# Patient Record
Sex: Female | Born: 1960 | Race: White | Hispanic: No | Marital: Married | Smoking: Current every day smoker
Health system: Southern US, Community
[De-identification: ages and names within clinical notes are randomized; demographics above are authoritative.]

## PROBLEM LIST (undated history)

## (undated) DIAGNOSIS — J189 Pneumonia, unspecified organism: Secondary | ICD-10-CM

## (undated) DIAGNOSIS — E118 Type 2 diabetes mellitus with unspecified complications: Secondary | ICD-10-CM

## (undated) DIAGNOSIS — K7460 Unspecified cirrhosis of liver: Secondary | ICD-10-CM

## (undated) DIAGNOSIS — R7989 Other specified abnormal findings of blood chemistry: Secondary | ICD-10-CM

## (undated) DIAGNOSIS — E785 Hyperlipidemia, unspecified: Secondary | ICD-10-CM

## (undated) DIAGNOSIS — I10 Essential (primary) hypertension: Secondary | ICD-10-CM

## (undated) DIAGNOSIS — R945 Abnormal results of liver function studies: Secondary | ICD-10-CM

## (undated) DIAGNOSIS — J449 Chronic obstructive pulmonary disease, unspecified: Secondary | ICD-10-CM

## (undated) DIAGNOSIS — M5030 Other cervical disc degeneration, unspecified cervical region: Secondary | ICD-10-CM

## (undated) DIAGNOSIS — Z720 Tobacco use: Secondary | ICD-10-CM

## (undated) DIAGNOSIS — K760 Fatty (change of) liver, not elsewhere classified: Secondary | ICD-10-CM

## (undated) DIAGNOSIS — Z87442 Personal history of urinary calculi: Secondary | ICD-10-CM

## (undated) DIAGNOSIS — N189 Chronic kidney disease, unspecified: Secondary | ICD-10-CM

## (undated) DIAGNOSIS — R51 Headache: Secondary | ICD-10-CM

## (undated) DIAGNOSIS — I700 Atherosclerosis of aorta: Secondary | ICD-10-CM

## (undated) DIAGNOSIS — G5600 Carpal tunnel syndrome, unspecified upper limb: Secondary | ICD-10-CM

## (undated) DIAGNOSIS — R519 Headache, unspecified: Secondary | ICD-10-CM

## (undated) DIAGNOSIS — I779 Disorder of arteries and arterioles, unspecified: Secondary | ICD-10-CM

## (undated) DIAGNOSIS — G4730 Sleep apnea, unspecified: Secondary | ICD-10-CM

## (undated) DIAGNOSIS — G43909 Migraine, unspecified, not intractable, without status migrainosus: Secondary | ICD-10-CM

## (undated) DIAGNOSIS — I2510 Atherosclerotic heart disease of native coronary artery without angina pectoris: Secondary | ICD-10-CM

## (undated) DIAGNOSIS — R7301 Impaired fasting glucose: Secondary | ICD-10-CM

## (undated) DIAGNOSIS — K219 Gastro-esophageal reflux disease without esophagitis: Secondary | ICD-10-CM

## (undated) DIAGNOSIS — Z7982 Long term (current) use of aspirin: Secondary | ICD-10-CM

## (undated) DIAGNOSIS — E119 Type 2 diabetes mellitus without complications: Secondary | ICD-10-CM

## (undated) DIAGNOSIS — G4733 Obstructive sleep apnea (adult) (pediatric): Secondary | ICD-10-CM

## (undated) DIAGNOSIS — M1990 Unspecified osteoarthritis, unspecified site: Secondary | ICD-10-CM

## (undated) DIAGNOSIS — J45909 Unspecified asthma, uncomplicated: Secondary | ICD-10-CM

## (undated) HISTORY — DX: Tobacco use: Z72.0

## (undated) HISTORY — DX: Hyperlipidemia, unspecified: E78.5

## (undated) HISTORY — DX: Other specified abnormal findings of blood chemistry: R79.89

## (undated) HISTORY — PX: DILATION AND CURETTAGE OF UTERUS: SHX78

## (undated) HISTORY — DX: Chronic obstructive pulmonary disease, unspecified: J44.9

## (undated) HISTORY — PX: CARPAL TUNNEL RELEASE: SHX101

## (undated) HISTORY — DX: Impaired fasting glucose: R73.01

## (undated) HISTORY — DX: Type 2 diabetes mellitus without complications: E11.9

## (undated) HISTORY — DX: Carpal tunnel syndrome, unspecified upper limb: G56.00

## (undated) HISTORY — DX: Essential (primary) hypertension: I10

## (undated) HISTORY — PX: TONSILLECTOMY: SUR1361

## (undated) HISTORY — DX: Unspecified asthma, uncomplicated: J45.909

## (undated) HISTORY — DX: Abnormal results of liver function studies: R94.5

## (undated) HISTORY — PX: PALATE / UVULA BIOPSY / EXCISION: SUR128

## (undated) HISTORY — DX: Gastro-esophageal reflux disease without esophagitis: K21.9

## (undated) HISTORY — DX: Sleep apnea, unspecified: G47.30

---

## 2003-09-09 ENCOUNTER — Other Ambulatory Visit: Payer: Self-pay

## 2003-12-12 ENCOUNTER — Other Ambulatory Visit: Payer: Self-pay

## 2004-04-22 ENCOUNTER — Emergency Department: Payer: Self-pay | Admitting: Emergency Medicine

## 2004-07-31 ENCOUNTER — Emergency Department: Payer: Self-pay | Admitting: General Practice

## 2004-08-22 ENCOUNTER — Ambulatory Visit: Payer: Self-pay

## 2004-12-08 ENCOUNTER — Emergency Department: Payer: Self-pay | Admitting: Emergency Medicine

## 2005-01-08 ENCOUNTER — Ambulatory Visit: Payer: Self-pay

## 2005-08-10 ENCOUNTER — Other Ambulatory Visit: Payer: Self-pay

## 2005-08-10 ENCOUNTER — Emergency Department: Payer: Self-pay | Admitting: Emergency Medicine

## 2005-09-18 ENCOUNTER — Ambulatory Visit: Payer: Self-pay | Admitting: Gastroenterology

## 2005-10-01 ENCOUNTER — Ambulatory Visit: Payer: Self-pay | Admitting: Gastroenterology

## 2005-12-12 ENCOUNTER — Encounter: Payer: Self-pay | Admitting: *Deleted

## 2005-12-17 ENCOUNTER — Ambulatory Visit: Payer: Self-pay | Admitting: Specialist

## 2005-12-23 ENCOUNTER — Encounter: Payer: Self-pay | Admitting: *Deleted

## 2006-09-11 ENCOUNTER — Emergency Department: Payer: Self-pay | Admitting: Internal Medicine

## 2007-02-18 ENCOUNTER — Emergency Department: Payer: Self-pay | Admitting: Emergency Medicine

## 2007-02-19 ENCOUNTER — Other Ambulatory Visit: Payer: Self-pay

## 2007-10-25 ENCOUNTER — Emergency Department: Payer: Self-pay | Admitting: Emergency Medicine

## 2008-12-08 ENCOUNTER — Emergency Department: Payer: Self-pay | Admitting: Emergency Medicine

## 2009-09-05 ENCOUNTER — Ambulatory Visit: Payer: Self-pay | Admitting: Internal Medicine

## 2011-11-15 ENCOUNTER — Ambulatory Visit: Payer: Self-pay | Admitting: Family Medicine

## 2013-09-05 ENCOUNTER — Emergency Department: Payer: Self-pay | Admitting: Emergency Medicine

## 2013-12-21 ENCOUNTER — Emergency Department: Payer: Self-pay | Admitting: Emergency Medicine

## 2014-07-13 ENCOUNTER — Emergency Department: Payer: Self-pay | Admitting: Emergency Medicine

## 2014-09-17 ENCOUNTER — Emergency Department: Payer: Self-pay

## 2014-10-04 DIAGNOSIS — F17219 Nicotine dependence, cigarettes, with unspecified nicotine-induced disorders: Secondary | ICD-10-CM | POA: Insufficient documentation

## 2014-10-04 DIAGNOSIS — E785 Hyperlipidemia, unspecified: Secondary | ICD-10-CM | POA: Insufficient documentation

## 2014-10-04 DIAGNOSIS — F17200 Nicotine dependence, unspecified, uncomplicated: Secondary | ICD-10-CM

## 2014-10-04 DIAGNOSIS — G4730 Sleep apnea, unspecified: Secondary | ICD-10-CM | POA: Insufficient documentation

## 2014-10-04 DIAGNOSIS — J45909 Unspecified asthma, uncomplicated: Secondary | ICD-10-CM | POA: Insufficient documentation

## 2014-10-04 DIAGNOSIS — J449 Chronic obstructive pulmonary disease, unspecified: Secondary | ICD-10-CM | POA: Insufficient documentation

## 2014-10-04 DIAGNOSIS — R7301 Impaired fasting glucose: Secondary | ICD-10-CM | POA: Insufficient documentation

## 2014-10-04 DIAGNOSIS — K219 Gastro-esophageal reflux disease without esophagitis: Secondary | ICD-10-CM | POA: Insufficient documentation

## 2014-10-04 DIAGNOSIS — I10 Essential (primary) hypertension: Secondary | ICD-10-CM | POA: Insufficient documentation

## 2014-10-15 ENCOUNTER — Ambulatory Visit
Admit: 2014-10-15 | Disposition: A | Discharge status: Home / Self Care | Payer: Self-pay | Attending: Gastroenterology | Admitting: Gastroenterology

## 2014-10-15 LAB — HM COLONOSCOPY

## 2014-10-20 ENCOUNTER — Ambulatory Visit (INDEPENDENT_AMBULATORY_CARE_PROVIDER_SITE_OTHER): Payer: 59 | Admitting: Cardiovascular Disease

## 2014-10-20 ENCOUNTER — Encounter (INDEPENDENT_AMBULATORY_CARE_PROVIDER_SITE_OTHER): Payer: Self-pay

## 2014-10-20 ENCOUNTER — Encounter: Payer: Self-pay | Admitting: Cardiovascular Disease

## 2014-10-20 VITALS — BP 120/88 | HR 72 | Ht 62.0 in | Wt 189.8 lb

## 2014-10-20 DIAGNOSIS — R0602 Shortness of breath: Secondary | ICD-10-CM | POA: Diagnosis not present

## 2014-10-20 DIAGNOSIS — G4730 Sleep apnea, unspecified: Secondary | ICD-10-CM

## 2014-10-20 DIAGNOSIS — I10 Essential (primary) hypertension: Secondary | ICD-10-CM | POA: Diagnosis not present

## 2014-10-20 DIAGNOSIS — J438 Other emphysema: Secondary | ICD-10-CM | POA: Diagnosis not present

## 2014-10-20 DIAGNOSIS — R079 Chest pain, unspecified: Secondary | ICD-10-CM | POA: Diagnosis not present

## 2014-10-20 DIAGNOSIS — K219 Gastro-esophageal reflux disease without esophagitis: Secondary | ICD-10-CM

## 2014-10-20 DIAGNOSIS — Z720 Tobacco use: Secondary | ICD-10-CM

## 2014-10-20 DIAGNOSIS — R7301 Impaired fasting glucose: Secondary | ICD-10-CM

## 2014-10-20 NOTE — Assessment & Plan Note (Signed)
She reports stable shortness of breath, only mild symptoms. Chronic cough

## 2014-10-20 NOTE — Assessment & Plan Note (Signed)
She reports having significant snoring, daytime somnolence, fatigue in the morning. Currently CPAP is not covered in she reports that primary care is working on her paperwork. This will likely help her fatigue

## 2014-10-20 NOTE — Assessment & Plan Note (Signed)
Recommended that she add Pepcid or Zantac in addition to her occasional Tums for breakthrough GERD symptoms. She is on Protonix daily

## 2014-10-20 NOTE — Assessment & Plan Note (Signed)
Recommended smoking cessation. She will consider Chantix

## 2014-10-20 NOTE — Assessment & Plan Note (Signed)
Atypical chest pain. She denies any significant pain with exertion. She feels her chest discomfort is secondary to breakthrough GERD symptoms. We spent a long time talking about anginal symptoms and what to watch for. She will contact us if she has any change in her stamina or if she gets exertional symptoms

## 2014-10-20 NOTE — Assessment & Plan Note (Signed)
Blood pressure is well controlled on today's visit. No changes made to the medications. 

## 2014-10-20 NOTE — Assessment & Plan Note (Signed)
We have encouraged continued exercise, careful diet management in an effort to lose weight. 

## 2014-10-20 NOTE — Progress Notes (Signed)
Patient ID: Monica Hull, female    DOB: 1961-06-17, 54 y.o.   MRN: [id number]  HPI Comments: Monica Hull is a 54 year old woman with history of smoking, GERD, COPD, hypertension who presents for symptoms of chest pain and syncope.  She reports that since 2000, she has had rare episodes of syncope. Symptoms typically present with nausea of uncertain etiology, followed by lightheadedness and syncope. Husband presents with her today and reports that she typically passes out for 5-10 minutes at a time. Lips go blue, she is limp. Eventually she comes to. She denies any significant workup for symptoms in the past. Each of these episodes typically preceded by nausea. She denies any vomiting. Last episode was several months ago.  Also has breakthrough GERD symptoms. Previously was on Prevacid, changed to [employer] for insurance reasons. On this regimen she continues to have breakthrough reflux. Sometimes takes extra Tums. Symptoms present at rest, feel like acid. She reports having recent EGD.  "They did not find anything"  In general she is able to walk fast without symptoms of shortness of breath or chest pain.  Husband reports that she "out-walks him "  She continues to smoke at least one pack per day. She has tried Wellbutrin, considering Chantix, tried nicotine patches  EKG on today's visit shows normal sinus rhythm with rate 72 bpm, no significant ST or T-wave changes   Allergies  Allergen Reactions  . Biaxin [Clarithromycin] Nausea And Vomiting and Rash    Outpatient Encounter Prescriptions as of 10/20/2014  Medication Sig  . diclofenac (VOLTAREN) 75 MG EC tablet Take 75 mg by mouth 2 (two) times daily as needed.  Marland Kitchen lisinopril (PRINIVIL,ZESTRIL) 5 MG tablet Take 5 mg by mouth daily.  . pantoprazole (PROTONIX) 40 MG tablet Take 40 mg by mouth daily.  . [DISCONTINUED] buPROPion (WELLBUTRIN XL) 150 MG 24 hr tablet Take 150 mg by mouth 2 (two) times daily.    Past Medical History   Diagnosis Date  . Asthma   . GERD (gastroesophageal reflux disease)   . Hyperlipidemia   . Hypertension   . Impaired fasting glucose   . COPD (chronic obstructive pulmonary disease)   . Tobacco abuse   . Elevated liver function tests   . Carpal tunnel syndrome   . Sleep apnea     Not using CPAP- referred to sleep center    Past Surgical History  Procedure Laterality Date  . Cesarean section      X2  . Carpal tunnel release      Social History  reports that she has been smoking Cigarettes.  She has a 78 pack-year smoking history. She does not have any smokeless tobacco history on file. She reports that she drinks alcohol. She reports that she does not use illicit drugs.  Family History family history includes Alcohol abuse in her father; Arthritis in her mother; Asthma in her mother; Diabetes in her brother and mother; Heart disease in her mother; Hyperlipidemia in her father and mother; Hypertension in her father and mother; Kidney disease in her mother; Lung disease in her mother.   Review of Systems  Constitutional: Negative.   Respiratory: Positive for shortness of breath.   Cardiovascular: Positive for chest pain.  Gastrointestinal: Negative.   Musculoskeletal: Negative.   Skin: Negative.   Neurological: Negative.   Hematological: Negative.   Psychiatric/Behavioral: Negative.   All other systems reviewed and are negative.   BP 120/88 mmHg  Pulse 72  Ht  (1.575 m)  Wt 189 lb 12 oz (86.07 kg)  BMI 34.70 kg/m2  Physical Exam  Constitutional: She is oriented to person, place, and time. She appears well-developed and well-nourished.  HENT:  Head: Normocephalic.  Nose: Nose normal.  Mouth/Throat: Oropharynx is clear and moist.  Eyes: Conjunctivae are normal. Pupils are equal, round, and reactive to light.  Neck: Normal range of motion. Neck supple. No JVD present.  Cardiovascular: Normal rate, regular rhythm, S1 normal, S2 normal, normal heart sounds and  intact distal pulses.  Exam reveals no gallop and no friction rub.   No murmur heard. Pulmonary/Chest: Effort normal and breath sounds normal. No respiratory distress. She has no wheezes. She has no rales. She exhibits no tenderness.  Abdominal: Soft. Bowel sounds are normal. She exhibits no distension. There is no tenderness.  Musculoskeletal: Normal range of motion. She exhibits no edema or tenderness.  Lymphadenopathy:    She has no cervical adenopathy.  Neurological: She is alert and oriented to person, place, and time. Coordination normal.  Skin: Skin is warm and dry. No rash noted. No erythema.  Psychiatric: She has a normal mood and affect. Her behavior is normal. Judgment and thought content normal.    Assessment and Plan  Nursing note and vitals reviewed.

## 2014-10-20 NOTE — Patient Instructions (Signed)
You are doing well. No medication changes were made.  Please call for any chest pains or shortness of breath, especially with exertion  Try the ranitidine or famotidine 1 to 2 pills with tums for heartburn OK to take with protonix  When you get nausea, Lay flat on the ground, hydrate  Please call us if you have new issues that need to be addressed before your next appt.  Your physician wants you to follow-up in: 12 months.  You will receive a reminder letter in the mail two months in advance. If you don't receive a letter, please call our office to schedule the follow-up appointment.

## 2014-11-30 ENCOUNTER — Telehealth: Payer: Self-pay | Admitting: Family Medicine

## 2014-11-30 NOTE — Telephone Encounter (Signed)
That is usually provided by her dentist. If they are having issues getting it from the dentist, let me know.

## 2014-11-30 NOTE — Telephone Encounter (Signed)
Pt's husband called stated pt is having teeth removed wants to know if Amoxic and Clavuln can be called in so the pt can have the work on her teeth completed. [facility] [place]. Thanks.

## 2014-12-01 NOTE — Telephone Encounter (Signed)
I called all numbers in the patients chart. I was unable to get through on either one of these. Will try again

## 2014-12-09 ENCOUNTER — Ambulatory Visit: Payer: Self-pay | Admitting: Family Medicine

## 2014-12-21 ENCOUNTER — Ambulatory Visit (INDEPENDENT_AMBULATORY_CARE_PROVIDER_SITE_OTHER): Payer: 59 | Admitting: Family Medicine

## 2014-12-21 ENCOUNTER — Encounter: Payer: Self-pay | Admitting: Family Medicine

## 2014-12-21 VITALS — BP 137/85 | HR 85 | Temp 97.9°F | Ht 62.5 in | Wt 187.8 lb

## 2014-12-21 DIAGNOSIS — I10 Essential (primary) hypertension: Secondary | ICD-10-CM | POA: Diagnosis not present

## 2014-12-21 DIAGNOSIS — E785 Hyperlipidemia, unspecified: Secondary | ICD-10-CM | POA: Diagnosis not present

## 2014-12-21 DIAGNOSIS — Z0000 Encounter for general adult medical examination without abnormal findings: Secondary | ICD-10-CM

## 2014-12-21 DIAGNOSIS — Z23 Encounter for immunization: Secondary | ICD-10-CM

## 2014-12-21 DIAGNOSIS — N393 Stress incontinence (female) (male): Secondary | ICD-10-CM | POA: Diagnosis not present

## 2014-12-21 DIAGNOSIS — J438 Other emphysema: Secondary | ICD-10-CM | POA: Diagnosis not present

## 2014-12-21 DIAGNOSIS — Z720 Tobacco use: Secondary | ICD-10-CM

## 2014-12-21 DIAGNOSIS — G4730 Sleep apnea, unspecified: Secondary | ICD-10-CM | POA: Diagnosis not present

## 2014-12-21 DIAGNOSIS — J441 Chronic obstructive pulmonary disease with (acute) exacerbation: Secondary | ICD-10-CM | POA: Insufficient documentation

## 2014-12-21 DIAGNOSIS — R7301 Impaired fasting glucose: Secondary | ICD-10-CM

## 2014-12-21 DIAGNOSIS — K219 Gastro-esophageal reflux disease without esophagitis: Secondary | ICD-10-CM

## 2014-12-21 LAB — UA/M W/RFLX CULTURE, ROUTINE
BILIRUBIN UA: NEGATIVE
Glucose, UA: NEGATIVE
KETONES UA: NEGATIVE
LEUKOCYTES UA: NEGATIVE
Nitrite, UA: NEGATIVE
PH UA: 5.5 (ref 5.0–7.5)
PROTEIN UA: NEGATIVE
RBC, UA: NEGATIVE
Specific Gravity, UA: 1.02 (ref 1.005–1.030)
Urobilinogen, Ur: 1 mg/dL (ref 0.2–1.0)

## 2014-12-21 LAB — MICROALBUMIN, URINE WAIVED
Creatinine, Urine Waived: 200 mg/dL (ref 10–300)
Microalb, Ur Waived: 30 mg/L — ABNORMAL HIGH (ref 0–19)

## 2014-12-21 LAB — HM PAP SMEAR: HM PAP: NEGATIVE

## 2014-12-21 MED ORDER — PREDNISONE 10 MG PO TABS: ORAL_TABLET | ORAL | Status: DC

## 2014-12-21 MED ORDER — AZITHROMYCIN 250 MG PO TABS: ORAL_TABLET | ORAL | Status: DC

## 2014-12-21 NOTE — Assessment & Plan Note (Signed)
Under good control today. Continue current regimen. Continue to monitor. CMP and microalbumin checked today.

## 2014-12-21 NOTE — Assessment & Plan Note (Signed)
Wear mask. Follow up with sleep doctor.

## 2014-12-21 NOTE — Assessment & Plan Note (Signed)
A1c checked today. CMP checked today. Await results. Continue diet and exercise.

## 2014-12-21 NOTE — Assessment & Plan Note (Signed)
Likely due to post-menopausal driness. Referral to GYN made today. Work on [employer]. Keep bladder empty. Timed voiding. Continue to monitor.

## 2014-12-21 NOTE — Assessment & Plan Note (Signed)
Has not been able to get her chantix. Check with insurance. Continue to monitor.

## 2014-12-21 NOTE — Assessment & Plan Note (Signed)
In exacerbation today. Will treat with azithromycin and prednisone. Return for recheck on lungs in 2 weeks. Will need a spiro at that time.

## 2014-12-21 NOTE — Assessment & Plan Note (Signed)
Under fair control. Continue current regimen. Continue to monitor.  

## 2014-12-21 NOTE — Assessment & Plan Note (Signed)
Lipid panel checked today. Await results. Continue to monitor.

## 2014-12-21 NOTE — Progress Notes (Signed)
BP 137/85 mmHg  Pulse 85  Temp(Src) 97.9 F (36.6 C) (Oral)  Ht 5' 2.5" (1.588 m)  Wt 187 lb 12.8 oz (85.186 kg)  BMI 33.78 kg/m2  SpO2 98%   Subjective:    Patient ID: Ok Monica Hull, female    DOB: 1961/03/24, 54 y.o.   MRN: [id number]  HPI: Monica Hull is a 54 y.o. female presenting on 12/21/2014 for comprehensive medical examination. Current medical complaints include:  Cough for a couple of weeks Worst symptom: cough Fever: no Cough: yes Shortness of breath: yes Wheezing: yes Chest pain: yes, with cough Chest tightness: no Chest congestion: no Nasal congestion: no Runny nose: no Post nasal drip: no Sneezing: no Sore throat: no Swollen glands: no Sinus pressure: no Headache: no Face pain: no Toothache: no Ear pain: no  Ear pressure: no  Eyes red/itching:no Eye drainage/crusting: no  Vomiting: no Rash: no Fatigue: no Sick contacts: no Strep contacts: no  Context: stable Recurrent sinusitis: no Relief with OTC cold/cough medications: no  Treatments attempted: cough syrup   Urinary incontience- loses control with coughing and sneezing, no loss of control when she's sitting, will leak when she stands up Dysuria: no Urinary frequency: yes Urgency: yes Small volume voids: no Symptom severity: severe Urinary incontinence: yes Foul odor: no Hematuria: no Abdominal pain: no Back pain: no Suprapubic pain/pressure: yes Flank pain: no Fever:  no Vomiting: no  She currently lives with: Her husband Menopausal Symptoms: yes- hot flashes  Depression Screen done today and results listed below:  Depression screen [facility] 2/9 12/21/2014  Decreased Interest 1  Down, Depressed, Hopeless 0  PHQ - 2 Score 1    The patient does not have a history of falls. I did not complete a risk assessment for falls. A plan of care for falls was not documented.   Past Medical History:  Past Medical History  Diagnosis Date  . Asthma   . GERD (gastroesophageal  reflux disease)   . Hyperlipidemia   . Hypertension   . Impaired fasting glucose   . COPD (chronic obstructive pulmonary disease)   . Tobacco abuse   . Elevated liver function tests   . Carpal tunnel syndrome   . Sleep apnea     Not using CPAP- referred to sleep center  . Diabetes mellitus without complication     Surgical History:  Past Surgical History  Procedure Laterality Date  . Cesarean section      X2  . Carpal tunnel release      Medications:  Current Outpatient Prescriptions on File Prior to Visit  Medication Sig  . diclofenac (VOLTAREN) 75 MG EC tablet Take 75 mg by mouth 2 (two) times daily as needed.  Marland Kitchen lisinopril (PRINIVIL,ZESTRIL) 5 MG tablet Take 5 mg by mouth daily.  . pantoprazole (PROTONIX) 40 MG tablet Take 40 mg by mouth daily.  . varenicline (CHANTIX PAK) 0.5 MG X 11 & 1 MG X 42 tablet Take by mouth 2 (two) times daily. Take one 0.5 mg tablet by mouth once daily for 3 days, then increase to one 0.5 mg tablet twice daily for 4 days, then increase to one 1 mg tablet twice daily.  . varenicline (CHANTIX) 1 MG tablet Take 1 mg by mouth 2 (two) times daily.   No current facility-administered medications on file prior to visit.    Allergies:  Allergies  Allergen Reactions  . Biaxin [Clarithromycin] Nausea And Vomiting and Rash    Social History:  History  Social History  . Marital Status: Married    Spouse Name: N/A  . Number of Children: N/A  . Years of Education: N/A   Occupational History  . Not on file.   Social History Main Topics  . Smoking status: Current Every Day Smoker -- 2.00 packs/day for 39 years    Types: Cigarettes  . Smokeless tobacco: Never Used  . Alcohol Use: No  . Drug Use: No  . Sexual Activity:    Partners: Male   Other Topics Concern  . Not on file   Social History Narrative   History  Smoking status  . Current Every Day Smoker -- 2.00 packs/day for 39 years  . Types: Cigarettes  Smokeless tobacco  . Never  Used   History  Alcohol Use No    Family History:  Family History  Problem Relation Age of Onset  . Arthritis Mother   . Asthma Mother   . Diabetes Mother   . Heart disease Mother   . Hyperlipidemia Mother   . Hypertension Mother   . Kidney disease Mother   . Lung disease Mother   . Alcohol abuse Father   . Hypertension Father   . Hyperlipidemia Father   . Diabetes Brother     Past medical history, surgical history, medications, allergies, family history and social history reviewed with patient today and changes made to appropriate areas of the chart.   Review of Systems  Constitutional: Positive for weight loss and malaise/fatigue. Negative for fever, chills and diaphoresis.  HENT: Positive for congestion. Negative for ear discharge, ear pain, hearing loss, nosebleeds, sore throat and tinnitus.   Eyes: Negative for blurred vision, double vision, photophobia, pain, discharge and redness.  Respiratory: Positive for cough, shortness of breath and wheezing. Negative for hemoptysis, sputum production and stridor.   Cardiovascular: Positive for chest pain. Negative for palpitations, orthopnea, claudication, leg swelling and PND.  Gastrointestinal: Positive for heartburn, diarrhea, constipation and blood in stool. Negative for nausea, vomiting, abdominal pain and melena.       Had a colonoscopy within the year. [facility]   Genitourinary: Positive for urgency and frequency. Negative for dysuria, hematuria and flank pain.  Musculoskeletal: Positive for myalgias and joint pain. Negative for back pain, falls and neck pain.  Skin: Negative for itching and rash.       Has a new mole on her ankle  Neurological: Negative for dizziness, tingling, tremors, sensory change, speech change, focal weakness, seizures, loss of consciousness, weakness and headaches.  Endo/Heme/Allergies: Negative for environmental allergies and polydipsia. Does not bruise/bleed easily.   Psychiatric/Behavioral: Negative for depression, suicidal ideas, hallucinations, memory loss and substance abuse. The patient is not nervous/anxious and does not have insomnia.     All other ROS negative except what is listed above and in the HPI.      Objective:    BP 137/85 mmHg  Pulse 85  Temp(Src) 97.9 F (36.6 C) (Oral)  Ht 5' 2.5" (1.588 m)  Wt 187 lb 12.8 oz (85.186 kg)  BMI 33.78 kg/m2  SpO2 98%  Wt Readings from Last 3 Encounters:  12/21/14 187 lb 12.8 oz (85.186 kg)  11/18/14 190 lb (86.183 kg)  10/20/14 189 lb 12 oz (86.07 kg)    Physical Exam  Constitutional: She is oriented to person, place, and time. She appears well-developed and well-nourished. No distress.  HENT:  Head: Normocephalic and atraumatic.  Right Ear: Hearing and external ear normal.  Left Ear: Hearing  and external ear normal.  Nose: Nose normal.  Mouth/Throat: Oropharynx is clear and moist. No oropharyngeal exudate.  Eyes: Conjunctivae and lids are normal. Pupils are equal, round, and reactive to light. Right eye exhibits no discharge. Left eye exhibits no discharge. No scleral icterus.  Neck: Normal range of motion. Neck supple. No JVD present. No tracheal deviation present. No thyromegaly present.  Cardiovascular: Normal rate, regular rhythm, normal heart sounds and intact distal pulses.  Exam reveals no gallop and no friction rub.   No murmur heard. Pulmonary/Chest: Effort normal. No accessory muscle usage or stridor. No apnea, no tachypnea and no bradypnea. No respiratory distress. She has wheezes. She has rhonchi in the right upper field, the right middle field, the right lower field, the left upper field, the left middle field and the left lower field. She has no rales. Right breast exhibits no inverted nipple, no mass, no nipple discharge, no skin change and no tenderness. Left breast exhibits no inverted nipple, no mass, no nipple discharge, no skin change and no tenderness. Breasts are  symmetrical.  Abdominal: Soft. Bowel sounds are normal. She exhibits no distension and no mass. There is no tenderness. There is no rebound and no guarding.  Genitourinary: Vagina normal and uterus normal. No breast swelling, tenderness, discharge or bleeding. No labial fusion. There is no rash, tenderness, lesion or injury on the right labia. There is no rash, tenderness, lesion or injury on the left labia. Uterus is not deviated, not enlarged, not fixed and not tender. Cervix exhibits no motion tenderness, no discharge and no friability. Right adnexum displays no mass, no tenderness and no fullness. Left adnexum displays no mass, no tenderness and no fullness. No erythema, tenderness or bleeding in the vagina. No vaginal discharge found.  Musculoskeletal: Normal range of motion. She exhibits no edema or tenderness.  Neurological: She is alert and oriented to person, place, and time. She has normal reflexes. She displays normal reflexes. No cranial nerve deficit. She exhibits normal muscle tone. Coordination normal.  Skin: Skin is warm, dry and intact. No rash noted. She is not diaphoretic. No erythema. No pallor.  Psychiatric: She has a normal mood and affect. Her speech is normal and behavior is normal. Judgment and thought content normal. Cognition and memory are normal.  Nursing note and vitals reviewed.   No results found for this or any previous visit.    Assessment & Plan:   Problem List Items Addressed This Visit      Cardiovascular and Mediastinum   Hypertension    Under good control today. Continue current regimen. Continue to monitor. CMP and microalbumin checked today.       Relevant Orders   CBC With Differential/Platelet   Comprehensive metabolic panel   Hgb A1c w/o eAG   Lipid Panel w/o Chol/HDL Ratio   Microalbumin, Urine Waived   TSH   UA/M w/rflx Culture, Routine     Respiratory   COPD (chronic obstructive pulmonary disease)    In exacerbation today. Will treat with  azithromycin and prednisone. Return for recheck on lungs in 2 weeks. Will need a spiro at that time.       Relevant Medications   predniSONE (DELTASONE) 10 MG tablet   azithromycin (ZITHROMAX) 250 MG tablet   COPD exacerbation   Relevant Medications   predniSONE (DELTASONE) 10 MG tablet   azithromycin (ZITHROMAX) 250 MG tablet     Digestive   GERD (gastroesophageal reflux disease)    Under fair control. Continue current  regimen. Continue to monitor.       Relevant Orders   CBC With Differential/Platelet   Comprehensive metabolic panel   Hgb A1c w/o eAG   Lipid Panel w/o Chol/HDL Ratio   Microalbumin, Urine Waived   TSH   UA/M w/rflx Culture, Routine     Endocrine   Impaired fasting glucose    A1c checked today. CMP checked today. Await results. Continue diet and exercise.       Relevant Orders   CBC With Differential/Platelet   Comprehensive metabolic panel   Hgb A1c w/o eAG   Lipid Panel w/o Chol/HDL Ratio   Microalbumin, Urine Waived   TSH   UA/M w/rflx Culture, Routine     Other   Sleep apnea    Wear mask. Follow up with sleep doctor.       Hyperlipidemia    Lipid panel checked today. Await results. Continue to monitor.       Relevant Orders   CBC With Differential/Platelet   Comprehensive metabolic panel   Hgb A1c w/o eAG   Lipid Panel w/o Chol/HDL Ratio   Microalbumin, Urine Waived   TSH   UA/M w/rflx Culture, Routine   Tobacco abuse    Has not been able to get her chantix. Check with insurance. Continue to monitor.       Relevant Orders   CBC With Differential/Platelet   Comprehensive metabolic panel   Hgb A1c w/o eAG   Lipid Panel w/o Chol/HDL Ratio   Microalbumin, Urine Waived   TSH   UA/M w/rflx Culture, Routine   Stress incontinence    Likely due to post-menopausal driness. Referral to GYN made today. Work on [employer]. Keep bladder empty. Timed voiding. Continue to monitor.       Relevant Orders   Ambulatory referral to Gynecology     Other Visit Diagnoses    Routine general medical examination at a health care facility    -  Primary    Doing well. Mammo done. Conolonoscopy done. Up to date on vaccines. Screening labs checked today. Pap done today. Work on diet and exercise.     Relevant Orders    CBC With Differential/Platelet    Comprehensive metabolic panel    Hgb A1c w/o eAG    Lipid Panel w/o Chol/HDL Ratio    Microalbumin, Urine Waived    TSH    UA/M w/rflx Culture, Routine    Pap liquid-based and HPV (high risk)    Immunization due        Tdap given today.     Relevant Orders    Tdap vaccine greater than or equal to 7yo IM (Completed)        Follow up plan: Return in about 2 weeks (around 01/04/2015).   LABORATORY TESTING:  - Pap smear: pap done  IMMUNIZATIONS:   - Tdap: Tetanus vaccination status reviewed: Td vaccination indicated and given today. - Influenza: Postponed to flu season - Pneumovax: Up to date  SCREENING: -Mammogram: Up to date  - Colonoscopy: Up to date   PATIENT COUNSELING:   Advised to take 1 mg of folate supplement per day if capable of pregnancy.   Sexuality: Discussed sexually transmitted diseases, partner selection, use of condoms, avoidance of unintended pregnancy  and contraceptive alternatives.   Advised to avoid cigarette smoking.  I discussed with the patient that most people either abstain from alcohol or drink within safe limits (<=14/week and <=4 drinks/occasion for males, <=7/weeks and <= 3 drinks/occasion for females) and that  the risk for alcohol disorders and other health effects rises proportionally with the number of drinks per week and how often a drinker exceeds daily limits.  Discussed cessation/primary prevention of drug use and availability of treatment for abuse.   Diet: Encouraged to adjust caloric intake to maintain  or achieve ideal body weight, to reduce intake of dietary saturated fat and total fat, to limit sodium intake by avoiding high sodium  foods and not adding table salt, and to maintain adequate dietary potassium and calcium preferably from fresh fruits, vegetables, and low-fat dairy products.    stressed the importance of regular exercise  Injury prevention: Discussed safety belts, safety helmets, smoke detector, smoking near bedding or upholstery.   Dental health: Discussed importance of regular tooth brushing, flossing, and dental visits.    NEXT PREVENTATIVE PHYSICAL DUE IN 1 YEAR. Return in about 2 weeks (around 01/04/2015).

## 2014-12-22 ENCOUNTER — Encounter: Payer: Self-pay | Admitting: Family Medicine

## 2014-12-22 ENCOUNTER — Telehealth: Payer: Self-pay

## 2014-12-22 LAB — COMPREHENSIVE METABOLIC PANEL
A/G RATIO: 1.5 (ref 1.1–2.5)
ALT: 36 IU/L — AB (ref 0–32)
AST: 29 IU/L (ref 0–40)
Albumin: 4.1 g/dL (ref 3.5–5.5)
Alkaline Phosphatase: 125 IU/L — ABNORMAL HIGH (ref 39–117)
BUN/Creatinine Ratio: 14 (ref 9–23)
BUN: 9 mg/dL (ref 6–24)
Bilirubin Total: 0.3 mg/dL (ref 0.0–1.2)
CALCIUM: 9.3 mg/dL (ref 8.7–10.2)
CHLORIDE: 100 mmol/L (ref 97–108)
CO2: 26 mmol/L (ref 18–29)
Creatinine, Ser: 0.66 mg/dL (ref 0.57–1.00)
GFR calc Af Amer: 116 mL/min/{1.73_m2} (ref >59)
GFR, EST NON AFRICAN AMERICAN: 100 mL/min/{1.73_m2} (ref >59)
GLUCOSE: 103 mg/dL — AB (ref 65–99)
Globulin, Total: 2.8 g/dL (ref 1.5–4.5)
POTASSIUM: 4.3 mmol/L (ref 3.5–5.2)
SODIUM: 140 mmol/L (ref 134–144)
Total Protein: 6.9 g/dL (ref 6.0–8.5)

## 2014-12-22 LAB — TSH: TSH: 1.43 u[IU]/mL (ref 0.450–4.500)

## 2014-12-22 LAB — LIPID PANEL W/O CHOL/HDL RATIO
CHOLESTEROL TOTAL: 183 mg/dL (ref 100–199)
HDL: 35 mg/dL — ABNORMAL LOW (ref >39)
LDL CALC: 103 mg/dL — AB (ref 0–99)
Triglycerides: 223 mg/dL — ABNORMAL HIGH (ref 0–149)
VLDL CHOLESTEROL CAL: 45 mg/dL — AB (ref 5–40)

## 2014-12-22 LAB — HGB A1C W/O EAG: Hgb A1c MFr Bld: 5.8 % — ABNORMAL HIGH (ref 4.8–5.6)

## 2014-12-22 NOTE — Telephone Encounter (Signed)
-----   Message from [name], DO sent at 12/21/2014  4:36 PM EDT ----- Can we check with [facility] about when her colonoscopy was? She can't remember. Thanks!

## 2014-12-22 NOTE — Telephone Encounter (Signed)
Patients last colonoscopy was April 22,2016, she was given  A 5 year clearance.

## 2014-12-24 LAB — CBC WITH DIFFERENTIAL/PLATELET
BASOS: 1 %
Basophils Absolute: 0.1 10*3/uL (ref 0.0–0.2)
Basophils Absolute: 0.1 10*3/uL (ref 0.0–0.2)
Basos: 1 %
EOS (ABSOLUTE): 0.4 10*3/uL (ref 0.0–0.4)
EOS (ABSOLUTE): 0.4 10*3/uL (ref 0.0–0.4)
EOS: 4 %
Eos: 4 %
HEMOGLOBIN: 15.8 g/dL (ref 11.1–15.9)
Hematocrit: 46.1 % (ref 34.0–46.6)
Hematocrit: 46.9 % — ABNORMAL HIGH (ref 34.0–46.6)
Hemoglobin: 15.6 g/dL (ref 11.1–15.9)
IMMATURE GRANS (ABS): 0 10*3/uL (ref 0.0–0.1)
Immature Grans (Abs): 0 10*3/uL (ref 0.0–0.1)
Immature Granulocytes: 0 %
Immature Granulocytes: 0 %
Lymphocytes Absolute: 3.6 10*3/uL — ABNORMAL HIGH (ref 0.7–3.1)
Lymphocytes Absolute: 3.3 10*3/uL — ABNORMAL HIGH (ref 0.7–3.1)
Lymphs: 36 %
Lymphs: 35 %
MCH: 30.9 pg (ref 26.6–33.0)
MCH: 31.1 pg (ref 26.6–33.0)
MCHC: 33.7 g/dL (ref 31.5–35.7)
MCHC: 33.8 g/dL (ref 31.5–35.7)
MCV: 92 fL (ref 79–97)
MCV: 92 fL (ref 79–97)
MONOS ABS: 0.4 10*3/uL (ref 0.1–0.9)
MONOS ABS: 0.5 10*3/uL (ref 0.1–0.9)
Monocytes: 5 %
Monocytes: 5 %
Neutrophils Absolute: 5.5 10*3/uL (ref 1.4–7.0)
Neutrophils Absolute: 5.4 10*3/uL (ref 1.4–7.0)
Neutrophils: 54 %
Neutrophils: 55 %
PLATELETS: 341 10*3/uL (ref 150–379)
Platelets: 328 10*3/uL (ref 150–379)
RBC: 5.01 x10E6/uL (ref 3.77–5.28)
RBC: 5.12 x10E6/uL (ref 3.77–5.28)
RDW: 13.8 % (ref 12.3–15.4)
RDW: 13.5 % (ref 12.3–15.4)
WBC: 10.1 10*3/uL (ref 3.4–10.8)
WBC: 9.6 10*3/uL (ref 3.4–10.8)

## 2014-12-24 LAB — PAP LB AND HPV HIGH-RISK: PAP Smear Comment: 0

## 2014-12-24 LAB — SPECIMEN STATUS REPORT

## 2015-01-04 ENCOUNTER — Ambulatory Visit (INDEPENDENT_AMBULATORY_CARE_PROVIDER_SITE_OTHER): Payer: 59 | Admitting: Family Medicine

## 2015-01-04 ENCOUNTER — Encounter: Payer: Self-pay | Admitting: Family Medicine

## 2015-01-04 VITALS — BP 149/91 | HR 64 | Temp 98.4°F | Wt 188.5 lb

## 2015-01-04 DIAGNOSIS — J441 Chronic obstructive pulmonary disease with (acute) exacerbation: Secondary | ICD-10-CM

## 2015-01-04 DIAGNOSIS — I10 Essential (primary) hypertension: Secondary | ICD-10-CM | POA: Diagnosis not present

## 2015-01-04 DIAGNOSIS — Z720 Tobacco use: Secondary | ICD-10-CM | POA: Diagnosis not present

## 2015-01-04 MED ORDER — LISINOPRIL 10 MG PO TABS: 10.0000 mg | ORAL_TABLET | Freq: Every day | ORAL | Status: DC

## 2015-01-04 NOTE — Assessment & Plan Note (Signed)
Elevated again today. Not better on recheck. Will increase lisinopril to  daily. Recheck in 1 month with BMP.

## 2015-01-04 NOTE — Patient Instructions (Signed)
You Can Quit Smoking If you are ready to quit smoking or are thinking about it, congratulations! You have chosen to help yourself be healthier and live longer! There are lots of different ways to quit smoking. Nicotine gum, nicotine patches, a nicotine inhaler, or nicotine nasal spray can help with physical craving. Hypnosis, support groups, and medicines help break the habit of smoking. TIPS TO GET OFF AND STAY OFF CIGARETTES  Learn to predict your moods. Do not let a bad situation be your excuse to have a cigarette. Some situations in your life might tempt you to have a cigarette.  Ask friends and co-workers not to smoke around you.  Make your home smoke-free.  Never have "just one" cigarette. It leads to wanting another and another. Remind yourself of your decision to quit.  On a card, make a list of your reasons for not smoking. Read it at least the same number of times a day as you have a cigarette. Tell yourself everyday, "I do not want to smoke. I choose not to smoke."  Ask someone at home or work to help you with your plan to quit smoking.  Have something planned after you eat or have a cup of coffee. Take a walk or get other exercise to perk you up. This will help to keep you from overeating.  Try a relaxation exercise to calm you down and decrease your stress. Remember, you may be tense and nervous the first two weeks after you quit. This will pass.  Find new activities to keep your hands busy. Play with a pen, coin, or rubber band. Doodle or draw things on paper.  Brush your teeth right after eating. This will help cut down the craving for the taste of tobacco after meals. You can try mouthwash too.  Try gum, breath mints, or diet candy to keep something in your mouth. IF YOU SMOKE AND WANT TO QUIT:  Do not stock up on cigarettes. Never buy a carton. Wait until one pack is finished before you buy another.  Never carry cigarettes with you at work or at home.  Keep cigarettes  as far away from you as possible. Leave them with someone else.  Never carry matches or a lighter with you.  Ask yourself, "Do I need this cigarette or is this just a reflex?"  Bet with someone that you can quit. Put cigarette money in a piggy bank every morning. If you smoke, you give up the money. If you do not smoke, by the end of the week, you keep the money.  Keep trying. It takes 21 days to change a habit!  Talk to your doctor about using medicines to help you quit. These include nicotine replacement gum, lozenges, or skin patches. Document Released: 04/07/2009 Document Revised: 09/03/2011 Document Reviewed: 04/07/2009 Exit[employer] Patient Information 2015 [employer]. This information is not intended to replace advice given to you by your health care provider. Make sure you discuss any questions you have with your health care provider.  

## 2015-01-04 NOTE — Assessment & Plan Note (Signed)
Doing better. Lungs clear. Continue regular regimen. Encouraged quitting smoking. Continue to monitor.

## 2015-01-04 NOTE — Progress Notes (Signed)
BP 149/91 mmHg  Pulse 64  Temp(Src) 98.4 F (36.9 C)  Wt 188 lb 8 oz (85.503 kg)  SpO2 98%   Subjective:    Patient ID: Monica Hull, female    DOB: [birth year], 54 y.o.   MRN: [id number]  HPI: Monica Hull is a 54 y.o. female  Chief Complaint  Patient presents with  . Follow-up   Follow up COPD exacerbation- feeling significantly better. Meds worked well. Breathing feels better.  Worst symptom: cough- resolved now Fever: no Cough: yes Shortness of breath: no Wheezing: no Chest pain: no Chest tightness: no Chest congestion: no Nasal congestion: no Runny nose: no Post nasal drip: no Sneezing: no Sore throat: no Swollen glands: no Sinus pressure: no Headache: no Face pain: no Toothache: yes Ear pain: no  Ear pressure: no  Eyes red/itching:no Eye drainage/crusting: no  Vomiting: no Rash: no Fatigue: no Sick contacts: no Strep contacts: no  Context: better Recurrent sinusitis: no  Relevant past medical, surgical, family and social history reviewed and updated as indicated. Interim medical history since our last visit reviewed. Allergies and medications reviewed and updated.  Review of Systems  Constitutional: Negative.   HENT: Positive for dental problem.   Respiratory: Negative.   Cardiovascular: Negative.   Psychiatric/Behavioral: Negative.    Per HPI unless specifically indicated above    Objective:    BP 149/91 mmHg  Pulse 64  Temp(Src) 98.4 F (36.9 C)  Wt 188 lb 8 oz (85.503 kg)  SpO2 98%  Wt Readings from Last 3 Encounters:  01/04/15 188 lb 8 oz (85.503 kg)  12/21/14 187 lb 12.8 oz (85.186 kg)  11/18/14 190 lb (86.183 kg)    Physical Exam  Constitutional: She is oriented to person, place, and time. She appears well-developed and well-nourished. No distress.  HENT:  Head: Normocephalic and atraumatic.  Right Ear: Hearing normal.  Left Ear: Hearing normal.  Nose: Nose normal.  Eyes: Conjunctivae and lids are normal. Right  eye exhibits no discharge. Left eye exhibits no discharge. No scleral icterus.  Cardiovascular: Normal rate, regular rhythm and intact distal pulses.  Exam reveals no gallop and no friction rub.   No murmur heard. Pulmonary/Chest: Effort normal and breath sounds normal. No respiratory distress. She has no wheezes. She has no rales. She exhibits no tenderness.  Musculoskeletal: Normal range of motion.  Neurological: She is alert and oriented to person, place, and time.  Skin: Skin is warm, dry and intact. No rash noted. No erythema. No pallor.  Psychiatric: She has a normal mood and affect. Her speech is normal and behavior is normal. Judgment and thought content normal. Cognition and memory are normal.  Nursing note and vitals reviewed.  Results for orders placed or performed in visit on 01/04/15  HM COLONOSCOPY  Result Value Ref Range   HM Colonoscopy Polyp- due in 5 years       Assessment & Plan:   Problem List Items Addressed This Visit      Cardiovascular and Mediastinum   Hypertension    Elevated again today. Not better on recheck. Will increase lisinopril to 10mg  daily. Recheck in 1 month with BMP.       Relevant Medications   lisinopril (PRINIVIL,ZESTRIL) 10 MG tablet     Respiratory   COPD exacerbation - Primary    Doing better. Lungs clear. Continue regular regimen. Encouraged quitting smoking. Continue to monitor.         Other   Tobacco abuse  Working on getting chantix approved. Will check back in 1 month after she starts her chantix to see how she is doing.           Follow up plan: Return in about 4 weeks (around 02/01/2015).

## 2015-01-04 NOTE — Assessment & Plan Note (Signed)
Working on getting chantix approved. Will check back in 1 month after she starts her chantix to see how she is doing.

## 2015-01-18 ENCOUNTER — Encounter: Payer: 59 | Admitting: Obstetrics and Gynecology

## 2015-02-01 ENCOUNTER — Telehealth: Payer: Self-pay | Admitting: Family Medicine

## 2015-02-01 ENCOUNTER — Ambulatory Visit
Admission: RE | Admit: 2015-02-01 | Discharge: 2015-02-01 | Disposition: A | Discharge status: Home / Self Care | Payer: 59 | Source: Ambulatory Visit | Attending: Family Medicine | Admitting: Family Medicine

## 2015-02-01 ENCOUNTER — Encounter: Payer: Self-pay | Admitting: Family Medicine

## 2015-02-01 ENCOUNTER — Ambulatory Visit (INDEPENDENT_AMBULATORY_CARE_PROVIDER_SITE_OTHER): Payer: 59 | Admitting: Family Medicine

## 2015-02-01 VITALS — BP 151/93 | HR 74 | Temp 97.7°F | Wt 192.8 lb

## 2015-02-01 DIAGNOSIS — M79602 Pain in left arm: Secondary | ICD-10-CM

## 2015-02-01 DIAGNOSIS — I10 Essential (primary) hypertension: Secondary | ICD-10-CM

## 2015-02-01 DIAGNOSIS — A64 Unspecified sexually transmitted disease: Secondary | ICD-10-CM | POA: Diagnosis not present

## 2015-02-01 DIAGNOSIS — M47812 Spondylosis without myelopathy or radiculopathy, cervical region: Secondary | ICD-10-CM | POA: Insufficient documentation

## 2015-02-01 DIAGNOSIS — M79605 Pain in left leg: Secondary | ICD-10-CM | POA: Diagnosis not present

## 2015-02-01 DIAGNOSIS — Z720 Tobacco use: Secondary | ICD-10-CM

## 2015-02-01 DIAGNOSIS — R202 Paresthesia of skin: Secondary | ICD-10-CM

## 2015-02-01 MED ORDER — VARENICLINE TARTRATE 1 MG PO TABS: 1.0000 mg | ORAL_TABLET | Freq: Two times a day (BID) | ORAL | Status: DC

## 2015-02-01 MED ORDER — LISINOPRIL 20 MG PO TABS: 20.0000 mg | ORAL_TABLET | Freq: Every day | ORAL | Status: DC

## 2015-02-01 NOTE — Assessment & Plan Note (Signed)
Decreased sensation on exam. Otherwise normal neurologic exam. ?nerve impingement. Will check x-ray of c-spine and await results. EKG done today looked normal today. Will consider neurolepics vs. PT pending results of her x-ray.

## 2015-02-01 NOTE — Patient Instructions (Signed)

## 2015-02-01 NOTE — Telephone Encounter (Signed)
Called Monica Hull to give her the results of her x-ray. Shows quite a bit of arthritis, likely causing the numbness, tingling and pain. Will refer her to PT for benefit and will get MRI to better know what's going with the neck.

## 2015-02-01 NOTE — Assessment & Plan Note (Signed)
Seems to be more paresthesias than pain. EKG normal. Await x-ray of her neck. See plan below.

## 2015-02-01 NOTE — Assessment & Plan Note (Signed)
Cutting down on her cigarettes, but still needs the chantix. Refill given today.

## 2015-02-01 NOTE — Progress Notes (Signed)
BP 151/93 mmHg  Pulse 74  Temp(Src) 97.7 F (36.5 C)  Wt 192 lb 12.8 oz (87.454 kg)  SpO2 99%   Subjective:    Patient ID: Monica Hull, female    DOB: [birth year], 54 y.o.   MRN: [id number]  HPI: Monica Hull is a 54 y.o. female  Chief Complaint  Patient presents with  . Hypertension  . Nicotine Dependence    patient is down to 1 ppd, she was at 1.5-2ppd  . Arm Pain    left arm pain, she states that the voltaren is not working, she is having a lot of pain at night   HYPERTENSION Hypertension status: uncontrolled  Satisfied with current treatment? yes Duration of hypertension: 1-2 months BP monitoring frequency:  not checking  BP medication side effects:  no Medication compliance: excellent compliance Aspirin: no Recurrent headaches: no Visual changes: no Palpitations: no Dyspnea: no Chest pain: no Lower extremity edema: no Dizzy/lightheaded: no   L ARM PAIN- Has been having pain in her L arm- waking her up at night. Hurts from the whole arm down into her arm and her fingers tingle. Wakes her up at night. All the fingers. She notes that it has been getting worse.  Diagnosis:  Status: uncontrolled Treatments attempted: rest, ice, heat, APAP, ibuprofen and aleve  Compliant with recommended treatment: yes Relief with NSAIDs?:  mild Location:Left Duration:weeks Severity: moderate Quality: tingling Frequency: constant Radiation: none Aggravating factors: lifting and movement Alleviating factors: nothing Weakness:  no Paresthesias / decreased sensation:  yes  Fevers:  no  Relevant past medical, surgical, family and social history reviewed and updated as indicated. Interim medical history since our last visit reviewed. Allergies and medications reviewed and updated.  Review of Systems  Constitutional: Negative.   Respiratory: Negative.   Cardiovascular: Negative.   Psychiatric/Behavioral: Negative.     Per HPI unless specifically indicated above     Objective:    BP 151/93 mmHg  Pulse 74  Temp(Src) 97.7 F (36.5 C)  Wt 192 lb 12.8 oz (87.454 kg)  SpO2 99%  Wt Readings from Last 3 Encounters:  02/01/15 192 lb 12.8 oz (87.454 kg)  01/04/15 188 lb 8 oz (85.503 kg)  12/21/14 187 lb 12.8 oz (85.186 kg)    Physical Exam  Constitutional: She is oriented to person, place, and time. She appears well-developed and well-nourished. No distress.  HENT:  Head: Normocephalic and atraumatic.  Right Ear: Hearing normal.  Left Ear: Hearing normal.  Nose: Nose normal.  Eyes: Conjunctivae and lids are normal. Right eye exhibits no discharge. Left eye exhibits no discharge. No scleral icterus.  Cardiovascular: Normal rate, regular rhythm and normal heart sounds.  Exam reveals no gallop and no friction rub.   No murmur heard. Pulmonary/Chest: Effort normal and breath sounds normal. No respiratory distress. She has no wheezes. She has no rales. She exhibits no tenderness.  Musculoskeletal: Normal range of motion.  Neurological: She is alert and oriented to person, place, and time.  Skin: Skin is warm, dry and intact. No rash noted.  Psychiatric: She has a normal mood and affect. Her speech is normal and behavior is normal. Judgment and thought content normal. Cognition and memory are normal.  Nursing note and vitals reviewed. Neck Exam:    Tenderness to Palpation: yes    Midline cervical spine: no    Paraspinal neck musculature: no    Trapezius: yes    Sternocleidomastoid: no     Range of Motion:  Flexion: Normal    Extension: Normal    Lateral rotation: Normal    Lateral bending: Normal     Neuro Examination: Abnormal    C5   Reflex ( Biceps): Normal, Symmetric and 2/4            Muscle (Deltoid and biceps): Within Normal Limits            Sensation (Lateral arm): Diminished on the L       C6   Reflex (Brachioradialis): Normal, Symmetric and 2/4            Muscle (Wrist extension and biceps): Within Normal Limits             Sensation (Lateral forearm):Diminished on the L     C7   Reflex (Triceps): Normal, Symmetric and 2/4            Muscle ( Wrist flexors, finger extension, triceps): Within Normal Limits            Sensation (Middle finger): Diminished on the L      C8   Muscle (Finger extension, hand intrinsics): Within Normal Limits            Sensation (Medial forearm):Diminished       T1   Muscle (Hand intrinsics):Within Normal Limits            Sensation (Medial arm):Diminished     Special Tests:     Spurling test: negative   Results for orders placed or performed in visit on 01/04/15  HM COLONOSCOPY  Result Value Ref Range   HM Colonoscopy Polyp- due in 5 years       Assessment & Plan:   Problem List Items Addressed This Visit      Cardiovascular and Mediastinum   Hypertension - Primary    BP still elevated. Will increase medication to  daily and recheck in 1 month.      Relevant Medications   lisinopril (PRINIVIL,ZESTRIL) 20 MG tablet   Other Relevant Orders   Basic metabolic panel   Basic metabolic panel     Other   Tobacco abuse    Cutting down on her cigarettes, but still needs the chantix. Refill given today.       Relevant Medications   varenicline (CHANTIX) 1 MG tablet   Arm pain, inferior    Seems to be more paresthesias than pain. EKG normal. Await x-ray of her neck. See plan below.       Relevant Orders   DG Cervical Spine Complete   EKG 12-Lead (Completed)   Paresthesias    Decreased sensation on exam. Otherwise normal neurologic exam. ?nerve impingement. Will check x-ray of c-spine and await results. EKG done today looked normal today. Will consider neurolepics vs. PT pending results of her x-ray.       Relevant Orders   Basic metabolic panel   DG Cervical Spine Complete    Other Visit Diagnoses    STI (sexually transmitted infection)        I will check Hep C today.     Relevant Orders    Hepatitis C Antibody        Follow up plan: Return in  about 4 weeks (around 03/01/2015).

## 2015-02-01 NOTE — Assessment & Plan Note (Signed)
BP still elevated. Will increase medication to  daily and recheck in 1 month.

## 2015-02-02 ENCOUNTER — Encounter: Payer: Self-pay | Admitting: Family Medicine

## 2015-02-02 LAB — BASIC METABOLIC PANEL
BUN/Creatinine Ratio: 10 (ref 9–23)
BUN: 7 mg/dL (ref 6–24)
CHLORIDE: 101 mmol/L (ref 97–108)
CO2: 23 mmol/L (ref 18–29)
Calcium: 9.4 mg/dL (ref 8.7–10.2)
Creatinine, Ser: 0.71 mg/dL (ref 0.57–1.00)
GFR, EST AFRICAN AMERICAN: 111 mL/min/{1.73_m2} (ref >59)
GFR, EST NON AFRICAN AMERICAN: 96 mL/min/{1.73_m2} (ref >59)
GLUCOSE: 126 mg/dL — AB (ref 65–99)
Potassium: 4.3 mmol/L (ref 3.5–5.2)
SODIUM: 142 mmol/L (ref 134–144)

## 2015-02-02 LAB — HEPATITIS C ANTIBODY: Hep C Virus Ab: <0.1 s/co ratio (ref 0.0–0.9)

## 2015-02-03 ENCOUNTER — Encounter: Payer: 59 | Admitting: Obstetrics and Gynecology

## 2015-02-08 ENCOUNTER — Telehealth: Payer: Self-pay | Admitting: Family Medicine

## 2015-02-08 ENCOUNTER — Ambulatory Visit
Admission: RE | Admit: 2015-02-08 | Discharge: 2015-02-08 | Disposition: A | Discharge status: Home / Self Care | Payer: 59 | Source: Ambulatory Visit | Attending: Family Medicine | Admitting: Family Medicine

## 2015-02-08 ENCOUNTER — Ambulatory Visit (INDEPENDENT_AMBULATORY_CARE_PROVIDER_SITE_OTHER): Payer: 59 | Admitting: Obstetrics and Gynecology

## 2015-02-08 VITALS — BP 139/88 | HR 64 | Resp 16 | Ht 62.0 in | Wt 191.0 lb

## 2015-02-08 DIAGNOSIS — E669 Obesity, unspecified: Secondary | ICD-10-CM

## 2015-02-08 DIAGNOSIS — Z720 Tobacco use: Secondary | ICD-10-CM

## 2015-02-08 DIAGNOSIS — N3946 Mixed incontinence: Secondary | ICD-10-CM

## 2015-02-08 DIAGNOSIS — M5030 Other cervical disc degeneration, unspecified cervical region: Secondary | ICD-10-CM | POA: Diagnosis present

## 2015-02-08 DIAGNOSIS — M5022 Other cervical disc displacement, mid-cervical region: Secondary | ICD-10-CM | POA: Diagnosis not present

## 2015-02-08 DIAGNOSIS — M47812 Spondylosis without myelopathy or radiculopathy, cervical region: Secondary | ICD-10-CM

## 2015-02-08 DIAGNOSIS — M5090 Cervical disc disorder, unspecified, unspecified cervical region: Secondary | ICD-10-CM

## 2015-02-08 DIAGNOSIS — M4802 Spinal stenosis, cervical region: Secondary | ICD-10-CM | POA: Insufficient documentation

## 2015-02-08 NOTE — Patient Instructions (Addendum)
1) Decrease caffeineated beverages to no more than 3-4 daily.  2) Do not drink anything approximately 1 to 1.5 hours prior to bedtime.  3) Be sure to empty bladder prior to going to bed.  4) Continue to decrease smoking.  5) Timed voiding (be sure to empty bladder at least every 3-4 hours scheduled).  Do not hold urine.

## 2015-02-08 NOTE — Telephone Encounter (Signed)
Discussed results of MRI with [facility]. She would like to see neurosurgery and would like to do PT. PT already ordered. Referral to neurosurgery generated today.

## 2015-02-08 NOTE — Telephone Encounter (Signed)
Called to give Monica Hull her results of her MRI- she will call back.

## 2015-02-08 NOTE — Progress Notes (Signed)
GYNECOLOGY PROGRESS NOTE  Subjective:     Monica Hull is a 54 y.o. P59 female who was referred by [facility] for evaluation of urinary incontinence. This has been present for approximately 1 year. She leaks urine with bending, coughing, laughing, sneezing. Only leaks a small amount, does not empty entire bladder with leakage.  Patient describes the symptoms as nocturia 3 times per night, urge to urinate with little or no warning, urine leakage with coughing/heavy physical activity and voiding small amounts. Factors associated with symptoms include: increased caffeine intake (drinks 6-8 Mountain Dew 12 oz cans daily), denies coffee or tea. Limited water intake.  Also drinks right up until bedtime.  Denies holding urine for long periods of time (> 4 hrs). Evaluation to date includes UA/CS: normal. Treatment to date includes none.    Past Medical History  Diagnosis Date  . Asthma   . GERD (gastroesophageal reflux disease)   . Hyperlipidemia   . Hypertension   . Impaired fasting glucose   . COPD (chronic obstructive pulmonary disease)   . Tobacco abuse   . Elevated liver function tests   . Carpal tunnel syndrome   . Sleep apnea     Not using CPAP- referred to sleep center  . Diabetes mellitus without complication     Past Surgical History  Procedure Laterality Date  . Cesarean section      X2  . Carpal tunnel release      Social History  Substance Use Topics  . Smoking status: Current Every Day Smoker -- 2.00 packs/day for 39 years    Types: Cigarettes  . Smokeless tobacco: Never Used  . Alcohol Use: No    Medication Sig  . diclofenac (VOLTAREN) 75 MG EC tablet Take 75 mg by mouth 2 (two) times daily as needed.  Marland Kitchen lisinopril (PRINIVIL,ZESTRIL) 20 MG tablet Take 1 tablet (20 mg total) by mouth daily.  . pantoprazole (PROTONIX) 40 MG tablet Take 40 mg by mouth daily.  . varenicline (CHANTIX PAK) 0.5 MG X 11 & 1 MG X 42 tablet Take by mouth 2 (two) times  daily. Take one 0.5 mg tablet by mouth once daily for 3 days, then increase to one 0.5 mg tablet twice daily for 4 days, then increase to one 1 mg tablet twice daily.  . varenicline (CHANTIX) 1 MG tablet Take 1 tablet (1 mg total) by mouth 2 (two) times daily.    Allergies  Allergen Reactions  . Biaxin [Clarithromycin] Nausea And Vomiting and Rash     Review of Systems Pertinent items are noted in HPI.    Objective:    BP 139/88 mmHg  Pulse 64  Resp 16  Ht  (1.575 m)  Wt 191 lb (86.637 kg)  BMI 34.93 kg/m2 General appearance: alert and no distress Abdomen: soft, non-tender; bowel sounds normal; no masses,  no organomegaly Pelvic: cervix normal in appearance, external genitalia normal, no adnexal masses or tenderness, no cervical motion tenderness, positive findings: mild vaginal atrophy and urethral q-tip test positive. , rectovaginal septum normal, uterus normal size, shape, and consistency and vagina normal without discharge Extremities: extremities normal, atraumatic, no cyanosis or edema Neurologic: Grossly normal    Lab Review Urine analysis (not performed today as patient without urinary symptoms) was performed on 12/11/14, and showed:  Negative.   Micro exam: Clear   Assessment:   Stress incontinence. Severity = moderate. Detrusor instability.  Severity = moderate.   Nocturia.  Severity = moderate.  Plan:    The causes of incontinence and plan for evaluation and treatment were discussed. Appropriate educational materials were distributed. Discussed Kegel exercised in detail. Discussed planned voiding. Follow up in 3 weeks or PRN Discussed behavioral modification with weight management, tobacco cessation (currently using Chantix, has decreased smoking from 2 ppd to 1 ppd currently), decreasing night-time drinking, and decreasing intake of caffeinated beverages.    If no change, or only modest improvement with symptoms with current management by next visit, will  consider medications at that time.    Hildred Laser, MD [facility]

## 2015-02-15 ENCOUNTER — Ambulatory Visit: Payer: 59 | Attending: Family Medicine | Admitting: Physical Therapy

## 2015-02-15 ENCOUNTER — Encounter: Payer: Self-pay | Admitting: Physical Therapy

## 2015-02-15 DIAGNOSIS — M7580 Other shoulder lesions, unspecified shoulder: Secondary | ICD-10-CM | POA: Insufficient documentation

## 2015-02-15 DIAGNOSIS — M5412 Radiculopathy, cervical region: Secondary | ICD-10-CM | POA: Insufficient documentation

## 2015-02-15 DIAGNOSIS — R29898 Other symptoms and signs involving the musculoskeletal system: Secondary | ICD-10-CM | POA: Diagnosis present

## 2015-02-15 DIAGNOSIS — M436 Torticollis: Secondary | ICD-10-CM | POA: Diagnosis present

## 2015-02-15 DIAGNOSIS — M25619 Stiffness of unspecified shoulder, not elsewhere classified: Secondary | ICD-10-CM

## 2015-02-15 NOTE — Patient Instructions (Signed)
Stretch Break - Chin Tuck   Looking straight forward, tuck chin and hold __3__ seconds. Relax and return to starting position. Repeat __10__ times every _2___ hours.  Copyright  VHI. All rights reserved.  Active Neck Rotation   With head in a comfortable position and chin gently tucked in, rotate head to the right. Hold ____ seconds. Repeat to the left. Repeat ____ times. Do ____ sessions per day.  http://[email or web address].[email or web address]   Copyright  VHI. All rights reserved.  Active Neck Rotation   With head in a comfortable position and chin gently tucked in, rotate head to the right. Hold __2_ seconds. Repeat to the left. Repeat __10__ times. Do _3___ sessions per day.  [email or web address]://[email or web address].[email or web address]   Copyright  VHI. All rights reserved.  Neck Retraction: Side-Bend   Sitting or standing, tuck chin and side-bend head toward left shoulder. Repeat ___10_ times per set. Do __10__ sets per session. Do __3__ sessions per day.  http://[email or web address].exer.[email or web address]   Copyright  VHI. All rights reserved.

## 2015-02-16 NOTE — Therapy (Signed)
[facility] MAIN [facility] SERVICES [address] [place], [place], [address] Phone: [phone]   Fax:  [phone]  Physical Therapy Evaluation  Patient Details  Name: Monica Hull MRN: [id number] Date of Birth: 1960-09-08 Referring Provider:  Dorcas Carrow, DO  Encounter Date: 02/15/2015      PT End of Session - 02/15/15 1023    Visit Number 1   Number of Visits 17   Date for PT Re-Evaluation 04/12/15   PT Start Time 1014   PT Stop Time 1118   PT Time Calculation (min) 64 min   Activity Tolerance Patient tolerated treatment well   Behavior During Therapy [facility] for tasks assessed/performed      Past Medical History  Diagnosis Date  . Asthma   . GERD (gastroesophageal reflux disease)   . Hyperlipidemia   . Hypertension   . Impaired fasting glucose   . COPD (chronic obstructive pulmonary disease)   . Tobacco abuse   . Elevated liver function tests   . Carpal tunnel syndrome   . Sleep apnea     Not using CPAP- referred to sleep center  . Diabetes mellitus without complication     Past Surgical History  Procedure Laterality Date  . Cesarean section      X2  . Carpal tunnel release      There were no vitals filed for this visit.  Visit Diagnosis:  Cervical radiculopathy  Weakness of both arms - Plan: PT plan of care cert/re-cert  Decreased range of motion (ROM) of shoulder - Plan: PT plan of care cert/re-cert  Stiffness of cervical spine - Plan: PT plan of care cert/re-cert      Subjective Assessment - 02/15/15 0920    Subjective Patient reports that she is having pain in both of her arms, as well numbness and tingling. States that pain starts in her upper arm and goes all the way to the fingers in bilateral UE. She reports that she has had pain/numbness and tingling off and on for the past couple of months, but it has gotten a lot worse in the past month. She states that she is in pain upon arrival to PT (4/10) she  reports that the pain appears to get worse throughout the day and is so bad at night that it has been waking her. She states that the pain is worst at night (10/10), pain at best (3/10) .  She states that normally, most of her pain is bilateral UE with occasional slight pain in her neck.   Pertinent History History of COPD. High blood pressure, tobacco use, reports that she is pre-diabetic. 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    Limitations Sitting;Lifting   How long can you sit comfortably? A few minutes    How long can you stand comfortably? Able to stand as long as needed    How long can you walk comfortably? able to walk as far as needed    Diagnostic tests MRI - Joint to have DDD and foraminal stenosis bilaterally C4-C5 and C6-C7, formanial stenosis on the L C5-C6. Broad based disc bulge noted C4-C7.    Patient Stated Goals Decrease pain, sleep better, increase strength in bilateral UE    Currently in Pain? Yes   Pain Score 4    Pain Location Arm   Pain Orientation Right;Left   Pain Descriptors / Indicators Tingling;Numbness   Pain Type Acute pain   Pain Radiating Towards  Bilateral UE    Pain Onset More than a month ago   Pain Frequency Constant   Aggravating Factors  Sleeping, lifting    Pain Relieving Factors Pain meds, but not helping much    Effect of Pain on Daily Activities Reduced sleep and reduced activity             [facility] PT Assessment - 02/16/15 0001    Assessment   Medical Diagnosis Cervical DJD   Onset Date/Surgical Date 02/01/15   Hand Dominance Right   Next MD Visit 03/07/15   Prior Therapy No prior PT for this problem or any other musculoskeletal pathology   Precautions   Precautions None   Restrictions   Weight Bearing Restrictions No   Balance Screen   Has the patient fallen in the past 6 months No   Has the patient had a decrease in activity level because of a fear of falling?  No   Is the patient reluctant to leave their home  because of a fear of falling?  No   Home [employer] information [employer] manager residence   Living Arrangements Spouse/significant other   Available Help at Discharge Family   Type of Home Mobile home   Home Access Stairs to enter   Entrance Stairs-Number of Steps 3   Entrance Stairs-Rails Right;Left   Home Layout One level   Home Equipment None   Prior Function   Level of Independence Independent   Vocation Unemployed   Academic librarian.    Leisure spend time with grandchild    Cognition   Overall Cognitive Status Within Functional Limits for tasks assessed   Observation/Other Assessments   Neck Disability Index  26% impaired    Sensation   Light Touch Appears Intact   Additional Comments Patient reports numbness and tingling, but light touch is intact bilaterally with no extinction noted   Posture/Postural Control   Posture Comments Patient sits and stands with rounded shoulders, increased thoracic kyphosis, and forward head posture.    AROM   Overall AROM Comments Patient reports increased tingling in each UE from the shoulder to the hands with shoulder flexion and abduction end range AROM   Right Shoulder Extension 40 Degrees   Right Shoulder Flexion 110 Degrees   Right Shoulder ABduction 112 Degrees   Left Shoulder Extension 45 Degrees   Left Shoulder Flexion 120 Degrees   Left Shoulder ABduction 117 Degrees   Cervical Flexion 35   Cervical Extension 28   Cervical - Right Side Bend 40  Some tingling in R hand.    Cervical - Left Side Bend 35   Cervical - Right Rotation 40   Cervical - Left Rotation 45   Strength   Overall Strength Comments Grip strength R: 45#, L: 50#. Shoulder flexion and abduction MMT increases tingling in the UE. All LE MMT within normal limits    Right Shoulder Flexion 4+/5   Right Shoulder Extension 4+/5   Right Shoulder ABduction 4+/5   Right Shoulder Internal Rotation 5/5   Right Shoulder External Rotation 4+/5   Left  Shoulder Flexion 4+/5   Left Shoulder Extension 4+/5   Left Shoulder ABduction 4+/5   Left Shoulder Internal Rotation 5/5   Left Shoulder External Rotation 5/5   Right Elbow Flexion 4+/5   Right Elbow Extension 4+/5   Left Elbow Flexion 4+/5   Left Elbow Extension 5/5   Right Forearm Pronation 5/5   Right Forearm Supination 5/5   Left Forearm  Pronation 5/5   Left Forearm Supination 5/5   Palpation   Palpation comment Patient reports tenderness in the suboccipital region and increased tingling in bilateral UE with grade II PA cervical mobilization C4-T1. Decreased rotational joint movement noted bilaterally with increased tingling in the hands with rotational mobilizations on the R.    Spurling's   Findings Negative   Side Right   Comment Negative bilaterally    Distraction Test   Findngs Positive   Comment Patient in supine and Cervical spine in neutral, Patient reports decreased tingling in bilateral UE    Transfers   Comments --   Ambulation/Gait   Gait Comments Patient ambulates with reciprocal gait pattern, but demonstrated decreased UE movement bilaterally as well as rounded shoulders, increased thoracic kyphosis and forward head            Seated HEP initiated.  Chin tucks 2x8 Cervical rotation 2x5 bilaterally Cervical lateral flexion 2x5 bilaterally  PT required to provided moderate verbal and tactile instructions for proper exercise form with chin tucks to reduce cervical extension and improve quality of movement and with lateral flexion to maintain pain free range.                  PT Education - 02/15/15 1021    Education provided Yes   Education Details Plan of care, HEP initiatied - see patients     Person(s) Educated Patient   Methods Explanation;Demonstration;Tactile cues;Verbal cues   Comprehension Verbalized understanding;Returned demonstration;Verbal cues required;Tactile cues required             PT Long Term Goals - 02/16/15 0759     PT LONG TERM GOAL #1   Title Patient will be independent with HEP to increase UE and Cervical ROM by 04/12/2015 to increase function within the home.    Time 8   Period Weeks   Status New   PT LONG TERM GOAL #2   Title Patient will improve NDI to <15% impaired to indicate increased function with ADLs.     Time 8   Period Weeks   Status New   PT LONG TERM GOAL #3   Title Patient will increase bilateral shoulder flexion and abduction to greater than 150 degrees to improve ability to reach object on a high shelf by 04/12/15.    Time 8   Period Weeks   Status New   PT LONG TERM GOAL #4   Title Patient will increase bilateral shoulder and elbow strength to 5/5 to allow patient lift heavy objects with tasks at home by 04/12/15.    Time 8   Period Weeks   Status New   PT LONG TERM GOAL #5   Title Patient will increase cervical rotation to 60 degrees bilaterally to improve function with driving by 45/40/98.    Time 8   Period Weeks   Status New               Plan - 02/16/15 0744    Clinical Impression Statement Patient is a pleasant 54 year old female that reports to PT with complaints of bilateral UE pain, numbness, and tingling secondary to foraminal stenosis noted in MRI in C4-C7. Patient demonstrates poor sitting and standing posture with increased thoracic kyphosis, forward head, and rounded shoulders. Upon PT evaluation, patient was found to have decreased UE strength in the shoulder and elbow and decreased AROM in the shoulder causing increased numbness and tingling in bilateral UE. Patient was also found to have decrease  cervical  ROM in all directions with increased tingling noted in bilateral UE and hands with Cervical rotation and Lateral flexion bilaterally; no increased pain or tingling with cervical flexion or extension. Positive distraction test with decreased pain and tingling in bilateral UE, but negative for Spurling's cervical compression bilaterally. Based on  deficits noted through PT evaluation, this patient would benefit from skilled PT to improve posture, decrease pain, numbness and tingling in the UE, as well as increases strength and  AROM in the UE and in the cervical spine to allow great function with daily tasks.   Pt will benefit from skilled therapeutic intervention in order to improve on the following deficits Decreased activity tolerance;Decreased endurance;Decreased mobility;Decreased range of motion;Decreased strength;Hypomobility;Increased fascial restricitons;Impaired flexibility;Impaired sensation;Impaired UE functional use;Pain;Improper body mechanics;Postural dysfunction   Rehab Potential Good   Clinical Impairments Affecting Rehab Potential Positive: recent onset of pain, age, family support. Negative: Bilateral UE affected, co-morbidities.     PT Frequency 2x / week   PT Duration 8 weeks   PT Treatment/Interventions Cryotherapy;ADLs/Self Care Home Management;Electrical Stimulation;Moist Heat;Traction;Ultrasound;Therapeutic activities;Functional mobility training;Therapeutic exercise;Patient/family education;Manual techniques;Passive range of motion;Dry needling   PT Next Visit Plan Increase HEP, Manual therapy, positioning and posture education   PT Home Exercise Plan HEP initiated - see patient instructions    Consulted and Agree with Plan of Care Patient         Problem List Patient Active Problem List   Diagnosis Date Noted  . Arm pain, inferior 02/01/2015  . Paresthesias 02/01/2015  . DJD (degenerative joint disease), cervical 02/01/2015  . COPD exacerbation 12/21/2014  . Stress incontinence 12/21/2014  . Chest pain 10/20/2014  . Asthma   . GERD (gastroesophageal reflux disease)   . Sleep apnea   . Hyperlipidemia   . Hypertension   . Impaired fasting glucose   . COPD (chronic obstructive pulmonary disease)   . Tobacco abuse    Grier Rocher SPT 02/16/2015   11:14 AM  This entire session was performed under  direct supervision and direction of a licensed therapist. I have personally read, edited and approve of the note as written.  Hopkins,Margaret, PT, DPT 02/16/2015, 11:14 AM  [facility] MAIN [facility] SERVICES [address] [place], [place], [address] Phone: [phone]   Fax:  [phone]

## 2015-02-17 ENCOUNTER — Telehealth: Payer: Self-pay | Admitting: Family Medicine

## 2015-02-17 NOTE — Telephone Encounter (Signed)
Patient notified via VM

## 2015-02-17 NOTE — Telephone Encounter (Signed)
She's on the voltaren. Will need to be seen to discuss this further.

## 2015-02-17 NOTE — Telephone Encounter (Signed)
Forward to provider

## 2015-02-17 NOTE — Telephone Encounter (Signed)
Pt's husband called requesting Dr. Laural Benes call something in for the pt's arthritis. Pharm is [facility] in [place]. Thanks.

## 2015-02-21 ENCOUNTER — Ambulatory Visit: Payer: 59 | Admitting: Physical Therapy

## 2015-02-21 ENCOUNTER — Encounter: Payer: Self-pay | Admitting: Physical Therapy

## 2015-02-21 DIAGNOSIS — R29898 Other symptoms and signs involving the musculoskeletal system: Secondary | ICD-10-CM

## 2015-02-21 DIAGNOSIS — M5412 Radiculopathy, cervical region: Secondary | ICD-10-CM

## 2015-02-21 DIAGNOSIS — M436 Torticollis: Secondary | ICD-10-CM

## 2015-02-21 DIAGNOSIS — M25619 Stiffness of unspecified shoulder, not elsewhere classified: Secondary | ICD-10-CM

## 2015-02-21 NOTE — Therapy (Signed)
[facility] MAIN [facility] SERVICES [address] [place], [place], [address] Phone: [phone]   Fax:  [phone]  Physical Therapy Treatment  Patient Details  Name: Monica Hull MRN: [id number] Date of Birth: [birth year] Referring Provider:  Dorcas Carrow, DO  Encounter Date: 02/21/2015      PT End of Session - 02/21/15 0931    Visit Number 2   Number of Visits 17   Date for PT Re-Evaluation 04/12/15   Activity Tolerance Patient tolerated treatment well   Behavior During Therapy [facility] for tasks assessed/performed      Past Medical History  Diagnosis Date  . Asthma   . GERD (gastroesophageal reflux disease)   . Hyperlipidemia   . Hypertension   . Impaired fasting glucose   . COPD (chronic obstructive pulmonary disease)   . Tobacco abuse   . Elevated liver function tests   . Carpal tunnel syndrome   . Sleep apnea     Not using CPAP- referred to sleep center  . Diabetes mellitus without complication     Past Surgical History  Procedure Laterality Date  . Cesarean section      X2  . Carpal tunnel release      There were no vitals filed for this visit.  Visit Diagnosis:  Weakness of both arms  Decreased range of motion (ROM) of shoulder  Stiffness of cervical spine  Cervical radiculopathy      Subjective Assessment - 02/21/15 0930    Subjective Patient continues to have pain and numbness in BUE 8/10.    Pertinent History History of COPD. High blood pressure, tobacco use, reports that she is pre-diabetic. 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    Limitations Sitting;Lifting   How long can you sit comfortably? A few minutes    How long can you stand comfortably? Able to stand as long as needed    How long can you walk comfortably? able to walk as far as needed    Diagnostic tests MRI - Joint to have DDD and foraminal stenosis bilaterally C4-C5 and C6-C7, formanial stenosis on the L C5-C6.  Broad based disc bulge noted C4-C7.    Patient Stated Goals Decrease pain, sleep better, increase strength in bilateral UE    Pain Score 8    Pain Location Arm   Pain Orientation Right;Left   Pain Onset More than a month ago       Patient seen for review of HEP: chin tucks and upper trap stretching bilaterally Cervical traction x 20 minutes intervals 10 on 10 sec off 20 lbs. Scapula retraction with YtB x 20 with chin tuck x 20 x 2 sets Supine flex BUE with cane x 20 x 2 sets Supine protraction x 20 x 2 sets Supine horizontal abd/add with cane Pulley seated x 5 minutes Good tolerance with pain following treatment.                          PT Education - 02/21/15 0931    Education provided Yes   Education Details HEP review   Person(s) Educated Patient   Methods Explanation   Comprehension Verbalized understanding             PT Long Term Goals - 02/16/15 0759    PT LONG TERM GOAL #1   Title Patient will be independent with HEP to increase UE and Cervical ROM by 04/12/2015 to  increase function within the home.    Time 8   Period Weeks   Status New   PT LONG TERM GOAL #2   Title Patient will improve NDI to <15% impaired to indicate increased function with ADLs.     Time 8   Period Weeks   Status New   PT LONG TERM GOAL #3   Title Patient will increase bilateral shoulder flexion and abduction to greater than 150 degrees to improve ability to reach object on a high shelf by 04/12/15.    Time 8   Period Weeks   Status New   PT LONG TERM GOAL #4   Title Patient will increase bilateral shoulder and elbow strength to 5/5 to allow patient lift heavy objects with tasks at home by 04/12/15.    Time 8   Period Weeks   Status New   PT LONG TERM GOAL #5   Title Patient will increase cervical rotation to 60 degrees bilaterally to improve function with driving by 16/10/96.    Time 8   Period Weeks   Status New               Plan - 02/21/15  0931    Clinical Impression Statement Patient was seen for cervical traction intermittent 10 sec on and 10 sec off followed by review of HEP. Patient has 8/10 pain.    Pt will benefit from skilled therapeutic intervention in order to improve on the following deficits Decreased activity tolerance;Decreased endurance;Decreased mobility;Decreased range of motion;Decreased strength;Hypomobility;Increased fascial restricitons;Impaired flexibility;Impaired sensation;Impaired UE functional use;Pain;Improper body mechanics;Postural dysfunction   Rehab Potential Good   Clinical Impairments Affecting Rehab Potential Positive: recent onset of pain, age, family support. Negative: Bilateral UE affected, co-morbidities.     PT Frequency 2x / week   PT Duration 8 weeks   PT Treatment/Interventions Cryotherapy;ADLs/Self Care Home Management;Electrical Stimulation;Moist Heat;Traction;Ultrasound;Therapeutic activities;Functional mobility training;Therapeutic exercise;Patient/family education;Manual techniques;Passive range of motion;Dry needling   PT Next Visit Plan Increase HEP, Manual therapy, positioning and posture education   PT Home Exercise Plan HEP initiated - see patient instructions    Consulted and Agree with Plan of Care Patient        Problem List Patient Active Problem List   Diagnosis Date Noted  . Arm pain, inferior 02/01/2015  . Paresthesias 02/01/2015  . DJD (degenerative joint disease), cervical 02/01/2015  . COPD exacerbation 12/21/2014  . Stress incontinence 12/21/2014  . Chest pain 10/20/2014  . Asthma   . GERD (gastroesophageal reflux disease)   . Sleep apnea   . Hyperlipidemia   . Hypertension   . Impaired fasting glucose   . COPD (chronic obstructive pulmonary disease)   . Tobacco abuse     Ezekiel Ina 02/21/2015, 9:53 AM  [facility] MAIN [facility] SERVICES [address] [place], [place], [address] Phone: [phone]   Fax:   [phone]

## 2015-02-23 ENCOUNTER — Ambulatory Visit: Payer: 59 | Admitting: Physical Therapy

## 2015-03-02 ENCOUNTER — Ambulatory Visit: Payer: 59 | Attending: Family Medicine | Admitting: Physical Therapy

## 2015-03-02 DIAGNOSIS — R29898 Other symptoms and signs involving the musculoskeletal system: Secondary | ICD-10-CM

## 2015-03-02 DIAGNOSIS — M436 Torticollis: Secondary | ICD-10-CM | POA: Diagnosis present

## 2015-03-02 DIAGNOSIS — M5412 Radiculopathy, cervical region: Secondary | ICD-10-CM | POA: Diagnosis present

## 2015-03-02 DIAGNOSIS — M7580 Other shoulder lesions, unspecified shoulder: Secondary | ICD-10-CM | POA: Diagnosis not present

## 2015-03-02 DIAGNOSIS — M25619 Stiffness of unspecified shoulder, not elsewhere classified: Secondary | ICD-10-CM

## 2015-03-02 NOTE — Therapy (Signed)
[place] [facility] MAIN [facility] SERVICES [address] [place], [place], [address] Phone: [phone]   Fax:  [phone]  Physical Therapy Treatment  Patient Details  Name: Monica Hull MRN: [id number] Date of Birth: August 06, 1960 Referring Provider:  Dorcas Carrow, DO  Encounter Date: 03/02/2015      PT End of Session - 03/02/15 1112    Visit Number 3   Number of Visits 17   Date for PT Re-Evaluation 04/12/15   PT Start Time 1036   PT Stop Time 1120   PT Time Calculation (min) 44 min   Activity Tolerance Patient tolerated treatment well   Behavior During Therapy [facility] for tasks assessed/performed      Past Medical History  Diagnosis Date  . Asthma   . GERD (gastroesophageal reflux disease)   . Hyperlipidemia   . Hypertension   . Impaired fasting glucose   . COPD (chronic obstructive pulmonary disease)   . Tobacco abuse   . Elevated liver function tests   . Carpal tunnel syndrome   . Sleep apnea     Not using CPAP- referred to sleep center  . Diabetes mellitus without complication     Past Surgical History  Procedure Laterality Date  . Cesarean section      X2  . Carpal tunnel release      There were no vitals filed for this visit.  Visit Diagnosis:  Decreased range of motion (ROM) of shoulder  Stiffness of cervical spine  Weakness of both arms      Subjective Assessment - 03/02/15 1037    Subjective Patient reports that she is doing "okay" upon arrival to PT. She states that she has slight pain in bilateral shoulder 2/10. She also states that she had shoulder and arm pain 9/10 upon waking this AM; she took pain medication which reduced pain to 2/10. She saw her neurologist in [place] on 8/26. She states that has an appointment with neurologist on 03/17/15 for NCV test.    Pertinent History History of COPD. High blood pressure, tobacco use, reports that she is pre-diabetic. Also has a history of Arthitis, DDD, DJD in the  cervical spine.    Limitations Sitting;Lifting   How long can you sit comfortably? A few minutes    How long can you stand comfortably? Able to stand as long as needed    How long can you walk comfortably? able to walk as far as needed    Diagnostic tests MRI - Joint to have DDD and foraminal stenosis bilaterally C4-C5 and C6-C7, formanial stenosis on the L C5-C6. Broad based disc bulge noted C4-C7.    Patient Stated Goals Decrease pain, sleep better, increase strength in bilateral UE    Currently in Pain? Yes   Pain Score 2    Pain Location Arm   Pain Orientation Right;Left   Pain Descriptors / Indicators Tingling;Pressure   Pain Type Acute pain   Pain Onset More than a month ago   Pain Frequency Constant   Aggravating Factors  sleeping and lifting    Pain Relieving Factors Pain meds        Treatment:   UE bike. 2 minutes , level 3 (unbilled)    Chin tucks 3x10 with 3 seconds  Lateral cervical flexion 2x10 with 3 second hold   Min verbal instruction provided by PT to increase cervical retraction with chin tucks and minimize flexion extension. Cues also provided for increase hold time and decrease forward head  between movements. Patient responded well to instruction.   Seated exercises red tband.   BUE Shoulder extension 2x10  BUE Low row 2x10  BUE mid row 2x10  BUE lat pull down 2x10   PT provided min verbal instruction for increased control with eccentric movement, and decreased accessory movements of the trunk and the elbow to increase strengthening. Patient states that he experienced increased tingling in the elbow with mid rows. Cues also provided for increased erect posture with back exercises  Doorway chest stretch 3x10 seconds hold.  Pulley into shoulder flexion 2 x 12 each UE  PT provided min verbal instruction for proper UE positioning to increase stretch and decrease upper back and arm pain. Instruction also provided for increased erect posture with pulley  exercises. Patient notes moderate response to instruction from PT.      PT performed Mechanical traction to the cervical spine for 15 minutes. 15 second pull at 20# and 10 second rest at 10#. Patient reports decrease tingling in hand and decreased pain in arms following traction. Patient also reports centralization of symptoms with slight increase in pain in the cervical spine 2/10.                     PT Education - 03/02/15 1111    Education provided Yes   Education Details postural strengthening, Mechanical traction   Person(s) Educated Patient   Methods Explanation;Demonstration;Verbal cues   Comprehension Verbalized understanding;Returned demonstration;Verbal cues required             PT Long Term Goals - 02/16/15 0759    PT LONG TERM GOAL #1   Title Patient will be independent with HEP to increase UE and Cervical ROM by 04/12/2015 to increase function within the home.    Time 8   Period Weeks   Status New   PT LONG TERM GOAL #2   Title Patient will improve NDI to <15% impaired to indicate increased function with ADLs.     Time 8   Period Weeks   Status New   PT LONG TERM GOAL #3   Title Patient will increase bilateral shoulder flexion and abduction to greater than 150 degrees to improve ability to reach object on a high shelf by 04/12/15.    Time 8   Period Weeks   Status New   PT LONG TERM GOAL #4   Title Patient will increase bilateral shoulder and elbow strength to 5/5 to allow patient lift heavy objects with tasks at home by 04/12/15.    Time 8   Period Weeks   Status New   PT LONG TERM GOAL #5   Title Patient will increase cervical rotation to 60 degrees bilaterally to improve function with driving by 16/10/96.    Time 8   Period Weeks   Status New               Plan - 03/02/15 1112    Clinical Impression Statement Patient instructed in postural exercises for the upper back and cervical spine. PT provided min verbal instruction for  proper exercise positioning, speed of movement and decrease accessory movements of the elbow and trunk. Patient responded well to instruction but reports increased tingling in the elbow with mid rows. PT performed mechanical traction for 15 minutes with 15 second pull(20#), 10 second rest(10#). Patient reports decreased pain in the arm and decreased tingling the hands following mechanical traction as well as centralization of symptoms into the cervical region.  Continued skilled  PT is recommended to improve UE strength, increase UE ROM and to decrease pain in the neck and bilateral UE to improve function with daily tasks.    Pt will benefit from skilled therapeutic intervention in order to improve on the following deficits Decreased activity tolerance;Decreased endurance;Decreased mobility;Decreased range of motion;Decreased strength;Hypomobility;Increased fascial restricitons;Impaired flexibility;Impaired sensation;Impaired UE functional use;Pain;Improper body mechanics;Postural dysfunction   Rehab Potential Good   Clinical Impairments Affecting Rehab Potential Positive: recent onset of pain, age, family support. Negative: Bilateral UE affected, co-morbidities.     PT Frequency 2x / week   PT Duration 8 weeks   PT Treatment/Interventions Cryotherapy;ADLs/Self Care Home Management;Electrical Stimulation;Moist Heat;Traction;Ultrasound;Therapeutic activities;Functional mobility training;Therapeutic exercise;Patient/family education;Manual techniques;Passive range of motion;Dry needling   PT Next Visit Plan Increase HEP, positioning and posture education   PT Home Exercise Plan Continue as given   Consulted and Agree with Plan of Care Patient        Problem List Patient Active Problem List   Diagnosis Date Noted  . Arm pain, inferior 02/01/2015  . Paresthesias 02/01/2015  . DJD (degenerative joint disease), cervical 02/01/2015  . COPD exacerbation 12/21/2014  . Stress incontinence 12/21/2014  .  Chest pain 10/20/2014  . Asthma   . GERD (gastroesophageal reflux disease)   . Sleep apnea   . Hyperlipidemia   . Hypertension   . Impaired fasting glucose   . COPD (chronic obstructive pulmonary disease)   . Tobacco abuse    Grier Rocher SPT 03/03/2015   9:29 AM  This entire session was performed under direct supervision and direction of a licensed therapist . I have personally read, edited and approve of the note as written.  Hopkins,Margaret PT, DPT 03/03/2015, 9:29 AM  [place] [facility] MAIN [facility] SERVICES [address] [place], [place], [address] Phone: [phone]   Fax:  [phone]

## 2015-03-03 ENCOUNTER — Encounter: Payer: Self-pay | Admitting: Obstetrics and Gynecology

## 2015-03-03 ENCOUNTER — Ambulatory Visit (INDEPENDENT_AMBULATORY_CARE_PROVIDER_SITE_OTHER): Payer: 59 | Admitting: Obstetrics and Gynecology

## 2015-03-03 VITALS — BP 131/75 | HR 64 | Ht 62.0 in | Wt 192.1 lb

## 2015-03-03 DIAGNOSIS — E669 Obesity, unspecified: Secondary | ICD-10-CM | POA: Diagnosis not present

## 2015-03-03 DIAGNOSIS — Z720 Tobacco use: Secondary | ICD-10-CM | POA: Diagnosis not present

## 2015-03-03 DIAGNOSIS — N3946 Mixed incontinence: Secondary | ICD-10-CM

## 2015-03-03 MED ORDER — TOLTERODINE TARTRATE ER 2 MG PO CP24: 2.0000 mg | ORAL_CAPSULE | Freq: Every day | ORAL | Status: DC

## 2015-03-03 NOTE — Progress Notes (Signed)
GYNECOLOGY PROGRESS NOTE  Subjective:    Patient ID: Monica Hull, female    DOB: [birth year], 54 y.o.   MRN: [id number]  HPI  Patient is a 54 y.o. P38 female who presents for f/u of mixed urinary incontinence.  Has been implementing lifestyle modifications.  Is currently down to slightly less than 1 ppd on Chantix (previously smoking 2ppd).  Also has decreased caffeineated beverages to 4 per day (previously 6-8) and attempting to consume more water. Is working on weight loss (exercises 3-4 times weekly, consuming more fruits and vegetables). Is attempting timed voiding, but notes minimal success.  Is performing Kegels.  Notes attempts to decrease bedtime fluid consumption but is not consistent yet. Still notes having nocturia and occasional incontinence with increased abdominal pressure.     The following portions of the patient's history were reviewed and updated as appropriate: allergies, current medications, past family history, past medical history, past social history, past surgical history and problem list.  Review of Systems Pertinent items are noted in HPI.   Objective:   Blood pressure 131/75, pulse 64, height  (1.575 m), weight 192 lb 1.6 oz (87.136 kg). Body mass index is 35.13 kg/(m^2).  General appearance: alert and no distress Exam deferred.   Assessment:   Mixed urinary incontinence Tobacco abuse Obesity  Plan:   Continue to encourage lifestyle modifications.  Will initiate Detrol for urinary urges and nocturia.   To reassess symptoms at next visit in 3-4 weeks on medication.  If stress component still a major component, can discuss use of pessary vs OTC remedies (Poise Impressa) vs surgery (sling).     Hildred Laser, MD [facility]

## 2015-03-03 NOTE — Patient Instructions (Signed)
Tolterodine extended-release capsules  What is this medicine?  TOLTERODINE (tole TER a deen) is used to treat overactive bladder. This medicine reduces the amount of bathroom visits. It may also help to control wetting accidents.  This medicine may be used for other purposes; ask your health care provider or pharmacist if you have questions.  COMMON BRAND NAME(S): Detrol LA  What should I tell my health care provider before I take this medicine?  They need to know if you have any of these conditions:  -difficulty passing urine  -glaucoma  -intestinal obstruction  -irregular heartbeat or you have a family member with irregular heartbeat  -kidney disease  -liver disease  -myasthenia gravis  -an unusual or allergic reaction to tolterodine, fesoterodine, other medicines, foods, dyes, or preservatives  -pregnant or trying to get pregnant  -breast-feeding  How should I use this medicine?  Take this medicine by mouth with a glass of water. Swallow whole, do not crush, cut, or chew. Follow the directions on the prescription label. Take your doses at regular intervals. Do not take your medicine more often than directed.  Talk to your pediatrician regarding the use of this medicine in children. Special care may be needed.  Overdosage: If you think you have taken too much of this medicine contact a poison control center or emergency room at once.  NOTE: This medicine is only for you. Do not share this medicine with others.  What if I miss a dose?  If you miss a dose, take it as soon as you can. If it is almost time for your next dose, take only that dose. Do not take double or extra doses.  What may interact with this medicine?  -clarithromycin  -cyclosporine  -erythromycin  -fluoxetine  -medicines for fungal infections, like fluconazole, itraconazole, ketoconazole or voriconazole  -medicines for memory problems like galantamine, donepezil, tacrine  -vinblastine  This list may not describe all possible interactions. Give your  health care provider a list of all the medicines, herbs, non-prescription drugs, or dietary supplements you use. Also tell them if you smoke, drink alcohol, or use illegal drugs. Some items may interact with your medicine.  What should I watch for while using this medicine?  It may take 2 or 3 months to notice the full benefit from this medicine. Your health care professional may also recommend techniques that may help improve control of your bladder and sphincter muscles. These techniques will help you need the bathroom less frequently.  You may need to limit your intake tea, coffee, caffeinated sodas, and alcohol. These drinks may make your symptoms worse. Keeping healthy bowel habits may lessen bladder symptoms. If you currently smoke, quitting smoking may help reduce irritation to the bladder muscle.  You may get drowsy or dizzy. Do not drive, use machinery, or do anything that needs mental alertness until you know how this drug affects you. Do not stand or sit up quickly, especially if you are an older patient. This reduces the risk of dizzy or fainting spells.  Your mouth may get dry. Chewing sugarless gum or sucking hard candy, and drinking plenty of water, will help.  This medicine may cause dry eyes and blurred vision. If you wear contact lenses you may feel some discomfort. Lubricating drops may help. See your eye doctor if the problem does not go away or is severe.  What side effects may I notice from receiving this medicine?  Side effects that you should report to your doctor or health   care professional as soon as possible:  -allergic reactions like skin rash, itching or hives, swelling of the face, lips, or tongue  -breathing problems  -confusion  -difficulty passing urine  -fast, irregular heartbeat  -hallucinations  -memory problems  -swelling in feet, hands  Side effects that usually do not require medical attention (report to your doctor or health care professional if they continue or are  bothersome):  -changes in vision  -constipation  -dry eyes, mouth  -headache  -dizziness, drowsiness  -stomach upset  This list may not describe all possible side effects. Call your doctor for medical advice about side effects. You may report side effects to FDA at 1-800-FDA-1088.  Where should I keep my medicine?  Keep out of the reach of children.  Store at room temperature between 15 and 30 degrees C (59 and 86 degrees F). Protect from light. Throw away any unused medicine after the expiration date.  NOTE: This sheet is a summary. It may not cover all possible information. If you have questions about this medicine, talk to your doctor, pharmacist, or health care provider.  © 2015, [employer]vier/[employer]. (2010-03-21 17:20:26)

## 2015-03-04 ENCOUNTER — Encounter: Payer: Self-pay | Admitting: Physical Therapy

## 2015-03-04 ENCOUNTER — Ambulatory Visit: Payer: 59 | Admitting: Physical Therapy

## 2015-03-04 DIAGNOSIS — M5412 Radiculopathy, cervical region: Secondary | ICD-10-CM

## 2015-03-04 DIAGNOSIS — M7580 Other shoulder lesions, unspecified shoulder: Secondary | ICD-10-CM | POA: Diagnosis not present

## 2015-03-04 DIAGNOSIS — R29898 Other symptoms and signs involving the musculoskeletal system: Secondary | ICD-10-CM

## 2015-03-04 DIAGNOSIS — M25619 Stiffness of unspecified shoulder, not elsewhere classified: Secondary | ICD-10-CM

## 2015-03-04 DIAGNOSIS — M436 Torticollis: Secondary | ICD-10-CM

## 2015-03-04 NOTE — Therapy (Signed)
[place] [facility] MAIN [facility] SERVICES [address] [place], [place], [address] Phone: [phone]   Fax:  [phone]  Physical Therapy Treatment  Patient Details  Name: Monica Hull MRN: [id number] Date of Birth: December 26, 1960 Referring Provider:  Dorcas Carrow, DO  Encounter Date: 03/04/2015      PT End of Session - 03/04/15 1020    Visit Number 4   Number of Visits 17   Date for PT Re-Evaluation 04/12/15   PT Start Time 0845   PT Stop Time 0918   PT Time Calculation (min) 33 min   Activity Tolerance Patient tolerated treatment well   Behavior During Therapy [facility] for tasks assessed/performed      Past Medical History  Diagnosis Date  . Asthma   . GERD (gastroesophageal reflux disease)   . Hyperlipidemia   . Hypertension   . Impaired fasting glucose   . COPD (chronic obstructive pulmonary disease)   . Tobacco abuse   . Elevated liver function tests   . Carpal tunnel syndrome   . Sleep apnea     Not using CPAP- referred to sleep center  . Diabetes mellitus without complication     Past Surgical History  Procedure Laterality Date  . Cesarean section      X2  . Carpal tunnel release      There were no vitals filed for this visit.  Visit Diagnosis:  Decreased range of motion (ROM) of shoulder  Stiffness of cervical spine  Weakness of both arms  Cervical radiculopathy      Subjective Assessment - 03/04/15 0846    Subjective Patient states that she is having a rough morning. She states that she has not been able to sleep for the past 2 nights due to pain in her neck and BUE while lying on her side. Pain is 5/10 upon arrival to PT but was up to a 9/10 last night.  Patient also fell on Wednesday 03/02/15 and landed on her arms, causing increased pain in the neck and BUE.    Pertinent History History of COPD. High blood pressure, tobacco use, reports that she is pre-diabetic. Also has a history of Arthitis, DDD, DJD in the  cervical spine.    Limitations Sitting;Lifting   How long can you sit comfortably? A few minutes    How long can you stand comfortably? Able to stand as long as needed    How long can you walk comfortably? able to walk as far as needed    Diagnostic tests MRI - Joint to have DDD and foraminal stenosis bilaterally C4-C5 and C6-C7, formanial stenosis on the L C5-C6. Broad based disc bulge noted C4-C7.    Patient Stated Goals Decrease pain, sleep better, increase strength in bilateral UE    Currently in Pain? Yes   Pain Score 5    Pain Location Arm   Pain Orientation Right;Left   Pain Descriptors / Indicators Tightness;Heaviness   Pain Type Chronic pain   Pain Onset More than a month ago   Pain Frequency Constant   Aggravating Factors  sleeping on side    Pain Relieving Factors pain meds         Treatment  BUE arm bike x2 minutes. PT provided verbal instruction to decrease shoulder shrug and improved erect posture and decreased forward head  BUE low row 2x10  BUE mid row 2x10  BUE shoulder extension 2x10  Supine chin tucks 3x12  Patient required moderate verbal and tactile  instruction for proper exercise form including proper cervical movement with chin tucks and decreased shoulder shrug with postural exercises. Cues also provided to maintain improved cervical posture.   Patient instructed in proper sleep positioning.  Patient reported significant increase in pain in to get into R sidelying and with roll to L side 6/10.  Patient instructed in log roll technique to R and L x3 and Log roll to get in and out of bed. Slight decrease in pain with proper technique. Patient reports that pain subsides in supine position and in semirecumbent position. Due to MD recommendation to not sleep in supine secondary to sleep apnea, patient educated in the benefit of sleeping in a semirecumbent position to reduce neck and arm pain.       PT performed Mechanical traction to the cervical spine for  10 minutes. 15 second pull at 20# and 10 second rest at 10#. Patient reports decrease tingling in hand and decreased pain in arms following traction from 5/10 to 2/10.                        PT Education - 03/04/15 1019    Education provided Yes   Education Details Positioning, postural strengthening, mechanical traction   Person(s) Educated Patient   Methods Explanation;Demonstration;Verbal cues;Tactile cues   Comprehension Verbalized understanding;Returned demonstration;Verbal cues required;Tactile cues required             PT Long Term Goals - 02/16/15 0759    PT LONG TERM GOAL #1   Title Patient will be independent with HEP to increase UE and Cervical ROM by 04/12/2015 to increase function within the home.    Time 8   Period Weeks   Status New   PT LONG TERM GOAL #2   Title Patient will improve NDI to <15% impaired to indicate increased function with ADLs.     Time 8   Period Weeks   Status New   PT LONG TERM GOAL #3   Title Patient will increase bilateral shoulder flexion and abduction to greater than 150 degrees to improve ability to reach object on a high shelf by 04/12/15.    Time 8   Period Weeks   Status New   PT LONG TERM GOAL #4   Title Patient will increase bilateral shoulder and elbow strength to 5/5 to allow patient lift heavy objects with tasks at home by 04/12/15.    Time 8   Period Weeks   Status New   PT LONG TERM GOAL #5   Title Patient will increase cervical rotation to 60 degrees bilaterally to improve function with driving by 13/24/40.    Time 8   Period Weeks   Status New               Plan - 03/04/15 1020    Clinical Impression Statement Patient was instructed in postural exercises and positioning. PT required to provide moderate verbal and tactile instruction for proper exercise form including decreased shoulder shrug, proper cervical retraction motion and proper UE movement. PT instructed patient in the importance of  proper cervical alignment while sleeping, and encouraged patient to minimize side lying and to try sleeping in a semi-recumbent position to reduce neck and arm pain. PT also encouraged patient to wear C-PAP as instructed by MD. Mechanical traction performed following therex; patient reported significant decrease in BUE pain and numbness to 2/10 from 5/10. Continued skilled PT is recommended to increase motion, increase strength and reduce  pain to allow patient to return to PLOF.     Pt will benefit from skilled therapeutic intervention in order to improve on the following deficits Decreased activity tolerance;Decreased endurance;Decreased mobility;Decreased range of motion;Decreased strength;Hypomobility;Increased fascial restricitons;Impaired flexibility;Impaired sensation;Impaired UE functional use;Pain;Improper body mechanics;Postural dysfunction   Rehab Potential Good   Clinical Impairments Affecting Rehab Potential Positive: recent onset of pain, age, family support. Negative: Bilateral UE affected, co-morbidities.     PT Frequency 2x / week   PT Duration 8 weeks   PT Treatment/Interventions Cryotherapy;ADLs/Self Care Home Management;Electrical Stimulation;Moist Heat;Traction;Ultrasound;Therapeutic activities;Functional mobility training;Therapeutic exercise;Patient/family education;Manual techniques;Passive range of motion;Dry needling   PT Next Visit Plan Increase HEP, positioning and posture education   PT Home Exercise Plan Continue as given   Consulted and Agree with Plan of Care Patient        Problem List Patient Active Problem List   Diagnosis Date Noted  . Arm pain, inferior 02/01/2015  . Paresthesias 02/01/2015  . DJD (degenerative joint disease), cervical 02/01/2015  . COPD exacerbation 12/21/2014  . Stress incontinence 12/21/2014  . Chest pain 10/20/2014  . Asthma   . GERD (gastroesophageal reflux disease)   . Sleep apnea   . Hyperlipidemia   . Hypertension   . Impaired  fasting glucose   . COPD (chronic obstructive pulmonary disease)   . Tobacco abuse    Grier Rocher SPT 03/04/2015   11:00 AM  This entire session was performed under direct supervision and direction of a licensed therapist . I have personally read, edited and approve of the note as written.  Hopkins,Margaret PT, DPT 03/04/2015, 11:00 AM  [place] [facility] MAIN [facility] SERVICES [address] [place], [place], [address] Phone: [phone]   Fax:  [phone]

## 2015-03-07 ENCOUNTER — Encounter: Payer: Self-pay | Admitting: Family Medicine

## 2015-03-07 ENCOUNTER — Ambulatory Visit (INDEPENDENT_AMBULATORY_CARE_PROVIDER_SITE_OTHER): Payer: 59 | Admitting: Family Medicine

## 2015-03-07 VITALS — BP 155/91 | HR 77 | Temp 97.8°F | Wt 191.0 lb

## 2015-03-07 DIAGNOSIS — J441 Chronic obstructive pulmonary disease with (acute) exacerbation: Secondary | ICD-10-CM

## 2015-03-07 DIAGNOSIS — Z23 Encounter for immunization: Secondary | ICD-10-CM | POA: Diagnosis not present

## 2015-03-07 DIAGNOSIS — I10 Essential (primary) hypertension: Secondary | ICD-10-CM

## 2015-03-07 DIAGNOSIS — K219 Gastro-esophageal reflux disease without esophagitis: Secondary | ICD-10-CM | POA: Diagnosis not present

## 2015-03-07 MED ORDER — PREDNISONE 10 MG PO TABS: ORAL_TABLET | ORAL | Status: DC

## 2015-03-07 MED ORDER — AMOXICILLIN-POT CLAVULANATE 875-125 MG PO TABS: 1.0000 | ORAL_TABLET | Freq: Two times a day (BID) | ORAL | Status: DC

## 2015-03-07 NOTE — Assessment & Plan Note (Signed)
Will recheck blood pressure later during the week after she takes her scheduled lisinopril If blood pressure not improved will prescribe additional medication for blood pressure management

## 2015-03-07 NOTE — Assessment & Plan Note (Addendum)
Prescribed Augmentin and Prednisone Will continue taking Chantix for smoking cessation Return in 2 weeks for lung recheck with spiro

## 2015-03-07 NOTE — Assessment & Plan Note (Addendum)
Informed to notify office if chest pressure worsens or quality changes will schedule an appointment with her Cardiologist

## 2015-03-07 NOTE — Progress Notes (Signed)
BP 155/91 mmHg  Pulse 77  Temp(Src) 97.8 F (36.6 C)  Wt 191 lb (86.637 kg)  SpO2 97%   Subjective:    Patient ID: Monica Hull, female    DOB: Jan 08, 1961, 54 y.o.   MRN: [id number]  HPI: Monica Hull is a 54 y.o. female seen today with NP student  Chief Complaint  Patient presents with  . Hypertension  . Nicotine Dependence    Doen to one pack per day, she was at 2 packs a day  . Gastrophageal Reflux  Hypertension Hypertension status: uncontrolled did not take medication today usually takes medication during lunch  Satisfied with current treatment: yes BP monitoring frequency: does not check BP medication side effects: no Medication compliance: excellent compliance Pertinent negatives denies headaches, diplopia, visual changes. lower extremity edema, lightheadedness  Cough for 1 week c/o shortness of breath, wheezing, and nonproductive cough.  Precipitating factors include activity.  Relieving factors include rest.  She has not taken any medication for symptoms.  Pertinent negatives denies fever, chills, chest congestion, postnasal drip, sneezing, swollen lymph nodes, sinus pain or pressure, headache, ear pain or drainage, vomiting, nausea, diarrhea, or contact with anyone who has been sick.         GERD Having non-radiating intermittent  midsternal chest pressure for 1 week.  Denies chest pain or palpitations she states she thinks the pressure is secondary to GERD.  Nothing makes the symptoms better or worse.  Smoking Cessation Currently taking Chantix without complications.  She has decreased smoking from 2 ppd to 1 ppd of cigarettes  Relevant past medical, surgical, family and social history reviewed and updated as indicated. Interim medical history since our last visit reviewed. Allergies and medications reviewed and updated.  Review of Systems  Constitutional: Negative for fever, chills, fatigue and unexpected weight change.  Cardiovascular: Negative for  palpitations and leg swelling.  Neurological: Negative for dizziness, tremors, seizures, speech difficulty, weakness, light-headedness and headaches.       Has a hx of syncopal episodes. Numbness bilateral arms    Per HPI unless specifically indicated above     Objective:    BP 155/91 mmHg  Pulse 77  Temp(Src) 97.8 F (36.6 C)  Wt 191 lb (86.637 kg)  SpO2 97%  Wt Readings from Last 3 Encounters:  03/07/15 191 lb (86.637 kg)  03/03/15 192 lb 1.6 oz (87.136 kg)  02/08/15 191 lb (86.637 kg)    Physical Exam  Constitutional: She is oriented to person, place, and time. She appears well-developed and well-nourished.  HENT:  Head: Normocephalic and atraumatic.  Right Ear: Hearing, tympanic membrane, external ear and ear canal normal.  Left Ear: Hearing, tympanic membrane, external ear and ear canal normal.  Nose: Nose normal.  Eyes: Right eye exhibits no discharge. Left eye exhibits no discharge.  Neck: Normal range of motion.  Cardiovascular: Normal rate, regular rhythm, normal heart sounds and intact distal pulses.   Pulmonary/Chest: Effort normal. No accessory muscle usage. No respiratory distress. She exhibits no tenderness.  She has wheezes and rhonchi  Musculoskeletal: Normal range of motion. She exhibits no edema or tenderness.  Lymphadenopathy:    She has no cervical adenopathy.  Neurological: She is alert and oriented to person, place, and time.  Skin: Skin is warm and intact. She is not diaphoretic.  Psychiatric: She has a normal mood and affect. Her behavior is normal. Judgment and thought content normal.    Results for orders placed or performed in visit on 02/01/15  Basic metabolic panel  Result Value Ref Range   Glucose 126 (H) 65 - 99 mg/dL   BUN 7 6 - 24 mg/dL   Creatinine, Ser 1.61 0.57 - 1.00 mg/dL   GFR calc non Af Amer 96 >59 mL/min/1.73   GFR calc Af Amer 111 >59 mL/min/1.73   BUN/Creatinine Ratio 10 9 - 23   Sodium 142 134 - 144 mmol/L   Potassium 4.3  3.5 - 5.2 mmol/L   Chloride 101 97 - 108 mmol/L   CO2 23 18 - 29 mmol/L   Calcium 9.4 8.7 - 10.2 mg/dL  Hepatitis C Antibody  Result Value Ref Range   Hep C Virus Ab <0.1 0.0 - 0.9 s/co ratio      Assessment & Plan:   Problem List Items Addressed This Visit      Cardiovascular and Mediastinum   Hypertension - Primary    Will recheck blood pressure later during the week after she takes her scheduled lisinopril If blood pressure not improved will prescribe additional medication for blood pressure management        Respiratory   COPD exacerbation    Prescribed Augmentin and Prednisone Will continue taking Chantix for smoking cessation Return in 2 weeks for lung recheck with spiro      Relevant Medications   predniSONE (DELTASONE) 10 MG tablet     Digestive   GERD (gastroesophageal reflux disease)    Informed to notify office if chest pressure worsens or quality changes will schedule an appointment with her Cardiologist       Other Visit Diagnoses    Immunization due        Relevant Orders    Flu Vaccine QUAD 36+ mos PF IM (Fluarix & Fluzone Quad PF) (Completed)        Follow up plan: Return in about 2 weeks (around 03/21/2015).

## 2015-03-08 ENCOUNTER — Encounter: Payer: Self-pay | Admitting: Physical Therapy

## 2015-03-08 ENCOUNTER — Ambulatory Visit: Payer: 59 | Admitting: Physical Therapy

## 2015-03-08 DIAGNOSIS — M5412 Radiculopathy, cervical region: Secondary | ICD-10-CM

## 2015-03-08 DIAGNOSIS — R29898 Other symptoms and signs involving the musculoskeletal system: Secondary | ICD-10-CM

## 2015-03-08 DIAGNOSIS — M25619 Stiffness of unspecified shoulder, not elsewhere classified: Secondary | ICD-10-CM

## 2015-03-08 DIAGNOSIS — M7580 Other shoulder lesions, unspecified shoulder: Secondary | ICD-10-CM | POA: Diagnosis not present

## 2015-03-08 DIAGNOSIS — M436 Torticollis: Secondary | ICD-10-CM

## 2015-03-08 NOTE — Therapy (Signed)
[facility] MAIN [facility] SERVICES [address] [place], [place], [address] Phone: [phone]   Fax:  [phone]  Physical Therapy Treatment  Patient Details  Name: Monica Hull MRN: [id number] Date of Birth: 10-07-1960 Referring Provider:  Dorcas Carrow, DO  Encounter Date: 03/08/2015      PT End of Session - 03/08/15 1416    Visit Number 5   Number of Visits 17   Date for PT Re-Evaluation 04/12/15   PT Start Time 0930   PT Stop Time 1016   PT Time Calculation (min) 46 min   Activity Tolerance Patient tolerated treatment well   Behavior During Therapy [facility] for tasks assessed/performed      Past Medical History  Diagnosis Date  . Asthma   . GERD (gastroesophageal reflux disease)   . Hyperlipidemia   . Hypertension   . Impaired fasting glucose   . COPD (chronic obstructive pulmonary disease)   . Tobacco abuse   . Elevated liver function tests   . Carpal tunnel syndrome   . Sleep apnea     Not using CPAP- referred to sleep center  . Diabetes mellitus without complication     Past Surgical History  Procedure Laterality Date  . Cesarean section      X2  . Carpal tunnel release      There were no vitals filed for this visit.  Visit Diagnosis:  Decreased range of motion (ROM) of shoulder  Stiffness of cervical spine  Weakness of both arms  Cervical radiculopathy      Subjective Assessment - 03/08/15 0938    Subjective Patient reports that she is doing well upon arrival to PT. She states that she had a very good weekend with slight increase in pain at night. She reports that her pain is 2/10 in the shoulder and upper trap bilaterally at the start of PT. She also states that she will be starting antibiotic and steriod medication in the near future for COPD.    Pertinent History History of COPD. High blood pressure, tobacco use, reports that she is pre-diabetic. Also has a history of Arthitis, DDD, DJD in the cervical  spine.    Limitations Sitting;Lifting   How long can you sit comfortably? A few minutes    How long can you stand comfortably? Able to stand as long as needed    How long can you walk comfortably? able to walk as far as needed    Diagnostic tests MRI - Joint to have DDD and foraminal stenosis bilaterally C4-C5 and C6-C7, formanial stenosis on the L C5-C6. Broad based disc bulge noted C4-C7.    Patient Stated Goals Decrease pain, sleep better, increase strength in bilateral UE    Currently in Pain? Yes   Pain Score 2    Pain Location Shoulder   Pain Orientation Right;Left   Pain Descriptors / Indicators Dull   Pain Type Chronic pain   Pain Radiating Towards Bilatreral UE    Pain Onset More than a month ago   Pain Frequency Constant   Aggravating Factors  sleeping.    Pain Relieving Factors pain meds             Treatment  BUE arm bike x2 minutes. PT provided verbal instruction to decrease shoulder shrug and improved erect posture and decreased forward head  HEP re-education  Later flexion R and L with 10 second hold x10 Cervical rotation R and L x10  Chin tucks x10  Min verbal instruction provided for proper ROM and decrease accessory movement of the shoulder, as well as proper hold time to increase stretching.    Self cervical distraction with towel roll. 5 x10 second hold    Red tband seated therex  BUE Shoulder flexion 2x10  BUE Scapular retraction 2x10  Shoulder diagonals  2x10 each direction  BUE Scapular protraction 2x10   Patient noted to have difficulty with overhead movements including shoulder diagonals and flexion with increased shoulder shrug in bilaterally R>L. PT provided mod verbal and tactile instruction to decrease shoulder shrug, increase scapular movement and decrease compensation of the elbow. Patient demonstrated moderate response to instruction.    PT performed Mechanical traction to the cervical spine for 12 minutes. 15 second pull at 25# and 10  second rest at 10#. Patient reports centralization of symptoms with pain 2/10 and no symptoms distal to the neck.                       PT Education - 03/08/15 1415    Education provided Yes   Education Details UE/postural strengthening, HEP re-education, mechanical traction   Person(s) Educated Patient   Methods Explanation;Demonstration;Tactile cues;Verbal cues   Comprehension Verbalized understanding;Returned demonstration;Verbal cues required;Tactile cues required             PT Long Term Goals - 02/16/15 0759    PT LONG TERM GOAL #1   Title Patient will be independent with HEP to increase UE and Cervical ROM by 04/12/2015 to increase function within the home.    Time 8   Period Weeks   Status New   PT LONG TERM GOAL #2   Title Patient will improve NDI to <15% impaired to indicate increased function with ADLs.     Time 8   Period Weeks   Status New   PT LONG TERM GOAL #3   Title Patient will increase bilateral shoulder flexion and abduction to greater than 150 degrees to improve ability to reach object on a high shelf by 04/12/15.    Time 8   Period Weeks   Status New   PT LONG TERM GOAL #4   Title Patient will increase bilateral shoulder and elbow strength to 5/5 to allow patient lift heavy objects with tasks at home by 04/12/15.    Time 8   Period Weeks   Status New   PT LONG TERM GOAL #5   Title Patient will increase cervical rotation to 60 degrees bilaterally to improve function with driving by 16/10/96.    Time 8   Period Weeks   Status New               Plan - 03/08/15 1416    Clinical Impression Statement Patient instructed in postural exercises as well as cervical ROM exercises and UE strengthening. PT required to provide min Verbal instruction for improved exercise technique including decreased compensatory shoulder shrug and elbow movement, as well as increased hold time with cervical motions to increase stretch of movement. Patient  responded moderately to instruction from PT. Only slight increase in pain noted with UE/postural therex. Mechanical traction performed following therex; patient reports centralization of symptoms with no pain/tingling distal to the neck. Continued skilled PT is recommended to increase motion, increase strength, and reduce pain to allow return to PLOF.   Pt will benefit from skilled therapeutic intervention in order to improve on the following deficits Decreased activity tolerance;Decreased endurance;Decreased mobility;Decreased range of motion;Decreased strength;Hypomobility;Increased fascial  restricitons;Impaired flexibility;Impaired sensation;Impaired UE functional use;Pain;Improper body mechanics;Postural dysfunction   Rehab Potential Good   Clinical Impairments Affecting Rehab Potential Positive: recent onset of pain, age, family support. Negative: Bilateral UE affected, co-morbidities.     PT Frequency 2x / week   PT Duration 8 weeks   PT Treatment/Interventions Cryotherapy;ADLs/Self Care Home Management;Electrical Stimulation;Moist Heat;Traction;Ultrasound;Therapeutic activities;Functional mobility training;Therapeutic exercise;Patient/family education;Manual techniques;Passive range of motion;Dry needling   PT Next Visit Plan Increase HEP, positioning and posture education   PT Home Exercise Plan Continue as given   Consulted and Agree with Plan of Care Patient        Problem List Patient Active Problem List   Diagnosis Date Noted  . Arm pain, inferior 02/01/2015  . Paresthesias 02/01/2015  . DJD (degenerative joint disease), cervical 02/01/2015  . COPD exacerbation 12/21/2014  . Stress incontinence 12/21/2014  . Chest pain 10/20/2014  . Asthma   . GERD (gastroesophageal reflux disease)   . Sleep apnea   . Hyperlipidemia   . Hypertension   . Impaired fasting glucose   . COPD (chronic obstructive pulmonary disease)   . Tobacco abuse     Grier Rocher SPT 03/08/2015   3:18 PM   This entire session was performed under direct supervision and direction of a licensed therapist. I have personally read, edited and approve of the note as written.  Hopkins,Margaret PT, DPT 03/08/2015, 3:18 PM  [facility] MAIN [facility] SERVICES [address] [place], [place], [address] Phone: [phone]   Fax:  [phone]

## 2015-03-10 ENCOUNTER — Ambulatory Visit (INDEPENDENT_AMBULATORY_CARE_PROVIDER_SITE_OTHER): Payer: 59

## 2015-03-10 ENCOUNTER — Ambulatory Visit: Payer: 59 | Admitting: Physical Therapy

## 2015-03-10 ENCOUNTER — Encounter: Payer: Self-pay | Admitting: Physical Therapy

## 2015-03-10 VITALS — BP 128/85 | HR 88

## 2015-03-10 DIAGNOSIS — Z0130 Encounter for examination of blood pressure without abnormal findings: Secondary | ICD-10-CM

## 2015-03-10 DIAGNOSIS — M5412 Radiculopathy, cervical region: Secondary | ICD-10-CM

## 2015-03-10 DIAGNOSIS — R29898 Other symptoms and signs involving the musculoskeletal system: Secondary | ICD-10-CM

## 2015-03-10 DIAGNOSIS — M7580 Other shoulder lesions, unspecified shoulder: Secondary | ICD-10-CM | POA: Diagnosis not present

## 2015-03-10 DIAGNOSIS — Z136 Encounter for screening for cardiovascular disorders: Secondary | ICD-10-CM

## 2015-03-10 DIAGNOSIS — M436 Torticollis: Secondary | ICD-10-CM

## 2015-03-10 DIAGNOSIS — M25619 Stiffness of unspecified shoulder, not elsewhere classified: Secondary | ICD-10-CM

## 2015-03-10 NOTE — Therapy (Addendum)
[place] [facility] MAIN [facility] SERVICES [address] [place], [place], [address] Phone: [phone]   Fax:  [phone]  Physical Therapy Treatment  Patient Details  Name: Monica Hull MRN: [id number] Date of Birth: 1961-02-19 Referring Provider:  Dorcas Carrow, DO  Encounter Date: 03/10/2015      PT End of Session - 03/10/15 1142    Visit Number 6   Number of Visits 17   Date for PT Re-Evaluation 04/12/15   PT Start Time 1100   PT Stop Time 1148   PT Time Calculation (min) 48 min   Activity Tolerance Patient tolerated treatment well   Behavior During Therapy [facility] for tasks assessed/performed      Past Medical History  Diagnosis Date  . Asthma   . GERD (gastroesophageal reflux disease)   . Hyperlipidemia   . Hypertension   . Impaired fasting glucose   . COPD (chronic obstructive pulmonary disease)   . Tobacco abuse   . Elevated liver function tests   . Carpal tunnel syndrome   . Sleep apnea     Not using CPAP- referred to sleep center  . Diabetes mellitus without complication     Past Surgical History  Procedure Laterality Date  . Cesarean section      X2  . Carpal tunnel release      There were no vitals filed for this visit.  Visit Diagnosis:  Decreased range of motion (ROM) of shoulder  Stiffness of cervical spine  Weakness of both arms  Cervical radiculopathy      Subjective Assessment - 03/10/15 1118    Subjective Patient reports that she is doing okay upon arrival to PT. States that she has more tingling in the hands but less pain in the arms upon arrival to PT. She states that she continues to get very little sleep at night while lying on her side due to neck/arm pain and tingling in bilateral hands.   Pertinent History History of COPD. High blood pressure, tobacco use, reports that she is pre-diabetic. 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    Limitations Sitting;Lifting   How long  can you sit comfortably? A few minutes    How long can you stand comfortably? Able to stand as long as needed    How long can you walk comfortably? able to walk as far as needed    Diagnostic tests MRI - Joint to have DDD and foraminal stenosis bilaterally C4-C5 and C6-C7, formanial stenosis on the L C5-C6. Broad based disc bulge noted C4-C7.    Patient Stated Goals Decrease pain, sleep better, increase strength in bilateral UE    Currently in Pain? Yes   Pain Score 4    Pain Location Hand   Pain Orientation Right;Left   Pain Descriptors / Indicators Tingling   Pain Type Chronic pain   Pain Onset More than a month ago   Pain Frequency Intermittent       Treatment:   BUE arm bike level 2, 3 minutes (unbilled)    Bed mobility Patient provided education for sleeping position. Patient instructed to utilize less pillows while in bed to maintain proper neck posture and reduce pain at night.  Patient educated in bed mobility to decrease irritation of neck while getting into sleeping position. Min verbal instruction provided to decrease trunk and cervical rotation as to maintain neutral lateral cervical alignment. Patient responded well to instruction   Therex   Seated  Chin tuck 3x12  Seated lateral cervical flexion 3x8 bilaterally  Self Scalene /upper trap stretch 4 x15 seconds bilaterally Low rows 2x8 red tband  Shoulder extension / eccentric shoulder flexion, red tband 2x8 each UE  Shoulder pulleys into shoulder flexion x45 seconds   PT provided min-mod verbal instruction for proper exercise technique including improve quality of movement with chin tucks, proper position for stretches and postural exercises. Cues also provided to decrease shoulder shrug to reduce tension and improve quality of movement.  Patient reports decreased tingling and arm pain following scalene stretch. Education provided to increase frequency of scalene stretch to 3-4 times a day.      PT performed  Mechanical traction to the cervical spine for 14 minutes. 15 second pull at 25# and 10 second rest at 10#. Patient reports centralization of symptoms with no pain and no numbness/tingling following mechanical traction.                        PT Education - 03/10/15 1331    Education provided Yes   Education Details postural strengthening, Cervical ROM, mechanical traction.    Person(s) Educated Patient   Methods Explanation;Demonstration;Verbal cues;Tactile cues   Comprehension Verbalized understanding;Returned demonstration;Verbal cues required;Tactile cues required             PT Long Term Goals - 02/16/15 0759    PT LONG TERM GOAL #1   Title Patient will be independent with HEP to increase UE and Cervical ROM by 04/12/2015 to increase function within the home.    Time 8   Period Weeks   Status New   PT LONG TERM GOAL #2   Title Patient will improve NDI to <15% impaired to indicate increased function with ADLs.     Time 8   Period Weeks   Status New   PT LONG TERM GOAL #3   Title Patient will increase bilateral shoulder flexion and abduction to greater than 150 degrees to improve ability to reach object on a high shelf by 04/12/15.    Time 8   Period Weeks   Status New   PT LONG TERM GOAL #4   Title Patient will increase bilateral shoulder and elbow strength to 5/5 to allow patient lift heavy objects with tasks at home by 04/12/15.    Time 8   Period Weeks   Status New   PT LONG TERM GOAL #5   Title Patient will increase cervical rotation to 60 degrees bilaterally to improve function with driving by 16/10/96.    Time 8   Period Weeks   Status New               Plan - 03/10/15 1332    Clinical Impression Statement Patient instructed in postural strengthening exercises, cervical ROM/stretching, and sleep positioning. PT provided min verbal and tactile instruction for proper exercise technique including increased cervical retraction with chin  tucks, decreased shoulder shrug with strengthening, and proper positioning for all cervical stretches. Patient responded well to instruction and reported no tingling in hands or arm pain following upper trap/scalene stretches. Patient instructed to perform lateral cervical flexion stretch 3-4 times a day to reduce pain in arms and tingling in hands. PT also performed mechanical traction to the cervical spine; patient reports no pain in the cervical spine or tingling reported in the hands following mechanical traction. Continued skilled PT is recommended to improve cervical movement, increase UE strength, and decrease pain and tingling to improve  function with tasks in the home.   Pt will benefit from skilled therapeutic intervention in order to improve on the following deficits Decreased activity tolerance;Decreased endurance;Decreased mobility;Decreased range of motion;Decreased strength;Hypomobility;Increased fascial restricitons;Impaired flexibility;Impaired sensation;Impaired UE functional use;Pain;Improper body mechanics;Postural dysfunction   Rehab Potential Good   Clinical Impairments Affecting Rehab Potential Positive: recent onset of pain, age, family support. Negative: Bilateral UE affected, co-morbidities.     PT Frequency 2x / week   PT Duration 8 weeks   PT Treatment/Interventions Cryotherapy;ADLs/Self Care Home Management;Electrical Stimulation;Moist Heat;Traction;Ultrasound;Therapeutic activities;Functional mobility training;Therapeutic exercise;Patient/family education;Manual techniques;Passive range of motion;Dry needling   PT Next Visit Plan increase postural exercises, review stretches   PT Home Exercise Plan Continue as given   Consulted and Agree with Plan of Care Patient        Problem List Patient Active Problem List   Diagnosis Date Noted  . Arm pain, inferior 02/01/2015  . Paresthesias 02/01/2015  . DJD (degenerative joint disease), cervical 02/01/2015  . COPD  exacerbation 12/21/2014  . Stress incontinence 12/21/2014  . Chest pain 10/20/2014  . Asthma   . GERD (gastroesophageal reflux disease)   . Sleep apnea   . Hyperlipidemia   . Hypertension   . Impaired fasting glucose   . COPD (chronic obstructive pulmonary disease)   . Tobacco abuse    Grier Rocher SPT 03/11/2015   11:50 AM   Alva Garnet 03/11/2015, 11:50 AM   This entire session was performed under direct supervision and direction of a licensed therapist/therapist assistant . I have personally read, edited and approve of the note as written.   Kerin Ransom, PT, DPT    [place] [facility] MAIN [facility] SERVICES [address] [place], [place], [address] Phone: [phone]   Fax:  [phone]

## 2015-03-15 ENCOUNTER — Other Ambulatory Visit: Payer: Self-pay | Admitting: Obstetrics and Gynecology

## 2015-03-15 ENCOUNTER — Encounter: Payer: Self-pay | Admitting: Physical Therapy

## 2015-03-15 ENCOUNTER — Ambulatory Visit: Payer: 59 | Admitting: Physical Therapy

## 2015-03-15 DIAGNOSIS — M436 Torticollis: Secondary | ICD-10-CM

## 2015-03-15 DIAGNOSIS — M7580 Other shoulder lesions, unspecified shoulder: Secondary | ICD-10-CM | POA: Diagnosis not present

## 2015-03-15 DIAGNOSIS — R29898 Other symptoms and signs involving the musculoskeletal system: Secondary | ICD-10-CM

## 2015-03-15 DIAGNOSIS — M25619 Stiffness of unspecified shoulder, not elsewhere classified: Secondary | ICD-10-CM

## 2015-03-15 MED ORDER — FESOTERODINE FUMARATE ER 4 MG PO TB24: 4.0000 mg | ORAL_TABLET | Freq: Every day | ORAL | Status: DC

## 2015-03-15 NOTE — Therapy (Signed)
[place] [facility] MAIN [facility] SERVICES [address] [place], [place], [address] Phone: [phone]   Fax:  [phone]  Physical Therapy Treatment  Patient Details  Name: Monica Hull MRN: [id number] Date of Birth: [birth year] Referring Provider:  Dorcas Carrow, DO  Encounter Date: 03/15/2015      PT End of Session - 03/15/15 1058    Visit Number 7   Number of Visits 17   Date for PT Re-Evaluation 04/12/15   PT Start Time 0937   PT Stop Time 1016   PT Time Calculation (min) 39 min   Activity Tolerance Patient tolerated treatment well   Behavior During Therapy [facility] for tasks assessed/performed      Past Medical History  Diagnosis Date  . Asthma   . GERD (gastroesophageal reflux disease)   . Hyperlipidemia   . Hypertension   . Impaired fasting glucose   . COPD (chronic obstructive pulmonary disease)   . Tobacco abuse   . Elevated liver function tests   . Carpal tunnel syndrome   . Sleep apnea     Not using CPAP- referred to sleep center  . Diabetes mellitus without complication     Past Surgical History  Procedure Laterality Date  . Cesarean section      X2  . Carpal tunnel release      There were no vitals filed for this visit.  Visit Diagnosis:  Decreased range of motion (ROM) of shoulder  Stiffness of cervical spine  Weakness of both arms      Subjective Assessment - 03/15/15 0941    Subjective Patient reports that she is doing well upon arrival to PT. She states that she has some pain in bilateral elbows 2/10. She states that she went to the gym and exercised her arms yesterday, with pain in bilateral UE following the exercises (5/10). She states that she has NCV testing on 9/22, which will cause her to miss her next appointment.    Pertinent History History of COPD. High blood pressure, tobacco use, reports that she is pre-diabetic. 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    Limitations  Sitting;Lifting   How long can you sit comfortably? A few minutes    How long can you stand comfortably? Able to stand as long as needed    How long can you walk comfortably? able to walk as far as needed    Diagnostic tests MRI - Joint to have DDD and foraminal stenosis bilaterally C4-C5 and C6-C7, formanial stenosis on the L C5-C6. Broad based disc bulge noted C4-C7.    Patient Stated Goals Decrease pain, sleep better, increase strength in bilateral UE    Currently in Pain? Yes   Pain Score 2    Pain Location Elbow   Pain Orientation Right;Left   Pain Descriptors / Indicators Aching   Pain Onset More than a month ago   Pain Frequency Intermittent         BUE arm bike level 2, 2 minutes,  min verbal instruction to reduce forward head posture and decrease tension in shoulders.    Therex  Seated Chin tuck 3x12  Self Scalene /upper trap stretch 4 x20 seconds bilaterally BUE Seated shoulder extension red tband 2x12  BUE Low rows 2x8 red tband  Shoulder extension/eccentric shoulder flexion, red tband 2x8 each UE  BUE Wall slides 2x10  Shoulder flexion from wall slides . 2x8   PT provided min verbal and tactile  instruction for improve exercise technique including increased hold time with self stretched, proper exercise position. Patient responded well to instruction with decreased compensation from the elbow and the trunk, but states that she has increased tingling in B UE with low rows and shoulder extensions.      PT performed Mechanical traction to the cervical spine for 15 minutes. 15 second pull at 28# and 10 second rest at 10#. Patient reports centralization of symptoms with no pain and no numbness/tingling following mechanical traction. She does state that she has slight increase in low back pain following mechanical traction. 2/10                        PT Education - 03/15/15 1013    Education provided Yes   Education Details Postural strengthening,  self stretching, mechanical traction.    Person(s) Educated Patient   Methods Explanation;Demonstration;Tactile cues;Verbal cues   Comprehension Verbalized understanding;Returned demonstration;Verbal cues required             PT Long Term Goals - 02/16/15 0759    PT LONG TERM GOAL #1   Title Patient will be independent with HEP to increase UE and Cervical ROM by 04/12/2015 to increase function within the home.    Time 8   Period Weeks   Status New   PT LONG TERM GOAL #2   Title Patient will improve NDI to <15% impaired to indicate increased function with ADLs.     Time 8   Period Weeks   Status New   PT LONG TERM GOAL #3   Title Patient will increase bilateral shoulder flexion and abduction to greater than 150 degrees to improve ability to reach object on a high shelf by 04/12/15.    Time 8   Period Weeks   Status New   PT LONG TERM GOAL #4   Title Patient will increase bilateral shoulder and elbow strength to 5/5 to allow patient lift heavy objects with tasks at home by 04/12/15.    Time 8   Period Weeks   Status New   PT LONG TERM GOAL #5   Title Patient will increase cervical rotation to 60 degrees bilaterally to improve function with driving by 16/10/96.    Time 8   Period Weeks   Status New               Plan - 03/15/15 1014    Clinical Impression Statement Patient instructed in postural exercises as well as proper home stretches. Patient required min-moderate verbal and tactile instruction for proper exercise set up and decreased compensatory movement of the trunk and elbow, as well as instruction to increase hold time and proper positioning. Patient responded well to instruction with improved exercise technique. Patient reports slight increase tingling in her fingers with low rows and shoulder extension exercises, and slight decrease in tingling with scalene stretches. Patient reports no pain in her neck or tingling in her hands following mechanical traction,  but reports slight increase in low back pain (2/10). Continued skilled PT is recommended to increase UE ROM, decrease pain, improve posture to allow patient to return to PLOF.    Pt will benefit from skilled therapeutic intervention in order to improve on the following deficits Decreased activity tolerance;Decreased endurance;Decreased mobility;Decreased range of motion;Decreased strength;Hypomobility;Increased fascial restricitons;Impaired flexibility;Impaired sensation;Impaired UE functional use;Pain;Improper body mechanics;Postural dysfunction   Rehab Potential Good   Clinical Impairments Affecting Rehab Potential Positive: recent onset of pain, age, family support.  Negative: Bilateral UE affected, co-morbidities.     PT Frequency 2x / week   PT Duration 8 weeks   PT Treatment/Interventions Cryotherapy;ADLs/Self Care Home Management;Electrical Stimulation;Moist Heat;Traction;Ultrasound;Therapeutic activities;Functional mobility training;Therapeutic exercise;Patient/family education;Manual techniques;Passive range of motion;Dry needling   PT Next Visit Plan advance HEP with postural exercises. traction.    PT Home Exercise Plan Continue as given   Consulted and Agree with Plan of Care Patient        Problem List Patient Active Problem List   Diagnosis Date Noted  . Arm pain, inferior 02/01/2015  . Paresthesias 02/01/2015  . DJD (degenerative joint disease), cervical 02/01/2015  . COPD exacerbation 12/21/2014  . Stress incontinence 12/21/2014  . Chest pain 10/20/2014  . Asthma   . GERD (gastroesophageal reflux disease)   . Sleep apnea   . Hyperlipidemia   . Hypertension   . Impaired fasting glucose   . COPD (chronic obstructive pulmonary disease)   . Tobacco abuse    Grier Rocher SPT 03/16/2015   2:47 PM  This entire session was performed under direct supervision and direction of a licensed therapist . I have personally read, edited and approve of the note as  written.  Hopkins,Margaret PT, DPT 03/16/2015, 2:47 PM  [place] [facility] MAIN [facility] SERVICES [address] [place], [place], [address] Phone: [phone]   Fax:  [phone]

## 2015-03-15 NOTE — Progress Notes (Signed)
Received notice from patient's pharmacy that Detrol not covered by insurance. Will change to Toviaz 4 mg daily.  Prescription e-scribed to pharmacy.

## 2015-03-17 ENCOUNTER — Ambulatory Visit: Payer: 59 | Admitting: Physical Therapy

## 2015-03-21 ENCOUNTER — Ambulatory Visit
Admission: RE | Admit: 2015-03-21 | Discharge: 2015-03-21 | Disposition: A | Discharge status: Home / Self Care | Payer: 59 | Source: Ambulatory Visit | Attending: Family Medicine | Admitting: Family Medicine

## 2015-03-21 ENCOUNTER — Ambulatory Visit (INDEPENDENT_AMBULATORY_CARE_PROVIDER_SITE_OTHER): Payer: 59 | Admitting: Family Medicine

## 2015-03-21 ENCOUNTER — Telehealth: Payer: Self-pay | Admitting: Family Medicine

## 2015-03-21 ENCOUNTER — Encounter: Payer: Self-pay | Admitting: Family Medicine

## 2015-03-21 DIAGNOSIS — R062 Wheezing: Secondary | ICD-10-CM

## 2015-03-21 DIAGNOSIS — K219 Gastro-esophageal reflux disease without esophagitis: Secondary | ICD-10-CM

## 2015-03-21 DIAGNOSIS — R0989 Other specified symptoms and signs involving the circulatory and respiratory systems: Secondary | ICD-10-CM | POA: Diagnosis not present

## 2015-03-21 DIAGNOSIS — M5030 Other cervical disc degeneration, unspecified cervical region: Secondary | ICD-10-CM | POA: Diagnosis not present

## 2015-03-21 DIAGNOSIS — M47812 Spondylosis without myelopathy or radiculopathy, cervical region: Secondary | ICD-10-CM

## 2015-03-21 DIAGNOSIS — J438 Other emphysema: Secondary | ICD-10-CM

## 2015-03-21 MED ORDER — DICLOFENAC SODIUM 75 MG PO TBEC: 75.0000 mg | DELAYED_RELEASE_TABLET | Freq: Two times a day (BID) | ORAL | Status: DC | PRN

## 2015-03-21 MED ORDER — PANTOPRAZOLE SODIUM 40 MG PO TBEC: 40.0000 mg | DELAYED_RELEASE_TABLET | Freq: Every day | ORAL | Status: DC

## 2015-03-21 MED ORDER — DOXYCYCLINE HYCLATE 100 MG PO TABS: 100.0000 mg | ORAL_TABLET | Freq: Two times a day (BID) | ORAL | Status: DC

## 2015-03-21 NOTE — Progress Notes (Signed)
BP 122/85 mmHg  Pulse 65  Temp(Src) 98.5 F (36.9 C)  Ht  (1.575 m)  Wt 191 lb (86.637 kg)  BMI 34.93 kg/m2  SpO2 96%   Subjective:    Patient ID: Monica Hull, female    DOB: 07-02-1960, 54 y.o.   MRN: [id number]  HPI: Monica Hull is a 54 y.o. female  Chief Complaint  Patient presents with  . lung recheck  . Arm Pain    patient needs a refill on diclofenac   . Gastrophageal Reflux    patient needs a refill on pantoprazole   Bronchitis- finished up medicine for COPD exacerbation. Feeling much better and back to herself.  COPD status: controlled Satisfied with current treatment?: yes Oxygen use: no Dyspnea frequency: rare Cough frequency: rare Rescue inhaler frequency: not at all   Limitation of activity: no Productive cough: No Last Spirometry: Today, normal Pneumovax: Up to Date Influenza: Up to Date  Arm pain doing well with diclofenac. Seeing pain management in October. Needs refill GERD under good control on pantoprazole. Needs refill  Relevant past medical, surgical, family and social history reviewed and updated as indicated. Interim medical history since our last visit reviewed. Allergies and medications reviewed and updated.  Review of Systems  Constitutional: Negative.   Respiratory: Negative.   Cardiovascular: Negative.   Gastrointestinal: Negative.   Musculoskeletal: Negative.   Psychiatric/Behavioral: Negative.     Per HPI unless specifically indicated above     Objective:    BP 122/85 mmHg  Pulse 65  Temp(Src) 98.5 F (36.9 C)  Ht  (1.575 m)  Wt 191 lb (86.637 kg)  BMI 34.93 kg/m2  SpO2 96%  Wt Readings from Last 3 Encounters:  03/21/15 191 lb (86.637 kg)  03/07/15 191 lb (86.637 kg)  03/03/15 192 lb 1.6 oz (87.136 kg)    Physical Exam  Constitutional: She is oriented to person, place, and time. She appears well-developed and well-nourished. No distress.  HENT:  Head: Normocephalic and atraumatic.  Right Ear:  Hearing normal.  Left Ear: Hearing normal.  Nose: Nose normal.  Eyes: Conjunctivae and lids are normal. Right eye exhibits no discharge. Left eye exhibits no discharge. No scleral icterus.  Cardiovascular: Normal rate, regular rhythm, normal heart sounds and intact distal pulses.  Exam reveals no gallop and no friction rub.   No murmur heard. Pulmonary/Chest: Effort normal. No respiratory distress. She has decreased breath sounds. She has no wheezes. She has rhonchi in the left lower field. She has no rales.  Musculoskeletal: Normal range of motion.  Neurological: She is alert and oriented to person, place, and time.  Skin: Skin is warm, dry and intact. No rash noted. No erythema. No pallor.  Psychiatric: She has a normal mood and affect. Her speech is normal and behavior is normal. Judgment and thought content normal. Cognition and memory are normal.  Nursing note and vitals reviewed.   Results for orders placed or performed in visit on 02/01/15  Basic metabolic panel  Result Value Ref Range   Glucose 126 (H) 65 - 99 mg/dL   BUN 7 6 - 24 mg/dL   Creatinine, Ser 4.09 0.57 - 1.00 mg/dL   GFR calc non Af Amer 96 >59 mL/min/1.73   GFR calc Af Amer 111 >59 mL/min/1.73   BUN/Creatinine Ratio 10 9 - 23   Sodium 142 134 - 144 mmol/L   Potassium 4.3 3.5 - 5.2 mmol/L   Chloride 101 97 - 108 mmol/L  CO2 23 18 - 29 mmol/L   Calcium 9.4 8.7 - 10.2 mg/dL  Hepatitis C Antibody  Result Value Ref Range   Hep C Virus Ab <0.1 0.0 - 0.9 s/co ratio      Assessment & Plan:   Problem List Items Addressed This Visit      Respiratory   COPD (chronic obstructive pulmonary disease)    Normal spiro today. Will hold on inhaler at this time. CXR today due to rhonchi at [facility]. Await results. Treat as needed. Continue to monitor.         Digestive   GERD (gastroesophageal reflux disease)    Under good control. Refill of pantoprazole given. Continue to monitor.       Relevant Medications    pantoprazole (PROTONIX) 40 MG tablet     Musculoskeletal and Integument   DJD (degenerative joint disease), cervical    Refill of diclofenac given today. Follow up with pain management as needed.       Relevant Medications   diclofenac (VOLTAREN) 75 MG EC tablet    Other Visit Diagnoses    Rhonchi        Relevant Orders    DG Chest 2 View    Wheezing        Relevant Orders    Spirometry with Graph (Completed)        Follow up plan: Return in about 3 months (around 06/20/2015) for DM follow up.

## 2015-03-21 NOTE — Assessment & Plan Note (Signed)
Under good control. Refill of pantoprazole given. Continue to monitor.

## 2015-03-21 NOTE — Assessment & Plan Note (Signed)
Refill of diclofenac given today. Follow up with pain management as needed.

## 2015-03-21 NOTE — Assessment & Plan Note (Signed)
Normal spiro today. Will hold on inhaler at this time. CXR today due to rhonchi at [facility]. Await results. Treat as needed. Continue to monitor.

## 2015-03-21 NOTE — Telephone Encounter (Signed)
Called patient with the results of x-ray. Bronchitic changes. Was treated with augmentin. Will treat with doxy as she is allergic to macrolids. Continue to monitor and call with any problems.

## 2015-03-22 ENCOUNTER — Ambulatory Visit: Payer: 59 | Admitting: Physical Therapy

## 2015-03-24 ENCOUNTER — Ambulatory Visit: Payer: 59 | Admitting: Physical Therapy

## 2015-03-29 ENCOUNTER — Ambulatory Visit: Payer: 59 | Attending: Family Medicine | Admitting: Physical Therapy

## 2015-03-29 DIAGNOSIS — M5412 Radiculopathy, cervical region: Secondary | ICD-10-CM | POA: Diagnosis present

## 2015-03-29 DIAGNOSIS — M25619 Stiffness of unspecified shoulder, not elsewhere classified: Secondary | ICD-10-CM

## 2015-03-29 DIAGNOSIS — M7580 Other shoulder lesions, unspecified shoulder: Secondary | ICD-10-CM | POA: Insufficient documentation

## 2015-03-29 DIAGNOSIS — R29898 Other symptoms and signs involving the musculoskeletal system: Secondary | ICD-10-CM | POA: Diagnosis present

## 2015-03-29 DIAGNOSIS — M436 Torticollis: Secondary | ICD-10-CM | POA: Diagnosis present

## 2015-03-29 NOTE — Therapy (Signed)
[place] MAIN [facility] SERVICES [address] [place], [place], [address] Phone: [phone]   Fax:  [phone]  Physical Therapy Treatment/Discharge summary 02/15/15 - 03/29/15  Patient Details  Name: Monica Hull MRN: [id number] Date of Birth: 1960-08-07 Referring Provider:  Valerie Roys, DO  Encounter Date: 03/29/2015      PT End of Session - 03/29/15 1012    Visit Number 8   Number of Visits 17   Date for PT Re-Evaluation 04/12/15   PT Start Time 0935   PT Stop Time 1026   PT Time Calculation (min) 51 min   Activity Tolerance Patient tolerated treatment well;Patient limited by pain   Behavior During Therapy [facility] for tasks assessed/performed      Past Medical History  Diagnosis Date  . Asthma   . GERD (gastroesophageal reflux disease)   . Hyperlipidemia   . Hypertension   . Impaired fasting glucose   . COPD (chronic obstructive pulmonary disease) ([place])   . Tobacco abuse   . Elevated liver function tests   . Carpal tunnel syndrome   . Sleep apnea     Not using CPAP- referred to sleep center  . Diabetes mellitus without complication [facility])     Past Surgical History  Procedure Laterality Date  . Cesarean section      X2  . Carpal tunnel release      There were no vitals filed for this visit.  Visit Diagnosis:  Decreased range of motion (ROM) of shoulder  Stiffness of cervical spine  Weakness of both arms  Cervical radiculopathy      Subjective Assessment - 03/29/15 0941    Subjective Patinet States that she would like to discontinue PT. She reports that she saw a neurologist last week that has recommended spinal surgery to the Cervical region; which the patient declined. The neurologist also recommended a home cervical traction unit to help reduce Radicular symptoms throughout the arms. She also reports that she wants to stop PT because she is going to start Injections to decrease inflamation in the cervical  region on 04/19/15.    Pertinent History History of COPD. High blood pressure, tobacco use, reports that she is pre-diabetic. 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    Limitations Sitting;Lifting   How long can you sit comfortably? A few minutes    How long can you stand comfortably? Able to stand as long as needed    How long can you walk comfortably? able to walk as far as needed    Diagnostic tests MRI - Joint to have DDD and foraminal stenosis bilaterally C4-C5 and C6-C7, formanial stenosis on the L C5-C6. Broad based disc bulge noted C4-C7.    Patient Stated Goals Decrease pain, sleep better, increase strength in bilateral UE    Currently in Pain? Yes   Pain Score 9    Pain Location Arm   Pain Orientation Right;Left   Pain Descriptors / Indicators Aching   Pain Type Chronic pain   Pain Radiating Towards Bilateral UE    Pain Frequency Constant   Aggravating Factors  sleeping             [facility] PT Assessment - 03/29/15 0946    Observation/Other Assessments   Neck Disability Index  38% impaired (increased disability from 26% impaired at Eval. )   Posture/Postural Control   Posture Comments Patient sits and stands with significant forward head posture and increased thoracic  kyphosis.    AROM   Right Shoulder Extension 35 Degrees   Right Shoulder Flexion 117 Degrees   Right Shoulder ABduction 100 Degrees   Left Shoulder Extension 40 Degrees   Left Shoulder Flexion 125 Degrees   Left Shoulder ABduction 112 Degrees   Cervical - Right Rotation 35   Cervical - Left Rotation 32   Strength   Right Shoulder Flexion 4+/5   Right Shoulder ABduction 4+/5   Right Shoulder Internal Rotation 4+/5   Right Shoulder External Rotation 4+/5   Left Shoulder Flexion 4+/5   Left Shoulder ABduction 4+/5   Left Shoulder Internal Rotation 4+/5   Left Shoulder External Rotation 4+/5   Right Elbow Flexion 5/5   Right Elbow Extension 5/5   Left Elbow Flexion 5/5   Left  Elbow Extension 5/5         Treatment:   BUE arm bike 2 minutes, level 3, (unbilled)   HEP re-education  Chin tucks 2 x10  Cervical rotation x10 each direction Upper trap stretch 2x 15 seconds each side   Low row with red tband x10  Shoulder extension with red band x10    PT provided moderate verbal instruction for improve posture with all exercises as well as correct technique and improve postioning to decrease pain. Patient responded moderately to instruction.   PT performed mechanical traction to the cervical spine for 12 minutes, 20 second pull (25#), 10 second rest (6#). Patient reports decreased pain in BUE from 9/10 to 6/10 and no tingling in either hand following traction. Patient monitored throughout entire traction treatment; no adverse affects reported of observed.    PT instructed patient in standardized outcome measure including the NDI, ROM, and strength assessment to assess progress with PT. See above:                PT Education - 03/29/15 1015    Education provided Yes   Education Details Discharge plan. benefit of continued home exercises.    Person(s) Educated Patient   Methods Explanation;Demonstration;Tactile cues;Verbal cues   Comprehension Verbalized understanding;Returned demonstration;Verbal cues required             PT Long Term Goals - 03/29/15 0951    PT LONG TERM GOAL #1   Title Patient will be independent with HEP to increase UE and Cervical ROM by 04/12/2015 to increase function within the home.    Time 8   Period Weeks   Status On-going   PT LONG TERM GOAL #2   Title Patient will improve NDI to <15% impaired to indicate increased function with ADLs.     Time 8   Period Weeks   Status Not Met   PT LONG TERM GOAL #3   Title Patient will increase bilateral shoulder flexion and abduction to greater than 150 degrees to improve ability to reach object on a high shelf by 04/12/15.    Time 8   Period Weeks   Status Not Met    PT LONG TERM GOAL #4   Title Patient will increase bilateral shoulder and elbow strength to 5/5 to allow patient lift heavy objects with tasks at home by 04/12/15.    Time 8   Period Weeks   Status Not Met   PT LONG TERM GOAL #5   Title Patient will increase cervical rotation to 60 degrees bilaterally to improve function with driving by 04/27/14.    Time 8   Period Weeks   Status Not Met  Plan - 03/29/15 1047    Clinical Impression Statement Patient goals assessed on this day by PT. PT instructed patient in manual muscle testing, ROM assessment, and the NDI. Patient demonstrated decreased ROM of shoulder movement in Bilateral UE and cervical movement; increased pain/tingling into the UE with shoulder and cervical movement. UE strength remained constant or decreased; increased radicular pain to the elbow with all shoulder motion. PT re-educated patient in postural exercises and cervical ROM exercises to be completed at home, and the benefit of continued home exercises. Moderate verbal instruction required for proper exercise technique and decreased compensation. PT performed mechanical traction to the cervical spine. Following traction patient reports a decreased in pain from 9/10 to 6/10 and no tingling in the hands. Based on decreased function found on the NDI, as well as decreased ROM and strength, continued skilled PT is no longer recommended. Patient instructed to follow up with PCP and Neurologist for continued plan of care.   Pt will benefit from skilled therapeutic intervention in order to improve on the following deficits Decreased activity tolerance;Decreased endurance;Decreased mobility;Decreased range of motion;Decreased strength;Hypomobility;Increased fascial restricitons;Impaired flexibility;Impaired sensation;Impaired UE functional use;Pain;Improper body mechanics;Postural dysfunction   Rehab Potential Good   Clinical Impairments Affecting Rehab Potential Positive:  recent onset of pain, age, family support. Negative: Bilateral UE affected, co-morbidities.     PT Frequency 2x / week   PT Duration 8 weeks   PT Treatment/Interventions Cryotherapy;ADLs/Self Care Home Management;Electrical Stimulation;Moist Heat;Traction;Ultrasound;Therapeutic activities;Functional mobility training;Therapeutic exercise;Patient/family education;Manual techniques;Passive range of motion;Dry needling   PT Next Visit Plan Patient D/C from PT   PT Home Exercise Plan --   Consulted and Agree with Plan of Care Patient        Problem List Patient Active Problem List   Diagnosis Date Noted  . Arm pain, inferior 02/01/2015  . Paresthesias 02/01/2015  . DJD (degenerative joint disease), cervical 02/01/2015  . Stress incontinence 12/21/2014  . Chest pain 10/20/2014  . Asthma   . GERD (gastroesophageal reflux disease)   . Sleep apnea   . Hyperlipidemia   . Hypertension   . Impaired fasting glucose   . COPD (chronic obstructive pulmonary disease) ([place])   . Tobacco abuse    Barrie Folk SPT 03/30/2015   2:29 PM  This entire session was performed under direct supervision and direction of a licensed therapist . I have personally read, edited and approve of the note as written.  Hopkins,Margaret PT, DPT 03/30/2015, 2:29 PM  [place] MAIN [facility] SERVICES [address] [place], [place], [address] Phone: [phone]   Fax:  [phone]

## 2015-03-30 ENCOUNTER — Encounter: Payer: Self-pay | Admitting: Physical Therapy

## 2015-03-30 ENCOUNTER — Ambulatory Visit (INDEPENDENT_AMBULATORY_CARE_PROVIDER_SITE_OTHER): Payer: 59 | Admitting: Obstetrics and Gynecology

## 2015-03-30 ENCOUNTER — Encounter: Payer: Self-pay | Admitting: Obstetrics and Gynecology

## 2015-03-30 VITALS — BP 148/97 | HR 70 | Temp 98.5°F | Resp 14 | Ht 62.0 in | Wt 192.6 lb

## 2015-03-30 DIAGNOSIS — E669 Obesity, unspecified: Secondary | ICD-10-CM

## 2015-03-30 DIAGNOSIS — Z720 Tobacco use: Secondary | ICD-10-CM | POA: Diagnosis not present

## 2015-03-30 DIAGNOSIS — N3946 Mixed incontinence: Secondary | ICD-10-CM

## 2015-03-30 NOTE — Progress Notes (Signed)
GYNECOLOGY PROGRESS NOTE  Subjective:    Patient ID: Monica Hull, female    DOB: 1960/11/22, 54 y.o.   MRN: [id number]  HPI  Patient is a 54 y.o. P40 female who presents for f/u of mixed urinary incontinence.  Still implementing lifestyle modifications.  Is currently down to slightly less than 1 ppd on Chantix (previously smoking 2ppd).  Continues to decrease caffeineated beverages to 3 per day (previously 6-8) and attempting to consume more water. Is working on weight loss (exercises 3-4 times weekly, consuming more fruits and vegetables).  Was initiated on Toviaz last visit (initially prescribed Detrol, however insurance would not cover it).  Has been using x 2 weeks.  Does note some improvement in symptoms.   The following portions of the patient's history were reviewed and updated as appropriate: allergies, current medications, past family history, past medical history, past social history, past surgical history and problem list.  Review of Systems Pertinent items are noted in HPI.   Objective:   Blood pressure 148/97, pulse 70, temperature 98.5 F (36.9 C), temperature source Oral, resp. rate 14, height  (1.575 m), weight 192 lb 9.6 oz (87.363 kg). Body mass index is 35.22 kg/(m^2).  General appearance: alert and no distress Exam deferred.   Assessment:   Mixed urinary incontinence Tobacco abuse Obesity  Plan:   Continue to encourage lifestyle modifications.  Continue Toviaz 4 mg for urinary urges and nocturia x 2 additional weeks, then can increase to 8 mg x 4 weeks.   To reassess symptoms at next visit in 6 weeks on medication.  If stress component still a major component, can discuss use of pessary vs surgery (sling).   Encouraged patient to try OTC Poise Impressa to see if symptoms imiprove.   Hildred Laser, MD [facility]

## 2015-03-31 ENCOUNTER — Ambulatory Visit: Payer: 59 | Admitting: Physical Therapy

## 2015-04-05 ENCOUNTER — Ambulatory Visit: Payer: 59 | Admitting: Physical Therapy

## 2015-04-07 ENCOUNTER — Ambulatory Visit: Payer: 59 | Admitting: Physical Therapy

## 2015-04-12 ENCOUNTER — Ambulatory Visit: Payer: 59 | Admitting: Physical Therapy

## 2015-04-14 ENCOUNTER — Ambulatory Visit: Payer: 59 | Admitting: Physical Therapy

## 2015-04-22 LAB — HM DIABETES EYE EXAM

## 2015-04-26 ENCOUNTER — Encounter: Payer: Self-pay | Admitting: Family Medicine

## 2015-05-11 ENCOUNTER — Ambulatory Visit (INDEPENDENT_AMBULATORY_CARE_PROVIDER_SITE_OTHER): Payer: 59 | Admitting: Obstetrics and Gynecology

## 2015-05-11 ENCOUNTER — Encounter: Payer: Self-pay | Admitting: Obstetrics and Gynecology

## 2015-05-11 VITALS — BP 117/75 | HR 82 | Ht 62.0 in | Wt 193.9 lb

## 2015-05-11 DIAGNOSIS — Z716 Tobacco abuse counseling: Secondary | ICD-10-CM | POA: Diagnosis not present

## 2015-05-11 DIAGNOSIS — N3946 Mixed incontinence: Secondary | ICD-10-CM | POA: Diagnosis not present

## 2015-05-11 DIAGNOSIS — Z720 Tobacco use: Secondary | ICD-10-CM

## 2015-05-11 MED ORDER — NICOTINE 21 MG/24HR TD PT24: 21.0000 mg | MEDICATED_PATCH | Freq: Every day | TRANSDERMAL | Status: DC

## 2015-05-11 MED ORDER — FESOTERODINE FUMARATE ER 8 MG PO TB24: 8.0000 mg | ORAL_TABLET | Freq: Every day | ORAL | Status: DC

## 2015-05-13 NOTE — Progress Notes (Signed)
GYNECOLOGY PROGRESS NOTE  Subjective:    Patient ID: Monica Hull, female    DOB: 1961-02-02, 54 y.o.   MRN: [id number]  HPI  Patient is a 54 y.o. 18P2002 female who presents for f/u of mixed urinary incontinence.  Still implementing lifestyle modifications.  Is back up to 1 ppd on Chantix.  Notes that she hit a plateau and has now begun to increase cigarette usage again.  Still desires to quit.    Continues to consume caffeineated beverages (~ 3 per day (previously 6-8)), however is also attempting to consume more water. Notes some improvement on Toviaz, however is feeling more pressure symptoms.   The following portions of the patient's history were reviewed and updated as appropriate: allergies, current medications, past family history, past medical history, past social history, past surgical history and problem list.  Review of Systems Pertinent items are noted in HPI.   Objective:   Blood pressure 117/75, pulse 82, height 5\' 2"  (1.575 m), weight 193 lb 14.4 oz (87.952 kg). Body mass index is 35.46 kg/(m^2).  General appearance: alert and no distress Exam deferred.    Assessment:   Mixed urinary incontinence Tobacco abuse Obesity  Plan:   Continue to encourage lifestyle modifications.  Can increase Toviaz to 8 mg.   To reassess symptoms at next visit in 6 weeks on medication.  If stress component still a major component, can discuss use of pessary vs surgery (sling).  Smoking counseling performed, patient desires to use nicotine patches.  Will prescribe.  Started on 21 mg for 6 weeks, then will need to decrease to 14 mg next visit (will need printed prescription).  Desires to set quit date for next visit, however discussed that weaning process on patch takes time.  Should aim for more short term goal of decreasing nicotine intake by half.   Monica LaserAnika Jillana Selph, MD [facility]

## 2015-05-24 ENCOUNTER — Encounter: Payer: Self-pay | Admitting: Family Medicine

## 2015-05-24 ENCOUNTER — Ambulatory Visit (INDEPENDENT_AMBULATORY_CARE_PROVIDER_SITE_OTHER): Payer: 59 | Admitting: Family Medicine

## 2015-05-24 VITALS — BP 150/91 | HR 69 | Temp 99.2°F | Wt 192.0 lb

## 2015-05-24 DIAGNOSIS — J441 Chronic obstructive pulmonary disease with (acute) exacerbation: Secondary | ICD-10-CM | POA: Diagnosis not present

## 2015-05-24 DIAGNOSIS — K5900 Constipation, unspecified: Secondary | ICD-10-CM

## 2015-05-24 DIAGNOSIS — I10 Essential (primary) hypertension: Secondary | ICD-10-CM

## 2015-05-24 MED ORDER — ALBUTEROL SULFATE (2.5 MG/3ML) 0.083% IN NEBU: 2.5000 mg | INHALATION_SOLUTION | Freq: Four times a day (QID) | RESPIRATORY_TRACT | Status: DC | PRN

## 2015-05-24 MED ORDER — ALBUTEROL SULFATE (2.5 MG/3ML) 0.083% IN NEBU: 2.5000 mg | INHALATION_SOLUTION | Freq: Once | RESPIRATORY_TRACT | Status: DC

## 2015-05-24 MED ORDER — HYDROCOD POLST-CPM POLST ER 10-8 MG/5ML PO SUER: 5.0000 mL | Freq: Every evening | ORAL | Status: DC | PRN

## 2015-05-24 MED ORDER — DOXYCYCLINE HYCLATE 100 MG PO TABS: 100.0000 mg | ORAL_TABLET | Freq: Two times a day (BID) | ORAL | Status: DC

## 2015-05-24 MED ORDER — PREDNISONE 10 MG PO TABS: ORAL_TABLET | ORAL | Status: DC

## 2015-05-24 MED ORDER — MAGNESIUM CITRATE PO SOLN: 1.0000 | Freq: Once | ORAL | Status: DC

## 2015-05-24 NOTE — Progress Notes (Signed)
BP 150/91 mmHg  Pulse 69  Temp(Src) 99.2 F (37.3 C)  Wt 192 lb (87.091 kg)  SpO2 98%   Subjective:    Patient ID: Monica Hull, female    DOB: September 01, 1960, 54 y.o.   MRN: [id number]  HPI: Monica Hull is a 54 y.o. female  Chief Complaint  Patient presents with  . Cough    Patient complains of a cough that gets worse at night, she states that it is starting to effect her breathing during the day   UPPER RESPIRATORY TRACT INFECTION Duration: 2-3 days Worst symptom: cough Fever: no Cough: yes Shortness of breath: yes Wheezing: yes Chest pain: yes Chest tightness: yes Chest congestion: no Nasal congestion: yes Runny nose: no Post nasal drip: yes Sneezing: no Sore throat: no Swollen glands: no Sinus pressure: no Headache: no Face pain: no Toothache: no Ear pain: no  Ear pressure: no  Eyes red/itching:no Eye drainage/crusting: no  Vomiting: no Rash: no Fatigue: yes Sick contacts: no Strep contacts: no  Context: worse Recurrent sinusitis: no Relief with OTC cold/cough medications: no  Treatments attempted: mucinex   ABDOMINAL ISSUES- has been constipated for the past couple of weeks Duration: couple of weeks Nature: bloating, crampy and full Location: epigastric and diffuse  Severity: 5/10  Radiation: no Frequency: intermittent Alleviating factors: bowel movement Aggravating factors: not having a bowel movement Treatments attempted: laxatives Constipation: yes Diarrhea: no Mucous in the stool: no Heartburn: no Bloating:yes Flatulence: yes Nausea: no Vomiting: no Episodes of vomit/day: Melena or hematochezia: no Rash: no Jaundice: no Fever: no Weight loss: no  Did not take her BP medicine today as she forgot it. Is normally very good about taking it.   Relevant past medical, surgical, family and social history reviewed and updated as indicated. Interim medical history since our last visit reviewed. Allergies and medications reviewed  and updated.  Review of Systems  Constitutional: Negative.   HENT: Negative.   Respiratory: Positive for cough, chest tightness, shortness of breath and wheezing. Negative for apnea, choking and stridor.   Cardiovascular: Negative.   Gastrointestinal: Positive for abdominal pain and constipation. Negative for nausea, vomiting, diarrhea, blood in stool, abdominal distention, anal bleeding and rectal pain.  Genitourinary: Negative.   Psychiatric/Behavioral: Negative.     Per HPI unless specifically indicated above     Objective:    BP 150/91 mmHg  Pulse 69  Temp(Src) 99.2 F (37.3 C)  Wt 192 lb (87.091 kg)  SpO2 98%  Wt Readings from Last 3 Encounters:  05/24/15 192 lb (87.091 kg)  05/11/15 193 lb 14.4 oz (87.952 kg)  03/30/15 192 lb 9.6 oz (87.363 kg)    Physical Exam  Constitutional: She is oriented to person, place, and time. She appears well-developed and well-nourished. No distress.  HENT:  Head: Normocephalic and atraumatic.  Right Ear: Hearing and external ear normal.  Left Ear: Hearing and external ear normal.  Nose: Nose normal.  Mouth/Throat: Oropharynx is clear and moist. No oropharyngeal exudate.  Eyes: Conjunctivae, EOM and lids are normal. Pupils are equal, round, and reactive to light. Right eye exhibits no discharge. Left eye exhibits no discharge. No scleral icterus.  Neck: Normal range of motion. Neck supple. No JVD present. No tracheal deviation present. No thyromegaly present.  Cardiovascular: Normal rate, regular rhythm, normal heart sounds and intact distal pulses.  Exam reveals no gallop and no friction rub.   No murmur heard. Pulmonary/Chest: Effort normal. No stridor. No respiratory distress. She has decreased breath  sounds. She has wheezes. She has rhonchi in the right upper field, the right middle field, the right lower field, the left upper field, the left middle field and the left lower field. She has no rales. She exhibits no tenderness.   Musculoskeletal: Normal range of motion.  Lymphadenopathy:    She has cervical adenopathy.  Neurological: She is alert and oriented to person, place, and time.  Skin: Skin is warm, dry and intact. No rash noted. She is not diaphoretic. No erythema. No pallor.  Psychiatric: She has a normal mood and affect. Her speech is normal and behavior is normal. Judgment and thought content normal. Cognition and memory are normal.  Vitals reviewed. Lungs better after recheck, but continue with rhonchi and wheezes especially on the R  Results for orders placed or performed in visit on 04/26/15  HM DIABETES EYE EXAM  Result Value Ref Range   HM Diabetic Eye Exam No Retinopathy No Retinopathy      Assessment & Plan:   Problem List Items Addressed This Visit      Cardiovascular and Mediastinum   Hypertension    Will take meds and check in 2 weeks at lung check.        Other Visit Diagnoses    COPD exacerbation (HCC)    -  Primary    Will treat with doxycyline and prednisone. Tussionex for comfort. Continue to monitor.     Relevant Medications    albuterol (PROVENTIL) (2.5 MG/3ML) 0.083% nebulizer solution 2.5 mg    predniSONE (DELTASONE) 10 MG tablet    chlorpheniramine-HYDROcodone (TUSSIONEX PENNKINETIC ER) 10-8 MG/5ML SUER    albuterol (PROVENTIL) (2.5 MG/3ML) 0.083% nebulizer solution    Constipation, unspecified constipation type        Will start mag citrate. Advised her increase fiber and water. Continue to monitor.         Follow up plan: Return in about 2 weeks (around 06/07/2015) for Lung recheck and BP recheck.

## 2015-05-24 NOTE — Assessment & Plan Note (Signed)
Will take meds and check in 2 weeks at lung check.

## 2015-06-02 ENCOUNTER — Ambulatory Visit (INDEPENDENT_AMBULATORY_CARE_PROVIDER_SITE_OTHER): Payer: 59 | Admitting: Obstetrics and Gynecology

## 2015-06-02 ENCOUNTER — Encounter: Payer: Self-pay | Admitting: Obstetrics and Gynecology

## 2015-06-02 VITALS — BP 106/70 | HR 71 | Ht 64.0 in | Wt 189.0 lb

## 2015-06-02 DIAGNOSIS — N950 Postmenopausal bleeding: Secondary | ICD-10-CM

## 2015-06-02 DIAGNOSIS — R102 Pelvic and perineal pain: Secondary | ICD-10-CM | POA: Diagnosis not present

## 2015-06-02 LAB — POCT URINALYSIS DIPSTICK
BILIRUBIN UA: NEGATIVE
Glucose, UA: NEGATIVE
Ketones, UA: NEGATIVE
LEUKOCYTES UA: NEGATIVE
NITRITE UA: NEGATIVE
PH UA: 6
PROTEIN UA: NEGATIVE
Spec Grav, UA: 1.02
UROBILINOGEN UA: NEGATIVE

## 2015-06-06 LAB — PATHOLOGY

## 2015-06-06 NOTE — Progress Notes (Signed)
GYNECOLOGY PROGRESS NOTE  Subjective:    Patient ID: Monica Hull, female    DOB: Apr 02, 1961, 54 y.o.   MRN: [id number]  HPI  Patient is a 54 y.o. 21P2002 postmenopausal female who presents for complaints of pelvic pain (cramping) and vaginal bleeding when wiping x 2 days.  Denies dysuria.  Has h/o mixed incontinence, so still experiences some leakage of urine and urgency.   The following portions of the patient's history were reviewed and updated as appropriate: allergies, current medications, past family history, past medical history, past social history, past surgical history and problem list.  Review of Systems Pertinent items noted in HPI and remainder of comprehensive ROS otherwise negative.   Objective:   Blood pressure 106/70, pulse 71, height 5\' 4"  (1.626 m), weight 189 lb (85.73 kg). General appearance: alert and mild distress Abdomen: normal findings: bowel sounds normal, no masses palpable and soft and abnormal findings:  mild tenderness in suprapubic region.  No flank tenderness bilaterally. Pelvic: external genitalia normal, no adnexal masses or tenderness, no cervical motion tenderness, positive findings: small amount of dark red blood in vaginal vault, originating from cervix.  Vaginal mucosa normal, rectovaginal septum normal and uterus normal size, shape, and consistency Extremities: extremities normal, atraumatic, no cyanosis or edema Neurologic: Grossly normal   Labs: Results for orders placed or performed in visit on 06/02/15  POCT Urinalysis Dipstick  Result Value Ref Range   Color, UA Yellow    Clarity, UA clear    Glucose, UA neg    Bilirubin, UA neg    Ketones, UA neg    Spec Grav, UA 1.020    Blood, UA Large    pH, UA 6.0    Protein, UA neg    Urobilinogen, UA negative    Nitrite, UA neg    Leukocytes, UA Negative Negative    Assessment:   Postmenopausal bleeding Pelvic pain  Plan:   Endometrial biopsy performed today.  Will order pelvic  ultrasound to assess endometrial cavity and stripe. Advised on Ibuprofen or Tylenol for pain as needed.  UA with blood (likely vaginal source), no nitrites.  Will still send for culture but not likely a UTI as cause of pain.  RTC in 2-3 weeks for discussion of results and ultrasound.    Endometrial Biopsy Procedure Note  The patient is positioned on the exam table in the dorsal lithotomy position. Bimanual exam confirms uterine position and size. A Graves speculum is placed into the vagina. A single toothed tenaculum is placed onto the anterior lip of the cervix. The pipette is placed into the endocervical canal and is advanced to the uterine fundus. Using a piston like technique, with vacuum created by withdrawing the stylus, the endometrial specimen is obtained and transferred to the biopsy container. Minimal bleeding is encountered. The procedure is well tolerated.   Uterine Position: mid   Uterine Length: 8 cm   Uterine Specimen: Average   Post procedure instructions are given. The patient is scheduled for follow up appointment.    Hildred LaserAnika Tevis Dunavan, MD [facility] 06/06/2015 11:34 AM

## 2015-06-08 ENCOUNTER — Ambulatory Visit (INDEPENDENT_AMBULATORY_CARE_PROVIDER_SITE_OTHER): Payer: 59 | Admitting: Family Medicine

## 2015-06-08 ENCOUNTER — Encounter: Payer: Self-pay | Admitting: Family Medicine

## 2015-06-08 VITALS — BP 120/80 | HR 69 | Temp 97.8°F | Ht 62.0 in | Wt 189.0 lb

## 2015-06-08 DIAGNOSIS — J438 Other emphysema: Secondary | ICD-10-CM

## 2015-06-08 DIAGNOSIS — I10 Essential (primary) hypertension: Secondary | ICD-10-CM

## 2015-06-08 NOTE — Progress Notes (Signed)
BP 120/80 mmHg  Pulse 69  Temp(Src) 97.8 F (36.6 C)  Ht 5\' 2"  (1.575 m)  Wt 189 lb (85.73 kg)  BMI 34.56 kg/m2  SpO2 98%   Subjective:    Patient ID: Monica Hull, female    DOB: 1961-05-28, 54 y.o.   MRN: [id number]  HPI: Monica AnisCynthia K Rohrman is a 54 y.o. female  Chief Complaint  Patient presents with  . Lung Recheck  . Hypertension   HYPERTENSION Hypertension status: controlled  Satisfied with current treatment? yes Duration of hypertension: chronic BP monitoring frequency:  not checking BP medication side effects:  no Medication compliance: excellent compliance Aspirin: no Recurrent headaches: no Visual changes: no Palpitations: no Dyspnea: no Chest pain: no Lower extremity edema: no Dizzy/lightheaded: no  Hasn't used her nebulizer. Not as wheezy as she was before. Feeling back to normal on her lungs  Relevant past medical, surgical, family and social history reviewed and updated as indicated. Interim medical history since our last visit reviewed. Allergies and medications reviewed and updated.  Review of Systems  Constitutional: Negative.   Respiratory: Negative.   Cardiovascular: Negative.   Psychiatric/Behavioral: Negative.     Per HPI unless specifically indicated above     Objective:    BP 120/80 mmHg  Pulse 69  Temp(Src) 97.8 F (36.6 C)  Ht 5\' 2"  (1.575 m)  Wt 189 lb (85.73 kg)  BMI 34.56 kg/m2  SpO2 98%  Wt Readings from Last 3 Encounters:  06/08/15 189 lb (85.73 kg)  06/02/15 189 lb (85.73 kg)  05/24/15 192 lb (87.091 kg)    Physical Exam  Constitutional: She is oriented to person, place, and time. She appears well-developed and well-nourished. No distress.  HENT:  Head: Normocephalic and atraumatic.  Right Ear: Hearing normal.  Left Ear: Hearing normal.  Nose: Nose normal.  Eyes: Conjunctivae and lids are normal. Right eye exhibits no discharge. Left eye exhibits no discharge. No scleral icterus.  Cardiovascular: Normal rate,  regular rhythm, normal heart sounds and intact distal pulses.  Exam reveals no gallop and no friction rub.   No murmur heard. Pulmonary/Chest: Effort normal. No respiratory distress. She has wheezes in the right upper field, the right middle field, the right lower field, the left upper field, the left middle field and the left lower field. She has rhonchi in the right lower field and the left lower field. She has no rales. She exhibits no tenderness.  Musculoskeletal: Normal range of motion.  Neurological: She is alert and oriented to person, place, and time.  Skin: Skin is warm, dry and intact. No rash noted. No erythema. No pallor.  Psychiatric: She has a normal mood and affect. Her speech is normal and behavior is normal. Judgment and thought content normal. Cognition and memory are normal.  Nursing note and vitals reviewed.   Results for orders placed or performed in visit on 06/02/15  POCT Urinalysis Dipstick  Result Value Ref Range   Color, UA Yellow    Clarity, UA clear    Glucose, UA neg    Bilirubin, UA neg    Ketones, UA neg    Spec Grav, UA 1.020    Blood, UA Large    pH, UA 6.0    Protein, UA neg    Urobilinogen, UA negative    Nitrite, UA neg    Leukocytes, UA Negative Negative  Pathology  Result Value Ref Range   PATH REPORT.SITE OF ORIGIN SPEC Comment    . Comment  PATH REPORT.FINAL DX SPEC Comment    SIGNED OUT BY: Comment    GROSS DESCRIPTION: Comment    . Comment    PAYMENT PROCEDURE Comment       Assessment & Plan:   Problem List Items Addressed This Visit      Cardiovascular and Mediastinum   Hypertension    Under good control. Continue  Current regimen. Continue to monitor.         Respiratory   COPD (chronic obstructive pulmonary disease) (HCC) - Primary    Better, still very wheezy, will restart her nebulizer. Continue to monitor. Call if acting up again.           Follow up plan: No Follow-up on file.

## 2015-06-08 NOTE — Assessment & Plan Note (Signed)
Under good control. Continue  Current regimen. Continue to monitor.

## 2015-06-08 NOTE — Assessment & Plan Note (Signed)
Better, still very wheezy, will restart her nebulizer. Continue to monitor. Call if acting up again.

## 2015-06-22 ENCOUNTER — Ambulatory Visit: Payer: 59 | Admitting: Obstetrics and Gynecology

## 2015-06-23 ENCOUNTER — Encounter: Payer: Self-pay | Admitting: Obstetrics and Gynecology

## 2015-06-23 ENCOUNTER — Ambulatory Visit (INDEPENDENT_AMBULATORY_CARE_PROVIDER_SITE_OTHER): Payer: 59 | Admitting: Obstetrics and Gynecology

## 2015-06-23 ENCOUNTER — Ambulatory Visit (INDEPENDENT_AMBULATORY_CARE_PROVIDER_SITE_OTHER): Payer: 59

## 2015-06-23 ENCOUNTER — Ambulatory Visit: Payer: 59 | Admitting: Obstetrics and Gynecology

## 2015-06-23 VITALS — BP 129/82 | HR 75 | Ht 64.0 in | Wt 192.4 lb

## 2015-06-23 DIAGNOSIS — N950 Postmenopausal bleeding: Secondary | ICD-10-CM | POA: Diagnosis not present

## 2015-06-23 DIAGNOSIS — R938 Abnormal findings on diagnostic imaging of other specified body structures: Secondary | ICD-10-CM

## 2015-06-23 DIAGNOSIS — R102 Pelvic and perineal pain: Secondary | ICD-10-CM

## 2015-06-23 DIAGNOSIS — R9389 Abnormal findings on diagnostic imaging of other specified body structures: Secondary | ICD-10-CM

## 2015-06-23 NOTE — Progress Notes (Signed)
GYNECOLOGY PROGRESS NOTE  Subjective:    Patient ID: Monica Hull, female    DOB: 01-Jan-1961, 54 y.o.   MRN: [id number]  HPI  Patient is a 54 y.o. P96 female who presents for follow up of ultrasound and biopsy results for PMB. Denies complaints today.  Notes that she is still working on her smoking, down to slightly over 1 ppd. Currently using nicotine patches. Feels that the patches are helping.   The following portions of the patient's history were reviewed and updated as appropriate: allergies, current medications, past family history, past medical history, past social history, past surgical history and problem list.  Review of Systems A comprehensive review of systems was negative.   Objective:   Blood pressure 129/82, pulse 75, height  (1.626 m), weight 192 lb 6.4 oz (87.272 kg). General appearance: alert and no distress Exam deferred today.    Pathology:  Diagnosis:  ENDOMETRIUM, BIOPSY:  ATROPHIC ENDOMETRIUM. NO HYPERPLASIA OR CARCINOMA.    Imaging (Pelvic Ultrasound 06/23/2015): Indications:PMB and Pelvic Pain Findings:  The uterus measures 7.2 x 3.4 x 4.1 cm. Echo texture is homogenous without evidence of focal masses. The Endometrium is thickened measuring 8.20mm. Multiple Nabothian Cysts are seen within the cervix with the largest measuring .93 x 1.3 x 1.4cm  The Endometrium is thickened measuring 8.21mm. Left Ovary measures 2 x 1.2 x 1.2 cm. It is normal appearance. Survey of the adnexa demonstrates no adnexal masses. There is no free fluid in the cul de sac.  Impression: 1. The Endometrium is thickened measuring 8.68mm. 2. Multiple Nabothian Cysts are seen within the cervix with the largest measuring .93 x 1.3 x 1.4cm 3. The right ovary was not visualized  Assessment:   Postmenopausal bleeding, thickened endometrium Tobacco abuse  Plan:   1) Patient with PMB, with EMB results noting atrophic endometrium, however ultrasound noting thickened  endometrium. Discussion had with patient regarding results.  Due to patient's risk factors (smoker, obese) and conflicting studies, would recommend Hysteroscopy D&C for full evaluation of endometrial cavity.  Patient notes understanding, and agrees with plan. The risks of surgery were discussed in detail with the patient including but not limited to: bleeding which may require transfusion or reoperation; infection which may require prolonged hospitalization or re-hospitalization and antibiotic therapy; injury to bowel, bladder, ureters and major vessels or other surrounding organs; need for additional procedures including laparotomy; thromboembolic phenomenon, incisional problems and other postoperative or anesthesia complications.  The postoperative expectations were also discussed in detail. All questions were answered.  She was told that she will be contacted by our surgical scheduler regarding the time and date of her surgery; routine preoperative instructions of having nothing to eat or drink after midnight on the day prior to surgery and also coming to the hospital 1.5 hours prior to her time of surgery were also emphasized.  She was told she may be called for a preoperative appointment about a week prior to surgery and will be given further preoperative instructions at that visit. Printed patient education handouts about the procedure were given to the patient to review at home.  Scheduled for 07/10/2014  2) Tobacco abuse -  Patient currently on nicotine patches for management.  Currently smoking a little over 1 ppd. Continued to encourage smoking cessation.  Advised on continuing to cut down as she is now scheduled to undergo surgery and can help decrease her risk of respiratory complications.    A total of 15 minutes were spent face-to-face with  the patient during this encounter and over half of that time dealt with counseling and coordination of care.   Hildred LaserAnika Aalijah Lanphere, MD [facility]

## 2015-06-24 ENCOUNTER — Encounter: Payer: Self-pay | Admitting: Obstetrics and Gynecology

## 2015-06-24 NOTE — H&P (Signed)
Subjective:    Patient is a 54 y.o. P55 female scheduled for Hysteroscopy D&C. Indications for procedure are postmenopausal bleeding and thickened endometrium.   Pertinent Gynecological History: Menses: post-menopausal Bleeding: post menopausal bleeding Last mammogram: normal Date: 07/2014 Last pap: normal Date: patient thinks ~ 2 years ago, was normal.   Discussed Blood/Blood Products: no   Menstrual History: Obstetric History   G2   P2   T2   P0   A0   TAB0   SAB0   E0   M0   L2     # Outcome Date GA Lbr Len/2nd Weight Sex Delivery Anes PTL Lv  2 Term      CS-LTranv     1 Term      CS-LTranv          Menarche age: 47  No LMP recorded. Patient is postmenopausal.    Past Medical History  Diagnosis Date  . Asthma   . GERD (gastroesophageal reflux disease)   . Hyperlipidemia   . Hypertension   . Impaired fasting glucose   . COPD (chronic obstructive pulmonary disease) (HCC)   . Tobacco abuse   . Elevated liver function tests   . Carpal tunnel syndrome   . Sleep apnea     Not using CPAP- referred to sleep center  . Diabetes mellitus without complication [facility])     Past Surgical History  Procedure Laterality Date  . Cesarean section      X2  . Carpal tunnel release      Social History   Social History  . Marital Status: Married    Spouse Name: N/A  . Number of Children: N/A  . Years of Education: N/A   Social History Main Topics  . Smoking status: Current Every Day Smoker -- 1.50 packs/day for 39 years    Types: Cigarettes  . Smokeless tobacco: Current User  . Alcohol Use: No  . Drug Use: No  . Sexual Activity:    Partners: Male   Other Topics Concern  . None   Social History Narrative    Family History  Problem Relation Age of Onset  . Arthritis Mother   . Asthma Mother   . Diabetes Mother   . Heart disease Mother   . Hyperlipidemia Mother   . Hypertension Mother   . Kidney disease Mother   . Lung disease Mother   . Alcohol abuse Father    . Hypertension Father   . Hyperlipidemia Father   . Diabetes Brother     Current Outpatient Prescriptions on File Prior to Visit  Medication Sig Dispense Refill  . albuterol (PROVENTIL) (2.5 MG/3ML) 0.083% nebulizer solution Take 3 mLs (2.5 mg total) by nebulization every 6 (six) hours as needed for wheezing or shortness of breath. 150 mL 1  . diclofenac (VOLTAREN) 75 MG EC tablet Take 1 tablet (75 mg total) by mouth 2 (two) times daily as needed. 60 tablet 3  . doxycycline (VIBRA-TABS) 100 MG tablet Take 1 tablet (100 mg total) by mouth 2 (two) times daily. 20 tablet 0  . fesoterodine (TOVIAZ) 8 MG TB24 tablet Take 1 tablet (8 mg total) by mouth daily. 30 tablet 11  . lisinopril (PRINIVIL,ZESTRIL) 20 MG tablet Take 1 tablet (20 mg total) by mouth daily. 30 tablet 1  . magnesium citrate SOLN Take 296 mLs (1 Bottle total) by mouth once. 195 mL 1  . nicotine (NICODERM CQ) 21 mg/24hr patch Place 1 patch (21 mg total) onto the skin  daily. 28 patch 2  . pantoprazole (PROTONIX) 40 MG tablet Take 1 tablet (40 mg total) by mouth daily. 30 tablet 6   Current Facility-Administered Medications on File Prior to Visit  Medication Dose Route Frequency Provider Last Rate Last Dose  . albuterol (PROVENTIL) (2.5 MG/3ML) 0.083% nebulizer solution 2.5 mg  2.5 mg Nebulization Once Nationwide Mutual InsuranceMegan P Johnson, DO         Allergies  Allergen Reactions  . Biaxin [Clarithromycin] Nausea And Vomiting and Rash    Review of Systems Constitutional: No recent fever/chills/sweats Respiratory: No recent cough/bronchitis Cardiovascular: No chest pain Gastrointestinal: No recent nausea/vomiting/diarrhea Genitourinary: No UTI symptoms Hematologic/lymphatic:No history of coagulopathy or recent blood thinner use    Objective:    BP 129/82 mmHg  Pulse 75  Ht 5\' 4"  (1.626 m)  Wt 192 lb 6.4 oz (87.272 kg)  BMI 33.01 kg/m2  General:   Normal  Skin:   normal  HEENT:  Normal  Neck:  Supple without Adenopathy or  Thyromegaly  Lungs:   Heart:              Breasts:   Abdomen:  Pelvis:  M/S   Extremeties:  Neuro:    clear to auscultation bilaterally   Normal without murmur   Not Examined   soft, non-tender; bowel sounds normal; no masses,  no organomegaly   Exam deferred to OR  No CVAT  Warm/Dry   Normal          Pathology:  Diagnosis:  ENDOMETRIUM, BIOPSY:  ATROPHIC ENDOMETRIUM. NO HYPERPLASIA OR CARCINOMA.   Imaging (Pelvic Ultrasound 06/23/2015):  Indications:PMB and Pelvic Pain  Findings:  The uterus measures 7.2 x 3.4 x 4.1 cm.  Echo texture is homogenous without evidence of focal masses.  The Endometrium is thickened measuring 8.603mm.  Multiple Nabothian Cysts are seen within the cervix with the largest measuring .93 x 1.3 x 1.4cm  The Endometrium is thickened measuring 8.643mm.  Left Ovary measures 2 x 1.2 x 1.2 cm. It is normal appearance.  Survey of the adnexa demonstrates no adnexal masses.  There is no free fluid in the cul de sac.  Impression:  1. The Endometrium is thickened measuring 8.873mm.  2. Multiple Nabothian Cysts are seen within the cervix with the largest measuring .93 x 1.3 x 1.4cm  3. The right ovary was not visualized   Assessment:    Postmenopausal bleeding Thickened endometrium.   Plan:   Counseling: Procedure, risks, reasons, benefits and complications (including injury to bowel, bladder, major blood vessel, ureter, bleeding, possibility of transfusion, infection, or fistula formation) reviewed in detail. Consent signed. Preop testing ordered. Instructions reviewed, including NPO after midnight.  Procedure of Hysteroscopy D&C scheduled for 07/04/2015.    Hildred LaserAnika Keajah Killough, MD [facility]

## 2015-06-30 ENCOUNTER — Encounter: Payer: Self-pay | Admitting: Obstetrics and Gynecology

## 2015-06-30 ENCOUNTER — Ambulatory Visit (INDEPENDENT_AMBULATORY_CARE_PROVIDER_SITE_OTHER): Payer: 59 | Admitting: Obstetrics and Gynecology

## 2015-06-30 VITALS — BP 132/82 | HR 82 | Ht 64.0 in | Wt 192.6 lb

## 2015-06-30 DIAGNOSIS — R9389 Abnormal findings on diagnostic imaging of other specified body structures: Secondary | ICD-10-CM

## 2015-06-30 DIAGNOSIS — Z720 Tobacco use: Secondary | ICD-10-CM

## 2015-06-30 DIAGNOSIS — N950 Postmenopausal bleeding: Secondary | ICD-10-CM | POA: Insufficient documentation

## 2015-06-30 DIAGNOSIS — R938 Abnormal findings on diagnostic imaging of other specified body structures: Secondary | ICD-10-CM

## 2015-06-30 DIAGNOSIS — E669 Obesity, unspecified: Secondary | ICD-10-CM

## 2015-06-30 NOTE — Progress Notes (Signed)
    GYNECOLOGY PROGRESS NOTE  Subjective:    Patient ID: Monica Hull, female    DOB: 1960-08-24, 55 y.o.   MRN: [id number]  HPI  Patient is a 55 y.o. 462P2002 female who presents for pre-operative exam for Hysteroscopy D&C for PMB.  Denies complaints today. Denies any further episodes of bleeding.   The following portions of the patient's history were reviewed and updated as appropriate: allergies, current medications, past family history, past medical history, past social history, past surgical history and problem list.  Review of Systems A comprehensive review of systems was negative.   Objective:   Blood pressure 132/82, pulse 82, height 5\' 4"  (1.626 m), weight 192 lb 9.6 oz (87.363 kg). Body mass index is 33.04 kg/(m^2).  General appearance: alert and no distress Abdomen: soft, non-tender; bowel sounds normal; no masses,  no organomegaly Pelvic: deferred to OR Extremities: extremities normal, atraumatic, no cyanosis or edema Neurologic: Grossly normal   Assessment:   Postmenopausal bleeding Thickened endometrium. Tobacco abuse  Plan:   Counseling: Procedure, risks, reasons, benefits and complications (including injury to bowel, bladder, major blood vessel, ureter, bleeding, possibility of transfusion, infection, or fistula formation) reviewed in detail. Consent signed. Preop testing ordered. Instructions reviewed, including NPO after midnight. Procedure of Hysteroscopy D&C scheduled for 07/11/2015 (previously scheduled for 07/04/15).  Continue to encourage smoking cessation.  Currently using nicotine patches.

## 2015-07-01 ENCOUNTER — Encounter: Payer: 59 | Admitting: Obstetrics and Gynecology

## 2015-07-04 ENCOUNTER — Encounter: Payer: Self-pay | Admitting: *Deleted

## 2015-07-04 ENCOUNTER — Other Ambulatory Visit: Payer: Self-pay

## 2015-07-04 NOTE — Patient Instructions (Addendum)
  Your procedure is scheduled on: 07-11-15 [facility](MONDAY) Report to [facility] SAME DAY SURGERY 2ND FLOOR To find out your arrival time please call [phone] between 1PM - 3PM on 07-08-15 (FRIDAY)  Remember: Instructions that are not followed completely may result in serious medical risk, up to and including death, or upon the discretion of your surgeon and anesthesiologist your surgery may need to be rescheduled.    _X___ 1. Do not eat food or drink liquids after midnight. No gum chewing or hard candies.     _X___ 2. No Alcohol for 24 hours before or after surgery.   ____ 3. Bring all medications with you on the day of surgery if instructed.    _X___ 4. Notify your doctor if there is any change in your medical condition     (cold, fever, infections).     Do not wear jewelry, make-up, hairpins, clips or nail polish.  Do not wear lotions, powders, or perfumes. You may wear deodorant.  Do not shave 48 hours prior to surgery. Men may shave face and neck.  Do not bring valuables to the hospital.    [facility] any belongings or valuables.               Contacts, dentures or bridgework may not be worn into surgery.  Leave your suitcase in the car. After surgery it may be brought to your room.  For patients admitted to the hospital, discharge time is determined by your treatment team.   Patients discharged the day of surgery will not be allowed to drive home.   Please read over the following fact sheets that you were given:     _X___ Take these medicines the morning of surgery with A SIP OF WATER:    1. TOVIAZ  2.  PROTONIX (PANTOPRAZOLE)  3.  TAKE A PROTONIX ON Sunday NIGHT  4.  5.  6.  ____ Fleet Enema (as directed)   ____ Use CHG Soap as directed  _X___ Use inhalers on the day of surgery-USE NEBULIZER AT HOME BEFORE SURGERY   ____ Stop metformin 2 days prior to surgery    ____ Take 1/2 of usual insulin dose the night before surgery and none on the  morning of surgery.   ____ Stop Coumadin/Plavix/aspirin-N/A  _X___ Stop Anti-inflammatories-STOP DICLOFENAC NOW-NO NSAIDS OR ASPIRIN PRODUCTS-TYLENOL OK TO TAKE   ____ Stop supplements until after surgery.    ____ Bring C-Pap to the hospital.

## 2015-07-06 ENCOUNTER — Encounter
Admission: RE | Admit: 2015-07-06 | Discharge: 2015-07-06 | Disposition: A | Discharge status: Home / Self Care | Payer: 59 | Source: Ambulatory Visit | Attending: Obstetrics and Gynecology | Admitting: Obstetrics and Gynecology

## 2015-07-06 DIAGNOSIS — Z01812 Encounter for preprocedural laboratory examination: Secondary | ICD-10-CM | POA: Insufficient documentation

## 2015-07-06 LAB — COMPREHENSIVE METABOLIC PANEL
ALK PHOS: 99 U/L (ref 38–126)
ALT: 50 U/L (ref 14–54)
AST: 42 U/L — ABNORMAL HIGH (ref 15–41)
Albumin: 3.9 g/dL (ref 3.5–5.0)
Anion gap: 9 (ref 5–15)
BILIRUBIN TOTAL: 1 mg/dL (ref 0.3–1.2)
BUN: 9 mg/dL (ref 6–20)
CALCIUM: 9.4 mg/dL (ref 8.9–10.3)
CO2: 26 mmol/L (ref 22–32)
CREATININE: 0.64 mg/dL (ref 0.44–1.00)
Chloride: 106 mmol/L (ref 101–111)
Glucose, Bld: 163 mg/dL — ABNORMAL HIGH (ref 65–99)
Potassium: 3.6 mmol/L (ref 3.5–5.1)
Sodium: 141 mmol/L (ref 135–145)
TOTAL PROTEIN: 7.3 g/dL (ref 6.5–8.1)

## 2015-07-06 LAB — CBC
HEMATOCRIT: 47 % (ref 35.0–47.0)
Hemoglobin: 15.9 g/dL (ref 12.0–16.0)
MCH: 30.1 pg (ref 26.0–34.0)
MCHC: 33.8 g/dL (ref 32.0–36.0)
MCV: 89.1 fL (ref 80.0–100.0)
PLATELETS: 238 10*3/uL (ref 150–440)
RBC: 5.27 MIL/uL — ABNORMAL HIGH (ref 3.80–5.20)
RDW: 13.6 % (ref 11.5–14.5)
WBC: 9.1 10*3/uL (ref 3.6–11.0)

## 2015-07-08 ENCOUNTER — Telehealth: Payer: Self-pay

## 2015-07-08 NOTE — Pre-Procedure Instructions (Signed)
Called Dr Chalmers Guestherrys office regarding Pepcid IV order that she puts in Epic-Robin went and talked to dr cherry and she said to discontinue Pepcid IV

## 2015-07-08 NOTE — Telephone Encounter (Signed)
Rep from pt's insurance company calls to inform us that the pt's prior auth for surgery has been approved. Berkley HarveyAuth #[id number]. Surgery coordinator informed.

## 2015-07-11 ENCOUNTER — Ambulatory Visit
Admission: RE | Admit: 2015-07-11 | Discharge: 2015-07-11 | Disposition: A | Discharge status: Home / Self Care | Payer: 59 | Source: Ambulatory Visit | Attending: Obstetrics and Gynecology | Admitting: Obstetrics and Gynecology

## 2015-07-11 ENCOUNTER — Ambulatory Visit: Payer: 59 | Admitting: Anesthesiology

## 2015-07-11 ENCOUNTER — Encounter
Admission: RE | Disposition: A | Discharge status: Home / Self Care | Payer: Self-pay | Source: Ambulatory Visit | Attending: Obstetrics and Gynecology

## 2015-07-11 DIAGNOSIS — I10 Essential (primary) hypertension: Secondary | ICD-10-CM | POA: Insufficient documentation

## 2015-07-11 DIAGNOSIS — Z881 Allergy status to other antibiotic agents status: Secondary | ICD-10-CM | POA: Diagnosis not present

## 2015-07-11 DIAGNOSIS — E785 Hyperlipidemia, unspecified: Secondary | ICD-10-CM | POA: Diagnosis not present

## 2015-07-11 DIAGNOSIS — Z791 Long term (current) use of non-steroidal anti-inflammatories (NSAID): Secondary | ICD-10-CM | POA: Diagnosis not present

## 2015-07-11 DIAGNOSIS — N858 Other specified noninflammatory disorders of uterus: Secondary | ICD-10-CM | POA: Diagnosis not present

## 2015-07-11 DIAGNOSIS — F17210 Nicotine dependence, cigarettes, uncomplicated: Secondary | ICD-10-CM | POA: Diagnosis not present

## 2015-07-11 DIAGNOSIS — Z8261 Family history of arthritis: Secondary | ICD-10-CM | POA: Diagnosis not present

## 2015-07-11 DIAGNOSIS — Z79899 Other long term (current) drug therapy: Secondary | ICD-10-CM | POA: Diagnosis not present

## 2015-07-11 DIAGNOSIS — G4730 Sleep apnea, unspecified: Secondary | ICD-10-CM | POA: Diagnosis not present

## 2015-07-11 DIAGNOSIS — Z825 Family history of asthma and other chronic lower respiratory diseases: Secondary | ICD-10-CM | POA: Insufficient documentation

## 2015-07-11 DIAGNOSIS — J449 Chronic obstructive pulmonary disease, unspecified: Secondary | ICD-10-CM | POA: Diagnosis not present

## 2015-07-11 DIAGNOSIS — N950 Postmenopausal bleeding: Secondary | ICD-10-CM | POA: Diagnosis present

## 2015-07-11 DIAGNOSIS — R938 Abnormal findings on diagnostic imaging of other specified body structures: Secondary | ICD-10-CM

## 2015-07-11 DIAGNOSIS — Z811 Family history of alcohol abuse and dependence: Secondary | ICD-10-CM | POA: Diagnosis not present

## 2015-07-11 DIAGNOSIS — E119 Type 2 diabetes mellitus without complications: Secondary | ICD-10-CM | POA: Insufficient documentation

## 2015-07-11 DIAGNOSIS — Z8349 Family history of other endocrine, nutritional and metabolic diseases: Secondary | ICD-10-CM | POA: Diagnosis not present

## 2015-07-11 DIAGNOSIS — J45909 Unspecified asthma, uncomplicated: Secondary | ICD-10-CM | POA: Diagnosis not present

## 2015-07-11 DIAGNOSIS — Z9889 Other specified postprocedural states: Secondary | ICD-10-CM | POA: Insufficient documentation

## 2015-07-11 DIAGNOSIS — Z833 Family history of diabetes mellitus: Secondary | ICD-10-CM | POA: Diagnosis not present

## 2015-07-11 DIAGNOSIS — Z8249 Family history of ischemic heart disease and other diseases of the circulatory system: Secondary | ICD-10-CM | POA: Diagnosis not present

## 2015-07-11 DIAGNOSIS — Z841 Family history of disorders of kidney and ureter: Secondary | ICD-10-CM | POA: Diagnosis not present

## 2015-07-11 DIAGNOSIS — K219 Gastro-esophageal reflux disease without esophagitis: Secondary | ICD-10-CM | POA: Diagnosis not present

## 2015-07-11 HISTORY — DX: Headache: R51

## 2015-07-11 HISTORY — DX: Chronic kidney disease, unspecified: N18.9

## 2015-07-11 HISTORY — DX: Headache, unspecified: R51.9

## 2015-07-11 HISTORY — PX: HYSTEROSCOPY W/D&C: SHX1775

## 2015-07-11 LAB — GLUCOSE, CAPILLARY: GLUCOSE-CAPILLARY: 146 mg/dL — AB (ref 65–99)

## 2015-07-11 SURGERY — DILATATION AND CURETTAGE /HYSTEROSCOPY
Anesthesia: General

## 2015-07-11 MED ORDER — SODIUM CHLORIDE 0.9 % IV SOLN
INTRAVENOUS | Status: DC
Start: 2015-07-11 — End: 2015-07-11
  Administered 2015-07-11: 09:00:00 via INTRAVENOUS

## 2015-07-11 MED ORDER — OXYCODONE HCL 5 MG/5ML PO SOLN: 5.0000 mg | Freq: Once | ORAL | Status: DC | PRN

## 2015-07-11 MED ORDER — MIDAZOLAM HCL 2 MG/2ML IJ SOLN
INTRAMUSCULAR | Status: DC | PRN
  Administered 2015-07-11: 2 mg via INTRAVENOUS

## 2015-07-11 MED ORDER — LIDOCAINE HCL (CARDIAC) 20 MG/ML IV SOLN
INTRAVENOUS | Status: DC | PRN
  Administered 2015-07-11: 100 mg via INTRAVENOUS

## 2015-07-11 MED ORDER — FENTANYL CITRATE (PF) 100 MCG/2ML IJ SOLN
INTRAMUSCULAR | Status: AC
  Filled 2015-07-11: qty 2

## 2015-07-11 MED ORDER — LACTATED RINGERS IV SOLN: INTRAVENOUS | Status: DC

## 2015-07-11 MED ORDER — PROPOFOL 10 MG/ML IV BOLUS
INTRAVENOUS | Status: DC | PRN
  Administered 2015-07-11: 150 mg via INTRAVENOUS

## 2015-07-11 MED ORDER — SODIUM CHLORIDE 0.9 % IJ SOLN
INTRAMUSCULAR | Status: AC
  Filled 2015-07-11: qty 3

## 2015-07-11 MED ORDER — LACTATED RINGERS IV SOLN
INTRAVENOUS | Status: DC | PRN
  Administered 2015-07-11: 10:00:00 via INTRAVENOUS

## 2015-07-11 MED ORDER — IPRATROPIUM-ALBUTEROL 0.5-2.5 (3) MG/3ML IN SOLN: 3.0000 mL | Freq: Once | RESPIRATORY_TRACT | Status: DC

## 2015-07-11 MED ORDER — FENTANYL CITRATE (PF) 100 MCG/2ML IJ SOLN
INTRAMUSCULAR | Status: DC | PRN
  Administered 2015-07-11 (×2): 50 ug via INTRAVENOUS

## 2015-07-11 MED ORDER — OXYCODONE HCL 5 MG PO TABS: 5.0000 mg | ORAL_TABLET | Freq: Once | ORAL | Status: DC | PRN

## 2015-07-11 MED ORDER — FENTANYL CITRATE (PF) 100 MCG/2ML IJ SOLN
25.0000 ug | INTRAMUSCULAR | Status: DC | PRN
  Administered 2015-07-11 (×2): 25 ug via INTRAVENOUS

## 2015-07-11 MED ORDER — ONDANSETRON HCL 4 MG/2ML IJ SOLN
INTRAMUSCULAR | Status: DC | PRN
  Administered 2015-07-11: 4 mg via INTRAVENOUS

## 2015-07-11 MED ORDER — ACETAMINOPHEN-CODEINE #3 300-30 MG PO TABS: 1.0000 | ORAL_TABLET | Freq: Four times a day (QID) | ORAL | Status: DC | PRN

## 2015-07-11 SURGICAL SUPPLY — 14 items
CATH ROBINSON RED A/P 16FR (CATHETERS) ×3 IMPLANT
GLOVE BIO SURGEON STRL SZ 6 (GLOVE) ×12 IMPLANT
GLOVE BIOGEL PI IND STRL 6.5 (GLOVE) ×4 IMPLANT
GLOVE BIOGEL PI INDICATOR 6.5 (GLOVE) ×8
GOWN STRL REUS W/ TWL LRG LVL3 (GOWN DISPOSABLE) ×2 IMPLANT
GOWN STRL REUS W/TWL LRG LVL3 (GOWN DISPOSABLE) ×4
IV LACTATED RINGERS 1000ML (IV SOLUTION) ×3 IMPLANT
KIT RM TURNOVER CYSTO AR (KITS) ×3 IMPLANT
PACK DNC HYST (MISCELLANEOUS) ×3 IMPLANT
PAD OB MATERNITY 4.3X12.25 (PERSONAL CARE ITEMS) ×3 IMPLANT
PAD PREP 24X41 OB/GYN DISP (PERSONAL CARE ITEMS) ×3 IMPLANT
SOL PREP PVP 2OZ (MISCELLANEOUS) ×3
SOLUTION PREP PVP 2OZ (MISCELLANEOUS) ×1 IMPLANT
SURGILUBE 2OZ TUBE FLIPTOP (MISCELLANEOUS) ×3 IMPLANT

## 2015-07-11 NOTE — OR Nursing (Signed)
No urine preg ordered per Dr Valentino Saxonherry,  patient is post menopausal.

## 2015-07-11 NOTE — Op Note (Signed)
Hysteroscopy Procedure Note  Indications: Postmenopausal bleeding, thickened endometrium  Pre-operative Diagnosis: Same  Post-operative Diagnosis: Same  Surgeon: Surgeon(s) and Role:    * Hildred LaserAnika Kinze Labo, MD - Primary   Assistants: None  Procedure:     * DILATATION & CURETTAGE/HYSTEROSCOPY  Anesthesia: General endotracheal anesthesia  ASA Class: III  Procedure Details: Patient was seen in the preoperative holding area where the consent was reviewed. Patient was consented for the following procedure: Hysteroscopy Dilation and Curettage. Pt was taken to the operating room # 5.    The patient was then placed under general anesthesia without difficulty.  She was then prepped and draped in the normal sterile fashion, and placed in the dorsal lithotomy position.  A time out was performed.  An exam under anesthesia was performed with the findings noted below.  Straight catheterization was performed. A sterile speculum was inserted into vagina. A single-tooth tenaculum was used to grasp the anterior lip of the cervix. Cervical dilation was performed. A 5 mm hysteroscope was introduced into the uterus under direct visualization. The cavity was allowed to fill, and then the entire cavity was explored with the findings described above. The hysteroscope was removed, and a sharp curette was then passed into the uterus and endometrial sampling was collected for pathology.  The hysteroscope was removed from the patient's uterine cavity. The tenaculum was removed and excellent hemostasis was noted. The speculum was removed from the vagina.   All instrument and sponge counts were correct at the end of the procedure x 2.  The patient tolerated the procedure well.  She was awakened and taken to the PACU in stable condition.   Findings: Uterus sounded to 9.5 cm.  Mostly atrophic endometrium, with 1 small area of weakly proliferative endometrium located on right.  Tubal ostium visualized on left.  Unable to  visualize on the right. No intrauterine masses.   Estimated Blood Loss:  10 ml (minimal)      Drains: straight catheterization with no return of urine           Total IV Fluids:  700 ml  Specimens:  Endometrial curettings         Implants: None         Complications:  None; patient tolerated the procedure well.         Disposition: PACU - hemodynamically stable.         Condition: stable   Hildred LaserAnika Annalie Wenner, MD [facility]

## 2015-07-11 NOTE — Anesthesia Preprocedure Evaluation (Signed)
Anesthesia Evaluation  Patient identified by MRN, date of birth, ID band Patient awake    Reviewed: Allergy & Precautions, H&P , NPO status , Patient's Chart, lab work & pertinent test results  History of Anesthesia Complications Negative for: history of anesthetic complications  Airway Mallampati: III  TM Distance: >3 FB Neck ROM: limited    Dental no notable dental hx. (+) Poor Dentition, Chipped, Missing   Pulmonary neg shortness of breath, asthma , sleep apnea , COPD, Current Smoker,    Pulmonary exam normal breath sounds clear to auscultation       Cardiovascular Exercise Tolerance: Good hypertension, (-) angina+ DOE  (-) Past MI Normal cardiovascular exam Rhythm:regular Rate:Normal     Neuro/Psych  Headaches,  Neuromuscular disease negative psych ROS   GI/Hepatic negative GI ROS, Neg liver ROS, GERD  Controlled,  Endo/Other  diabetes, Type 2  Renal/GU Renal disease  negative genitourinary   Musculoskeletal negative musculoskeletal ROS (+) Arthritis ,   Abdominal   Peds negative pediatric ROS (+)  Hematology negative hematology ROS (+)   Anesthesia Other Findings Past Medical History:   Asthma                                                       GERD (gastroesophageal reflux disease)                       Hyperlipidemia                                               Hypertension                                                 Impaired fasting glucose                                     COPD (chronic obstructive pulmonary disease) (*              Tobacco abuse                                                Elevated liver function tests                                Carpal tunnel syndrome                                       Sleep apnea  Comment:Not using CPAP- referred to sleep center   Diabetes mellitus without complication (HCC)                Comment:DIET CONTROLLED   Chronic kidney disease                                         Comment:H/O KIDNEY STONES   Headache                                                       Comment:MIGRAINES  Past Surgical History:   CESAREAN SECTION                                                Comment:X2   CARPAL TUNNEL RELEASE                                         PALATE / UVULA BIOPSY / EXCISION                             BMI    Body Mass Index   32.94 kg/m 2      Reproductive/Obstetrics negative OB ROS                             Anesthesia Physical Anesthesia Plan  ASA: III  Anesthesia Plan: General LMA   Post-op Pain Management:    Induction:   Airway Management Planned:   Additional Equipment:   Intra-op Plan:   Post-operative Plan:   Informed Consent: I have reviewed the patients History and Physical, chart, labs and discussed the procedure including the risks, benefits and alternatives for the proposed anesthesia with the patient or authorized representative who has indicated his/her understanding and acceptance.   Dental Advisory Given  Plan Discussed with: Anesthesiologist, CRNA and Surgeon  Anesthesia Plan Comments:         Anesthesia Quick Evaluation

## 2015-07-11 NOTE — Discharge Instructions (Signed)
General Gynecological Post-Operative Instructions °You may expect to feel dizzy, weak, and drowsy for as long as 24 hours after receiving the medicine that made you sleep (anesthetic).  °Do not drive a car, ride a bicycle, participate in physical activities, or take public transportation until you are done taking narcotic pain medicines or as directed by your doctor.  °Do not drink alcohol or take tranquilizers.  °Do not take medicine that has not been prescribed by your doctor.  °Do not sign important papers or make important decisions while on narcotic pain medicines.  °Have a responsible person with you.  °CARE OF INCISION  °Keep incision clean and dry. °Take showers instead of baths until your doctor gives you permission to take baths.  °Avoid heavy lifting (more than 10 pounds/4.5 kilograms), pushing, or pulling.  °Avoid activities that may risk injury to your surgical site.  °No sexual intercourse or placement of anything in the vagina for 2 weeks or as instructed by your doctor. °Only take prescription or over-the-counter medicines  for pain, discomfort, or fever as directed by your doctor. Do not take aspirin. It can make you bleed. Take medicines (antibiotics) that kill germs if they are prescribed for you.  °Call the office or go to the Emergency Room if:  °You feel sick to your stomach (nauseous).  °You start to throw up (vomit).  °You have trouble eating or drinking.  °You have an oral temperature above 101.  °You have constipation that is not helped by adjusting diet or increasing fluid intake. Pain medicines are a common cause of constipation.  °You have any other concerns. °SEEK IMMEDIATE MEDICAL CARE IF:  °You have persistent dizziness.  °You have difficulty breathing or a congested sounding (croupy) cough.  °You have an oral temperature above 102.5, not controlled by medicine.  °There is increasing pain or tenderness near or in the surgical site.  ° ° °

## 2015-07-11 NOTE — H&P (Signed)
UPDATE TO PREVIOUS HISTORY AND PHYSICAL  The patient has been seen and examined.  H&P is up to date, no changes noted.  55 y.o. 14P2002 female scheduled for Hysteroscopy D&C for postmenopausal bleeding and thickened endometrium.  All questions answered.  Patient has previously been counseled on risks of procedure.  Patient can proceed to the OR for scheduled procedure.   Hildred LaserAnika Takiya Belmares, MD 07/11/2015 9:48 AM

## 2015-07-11 NOTE — Transfer of Care (Signed)
Immediate Anesthesia Transfer of Care Note  Patient: Monica BiblesCynthia K Hull  Procedure(s) Performed: Procedure(s): DILATATION AND CURETTAGE /HYSTEROSCOPY (N/A)  Patient Location: PACU  Anesthesia Type:General  Level of Consciousness: awake  Airway & Oxygen Therapy: Patient Spontanous Breathing  Post-op Assessment: Report given to RN  Post vital signs: stable  Last Vitals:  Filed Vitals:   07/11/15 0831  BP: 125/85  Pulse: 83  Temp: 36.6 C  Resp: 16    Complications: No apparent anesthesia complications

## 2015-07-11 NOTE — Anesthesia Postprocedure Evaluation (Signed)
Anesthesia Post Note  Patient: Monica BiblesCynthia K Hull  Procedure(s) Performed: Procedure(s) (LRB): DILATATION AND CURETTAGE /HYSTEROSCOPY (N/A)  Patient location during evaluation: PACU Anesthesia Type: General Level of consciousness: awake and alert Pain management: pain level controlled Vital Signs Assessment: post-procedure vital signs reviewed and stable Respiratory status: spontaneous breathing, nonlabored ventilation, respiratory function stable and patient connected to nasal cannula oxygen Cardiovascular status: blood pressure returned to baseline and stable Postop Assessment: no signs of nausea or vomiting Anesthetic complications: no    Last Vitals:  Filed Vitals:   07/11/15 1202 07/11/15 1242  BP: 142/93 113/76  Pulse: 72 70  Temp:    Resp: 16 16    Last Pain:  Filed Vitals:   07/11/15 1242  PainSc: 2                  Cleda MccreedyJoseph K Itzae Miralles

## 2015-07-12 LAB — SURGICAL PATHOLOGY

## 2015-07-19 ENCOUNTER — Ambulatory Visit (INDEPENDENT_AMBULATORY_CARE_PROVIDER_SITE_OTHER): Payer: 59 | Admitting: Obstetrics and Gynecology

## 2015-07-19 ENCOUNTER — Encounter: Payer: Self-pay | Admitting: Obstetrics and Gynecology

## 2015-07-19 VITALS — BP 118/78 | HR 80 | Ht 64.0 in | Wt 191.7 lb

## 2015-07-19 DIAGNOSIS — R9389 Abnormal findings on diagnostic imaging of other specified body structures: Secondary | ICD-10-CM

## 2015-07-19 DIAGNOSIS — N950 Postmenopausal bleeding: Secondary | ICD-10-CM

## 2015-07-19 DIAGNOSIS — Z720 Tobacco use: Secondary | ICD-10-CM

## 2015-07-19 DIAGNOSIS — R938 Abnormal findings on diagnostic imaging of other specified body structures: Secondary | ICD-10-CM

## 2015-07-19 NOTE — Progress Notes (Signed)
    GYNECOLOGY PROGRESS NOTE  Subjective:    Patient ID: Monica Hull, female    DOB: Nov 15, 1960, 55 y.o.   MRN: [id number]  HPI  Patient is a 55 y.o. G36P2002 female who presents for 1 week f/u s/p hysteroscopy D&C for thickened endometrium and PMB.  Denies complaints today.    The following portions of the patient's history were reviewed and updated as appropriate: allergies, current medications, past family history, past medical history, past social history, past surgical history and problem list.  Review of Systems Pertinent items noted in HPI and remainder of comprehensive ROS otherwise negative.   Objective:   Blood pressure 118/78, pulse 80, height  (1.626 m), weight 191 lb 11.2 oz (86.955 kg). General appearance: alert and no distress Exam deferred.    Pathology (07/11/2015):  A. ENDOMETRIUM; CURETTAGE:  - FRAGMENTS OF PROLIFERATIVE TYPE GLANDS WITH SCANT STROMA, BLOOD AND BENIGN CERVICAL TISSUE.  - NEGATIVE FOR DYSPLASIA, HYPERPLASIA AND CARCINOMA.   Assessment:   Thickened Endometrium PMB Tobacco abuse  Plan:   Benign findings on tissue sampled.  No further management required at this time.  Informed patient to notify MD if bleeding symptoms continue.  Patient notes that she is now trying to convert to e-cigarettes and continue with nicotine patch.  Advised to f/u in 2 months.  Will also f/u on urinary incontinence at that visit.    Hildred Laser, MD [facility]

## 2015-08-04 ENCOUNTER — Encounter: Payer: Self-pay | Admitting: Family Medicine

## 2015-08-04 ENCOUNTER — Ambulatory Visit (INDEPENDENT_AMBULATORY_CARE_PROVIDER_SITE_OTHER): Payer: 59 | Admitting: Family Medicine

## 2015-08-04 VITALS — BP 142/95 | HR 68 | Temp 97.9°F | Ht 62.3 in | Wt 193.0 lb

## 2015-08-04 DIAGNOSIS — J441 Chronic obstructive pulmonary disease with (acute) exacerbation: Secondary | ICD-10-CM

## 2015-08-04 MED ORDER — DOXYCYCLINE HYCLATE 100 MG PO TABS: 100.0000 mg | ORAL_TABLET | Freq: Two times a day (BID) | ORAL | Status: DC

## 2015-08-04 MED ORDER — ALBUTEROL SULFATE (2.5 MG/3ML) 0.083% IN NEBU: 2.5000 mg | INHALATION_SOLUTION | Freq: Once | RESPIRATORY_TRACT | Status: DC

## 2015-08-04 MED ORDER — UMECLIDINIUM-VILANTEROL 62.5-25 MCG/INH IN AEPB: 1.0000 | INHALATION_SPRAY | Freq: Every day | RESPIRATORY_TRACT | Status: DC

## 2015-08-04 MED ORDER — BENZONATATE 200 MG PO CAPS: 200.0000 mg | ORAL_CAPSULE | Freq: Three times a day (TID) | ORAL | Status: DC | PRN

## 2015-08-04 MED ORDER — PREDNISONE 10 MG PO TABS: ORAL_TABLET | ORAL | Status: DC

## 2015-08-04 NOTE — Progress Notes (Signed)
BP 142/95 mmHg  Pulse 68  Temp(Src) 97.9 F (36.6 C)  Ht 5' 2.3" (1.582 m)  Wt 193 lb (87.544 kg)  BMI 34.98 kg/m2  SpO2 97%   Subjective:    Patient ID: Ok Monica Hull, female    DOB: 1960/07/06, 55 y.o.   MRN: [id number]  HPI: Monica Hull is a 55 y.o. female  Chief Complaint  Patient presents with  . URI    X 2 days   UPPER RESPIRATORY TRACT INFECTION Duration: 2 days Worst symptom: congestion Fever: yes low grade Cough: yes Shortness of breath: yes Wheezing: yes Chest pain: yes, with cough Chest tightness: yes Chest congestion: yes Nasal congestion: yes Runny nose: no Post nasal drip: yes Sneezing: yes Sore throat: no Swollen glands: no Sinus pressure: yes Headache: yes Face pain: no Toothache: yes Ear pain: yes "right Ear pressure: yes "right Eyes red/itching:no Eye drainage/crusting: no  Vomiting: no Rash: no Fatigue: yes Sick contacts: yes Strep contacts: no  Context: worse Recurrent sinusitis: no Relief with OTC cold/cough medications: no  Treatments attempted: cold/sinus, mucinex, anti-histamine and pseudoephedrine   Relevant past medical, surgical, family and social history reviewed and updated as indicated. Interim medical history since our last visit reviewed. Allergies and medications reviewed and updated.  Review of Systems  Constitutional: Negative.   HENT: Positive for congestion, postnasal drip, rhinorrhea, sinus pressure and sneezing. Negative for dental problem, drooling, ear discharge, ear pain, facial swelling, hearing loss, mouth sores, nosebleeds, sore throat, tinnitus, trouble swallowing and voice change.   Respiratory: Positive for cough, chest tightness, shortness of breath and wheezing. Negative for apnea, choking and stridor.   Cardiovascular: Negative.   Psychiatric/Behavioral: Negative.     Per HPI unless specifically indicated above     Objective:    BP 142/95 mmHg  Pulse 68  Temp(Src) 97.9 F (36.6 C)   Ht 5' 2.3" (1.582 m)  Wt 193 lb (87.544 kg)  BMI 34.98 kg/m2  SpO2 97%  Wt Readings from Last 3 Encounters:  08/04/15 193 lb (87.544 kg)  07/19/15 191 lb 11.2 oz (86.955 kg)  07/11/15 192 lb (87.091 kg)    Physical Exam  Constitutional: She is oriented to person, place, and time. She appears well-developed and well-nourished. No distress.  HENT:  Head: Normocephalic and atraumatic.  Right Ear: Hearing and external ear normal.  Left Ear: Hearing and external ear normal.  Nose: Nose normal.  Mouth/Throat: Oropharynx is clear and moist. No oropharyngeal exudate.  Eyes: Conjunctivae, EOM and lids are normal. Pupils are equal, round, and reactive to light. Right eye exhibits no discharge. Left eye exhibits no discharge. No scleral icterus.  Neck: Normal range of motion. Neck supple. No JVD present. No tracheal deviation present. No thyromegaly present.  Cardiovascular: Normal rate, regular rhythm, normal heart sounds and intact distal pulses.  Exam reveals no gallop and no friction rub.   No murmur heard. Pulmonary/Chest: Effort normal. No stridor. No respiratory distress. She has wheezes. She has no rales. She exhibits no tenderness.  Musculoskeletal: Normal range of motion.  Lymphadenopathy:    She has no cervical adenopathy.  Neurological: She is alert and oriented to person, place, and time.  Skin: Skin is warm, dry and intact. No rash noted. She is not diaphoretic. No erythema. No pallor.  Psychiatric: She has a normal mood and affect. Her speech is normal and behavior is normal. Judgment and thought content normal. Cognition and memory are normal.  Nursing note and vitals reviewed.  Assessment & Plan:   Problem List Items Addressed This Visit    None    Visit Diagnoses    COPD exacerbation (HCC)    -  Primary    Will treat with prednisone and doxy and tessalon. To start anoro to help with exacerbations. Recheck lungs 2 weeks. Continue to monitor.     Relevant  Medications    albuterol (PROVENTIL) (2.5 MG/3ML) 0.083% nebulizer solution 2.5 mg    predniSONE (DELTASONE) 10 MG tablet    benzonatate (TESSALON) 200 MG capsule    Umeclidinium-Vilanterol (ANORO ELLIPTA) 62.5-25 MCG/INH AEPB        Follow up plan: Return in about 2 weeks (around 08/18/2015) for Lung recheck with spiro.

## 2015-08-18 ENCOUNTER — Ambulatory Visit (INDEPENDENT_AMBULATORY_CARE_PROVIDER_SITE_OTHER): Payer: 59 | Admitting: Family Medicine

## 2015-08-18 ENCOUNTER — Encounter: Payer: Self-pay | Admitting: Family Medicine

## 2015-08-18 VITALS — BP 146/95 | HR 71 | Temp 97.8°F | Ht 61.5 in | Wt 192.0 lb

## 2015-08-18 DIAGNOSIS — E1169 Type 2 diabetes mellitus with other specified complication: Secondary | ICD-10-CM | POA: Insufficient documentation

## 2015-08-18 DIAGNOSIS — H6981 Other specified disorders of Eustachian tube, right ear: Secondary | ICD-10-CM

## 2015-08-18 DIAGNOSIS — I10 Essential (primary) hypertension: Secondary | ICD-10-CM | POA: Diagnosis not present

## 2015-08-18 DIAGNOSIS — R7301 Impaired fasting glucose: Secondary | ICD-10-CM | POA: Diagnosis not present

## 2015-08-18 DIAGNOSIS — E119 Type 2 diabetes mellitus without complications: Secondary | ICD-10-CM

## 2015-08-18 DIAGNOSIS — J438 Other emphysema: Secondary | ICD-10-CM

## 2015-08-18 DIAGNOSIS — R202 Paresthesia of skin: Secondary | ICD-10-CM | POA: Diagnosis not present

## 2015-08-18 LAB — BAYER DCA HB A1C WAIVED: HB A1C: 6.6 % (ref <7.0)

## 2015-08-18 NOTE — Assessment & Plan Note (Signed)
Lungs clear again. Couldn't tolerate anoro. Will start symbicort. Use listerine afterwards to avoid thrush. Continue to monitor.

## 2015-08-18 NOTE — Assessment & Plan Note (Signed)
Elevated today. Continue to monitor, may have to increase dose if still elevated next visit.

## 2015-08-18 NOTE — Assessment & Plan Note (Signed)
A1c back into diabetes range. Will get her into [facility] center and will recheck in 3 months.

## 2015-08-18 NOTE — Assessment & Plan Note (Signed)
A1c back up into diabetes range. See below.

## 2015-08-18 NOTE — Progress Notes (Signed)
BP 146/95 mmHg  Pulse 71  Temp(Src) 97.8 F (36.6 C)  Ht 5' 1.5" (1.562 m)  Wt 192 lb (87.091 kg)  BMI 35.70 kg/m2  SpO2 98%   Subjective:    Patient ID: Monica Hull, female    DOB: Oct 21, 1960, 55 y.o.   MRN: [id number]  HPI: Monica Hull is a 55 y.o. female  Chief Complaint  Patient presents with  . Diabetes  . lung recheck   Breathing feels better. Still short of breath. Finished all her medicine. Anoro gave her some thrush.  EAG CLOGGED Duration: days Involved ear(s):  "right Sensation of feeling clogged/plugged: yes Decreased/muffled hearing:yes Ear pain: no Fever: no Otorrhea: no Hearing loss: no Upper respiratory infection symptoms: yes Using Q-Tips: no Status: stable History of cerumenosis: no Treatments attempted: none  DIABETES Hypoglycemic episodes:no Polydipsia/polyuria: yes Visual disturbance: no Chest pain: no Paresthesias: yes Glucose Monitoring: no Taking Insulin?: no Retinal Examination: Up to Date, in October Foot Exam: Up to Date Diabetic Education: Not Completed Pneumovax: Up to Date Influenza: Up to Date Aspirin: no  Relevant past medical, surgical, family and social history reviewed and updated as indicated. Interim medical history since our last visit reviewed. Allergies and medications reviewed and updated.  Review of Systems  Constitutional: Negative.   HENT: Positive for congestion, postnasal drip and rhinorrhea. Negative for dental problem, drooling, ear discharge, ear pain, facial swelling, hearing loss, mouth sores, nosebleeds, sinus pressure, sneezing, sore throat, tinnitus, trouble swallowing and voice change.   Respiratory: Positive for cough and wheezing. Negative for apnea, choking, chest tightness, shortness of breath and stridor.   Cardiovascular: Negative.   Psychiatric/Behavioral: Negative.    Per HPI unless specifically indicated above     Objective:    BP 146/95 mmHg  Pulse 71  Temp(Src) 97.8 F  (36.6 C)  Ht 5' 1.5" (1.562 m)  Wt 192 lb (87.091 kg)  BMI 35.70 kg/m2  SpO2 98%  Wt Readings from Last 3 Encounters:  08/18/15 192 lb (87.091 kg)  08/04/15 193 lb (87.544 kg)  07/19/15 191 lb 11.2 oz (86.955 kg)    Physical Exam  Constitutional: She is oriented to person, place, and time. She appears well-developed and well-nourished. No distress.  HENT:  Head: Normocephalic and atraumatic.  Right Ear: Hearing and external ear normal.  Left Ear: Hearing and external ear normal.  Nose: Nose normal.  Mouth/Throat: Oropharynx is clear and moist. No oropharyngeal exudate.  Eyes: Conjunctivae, EOM and lids are normal. Pupils are equal, round, and reactive to light. Right eye exhibits no discharge. Left eye exhibits no discharge. No scleral icterus.  Neck: Normal range of motion. Neck supple. No JVD present. No tracheal deviation present. No thyromegaly present.  Cardiovascular: Normal rate, regular rhythm, normal heart sounds and intact distal pulses.  Exam reveals no gallop and no friction rub.   No murmur heard. Pulmonary/Chest: Effort normal and breath sounds normal. No stridor. No respiratory distress. She has no wheezes. She has no rales. She exhibits no tenderness.  Musculoskeletal: Normal range of motion.  Lymphadenopathy:    She has cervical adenopathy.  Neurological: She is alert and oriented to person, place, and time.  Skin: Skin is warm, dry and intact. No rash noted. She is not diaphoretic. No erythema. No pallor.  Psychiatric: She has a normal mood and affect. Her speech is normal and behavior is normal. Judgment and thought content normal. Cognition and memory are normal.  Nursing note and vitals reviewed.  Assessment & Plan:   Problem List Items Addressed This Visit      Cardiovascular and Mediastinum   Hypertension    Elevated today. Continue to monitor, may have to increase dose if still elevated next visit.         Respiratory   COPD (chronic  obstructive pulmonary disease) (HCC)    Lungs clear again. Couldn't tolerate anoro. Will start symbicort. Use listerine afterwards to avoid thrush. Continue to monitor.         Endocrine   Impaired fasting glucose - Primary    A1c back up into diabetes range. See below.       Relevant Orders   Bayer DCA Hb A1c Waived (Completed)     Other   Paresthesias   Diet-controlled diabetes mellitus (HCC)    A1c back into diabetes range. Will get her into [facility] and will recheck in 3 months.       Relevant Orders   Ambulatory referral to diabetic education    Other Visit Diagnoses    ETD (eustachian tube dysfunction), right        Start on flonase. Continue to monitor. Call if not getting better or getting worse.         Follow up plan: Return in about 3 months (around 11/15/2015) for DM visit.

## 2015-08-19 ENCOUNTER — Encounter: Payer: Self-pay | Admitting: Family Medicine

## 2015-08-19 LAB — BASIC METABOLIC PANEL
BUN/Creatinine Ratio: 9 (ref 9–23)
BUN: 7 mg/dL (ref 6–24)
CALCIUM: 9.3 mg/dL (ref 8.7–10.2)
CHLORIDE: 100 mmol/L (ref 96–106)
CO2: 25 mmol/L (ref 18–29)
Creatinine, Ser: 0.74 mg/dL (ref 0.57–1.00)
GFR, EST AFRICAN AMERICAN: 105 mL/min/{1.73_m2} (ref >59)
GFR, EST NON AFRICAN AMERICAN: 91 mL/min/{1.73_m2} (ref >59)
Glucose: 179 mg/dL — ABNORMAL HIGH (ref 65–99)
POTASSIUM: 4.3 mmol/L (ref 3.5–5.2)
Sodium: 141 mmol/L (ref 134–144)

## 2015-09-05 ENCOUNTER — Other Ambulatory Visit: Payer: Self-pay | Admitting: Obstetrics and Gynecology

## 2015-09-05 ENCOUNTER — Other Ambulatory Visit: Payer: Self-pay | Admitting: Family Medicine

## 2015-09-20 ENCOUNTER — Ambulatory Visit: Payer: 59 | Admitting: Obstetrics and Gynecology

## 2015-10-05 ENCOUNTER — Ambulatory Visit: Payer: 59 | Admitting: Obstetrics and Gynecology

## 2015-10-31 ENCOUNTER — Ambulatory Visit (INDEPENDENT_AMBULATORY_CARE_PROVIDER_SITE_OTHER): Payer: 59 | Admitting: Family Medicine

## 2015-10-31 ENCOUNTER — Encounter: Payer: Self-pay | Admitting: Family Medicine

## 2015-10-31 ENCOUNTER — Emergency Department
Admission: EM | Admit: 2015-10-31 | Discharge: 2015-11-01 | Disposition: A | Discharge status: Home / Self Care | Payer: 59 | Attending: Emergency Medicine | Admitting: Emergency Medicine

## 2015-10-31 ENCOUNTER — Emergency Department: Payer: 59

## 2015-10-31 VITALS — BP 156/92 | HR 72 | Temp 97.9°F | Wt 196.0 lb

## 2015-10-31 DIAGNOSIS — X58XXXA Exposure to other specified factors, initial encounter: Secondary | ICD-10-CM | POA: Diagnosis not present

## 2015-10-31 DIAGNOSIS — Y999 Unspecified external cause status: Secondary | ICD-10-CM | POA: Insufficient documentation

## 2015-10-31 DIAGNOSIS — J441 Chronic obstructive pulmonary disease with (acute) exacerbation: Secondary | ICD-10-CM

## 2015-10-31 DIAGNOSIS — E669 Obesity, unspecified: Secondary | ICD-10-CM | POA: Insufficient documentation

## 2015-10-31 DIAGNOSIS — E785 Hyperlipidemia, unspecified: Secondary | ICD-10-CM | POA: Insufficient documentation

## 2015-10-31 DIAGNOSIS — Z79899 Other long term (current) drug therapy: Secondary | ICD-10-CM | POA: Insufficient documentation

## 2015-10-31 DIAGNOSIS — T18108A Unspecified foreign body in esophagus causing other injury, initial encounter: Secondary | ICD-10-CM | POA: Diagnosis present

## 2015-10-31 DIAGNOSIS — Y929 Unspecified place or not applicable: Secondary | ICD-10-CM | POA: Diagnosis not present

## 2015-10-31 DIAGNOSIS — K209 Esophagitis, unspecified without bleeding: Secondary | ICD-10-CM

## 2015-10-31 DIAGNOSIS — I10 Essential (primary) hypertension: Secondary | ICD-10-CM

## 2015-10-31 DIAGNOSIS — F17210 Nicotine dependence, cigarettes, uncomplicated: Secondary | ICD-10-CM | POA: Diagnosis not present

## 2015-10-31 DIAGNOSIS — J45909 Unspecified asthma, uncomplicated: Secondary | ICD-10-CM | POA: Diagnosis not present

## 2015-10-31 DIAGNOSIS — E1122 Type 2 diabetes mellitus with diabetic chronic kidney disease: Secondary | ICD-10-CM | POA: Diagnosis not present

## 2015-10-31 DIAGNOSIS — R0989 Other specified symptoms and signs involving the circulatory and respiratory systems: Secondary | ICD-10-CM

## 2015-10-31 DIAGNOSIS — E041 Nontoxic single thyroid nodule: Secondary | ICD-10-CM

## 2015-10-31 DIAGNOSIS — Y9389 Activity, other specified: Secondary | ICD-10-CM | POA: Insufficient documentation

## 2015-10-31 DIAGNOSIS — J449 Chronic obstructive pulmonary disease, unspecified: Secondary | ICD-10-CM | POA: Diagnosis not present

## 2015-10-31 DIAGNOSIS — I129 Hypertensive chronic kidney disease with stage 1 through stage 4 chronic kidney disease, or unspecified chronic kidney disease: Secondary | ICD-10-CM | POA: Insufficient documentation

## 2015-10-31 DIAGNOSIS — N189 Chronic kidney disease, unspecified: Secondary | ICD-10-CM | POA: Insufficient documentation

## 2015-10-31 MED ORDER — DOXYCYCLINE HYCLATE 100 MG PO TABS: 100.0000 mg | ORAL_TABLET | Freq: Two times a day (BID) | ORAL | Status: DC

## 2015-10-31 MED ORDER — SUCRALFATE 1 G PO TABS
1.0000 g | ORAL_TABLET | Freq: Once | ORAL | Status: AC
  Administered 2015-11-01: 1 g via ORAL
  Filled 2015-10-31: qty 1

## 2015-10-31 MED ORDER — GI COCKTAIL ~~LOC~~
30.0000 mL | Freq: Once | ORAL | Status: AC
  Administered 2015-11-01: 30 mL via ORAL
  Filled 2015-10-31: qty 30

## 2015-10-31 MED ORDER — BUDESONIDE-FORMOTEROL FUMARATE 160-4.5 MCG/ACT IN AERO: 2.0000 | INHALATION_SPRAY | Freq: Two times a day (BID) | RESPIRATORY_TRACT | Status: DC

## 2015-10-31 MED ORDER — IPRATROPIUM-ALBUTEROL 0.5-2.5 (3) MG/3ML IN SOLN
3.0000 mL | Freq: Once | RESPIRATORY_TRACT | Status: AC
  Administered 2015-11-01: 3 mL via RESPIRATORY_TRACT
  Filled 2015-10-31: qty 3

## 2015-10-31 MED ORDER — GLUCAGON HCL RDNA (DIAGNOSTIC) 1 MG IJ SOLR
1.0000 mg | Freq: Once | INTRAMUSCULAR | Status: DC
  Filled 2015-10-31: qty 1

## 2015-10-31 MED ORDER — LISINOPRIL 30 MG PO TABS: 30.0000 mg | ORAL_TABLET | Freq: Every day | ORAL | Status: DC

## 2015-10-31 MED ORDER — PREDNISONE 10 MG PO TABS: ORAL_TABLET | ORAL | Status: DC

## 2015-10-31 MED ORDER — ALBUTEROL SULFATE (2.5 MG/3ML) 0.083% IN NEBU: 2.5000 mg | INHALATION_SOLUTION | Freq: Once | RESPIRATORY_TRACT | Status: DC

## 2015-10-31 NOTE — ED Notes (Signed)
Patient was eating ribs for dinner and swallowed a splintered piece of bone. Feels like it is sticking her in the throat. Able to swallow secretions without difficulty, no difficulty breathing at this time.

## 2015-10-31 NOTE — Assessment & Plan Note (Signed)
Still elevated. Will increase lisinopril to 30mg  daily and recheck in 2 weeks at follow up. Call with problems.

## 2015-10-31 NOTE — Progress Notes (Signed)
BP 156/92 mmHg  Pulse 72  Temp(Src) 97.9 F (36.6 C)  Wt 196 lb (88.905 kg)  SpO2 98%   Subjective:    Patient ID: [name], female    DOB: Mar 16, 1961, 55 y.o.   MRN: [id number]  HPI: NARE GASPARI is a 55 y.o. female  Chief Complaint  Patient presents with  . Cough   UPPER RESPIRATORY TRACT INFECTION- stopped taking her anoro because it gave her thrush. Not on anything right now.  Duration: Over a week Worst symptom: coughing Fever: no Cough: yes Shortness of breath: yes Wheezing: yes Chest pain: yes, with cough Chest tightness: yes Chest congestion: yes Nasal congestion: no Runny nose: no Post nasal drip: no Sneezing: no Sore throat: no Swollen glands: no Sinus pressure: no Headache: no Face pain: no Toothache: no Ear pain: no  Ear pressure: no  Eyes red/itching:no Eye drainage/crusting: no  Vomiting: no Rash: no Fatigue: yes Sick contacts: no Strep contacts: no  Context: worse Recurrent sinusitis: no Relief with OTC cold/cough medications: no  Treatments attempted: nebulizer   Relevant past medical, surgical, family and social history reviewed and updated as indicated. Interim medical history since our last visit reviewed. Allergies and medications reviewed and updated.  Review of Systems  Constitutional: Negative.   Respiratory: Positive for cough, chest tightness, shortness of breath and wheezing. Negative for apnea, choking and stridor.   Cardiovascular: Positive for chest pain. Negative for palpitations and leg swelling.  Psychiatric/Behavioral: Negative.     Per HPI unless specifically indicated above     Objective:    BP 156/92 mmHg  Pulse 72  Temp(Src) 97.9 F (36.6 C)  Wt 196 lb (88.905 kg)  SpO2 98%  Wt Readings from Last 3 Encounters:  10/31/15 196 lb (88.905 kg)  08/18/15 192 lb (87.091 kg)  08/04/15 193 lb (87.544 kg)    Physical Exam  Constitutional: She is oriented to person, place, and time. She appears  well-developed and well-nourished. No distress.  HENT:  Head: Normocephalic and atraumatic.  Right Ear: Hearing normal.  Left Ear: Hearing normal.  Nose: Nose normal.  Eyes: Conjunctivae and lids are normal. Right eye exhibits no discharge. Left eye exhibits no discharge. No scleral icterus.  Cardiovascular: Normal rate, regular rhythm, normal heart sounds and intact distal pulses.  Exam reveals no gallop and no friction rub.   No murmur heard. Pulmonary/Chest: Effort normal. No respiratory distress. She has decreased breath sounds. She has wheezes in the right upper field, the right middle field, the right lower field, the left upper field, the left middle field and the left lower field. She has rhonchi in the left lower field. She has no rales. She exhibits no tenderness.  Musculoskeletal: Normal range of motion.  Neurological: She is alert and oriented to person, place, and time.  Skin: Skin is warm, dry and intact. No rash noted. No erythema. No pallor.  Psychiatric: She has a normal mood and affect. Her speech is normal and behavior is normal. Judgment and thought content normal. Cognition and memory are normal.  Nursing note and vitals reviewed.   Results for orders placed or performed in visit on 08/18/15  Basic metabolic panel  Result Value Ref Range   Glucose 179 (H) 65 - 99 mg/dL   BUN 7 6 - 24 mg/dL   Creatinine, Ser 4.09 0.57 - 1.00 mg/dL   GFR calc non Af Amer 91 >59 mL/min/1.73   GFR calc Af Amer 105 >59 mL/min/1.73   BUN/Creatinine  Ratio 9 9 - 23   Sodium 141 134 - 144 mmol/L   Potassium 4.3 3.5 - 5.2 mmol/L   Chloride 100 96 - 106 mmol/L   CO2 25 18 - 29 mmol/L   Calcium 9.3 8.7 - 10.2 mg/dL  Bayer DCA Hb [id number] Waived  Result Value Ref Range   Bayer DCA Hb A1c Waived 6.6 <7.0 %      Assessment & Plan:   Problem List Items Addressed This Visit      Cardiovascular and Mediastinum   Hypertension    Still elevated. Will increase lisinopril to 30mg  daily and recheck  in 2 weeks at follow up. Call with problems.       Relevant Medications   lisinopril (PRINIVIL,ZESTRIL) 30 MG tablet    Other Visit Diagnoses    COPD exacerbation (HCC)    -  Primary    Slightly better after neb. Will treat with doxycycline and prednisone. Will start symbicort. Call with any problems. Recheck 2 weeks.     Relevant Medications    albuterol (PROVENTIL) (2.5 MG/3ML) 0.083% nebulizer solution 2.5 mg    budesonide-formoterol (SYMBICORT) 160-4.5 MCG/ACT inhaler    predniSONE (DELTASONE) 10 MG tablet        Follow up plan: Return in about 2 weeks (around 11/14/2015) for Lung recheck and BP check.

## 2015-11-01 DIAGNOSIS — E041 Nontoxic single thyroid nodule: Secondary | ICD-10-CM

## 2015-11-01 NOTE — Discharge Instructions (Signed)
Esophagitis °Esophagitis is inflammation of the esophagus. The esophagus is the tube that carries food and liquids from your mouth to your stomach. Esophagitis can cause soreness or pain in the esophagus. This condition can make it difficult and painful to swallow.  °CAUSES °Most causes of esophagitis are not serious. Common causes of this condition include: °· Gastroesophageal reflux disease (GERD). This is when stomach contents move back up into the esophagus (reflux). °· Repeated vomiting. °· An allergic-type reaction, especially caused by food allergies (eosinophilic esophagitis). °· Injury to the esophagus by swallowing large pills with or without water, or swallowing certain types of medicines. °· Swallowing (ingesting) harmful chemicals, such as household cleaning products. °· Heavy alcohol use. °· An infection of the esophagus. This most often occurs in people who have a weakened immune system. °· Radiation or chemotherapy treatment for cancer. °· Certain diseases such as sarcoidosis, Crohn disease, and scleroderma. °SYMPTOMS °Symptoms of this condition include: °· Difficult or painful swallowing. °· Pain with swallowing acidic liquids, such as citrus juices. °· Pain with burping. °· Chest pain. °· Difficulty breathing. °· Nausea. °· Vomiting. °· Pain in the abdomen. °· Weight loss. °· Ulcers in the mouth. °· Patches of white material in the mouth (candidiasis). °· Fever. °· Coughing up blood or vomiting blood. °· Stool that is black, tarry, or bright red. °DIAGNOSIS °Your health care provider will take a medical history and perform a physical exam. You may also have other tests, including: °· An endoscopy to examine your stomach and esophagus with a small camera. °· A test that measures the acidity level in your esophagus. °· A test that measures how much pressure is on your esophagus. °· A barium swallow or modified barium swallow to show the shape, size, and functioning of your esophagus. °· Allergy  tests. °TREATMENT °Treatment for this condition depends on the cause of your esophagitis. In some cases, steroids or other medicines may be given to help relieve your symptoms or to treat the underlying cause of your condition. You may have to make some lifestyle changes, such as: °· Avoiding alcohol. °· Quitting smoking. °· Changing your diet. °· Exercising. °· Changing your sleep habits and your sleep environment. °HOME CARE INSTRUCTIONS °Take these actions to decrease your discomfort and to help avoid complications. °Diet °· Follow a diet as recommended by your health care provider. This may involve avoiding foods and drinks such as: °¨ Coffee and tea (with or without caffeine). °¨ Drinks that contain alcohol. °¨ Energy drinks and sports drinks. °¨ Carbonated drinks or sodas. °¨ Chocolate and cocoa. °¨ Peppermint and mint flavorings. °¨ Garlic and onions. °¨ Horseradish. °¨ Spicy and acidic foods, including peppers, chili powder, curry powder, vinegar, hot sauces, and barbecue sauce. °¨ Citrus fruit juices and citrus fruits, such as oranges, lemons, and limes. °¨ Tomato-based foods, such as red sauce, chili, salsa, and pizza with red sauce. °¨ Fried and fatty foods, such as donuts, french fries, potato chips, and high-fat dressings. °¨ High-fat meats, such as hot dogs and fatty cuts of red and white meats, such as rib eye steak, sausage, ham, and bacon. °¨ High-fat dairy items, such as whole milk, butter, and cream cheese. °· Eat small, frequent meals instead of large meals. °· Avoid drinking large amounts of liquid with your meals. °· Avoid eating meals during the 2-3 hours before bedtime. °· Avoid lying down right after you eat. °· Do not exercise right after you eat. °· Avoid foods and drinks that seem to   make your symptoms worse. °General Instructions °· Pay attention to any changes in your symptoms. °· Take over-the-counter and prescription medicines only as told by your health care provider. Do not take  aspirin, ibuprofen, or other NSAIDs unless your health care provider told you to do so. °· If you have trouble taking pills, use a pill splitter to decrease the size of the pill. This will decrease the chance of the pill getting stuck or injuring your esophagus on the way down. Also, drink water after you take a pill. °· Do not use any tobacco products, including cigarettes, chewing tobacco, and e-cigarettes. If you need help quitting, ask your health care provider. °· Wear loose-fitting clothing. Do not wear anything tight around your waist that causes pressure on your abdomen. °· Raise (elevate) the head of your bed about 6 inches (15 cm). °· Try to reduce your stress, such as with yoga or meditation. If you need help reducing stress, ask your health care provider. °· If you are overweight, reduce your weight to an amount that is healthy for you. Ask your health care provider for guidance about a safe weight loss goal. °· Keep all follow-up visits as told by your health care provider. This is important. °SEEK MEDICAL CARE IF: °· You have new symptoms. °· You have unexplained weight loss. °· You have difficulty swallowing, or it hurts to swallow. °· You have wheezing or a persistent cough. °· Your symptoms do not improve with treatment. °· You have frequent heartburn for more than two weeks. °SEEK IMMEDIATE MEDICAL CARE IF: °· You have severe pain in your arms, neck, jaw, teeth, or back. °· You feel sweaty, dizzy, or light-headed. °· You have chest pain or shortness of breath. °· You vomit and your vomit looks like blood or coffee grounds. °· Your stool is bloody or black. °· You have a fever. °· You cannot swallow, drink, or eat. °  °This information is not intended to replace advice given to you by your health care provider. Make sure you discuss any questions you have with your health care provider. °  °Document Released: 07/19/2004 Document Revised: 03/02/2015 Document Reviewed: 10/06/2014 °[employer] Interactive  Patient Education ©2016 [employer] ° °

## 2015-11-01 NOTE — ED Notes (Signed)
Pt. Going home with family. 

## 2015-11-01 NOTE — ED Provider Notes (Signed)
[facility] Emergency Department Provider Note   ____________________________________________  Time seen: Approximately 2351 AM  I have reviewed the triage vital signs and the nursing notes.   HISTORY  Chief Complaint Foreign Body    HPI Monica Hull is a 55 y.o. female who comes into the hospital today with a sensation of a foreign body in her throat. The patient reports she was eating pork ribs when a piece of bone went down her throat. She reports that she feels as though it is stuck on the left side of her throat. The patient reports that it keeps feeling scratchy and irritating. The patient had the sensation starting at about 7:30 PM. She started drinking [facility] to try to get it up but it would not come up. She reports that she has been drinking and tolerating his secretions but she is not eating anything. She reports that she still feels as though something is stuck in the left side of her throat. The patient came into the hospital to get checked out.   Past Medical History  Diagnosis Date  . Asthma   . GERD (gastroesophageal reflux disease)   . Hyperlipidemia   . Hypertension   . Impaired fasting glucose   . COPD (chronic obstructive pulmonary disease) (HCC)   . Tobacco abuse   . Elevated liver function tests   . Carpal tunnel syndrome   . Sleep apnea     Not using CPAP- referred to sleep center  . Diabetes mellitus without complication (HCC)     DIET CONTROLLED  . Chronic kidney disease     H/O KIDNEY STONES  . Headache     MIGRAINES    Patient Active Problem List   Diagnosis Date Noted  . Diet-controlled diabetes mellitus (HCC) 08/18/2015  . PMB (postmenopausal bleeding) 06/30/2015  . Obesity (BMI 30.0-34.9) 06/30/2015  . Arm pain, inferior 02/01/2015  . Paresthesias 02/01/2015  . DJD (degenerative joint disease), cervical 02/01/2015  . Stress incontinence 12/21/2014  . Chest pain 10/20/2014  . Asthma   . GERD  (gastroesophageal reflux disease)   . Sleep apnea   . Hyperlipidemia   . Hypertension   . Impaired fasting glucose   . COPD (chronic obstructive pulmonary disease) (HCC)   . Tobacco abuse     Past Surgical History  Procedure Laterality Date  . Cesarean section      X2  . Carpal tunnel release    . Palate / uvula biopsy / excision    . Hysteroscopy w/d&c N/A 07/11/2015    Procedure: DILATATION AND CURETTAGE /HYSTEROSCOPY;  Surgeon: Hildred LaserAnika Cherry, MD;  Location: [facility] ORS;  Service: Gynecology;  Laterality: N/A;    Current Outpatient Rx  Name  Route  Sig  Dispense  Refill  . albuterol (PROVENTIL) (2.5 MG/3ML) 0.083% nebulizer solution   Nebulization   Take 3 mLs (2.5 mg total) by nebulization every 6 (six) hours as needed for wheezing or shortness of breath. Patient taking differently: Take 2.5 mg by nebulization 2 (two) times daily.    150 mL   1   . budesonide-formoterol (SYMBICORT) 160-4.5 MCG/ACT inhaler   Inhalation   Inhale 2 puffs into the lungs 2 (two) times daily.   1 Inhaler   3   . diclofenac (VOLTAREN) 75 MG EC tablet   Oral   Take 1 tablet (75 mg total) by mouth 2 (two) times daily as needed.   60 tablet   3   . doxycycline (VIBRA-TABS)  100 MG tablet   Oral   Take 1 tablet (100 mg total) by mouth 2 (two) times daily.   20 tablet   0   . fesoterodine (TOVIAZ) 8 MG TB24 tablet   Oral   Take 1 tablet (8 mg total) by mouth daily. Patient taking differently: Take 8 mg by mouth 2 (two) times daily.    30 tablet   11   . lisinopril (PRINIVIL,ZESTRIL) 30 MG tablet   Oral   Take 1 tablet (30 mg total) by mouth daily.   30 tablet   1     No refills available   . pantoprazole (PROTONIX) 40 MG tablet   Oral   Take 1 tablet (40 mg total) by mouth daily. Patient taking differently: Take 40 mg by mouth every morning.    30 tablet   6   . predniSONE (DELTASONE) 10 MG tablet      6 tabs today, 5 tabs tomorrow, 4 tabs the next, decrease by 1 every day until  gone.   21 tablet   0   . tiZANidine (ZANAFLEX) 4 MG capsule   Oral   Take 4 mg by mouth 3 (three) times daily as needed for muscle spasms. Reported on 07/11/2015           Allergies Anoro ellipta and Biaxin  Family History  Problem Relation Age of Onset  . Arthritis Mother   . Asthma Mother   . Diabetes Mother   . Heart disease Mother   . Hyperlipidemia Mother   . Hypertension Mother   . Kidney disease Mother   . Lung disease Mother   . Alcohol abuse Father   . Hypertension Father   . Hyperlipidemia Father   . Diabetes Brother     Social History Social History  Substance Use Topics  . Smoking status: Current Every Day Smoker -- 1.50 packs/day for 39 years    Types: Cigarettes  . Smokeless tobacco: Current User     Comment: Currently on nicotine patches, down to 1 ppd since 06/23/2015  . Alcohol Use: No    Review of Systems Constitutional: No fever/chills Eyes: No visual changes. ENT: Foreign body sensation in throat. Cardiovascular: Denies chest pain. Respiratory: Denies shortness of breath. Gastrointestinal: No abdominal pain.  No nausea, no vomiting.  No diarrhea.  No constipation. Genitourinary: Negative for dysuria. Musculoskeletal: Negative for back pain. Skin: Negative for rash. Neurological: Negative for headaches, focal weakness or numbness.  10-point ROS otherwise negative.  ____________________________________________   PHYSICAL EXAM:  VITAL SIGNS: ED Triage Vitals  Enc Vitals Group     BP 10/31/15 2028 169/100 mmHg     Pulse Rate 10/31/15 2028 84     Resp 10/31/15 2028 20     Temp 10/31/15 2028 98.2 F (36.8 C)     Temp Source 10/31/15 2028 Oral     SpO2 10/31/15 2028 91 %     Weight 10/31/15 2028 196 lb (88.905 kg)     Height 10/31/15 2028  (1.575 m)     Head Cir --      Peak Flow --      Pain Score 10/31/15 2030 8     Pain Loc --      Pain Edu? --      Excl. in GC? --     Constitutional: Alert and oriented. Well  appearing and in no acute distress. Eyes: Conjunctivae are normal. PERRL. EOMI. Head: Atraumatic. Nose: No congestion/rhinnorhea. Mouth/Throat: Mucous membranes are  moist.  Oropharynx non-erythematous. Cardiovascular: Normal rate, regular rhythm. Grossly normal heart sounds.  Good peripheral circulation. Respiratory: Normal respiratory effort.  No retractions. Expiratory wheezes throughout all lung fields Gastrointestinal: Soft and nontender. No distention. Positive bowel sounds Musculoskeletal: No lower extremity tenderness nor edema.  . Neurologic:  Normal speech and language.  Skin:  Skin is warm, dry and intact.  Psychiatric: Mood and affect are normal.   ____________________________________________   LABS (all labs ordered are listed, but only abnormal results are displayed)  Labs Reviewed - No data to display ____________________________________________  EKG  none ____________________________________________  RADIOLOGY  CT soft tissue neck: No retained radiopaque foreign body identified, small volume of retained secretions in the pharynx might be secondary to bilateral maxillary sinus inflammatory changes with sinus fluid level suggesting acute sinusitis, right middle ear and mastoid opacification new since 2007 and suggestion of interval right mastoid coalescence suggests this is related to chronic or recurrent middle ear infection, right thyroid lobe nodule measuring 18 mm, poor dentition, calcified carotid artery atherosclerosis. ____________________________________________   PROCEDURES  Procedure(s) performed: None  Critical Care performed: No  ____________________________________________   INITIAL IMPRESSION / ASSESSMENT AND PLAN / ED COURSE  Pertinent labs & imaging results that were available during my care of the patient were reviewed by me and considered in my medical decision making (see chart for details).  This is a 55 year old female who comes into the  hospital with the sensation of a foreign body in her throat. The patient a CT scan that was performed that was unremarkable. Since the patient still having these sensations I will give her a GI cocktail as well as some glucagon. As the patient also has some wheezing I will give her a DuoNeb treatment.  The nurse did come to me and reports that the patient did take a GI cocktail but she is refusing the glucagon because she does not feel she needs it at this time.  The patient was sleeping on my reassessment. She'll be discharged home to follow-up with ENT. ____________________________________________   FINAL CLINICAL IMPRESSION(S) / ED DIAGNOSES  Final diagnoses:  Globus sensation  Esophagitis      NEW MEDICATIONS STARTED DURING THIS VISIT:  Discharge Medication List as of 11/01/2015  1:48 AM       Note:  This document was prepared using Dragon voice recognition software and may include unintentional dictation errors.    Rebecka Apley, MD 11/01/15 [phone]

## 2015-11-03 NOTE — Telephone Encounter (Signed)
done

## 2015-11-17 ENCOUNTER — Encounter: Payer: Self-pay | Admitting: Family Medicine

## 2015-11-17 ENCOUNTER — Ambulatory Visit (INDEPENDENT_AMBULATORY_CARE_PROVIDER_SITE_OTHER): Payer: 59 | Admitting: Family Medicine

## 2015-11-17 VITALS — BP 117/80 | HR 69 | Temp 97.9°F | Wt 195.0 lb

## 2015-11-17 DIAGNOSIS — E119 Type 2 diabetes mellitus without complications: Secondary | ICD-10-CM

## 2015-11-17 DIAGNOSIS — I10 Essential (primary) hypertension: Secondary | ICD-10-CM | POA: Diagnosis not present

## 2015-11-17 DIAGNOSIS — H9193 Unspecified hearing loss, bilateral: Secondary | ICD-10-CM

## 2015-11-17 DIAGNOSIS — E785 Hyperlipidemia, unspecified: Secondary | ICD-10-CM

## 2015-11-17 DIAGNOSIS — J438 Other emphysema: Secondary | ICD-10-CM

## 2015-11-17 DIAGNOSIS — J060 Acute laryngopharyngitis: Secondary | ICD-10-CM | POA: Diagnosis not present

## 2015-11-17 LAB — MICROALBUMIN, URINE WAIVED
CREATININE, URINE WAIVED: 300 mg/dL (ref 10–300)
Microalb, Ur Waived: 30 mg/L — ABNORMAL HIGH (ref 0–19)

## 2015-11-17 LAB — LIPID PANEL PICCOLO, WAIVED
Chol/HDL Ratio Piccolo,Waive: 4.9 mg/dL
Cholesterol Piccolo, Waived: 191 mg/dL (ref <200)
HDL CHOL PICCOLO, WAIVED: 39 mg/dL — AB (ref >59)
LDL CHOL CALC PICCOLO WAIVED: 117 mg/dL — AB (ref <100)
Triglycerides Piccolo,Waived: 177 mg/dL — ABNORMAL HIGH (ref <150)
VLDL CHOL CALC PICCOLO,WAIVE: 35 mg/dL — AB (ref <30)

## 2015-11-17 LAB — BAYER DCA HB A1C WAIVED: HB A1C (BAYER DCA - WAIVED): 6.7 % (ref <7.0)

## 2015-11-17 MED ORDER — BUDESONIDE-FORMOTEROL FUMARATE 160-4.5 MCG/ACT IN AERO: 2.0000 | INHALATION_SPRAY | Freq: Two times a day (BID) | RESPIRATORY_TRACT | Status: DC

## 2015-11-17 MED ORDER — PANTOPRAZOLE SODIUM 40 MG PO TBEC: 40.0000 mg | DELAYED_RELEASE_TABLET | ORAL | Status: DC

## 2015-11-17 MED ORDER — LISINOPRIL 30 MG PO TABS: 30.0000 mg | ORAL_TABLET | Freq: Every day | ORAL | Status: DC

## 2015-11-17 MED ORDER — ALBUTEROL SULFATE (2.5 MG/3ML) 0.083% IN NEBU: 2.5000 mg | INHALATION_SOLUTION | Freq: Four times a day (QID) | RESPIRATORY_TRACT | Status: DC | PRN

## 2015-11-17 NOTE — Assessment & Plan Note (Signed)
Under goof control. Continue current regimen. Call with any concerns.

## 2015-11-17 NOTE — Assessment & Plan Note (Signed)
Exacerbation resolved. Needs to use her steroid inhaler to prevent them. Will try rinsing with listerine after use. Recheck 3 months.

## 2015-11-17 NOTE — Progress Notes (Signed)
BP 117/80 mmHg  Pulse 69  Temp(Src) 97.9 F (36.6 C)  Wt 195 lb (88.451 kg)  SpO2 96%   Subjective:    Patient ID: Monica Hull, female    DOB: 07-Apr-1961, 55 y.o.   MRN: [id number]  HPI: Monica Hull is a 55 y.o. female  Chief Complaint  Patient presents with  . Diabetes  . COPD  . Hypertension   HYPERTENSION / HYPERLIPIDEMIA Satisfied with current treatment? yes Duration of hypertension: chronic BP monitoring frequency: not checking BP medication side effects: no Duration of hyperlipidemia: chronic Cholesterol medication side effects: no Cholesterol supplements: none Medication compliance: excellent compliance Aspirin: no Recent stressors: no Recurrent headaches: no Visual changes: no Palpitations: no Dyspnea: no Chest pain: no Lower extremity edema: no Dizzy/lightheaded: no  DIABETES Hypoglycemic episodes:no Polydipsia/polyuria: no Visual disturbance: no Chest pain: no Paresthesias: no Glucose Monitoring: no Taking Insulin?: no Blood Pressure Monitoring: not checking Retinal Examination: Up to Date Foot Exam: Up to Date Diabetic Education: Completed Pneumovax: Up to Date Influenza: Up to Date Aspirin: yes  Lungs feeling much better. No more wheezing. Still not really using her symbicort because it gives her thrush. Will try rinsing with listerine after use and we'll see how she's doing in 3 months. Swallowed a bug about 2 weeks ago. Went to the ER, and everything looked Monica. Since then her voice has been hoarse and she has had a really sore throat. She would like to see an ENT to see if everything is Monica. She also notes that her hearing has gotten worse, and she would like to see someone about that. No other concerns or complaints at this time.    Relevant past medical, surgical, family and social history reviewed and updated as indicated. Interim medical history since our last visit reviewed. Allergies and medications reviewed and  updated.  Review of Systems  Constitutional: Negative.   HENT: Positive for hearing loss, sore throat and voice change. Negative for congestion, dental problem, drooling, ear discharge, ear pain, facial swelling, mouth sores, nosebleeds, postnasal drip, rhinorrhea, sinus pressure, sneezing, tinnitus and trouble swallowing.   Respiratory: Negative.   Cardiovascular: Negative.   Psychiatric/Behavioral: Negative.     Per HPI unless specifically indicated above     Objective:    BP 117/80 mmHg  Pulse 69  Temp(Src) 97.9 F (36.6 C)  Wt 195 lb (88.451 kg)  SpO2 96%  Wt Readings from Last 3 Encounters:  11/17/15 195 lb (88.451 kg)  10/31/15 196 lb (88.905 kg)  10/31/15 196 lb (88.905 kg)    Physical Exam  Constitutional: She is oriented to person, place, and time. She appears well-developed and well-nourished. No distress.  HENT:  Head: Normocephalic and atraumatic.  Right Ear: Hearing and external ear normal.  Left Ear: Hearing and external ear normal.  Nose: Nose normal.  Mouth/Throat: Uvula is midline and mucous membranes are normal. Posterior oropharyngeal erythema present. No oropharyngeal exudate.  Eyes: Conjunctivae, EOM and lids are normal. Pupils are equal, round, and reactive to light. Right eye exhibits no discharge. Left eye exhibits no discharge. No scleral icterus.  Neck: Normal range of motion. Neck supple. No JVD present. No tracheal deviation present. No thyromegaly present.  Cardiovascular: Normal rate, regular rhythm, normal heart sounds and intact distal pulses.  Exam reveals no gallop and no friction rub.   No murmur heard. Pulmonary/Chest: Effort normal. No stridor. No respiratory distress. She has decreased breath sounds. She has no wheezes. She has rhonchi in the right  lower field and the left lower field. She has no rales. She exhibits no tenderness.  Musculoskeletal: Normal range of motion.  Lymphadenopathy:    She has no cervical adenopathy.   Neurological: She is alert and oriented to person, place, and time.  Skin: Skin is intact. No rash noted. She is not diaphoretic.  Psychiatric: She has a normal mood and affect. Her speech is normal and behavior is normal. Judgment and thought content normal. Cognition and memory are normal.  Nursing note and vitals reviewed.   Results for orders placed or performed in visit on 08/18/15  Basic metabolic panel  Result Value Ref Range   Glucose 179 (H) 65 - 99 mg/dL   BUN 7 6 - 24 mg/dL   Creatinine, Ser 1.61 0.57 - 1.00 mg/dL   GFR calc non Af Amer 91 >59 mL/min/1.73   GFR calc Af Amer 105 >59 mL/min/1.73   BUN/Creatinine Ratio 9 9 - 23   Sodium 141 134 - 144 mmol/L   Potassium 4.3 3.5 - 5.2 mmol/L   Chloride 100 96 - 106 mmol/L   CO2 25 18 - 29 mmol/L   Calcium 9.3 8.7 - 10.2 mg/dL  Bayer DCA Hb W9U Waived  Result Value Ref Range   Bayer DCA Hb A1c Waived 6.6 <7.0 %      Assessment & Plan:   Problem List Items Addressed This Visit      Cardiovascular and Mediastinum   Hypertension    Under goof control. Continue current regimen. Call with any concerns.       Relevant Medications   lisinopril (PRINIVIL,ZESTRIL) 30 MG tablet   Other Relevant Orders   Comprehensive metabolic panel   Microalbumin, Urine Waived     Respiratory   COPD (chronic obstructive pulmonary disease) (HCC)    Exacerbation resolved. Needs to use her steroid inhaler to prevent them. Will try rinsing with listerine after use. Recheck 3 months.       Relevant Medications   budesonide-formoterol (SYMBICORT) 160-4.5 MCG/ACT inhaler   albuterol (PROVENTIL) (2.5 MG/3ML) 0.083% nebulizer solution     Other   Hyperlipidemia    Under good control. Continue to monitor. Recheck 6 months.       Relevant Medications   lisinopril (PRINIVIL,ZESTRIL) 30 MG tablet   Other Relevant Orders   Lipid Panel Piccolo, Waived   Diet-controlled diabetes mellitus (HCC) - Primary    A1c 6.7- going up likely due to the  prednisone. Continue diet and exercise. Recheck 3 months.       Relevant Medications   lisinopril (PRINIVIL,ZESTRIL) 30 MG tablet   Other Relevant Orders   Bayer DCA Hb A1c Waived   Microalbumin, Urine Waived    Other Visit Diagnoses    Hard of hearing, bilateral        Would like to see ENT. Referral generated today.    Relevant Orders    Ambulatory referral to ENT    Sore throat and laryngitis        Thinks she swallowed a bug and has had a problem since then. Negative work up at [employer]. Would like to see ENT. Referral generated today.    Relevant Orders    Ambulatory referral to ENT        Follow up plan: Return in about 3 months (around 02/17/2016) for DM follow up.

## 2015-11-17 NOTE — Assessment & Plan Note (Signed)
A1c 6.7- going up likely due to the prednisone. Continue diet and exercise. Recheck 3 months.

## 2015-11-17 NOTE — Patient Instructions (Signed)

## 2015-11-17 NOTE — Assessment & Plan Note (Signed)
Under good control. Continue to monitor. Recheck 6 months.

## 2015-11-18 ENCOUNTER — Encounter: Payer: Self-pay | Admitting: Family Medicine

## 2015-11-18 LAB — COMPREHENSIVE METABOLIC PANEL
ALBUMIN: 3.7 g/dL (ref 3.5–5.5)
ALT: 43 IU/L — ABNORMAL HIGH (ref 0–32)
AST: 33 IU/L (ref 0–40)
Albumin/Globulin Ratio: 1.4 (ref 1.2–2.2)
Alkaline Phosphatase: 137 IU/L — ABNORMAL HIGH (ref 39–117)
BILIRUBIN TOTAL: 0.4 mg/dL (ref 0.0–1.2)
BUN / CREAT RATIO: 9 (ref 9–23)
BUN: 7 mg/dL (ref 6–24)
CHLORIDE: 101 mmol/L (ref 96–106)
CO2: 22 mmol/L (ref 18–29)
CREATININE: 0.74 mg/dL (ref 0.57–1.00)
Calcium: 9.1 mg/dL (ref 8.7–10.2)
GFR calc non Af Amer: 91 mL/min/{1.73_m2} (ref >59)
GFR, EST AFRICAN AMERICAN: 105 mL/min/{1.73_m2} (ref >59)
GLOBULIN, TOTAL: 2.7 g/dL (ref 1.5–4.5)
GLUCOSE: 197 mg/dL — AB (ref 65–99)
Potassium: 3.9 mmol/L (ref 3.5–5.2)
SODIUM: 142 mmol/L (ref 134–144)
TOTAL PROTEIN: 6.4 g/dL (ref 6.0–8.5)

## 2015-12-05 ENCOUNTER — Telehealth: Payer: Self-pay | Admitting: Family Medicine

## 2015-12-05 ENCOUNTER — Ambulatory Visit (INDEPENDENT_AMBULATORY_CARE_PROVIDER_SITE_OTHER): Payer: 59 | Admitting: Family Medicine

## 2015-12-05 ENCOUNTER — Encounter: Payer: Self-pay | Admitting: Family Medicine

## 2015-12-05 VITALS — BP 138/92 | HR 79 | Temp 97.9°F | Wt 193.0 lb

## 2015-12-05 DIAGNOSIS — J441 Chronic obstructive pulmonary disease with (acute) exacerbation: Secondary | ICD-10-CM

## 2015-12-05 MED ORDER — AMOXICILLIN-POT CLAVULANATE 875-125 MG PO TABS
1.0000 | ORAL_TABLET | Freq: Two times a day (BID) | ORAL | Status: DC
Start: 2015-12-05 — End: 2015-12-08

## 2015-12-05 MED ORDER — PREDNISONE 10 MG PO TABS: ORAL_TABLET | ORAL | Status: DC

## 2015-12-05 NOTE — Telephone Encounter (Signed)
No, not really, unless you want to work her in

## 2015-12-05 NOTE — Telephone Encounter (Signed)
Do we have any appointments?

## 2015-12-05 NOTE — Telephone Encounter (Signed)
Spoke with patients husband, patient is taking her mother to a doctors appointment, he will see if she can get here by 11:15, but he is not sure.

## 2015-12-05 NOTE — Telephone Encounter (Signed)
Let's have her come in at 11:15

## 2015-12-05 NOTE — Progress Notes (Signed)
BP 138/92 mmHg  Pulse 79  Temp(Src) 97.9 F (36.6 C)  Wt 193 lb (87.544 kg)  SpO2 98%   Subjective:    Patient ID: Monica Hull, female    DOB: 1960/10/11, 55 y.o.   MRN: [id number]  HPI: Monica Hull is a 54 y.o. female  Chief Complaint  Patient presents with  . Cough    X 2 days, patient has some pain in her ribs, she believes that it is from the cough   UPPER RESPIRATORY TRACT INFECTION Worst symptom: cough Fever: no Cough: yes Shortness of breath: yes Wheezing: yes Chest pain: yes, with cough Chest tightness: yes Chest congestion: yes Nasal congestion: no Runny nose: no Post nasal drip: no Sneezing: no Sore throat: no Swollen glands: no Sinus pressure: no Headache: no Face pain: no Toothache: no Ear pain: no  Ear pressure: no  Eyes red/itching:no Eye drainage/crusting: no  Vomiting: no Rash: no Fatigue: yes Sick contacts: yes- her grand daughters have been sick Strep contacts: no  Context: worse Recurrent sinusitis: no Relief with OTC cold/cough medications: no  Treatments attempted: cold/sinus and mucinex   Relevant past medical, surgical, family and social history reviewed and updated as indicated. Interim medical history since our last visit reviewed. Allergies and medications reviewed and updated.  Review of Systems  Constitutional: Negative.   Respiratory: Negative.   Cardiovascular: Negative.   Psychiatric/Behavioral: Negative.     Per HPI unless specifically indicated above     Objective:    BP 138/92 mmHg  Pulse 79  Temp(Src) 97.9 F (36.6 C)  Wt 193 lb (87.544 kg)  SpO2 98%  Wt Readings from Last 3 Encounters:  12/05/15 193 lb (87.544 kg)  11/17/15 195 lb (88.451 kg)  10/31/15 196 lb (88.905 kg)    Physical Exam  Constitutional: She is oriented to person, place, and time. She appears well-developed and well-nourished. No distress.  HENT:  Head: Normocephalic and atraumatic.  Right Ear: Hearing normal.  Left Ear:  Hearing normal.  Nose: Nose normal.  Eyes: Conjunctivae and lids are normal. Right eye exhibits no discharge. Left eye exhibits no discharge. No scleral icterus.  Cardiovascular: Normal rate, regular rhythm, normal heart sounds and intact distal pulses.  Exam reveals no gallop and no friction rub.   No murmur heard. Pulmonary/Chest: Effort normal. No respiratory distress. She has wheezes. She has no rales. She exhibits no tenderness.  Rhonchi bilaterally bases  Musculoskeletal: Normal range of motion.  Neurological: She is alert and oriented to person, place, and time.  Skin: Skin is warm, dry and intact. No rash noted. No erythema. No pallor.  Psychiatric: She has a normal mood and affect. Her speech is normal and behavior is normal. Judgment and thought content normal. Cognition and memory are normal.  Nursing note and vitals reviewed.   Results for orders placed or performed in visit on 11/17/15  Bayer DCA Hb A1c Waived  Result Value Ref Range   Bayer DCA Hb A1c Waived 6.7 <7.0 %  Lipid Panel Piccolo, Waived  Result Value Ref Range   Cholesterol Piccolo, Waived 191 <200 mg/dL   HDL Chol Piccolo, Waived 39 (L) >59 mg/dL   Triglycerides Piccolo,Waived 177 (H) <150 mg/dL   Chol/HDL Ratio Piccolo,Waive 4.9 mg/dL   LDL Chol Calc Piccolo Waived 117 (H) <100 mg/dL   VLDL Chol Calc Piccolo,Waive 35 (H) <30 mg/dL  Comprehensive metabolic panel  Result Value Ref Range   Glucose 197 (H) 65 - 99 mg/dL  BUN 7 6 - 24 mg/dL   Creatinine, Ser 1.610.74 0.57 - 1.00 mg/dL   GFR calc non Af Amer 91 >59 mL/min/1.73   GFR calc Af Amer 105 >59 mL/min/1.73   BUN/Creatinine Ratio 9 9 - 23   Sodium 142 134 - 144 mmol/L   Potassium 3.9 3.5 - 5.2 mmol/L   Chloride 101 96 - 106 mmol/L   CO2 22 18 - 29 mmol/L   Calcium 9.1 8.7 - 10.2 mg/dL   Total Protein 6.4 6.0 - 8.5 g/dL   Albumin 3.7 3.5 - 5.5 g/dL   Globulin, Total 2.7 1.5 - 4.5 g/dL   Albumin/Globulin Ratio 1.4 1.2 - 2.2   Bilirubin Total 0.4 0.0  - 1.2 mg/dL   Alkaline Phosphatase 137 (H) 39 - 117 IU/L   AST 33 0 - 40 IU/L   ALT 43 (H) 0 - 32 IU/L  Microalbumin, Urine Waived  Result Value Ref Range   Microalb, Ur Waived 30 (H) 0 - 19 mg/L   Creatinine, Urine Waived 300 10 - 300 mg/dL   Microalb/Creat Ratio <30 <30 mg/g      Assessment & Plan:   Problem List Items Addressed This Visit    None    Visit Diagnoses    COPD exacerbation (HCC)    -  Primary    Will start back on augmentin. Will start prednisone if not better in a couple of days. Recheck 3 weeks. Call if not getting better or getting worse.     Relevant Medications    predniSONE (DELTASONE) 10 MG tablet        Follow up plan: Return in about 3 weeks (around 12/26/2015) for Lung check.

## 2015-12-05 NOTE — Telephone Encounter (Signed)
Pt has a bad cough and would like to have something called in to [facility].

## 2015-12-07 ENCOUNTER — Telehealth: Payer: Self-pay | Admitting: Family Medicine

## 2015-12-07 NOTE — Telephone Encounter (Signed)
Pt's husband called stated RX's from yesterdays visit were called in to [facility] should have been sent to [facility] [place]. Thanks.

## 2015-12-08 MED ORDER — PREDNISONE 10 MG PO TABS: ORAL_TABLET | ORAL | Status: DC

## 2015-12-08 MED ORDER — AMOXICILLIN-POT CLAVULANATE 875-125 MG PO TABS: 1.0000 | ORAL_TABLET | Freq: Two times a day (BID) | ORAL | Status: AC

## 2015-12-08 NOTE — Telephone Encounter (Signed)
Dr.Johnson, can you send patients prescriptions from yesterday [facility]

## 2015-12-08 NOTE — Telephone Encounter (Signed)
Rx sent to he pharmacy

## 2015-12-26 ENCOUNTER — Ambulatory Visit (INDEPENDENT_AMBULATORY_CARE_PROVIDER_SITE_OTHER): Payer: 59 | Admitting: Family Medicine

## 2015-12-26 ENCOUNTER — Encounter: Payer: Self-pay | Admitting: Family Medicine

## 2015-12-26 VITALS — BP 113/75 | HR 75 | Temp 97.8°F | Ht 62.2 in | Wt 192.0 lb

## 2015-12-26 DIAGNOSIS — J438 Other emphysema: Secondary | ICD-10-CM | POA: Diagnosis not present

## 2015-12-26 DIAGNOSIS — H6091 Unspecified otitis externa, right ear: Secondary | ICD-10-CM | POA: Diagnosis not present

## 2015-12-26 MED ORDER — CIPROFLOXACIN-DEXAMETHASONE 0.3-0.1 % OT SUSP: 4.0000 [drp] | Freq: Two times a day (BID) | OTIC | Status: DC

## 2015-12-26 NOTE — Progress Notes (Signed)
BP 113/75 mmHg  Pulse 75  Temp(Src) 97.8 F (36.6 C)  Ht 5' 2.2" (1.58 m)  Wt 192 lb (87.091 kg)  BMI 34.89 kg/m2  SpO2 97%   Subjective:    Patient ID: Monica Hull, female    DOB: [birth year], 55 y.o.   MRN: [id number]  HPI: Monica AnisCynthia K Mill is a 55 y.o. female  Chief Complaint  Patient presents with  . lung check   Feeling much better. Her breathing is back to normal. Has finished her medicine. Still can't tolerate her inhalers with a steroid as it makes her mouth hurt. Not using her nebulizer as often as she should. No fevers. No chills.   EAR PAIN Duration: weeks Involved ear(s): right Severity:  moderate  Quality:  Dull and aching Fever: no Otorrhea: yes- had blood come out Upper respiratory infection symptoms: no Pruritus: no Hearing loss: yes Water immersion no Using Q-tips: no Recurrent otitis media: no Status: worse Treatments attempted: prednisone for her COPD ` Relevant past medical, surgical, family and social history reviewed and updated as indicated. Interim medical history since our last visit reviewed. Allergies and medications reviewed and updated.  Review of Systems  Constitutional: Negative.   HENT: Positive for ear discharge and ear pain. Negative for congestion, dental problem, drooling, facial swelling, hearing loss, mouth sores, nosebleeds, postnasal drip, rhinorrhea, sinus pressure, sneezing, sore throat, tinnitus, trouble swallowing and voice change.   Respiratory: Negative.   Cardiovascular: Negative.   Psychiatric/Behavioral: Negative.     Per HPI unless specifically indicated above     Objective:    BP 113/75 mmHg  Pulse 75  Temp(Src) 97.8 F (36.6 C)  Ht 5' 2.2" (1.58 m)  Wt 192 lb (87.091 kg)  BMI 34.89 kg/m2  SpO2 97%  Wt Readings from Last 3 Encounters:  12/26/15 192 lb (87.091 kg)  12/05/15 193 lb (87.544 kg)  11/17/15 195 lb (88.451 kg)    Physical Exam  Constitutional: She is oriented to person, place, and  time. She appears well-developed and well-nourished. No distress.  HENT:  Head: Normocephalic and atraumatic.  Right Ear: Hearing normal.  Left Ear: Hearing normal.  Nose: Nose normal.  Eyes: Conjunctivae and lids are normal. Right eye exhibits no discharge. Left eye exhibits no discharge. No scleral icterus.  Cardiovascular: Normal rate, regular rhythm, normal heart sounds and intact distal pulses.  Exam reveals no gallop and no friction rub.   No murmur heard. Pulmonary/Chest: Effort normal. No respiratory distress. She has decreased breath sounds in the right upper field, the right middle field, the right lower field, the left upper field, the left middle field and the left lower field. She has no wheezes. She has rhonchi in the right upper field, the right middle field, the right lower field, the left upper field, the left middle field and the left lower field. She has no rales. She exhibits no tenderness.  Musculoskeletal: Normal range of motion.  Neurological: She is alert and oriented to person, place, and time.  Skin: Skin is warm, dry and intact. No rash noted. No erythema. No pallor.  Psychiatric: She has a normal mood and affect. Her speech is normal and behavior is normal. Judgment and thought content normal. Cognition and memory are normal.  Nursing note and vitals reviewed.   Results for orders placed or performed in visit on 11/17/15  Bayer DCA Hb A1c Waived  Result Value Ref Range   Bayer DCA Hb A1c Waived 6.7 <7.0 %  Lipid Panel  Piccolo, Arrow ElectronicsWaived  Result Value Ref Range   Cholesterol Piccolo, Waived 191 <200 mg/dL   HDL Chol Piccolo, Waived 39 (L) >59 mg/dL   Triglycerides Piccolo,Waived 177 (H) <150 mg/dL   Chol/HDL Ratio Piccolo,Waive 4.9 mg/dL   LDL Chol Calc Piccolo Waived 117 (H) <100 mg/dL   VLDL Chol Calc Piccolo,Waive 35 (H) <30 mg/dL  Comprehensive metabolic panel  Result Value Ref Range   Glucose 197 (H) 65 - 99 mg/dL   BUN 7 6 - 24 mg/dL   Creatinine, Ser  1.610.74 0.57 - 1.00 mg/dL   GFR calc non Af Amer 91 >59 mL/min/1.73   GFR calc Af Amer 105 >59 mL/min/1.73   BUN/Creatinine Ratio 9 9 - 23   Sodium 142 134 - 144 mmol/L   Potassium 3.9 3.5 - 5.2 mmol/L   Chloride 101 96 - 106 mmol/L   CO2 22 18 - 29 mmol/L   Calcium 9.1 8.7 - 10.2 mg/dL   Total Protein 6.4 6.0 - 8.5 g/dL   Albumin 3.7 3.5 - 5.5 g/dL   Globulin, Total 2.7 1.5 - 4.5 g/dL   Albumin/Globulin Ratio 1.4 1.2 - 2.2   Bilirubin Total 0.4 0.0 - 1.2 mg/dL   Alkaline Phosphatase 137 (H) 39 - 117 IU/L   AST 33 0 - 40 IU/L   ALT 43 (H) 0 - 32 IU/L  Microalbumin, Urine Waived  Result Value Ref Range   Microalb, Ur Waived 30 (H) 0 - 19 mg/L   Creatinine, Urine Waived 300 10 - 300 mg/dL   Microalb/Creat Ratio <30 <30 mg/g      Assessment & Plan:   Problem List Items Addressed This Visit      Respiratory   COPD (chronic obstructive pulmonary disease) (HCC) - Primary    Not able to tolerate most medicines. Lungs still rhoncherous, but she is feeling better. Will get her into pulmonology to hopefully prevent as often flares. Referral generated today.      Relevant Orders   Ambulatory referral to Pulmonology    Other Visit Diagnoses    Otitis externa, right        Will treat with ciprodex as TM occluded by blood and unclear if perforated. Call if not better or worse.         Follow up plan: Return As scheduled.

## 2015-12-26 NOTE — Patient Instructions (Signed)
Otitis Externa Otitis externa is a germ infection in the outer ear. The outer ear is the area from the eardrum to the outside of the ear. Otitis externa is sometimes called "swimmer's ear." HOME CARE  Put drops in the ear as told by your doctor.  Only take medicine as told by your doctor.  If you have diabetes, your doctor may give you more directions. Follow your doctor's directions.  Keep all doctor visits as told. To avoid another infection:  Keep your ear dry. Use the corner of a towel to dry your ear after swimming or bathing.  Avoid scratching or putting things inside your ear.  Avoid swimming in lakes, dirty water, or pools that use a chemical called chlorine poorly.  You may use ear drops after swimming. Combine equal amounts of white vinegar and alcohol in a bottle. Put 3 or 4 drops in each ear. GET HELP IF:   You have a fever.  Your ear is still red, puffy (swollen), or painful after 3 days.  You still have yellowish-white fluid (pus) coming from the ear after 3 days.  Your redness, puffiness, or pain gets worse.  You have a really bad headache.  You have redness, puffiness, pain, or tenderness behind your ear. MAKE SURE YOU:   Understand these instructions.  Will watch your condition.  Will get help right away if you are not doing well or get worse.   This information is not intended to replace advice given to you by your health care provider. Make sure you discuss any questions you have with your health care provider.   Document Released: 11/28/2007 Document Revised: 07/02/2014 Document Reviewed: 06/28/2011 Elsevier Interactive Patient Education 2016 [employer]vier Inc.  

## 2015-12-26 NOTE — Assessment & Plan Note (Signed)
Not able to tolerate most medicines. Lungs still rhoncherous, but she is feeling better. Will get her into pulmonology to hopefully prevent as often flares. Referral generated today.

## 2016-01-10 ENCOUNTER — Encounter: Payer: Self-pay | Admitting: Internal Medicine

## 2016-01-10 ENCOUNTER — Ambulatory Visit (INDEPENDENT_AMBULATORY_CARE_PROVIDER_SITE_OTHER): Payer: 59 | Admitting: Internal Medicine

## 2016-01-10 VITALS — BP 136/74 | HR 74 | Ht 62.0 in | Wt 190.8 lb

## 2016-01-10 DIAGNOSIS — E669 Obesity, unspecified: Secondary | ICD-10-CM | POA: Diagnosis not present

## 2016-01-10 DIAGNOSIS — J438 Other emphysema: Secondary | ICD-10-CM | POA: Diagnosis not present

## 2016-01-10 DIAGNOSIS — Z720 Tobacco use: Secondary | ICD-10-CM

## 2016-01-10 DIAGNOSIS — J069 Acute upper respiratory infection, unspecified: Secondary | ICD-10-CM | POA: Diagnosis not present

## 2016-01-10 DIAGNOSIS — Z716 Tobacco abuse counseling: Secondary | ICD-10-CM

## 2016-01-10 MED ORDER — NICOTINE 21-14-7 MG/24HR TD KIT: PACK | TRANSDERMAL | Status: DC

## 2016-01-10 NOTE — Assessment & Plan Note (Signed)
A shoulder Lee 7 primary care visits for COPD exacerbation and upper story tract infections this year. I suspect her continued tobacco abuse along with uncontrolled COPD is the reason for her recurrent upper spray tract infections.  Plan: -Currently back to baseline breathing, and using Symbicort 1 puff twice a day -Continue with Symbicort

## 2016-01-10 NOTE — Patient Instructions (Addendum)
Follow up with Dr. Stevenson Clinch in 6 weeks - pulmonary function testing and 6 minute walk test prior to follow up - Cut back on smoking each week and eventually stop smoking - We will give a prescription for nicotine patches, this is a kit, alternate patch sites every day. May use low-dose Benadryl if irritation develops. Patch prescription - 66mg x  4 weeks, then 160m x 2 weeks, then 7 mcg x 2 weeks.  - Avoid any forms of secondhand smoke, including, E cigarettes, vapors, etc. - continue with Symbicort 1 puff twice a day, please gargle and rinse after each use - please utilize 1-800-QUIT-NOW, if you cannot obtain or afford the nicotine patches.

## 2016-01-10 NOTE — Assessment & Plan Note (Signed)
Patient counseled for greater than 12 minutes on tobacco abuse.  See plan for tobacco abuse

## 2016-01-10 NOTE — Progress Notes (Signed)
[facility] Pulmonary Medicine Consultation    Date: 01/10/2016  MRN# [id number] Monica Hull 12/07/1960  Referring Physician: Dr. Laural Benes PMD - Dr. Antony Blackbird Monica Hull is a 55 y.o. old female seen in consultation for COPD optimization recurrent upper spray tract infections.  CC:  Chief Complaint  Patient presents with  . pulmonary consult    per Dr. Laural Benes. pt c/o sob w/exertion, non prod cough, wheezing & chest tightness X38mo     HPI:  Patient is a pleasant 55 year old female seen in consultation today for recurrent upper spray tract infections along with COPD exacerbation. She states that she is seen primary care physician about 6-7 times this year for COPD exacerbation/upper story tract infections, treated multiple times with antibiotics steroids and nebulizers. Review of records shows that she's been on different antibiotics including Cipro and doxycycline. Her most recent infection was about a month and a half ago, which she received a prednisone Dosepak and another round of antibiotics. Her primary care physician has tried inhalers, but the steroid component seems to be causing all thrush and mild sores. However, she has been using Symbicort properly for the past month and her breathing has improved and now is back to baseline. She states that she still smoking about 1-2 packs per day. She smoked 1-2 packs per day for about 43 years. Her COPD was diagnosed about 10-15 years ago by a local pulmonologist, but she could not recall her last pulmonary function testing. COPD exacerbation symptoms include nonproductive cough increase in nature, shortness of breath, fatigue. She states that usually winter months a worse for her COPD. They have 1 dog at home, she is currently unemployed, no previous jobs with exposure to dust or smoke. Today she endorses chronic shortness of breath with exertion, especially with incline or walking up one flight of stairs. She can walk about  50 yards before having to take a break, on flat surfaces.  Again, today she states that her breathing is back to baseline. She does not endorse a chronic morning or smoker's cough.    PMHX:   Past Medical History  Diagnosis Date  . Asthma   . GERD (gastroesophageal reflux disease)   . Hyperlipidemia   . Hypertension   . Impaired fasting glucose   . COPD (chronic obstructive pulmonary disease) (HCC)   . Tobacco abuse   . Elevated liver function tests   . Carpal tunnel syndrome   . Sleep apnea     Not using CPAP- referred to sleep center  . Diabetes mellitus without complication (HCC)     DIET CONTROLLED  . Chronic kidney disease     H/O KIDNEY STONES  . Headache     MIGRAINES   Surgical Hx:  Past Surgical History  Procedure Laterality Date  . Cesarean section      X2  . Carpal tunnel release    . Palate / uvula biopsy / excision    . Hysteroscopy w/d&c N/A 07/11/2015    Procedure: DILATATION AND CURETTAGE /HYSTEROSCOPY;  Surgeon: Hildred Laser, MD;  Location: [facility] ORS;  Service: Gynecology;  Laterality: N/A;   Family Hx:  Family History  Problem Relation Age of Onset  . Arthritis Mother   . Asthma Mother   . Diabetes Mother   . Heart disease Mother   . Hyperlipidemia Mother   . Hypertension Mother   . Kidney disease Mother   . Lung disease Mother   . Alcohol abuse Father   . Hypertension Father   .  Hyperlipidemia Father   . Diabetes Brother    Social Hx:   Social History  Substance Use Topics  . Smoking status: Current Every Day Smoker -- 1.50 packs/day for 39 years    Types: Cigarettes  . Smokeless tobacco: Never Used  . Alcohol Use: No   Medication:   Current Outpatient Rx  Name  Route  Sig  Dispense  Refill  . albuterol (PROVENTIL) (2.5 MG/3ML) 0.083% nebulizer solution   Nebulization   Take 3 mLs (2.5 mg total) by nebulization every 6 (six) hours as needed for wheezing or shortness of breath.   150 mL   1   . budesonide-formoterol (SYMBICORT)  160-4.5 MCG/ACT inhaler   Inhalation   Inhale 2 puffs into the lungs 2 (two) times daily.   3 Inhaler   3   . ciprofloxacin-dexamethasone (CIPRODEX) otic suspension   Right Ear   Place 4 drops into the right ear 2 (two) times daily.   7.5 mL   0   . diclofenac (VOLTAREN) 75 MG EC tablet   Oral   Take 1 tablet (75 mg total) by mouth 2 (two) times daily as needed.   60 tablet   3   . fesoterodine (TOVIAZ) 8 MG TB24 tablet   Oral   Take 1 tablet (8 mg total) by mouth daily. Patient taking differently: Take 8 mg by mouth 2 (two) times daily.    30 tablet   11   . lisinopril (PRINIVIL,ZESTRIL) 30 MG tablet   Oral   Take 1 tablet (30 mg total) by mouth daily.   90 tablet   1     No refills available   . pantoprazole (PROTONIX) 40 MG tablet   Oral   Take 1 tablet (40 mg total) by mouth every morning.   90 tablet   1   . tiZANidine (ZANAFLEX) 4 MG capsule   Oral   Take 4 mg by mouth 3 (three) times daily as needed for muscle spasms. Reported on 07/11/2015             Allergies:  Anoro ellipta and Biaxin  Review of Systems  Constitutional: Negative for fever and chills.  Eyes: Negative for blurred vision and double vision.  Respiratory: Positive for shortness of breath. Negative for cough.   Cardiovascular: Negative for chest pain.  Gastrointestinal: Negative for heartburn and nausea.  Genitourinary: Negative for dysuria.  Musculoskeletal: Negative for myalgias.  Skin: Negative for rash.  Neurological: Negative for dizziness and headaches.  Endo/Heme/Allergies: Does not bruise/bleed easily.  Psychiatric/Behavioral: Negative for depression.     Physical Examination:   VS: BP 136/74 mmHg  Pulse 74  Ht  (1.575 m)  Wt 190 lb 12.8 oz (86.546 kg)  BMI 34.89 kg/m2  SpO2 92%  General Appearance: No distress  Neuro:without focal findings, mental status, speech normal, alert and oriented, cranial nerves 2-12 intact, reflexes normal and symmetric, sensation  grossly normal  HEENT: PERRLA, EOM intact, no ptosis, no other lesions noticed; Mallampati 3 Pulmonary: dec basilar BS, fine expiratory wheezes that the bases, good respiratory effort. diaphragmatic excursion normal.No wheezing, No rales;   Sputum Production:   CardiovascularNormal S1,S2.  No m/r/g.  Abdominal aorta pulsation normal.    Abdomen: Benign, Soft, non-tender, No masses, hepatosplenomegaly, No lymphadenopathy Renal:  No costovertebral tenderness  GU:  No performed at this time. Endoc: No evident thyromegaly, no signs of acromegaly or Cushing features Skin:   warm, no rashes, no ecchymosis  Extremities: normal, no  cyanosis, clubbing, no edema, warm with normal capillary refill. Other findings:     Assessment and Plan: 55 year old female seen in consultation for COPD optimization Recurrent URI (upper respiratory infection) A shoulder Lee 7 primary care visits for COPD exacerbation and upper story tract infections this year. I suspect her continued tobacco abuse along with uncontrolled COPD is the reason for her recurrent upper spray tract infections.  Plan: -Currently back to baseline breathing, and using Symbicort 1 puff twice a day -Continue with Symbicort  COPD (chronic obstructive pulmonary disease) Patient with known history of COPD. She appeared to strive multiple inhalers in the past, the ones with an inhaled corticosteroid does cause thrush and mouth sores. Currently is using Symbicort 160/4.5 one puff twice a day for the past month with no significant oral issues.  Her tobacco use is significant, and tobacco avoidance and eventually quitting is paramount to his overall improvement in her respiratory status. This was explained to her and her husband today  Plan: -Continue with Symbicort 1 puff twice a day, gargle and rinse after each use -Tobacco avoidance -Pulmonary function testing and 6 minute walk test  Tobacco abuse Tobacco Cessation - Counseling regarding  benefits of smoking cessation strategies was provided for more than 12 min. - Educated that at this time smoking- cessation represents the single most important step that patient can take to enhance the length and quality of live. - Educated patient regarding alternatives of behavior interventions, pharmacotherapy including NRT and non-nicotine therapy such, and combinations of both. - Patient at this time: willing to try the nicotine patches, rx given.    Obesity (BMI 30.0-34.9) OBESITY  Discussed importance of weight reduction.  Educated regarding limitation of  intake of greasy/fried foods.  Instructed on benefit of  a low-impact exercise program, starting slowly.  Discussed benefits of 30-45 minutes of some form of exercise daily as well as benefit of supervised exercise program.       Updated Medication List Outpatient Encounter Prescriptions as of 01/10/2016  Medication Sig  . albuterol (PROVENTIL) (2.5 MG/3ML) 0.083% nebulizer solution Take 3 mLs (2.5 mg total) by nebulization every 6 (six) hours as needed for wheezing or shortness of breath.  . budesonide-formoterol (SYMBICORT) 160-4.5 MCG/ACT inhaler Inhale 2 puffs into the lungs 2 (two) times daily.  . ciprofloxacin-dexamethasone (CIPRODEX) otic suspension Place 4 drops into the right ear 2 (two) times daily.  . diclofenac (VOLTAREN) 75 MG EC tablet Take 1 tablet (75 mg total) by mouth 2 (two) times daily as needed.  . fesoterodine (TOVIAZ) 8 MG TB24 tablet Take 1 tablet (8 mg total) by mouth daily. (Patient taking differently: Take 8 mg by mouth 2 (two) times daily. )  . lisinopril (PRINIVIL,ZESTRIL) 30 MG tablet Take 1 tablet (30 mg total) by mouth daily.  . pantoprazole (PROTONIX) 40 MG tablet Take 1 tablet (40 mg total) by mouth every morning.  Marland Kitchen tiZANidine (ZANAFLEX) 4 MG capsule Take 4 mg by mouth 3 (three) times daily as needed for muscle spasms. Reported on 07/11/2015   No facility-administered encounter medications on  file as of 01/10/2016.    Orders for this visit: Orders Placed This Encounter  Procedures  . Pulmonary function test    Standing Status: Future     Number of Occurrences:      Standing Expiration Date: 01/09/2017    Order Specific Question:  Where should this test be performed?    Answer:  [facility]    Order Specific Question:  Full PFT:  includes the following: basic spirometry, spirometry pre & post bronchodilator, diffusion capacity (DLCO), lung volumes    Answer:  Full PFT  . 6 minute walk    Standing Status: Future     Number of Occurrences:      Standing Expiration Date: 01/09/2017    Order Specific Question:  Where should this test be performed?    Answer:  Other     Thank  you for the consultation and for allowing [facility] to assist in the care of your patient. Our recommendations are noted above.  Please contact us if we can be of further service.   Stephanie AcreVishal Soledad Budreau, MD [facility] Office Number: [phone]  Note: This note was prepared with Dragon dictation along with smaller phrase technology. Any transcriptional errors that result from this process are unintentional.

## 2016-01-10 NOTE — Assessment & Plan Note (Signed)
Tobacco Cessation - Counseling regarding benefits of smoking cessation strategies was provided for more than 12 min. - Educated that at this time smoking- cessation represents the single most important step that patient can take to enhance the length and quality of live. - Educated patient regarding alternatives of behavior interventions, pharmacotherapy including NRT and non-nicotine therapy such, and combinations of both. - Patient at this time: willing to try the nicotine patches, rx given.

## 2016-01-10 NOTE — Assessment & Plan Note (Signed)
Patient with known history of COPD. She appeared to strive multiple inhalers in the past, the ones with an inhaled corticosteroid does cause thrush and mouth sores. Currently is using Symbicort 160/4.5 one puff twice a day for the past month with no significant oral issues.  Her tobacco use is significant, and tobacco avoidance and eventually quitting is paramount to his overall improvement in her respiratory status. This was explained to her and her husband today  Plan: -Continue with Symbicort 1 puff twice a day, gargle and rinse after each use -Tobacco avoidance -Pulmonary function testing and 6 minute walk test

## 2016-01-10 NOTE — Assessment & Plan Note (Signed)
OBESITY  Discussed importance of weight reduction.  Educated regarding limitation of  intake of greasy/fried foods.  Instructed on benefit of  a low-impact exercise program, starting slowly.  Discussed benefits of 30-45 minutes of some form of exercise daily as well as benefit of supervised exercise program.    

## 2016-01-13 ENCOUNTER — Telehealth: Payer: Self-pay | Admitting: Family Medicine

## 2016-01-13 NOTE — Telephone Encounter (Signed)
Routing to provider  

## 2016-01-13 NOTE — Telephone Encounter (Signed)
Pt called and stated that her grand-daughter who is of the home has been diagnosed with strep throat and she has been told by Dr Laural BenesJohnson as well as her Pulmonologist that she shouldn't be around any sickness if possible and she would like to know what she should do.

## 2016-01-16 NOTE — Telephone Encounter (Signed)
Try to stay away from her. Do not share drinks or give hugs or kissed. If her throat starts hurting. Let me know.

## 2016-01-16 NOTE — Telephone Encounter (Signed)
Patient notified

## 2016-01-18 ENCOUNTER — Telehealth: Payer: Self-pay | Admitting: *Deleted

## 2016-01-18 NOTE — Telephone Encounter (Signed)
Initiated PA for Nicotine patches thru [facility]. Key: [id number]# [id number]  Submitted for review.  [employer] [place] ([phone]  [phone]

## 2016-01-18 NOTE — Telephone Encounter (Signed)
Nicotine patches approved. Karin Golden informed.

## 2016-02-02 ENCOUNTER — Telehealth: Payer: Self-pay | Admitting: Family Medicine

## 2016-02-02 ENCOUNTER — Other Ambulatory Visit: Payer: Self-pay | Admitting: Family Medicine

## 2016-02-02 NOTE — Telephone Encounter (Signed)
[facility]  [facility]

## 2016-02-02 NOTE — Telephone Encounter (Signed)
Notified patient that [facility] had [facility].

## 2016-02-13 ENCOUNTER — Ambulatory Visit (INDEPENDENT_AMBULATORY_CARE_PROVIDER_SITE_OTHER): Payer: 59 | Admitting: *Deleted

## 2016-02-13 DIAGNOSIS — R0600 Dyspnea, unspecified: Secondary | ICD-10-CM

## 2016-02-13 DIAGNOSIS — J438 Other emphysema: Secondary | ICD-10-CM | POA: Diagnosis not present

## 2016-02-13 NOTE — Progress Notes (Signed)
SMW performed today. 

## 2016-02-16 ENCOUNTER — Ambulatory Visit (INDEPENDENT_AMBULATORY_CARE_PROVIDER_SITE_OTHER): Payer: 59 | Admitting: *Deleted

## 2016-02-16 ENCOUNTER — Ambulatory Visit (INDEPENDENT_AMBULATORY_CARE_PROVIDER_SITE_OTHER): Payer: 59 | Admitting: Cardiovascular Disease

## 2016-02-16 ENCOUNTER — Encounter: Payer: Self-pay | Admitting: Cardiovascular Disease

## 2016-02-16 VITALS — BP 120/90 | HR 73 | Ht 62.5 in | Wt 191.5 lb

## 2016-02-16 DIAGNOSIS — I6521 Occlusion and stenosis of right carotid artery: Secondary | ICD-10-CM

## 2016-02-16 DIAGNOSIS — J438 Other emphysema: Secondary | ICD-10-CM

## 2016-02-16 DIAGNOSIS — I6529 Occlusion and stenosis of unspecified carotid artery: Secondary | ICD-10-CM | POA: Insufficient documentation

## 2016-02-16 DIAGNOSIS — Z720 Tobacco use: Secondary | ICD-10-CM | POA: Diagnosis not present

## 2016-02-16 DIAGNOSIS — E119 Type 2 diabetes mellitus without complications: Secondary | ICD-10-CM

## 2016-02-16 DIAGNOSIS — I10 Essential (primary) hypertension: Secondary | ICD-10-CM

## 2016-02-16 DIAGNOSIS — R079 Chest pain, unspecified: Secondary | ICD-10-CM

## 2016-02-16 DIAGNOSIS — E669 Obesity, unspecified: Secondary | ICD-10-CM

## 2016-02-16 DIAGNOSIS — E785 Hyperlipidemia, unspecified: Secondary | ICD-10-CM

## 2016-02-16 LAB — PULMONARY FUNCTION TEST
DL/VA % PRED: 106 %
DL/VA: 4.89 ml/min/mmHg/L
DLCO UNC: 21.14 ml/min/mmHg
DLCO unc % pred: 95 %
FEF 25-75 Post: 0.91 L/sec
FEF 25-75 Pre: 0.61 L/sec
FEF2575-%CHANGE-POST: 50 %
FEF2575-%PRED-PRE: 24 %
FEF2575-%Pred-Post: 37 %
FEV1-%CHANGE-POST: 13 %
FEV1-%PRED-POST: 62 %
FEV1-%Pred-Pre: 55 %
FEV1-PRE: 1.39 L
FEV1-Post: 1.58 L
FEV1FVC-%Change-Post: 15 %
FEV1FVC-%Pred-Pre: 68 %
FEV6-%Change-Post: -3 %
FEV6-%PRED-POST: 79 %
FEV6-%PRED-PRE: 82 %
FEV6-POST: 2.49 L
FEV6-Pre: 2.57 L
FEV6FVC-%CHANGE-POST: 0 %
FEV6FVC-%PRED-POST: 103 %
FEV6FVC-%Pred-Pre: 102 %
FVC-%Change-Post: -1 %
FVC-%PRED-PRE: 80 %
FVC-%Pred-Post: 78 %
FVC-POST: 2.54 L
FVC-PRE: 2.59 L
POST FEV6/FVC RATIO: 100 %
PRE FEV1/FVC RATIO: 54 %
PRE FEV6/FVC RATIO: 99 %
Post FEV1/FVC ratio: 62 %

## 2016-02-16 MED ORDER — ATORVASTATIN CALCIUM 20 MG PO TABS: 20.0000 mg | ORAL_TABLET | Freq: Every day | ORAL | 11 refills | Status: DC

## 2016-02-16 NOTE — Patient Instructions (Addendum)
Medication Instructions:   Please start aspirin 81 mg daily Start atorvastatin 20 mg daily  Ask Dr. Laural BenesJohnson about referral to ENT Ask about thyroid ultrasound on right 18 mm  Labwork:  Labs with Primary Care  Testing/Procedures:  No new testing  Follow-Up: It was a pleasure seeing you in the office today. Please call us if you have new issues that need to be addressed before your next appt.  [phone]  Your physician wants you to follow-up in: 12 months.  You will receive a reminder letter in the mail two months in advance. If you don't receive a letter, please call our office to schedule the follow-up appointment.  If you need a refill on your cardiac medications before your next appointment, please call your pharmacy.

## 2016-02-16 NOTE — Progress Notes (Signed)
PFT performed today with Nitrogen washout. 

## 2016-02-16 NOTE — Progress Notes (Signed)
Cardiology Office Note  Date:  02/16/2016   ID:  Monica Hull, DOB [birth year], MRN [id number]  PCP:  Park Liter, DO   Chief Complaint  Patient presents with  . Other    1 yr f/u c/o sob and leg pain/cramping. Meds reviewed verbally with pt.    HPI:  Monica Hull is a 54 year old woman with history of smoking, GERD, COPD, hypertension, Previous episodes of chest pain and syncope dating back to 2000 who presents for symptoms of chest pain and syncope. Smoking 1 ppd  In follow-up today, she denies any recent episodes of chest pain, no recent episodes of syncope She has been having difficulty with COPD exacerbation, chronic infections  Recent CT scan of the head and neck reviewed with her Report details thyroid nodule, sinus disease, mastoid disease Images reviewed showing heavy carotid calcification on the right No significant atherosclerosis in the aortic arch  She reports having Leg cramps in the day, worse at night Trying to quit smoking, no regular exercise program She continues to smoke   tried Wellbutrin, considering Chantix, tried nicotine patches  Reports that she is tired in the daytime Husband reports that she is a heavy snorer No previous sleep study  Lab work reviewed with her showing total cholesterol around 200, LDL 1:15  EKG on today's visit shows normal sinus rhythm with rate 73 bpm, no significant ST or T-wave changes  Other past medical history rare episodes of syncope dating back to 2000 Symptoms typically present with nausea of uncertain etiology, followed by lightheadedness and syncope. Suspected to be vasovagal   typically passes out for 5-10 minutes at a time. Lips go blue, she is limp. Eventually she comes to.  GERD symptoms.  Had previous EGD   PMH:   has a past medical history of Asthma; Carpal tunnel syndrome; Chronic kidney disease; COPD (chronic obstructive pulmonary disease) ([place]); Diabetes mellitus without complication ([place]); Elevated  liver function tests; GERD (gastroesophageal reflux disease); Headache; Hyperlipidemia; Hypertension; Impaired fasting glucose; Sleep apnea; and Tobacco abuse.  PSH:    Past Surgical History:  Procedure Laterality Date  . CARPAL TUNNEL RELEASE    . CESAREAN SECTION     X2  . HYSTEROSCOPY W/D&C N/A 07/11/2015   Procedure: DILATATION AND CURETTAGE /HYSTEROSCOPY;  Surgeon: Rubie Maid, MD;  Location: [facility] ORS;  Service: Gynecology;  Laterality: N/A;  . PALATE / UVULA BIOPSY / EXCISION      Current Outpatient Prescriptions  Medication Sig Dispense Refill  . albuterol (PROVENTIL) (2.5 MG/3ML) 0.083% nebulizer solution Take 3 mLs (2.5 mg total) by nebulization every 6 (six) hours as needed for wheezing or shortness of breath. 150 mL 1  . budesonide-formoterol (SYMBICORT) 160-4.5 MCG/ACT inhaler Inhale 2 puffs into the lungs 2 (two) times daily. 3 Inhaler 3  . diclofenac (VOLTAREN) 75 MG EC tablet TAKE 1 TABLET (75 MG TOTAL) BY MOUTH 2 (TWO) TIMES DAILY AS NEEDED. 60 tablet 2  . fesoterodine (TOVIAZ) 8 MG TB24 tablet Take 1 tablet (8 mg total) by mouth daily. (Patient taking differently: Take 8 mg by mouth 2 (two) times daily. ) 30 tablet 11  . lisinopril (PRINIVIL,ZESTRIL) 30 MG tablet Take 1 tablet (30 mg total) by mouth daily. 90 tablet 1  . Nicotine 21-14-7 MG/24HR KIT Use as directed 56 each 0  . pantoprazole (PROTONIX) 40 MG tablet Take 1 tablet (40 mg total) by mouth every morning. 90 tablet 1  . tiZANidine (ZANAFLEX) 4 MG capsule Take 4 mg by mouth 3 (three)  times daily as needed for muscle spasms. Reported on 07/11/2015    . aspirin EC 81 MG tablet Take 1 tablet (81 mg total) by mouth daily. 90 tablet 3  . atorvastatin (LIPITOR) 20 MG tablet Take 1 tablet (20 mg total) by mouth daily. 30 tablet 11   No current facility-administered medications for this visit.      Allergies:   Anoro ellipta [umeclidinium-vilanterol] and Biaxin [clarithromycin]   Social History:  The patient  reports  that she has been smoking Cigarettes.  She has a 58.50 pack-year smoking history. She has never used smokeless tobacco. She reports that she does not drink alcohol or use drugs.   Family History:   family history includes Alcohol abuse in her father; Arthritis in her mother; Asthma in her mother; Diabetes in her brother and mother; Heart disease in her mother; Hyperlipidemia in her father and mother; Hypertension in her father and mother; Kidney disease in her mother; Lung disease in her mother.    Review of Systems: Review of Systems  Constitutional: Negative.   Respiratory: Positive for cough and shortness of breath.   Cardiovascular: Negative.   Gastrointestinal: Negative.   Musculoskeletal: Negative.   Neurological: Negative.   Psychiatric/Behavioral: Negative.   All other systems reviewed and are negative.    PHYSICAL EXAM: VS:  BP 120/90 (BP Location: Left Arm, Patient Position: Sitting, Cuff Size: Normal)   Pulse 73   Ht 5' 2.5" (1.588 m)   Wt 191 lb 8 oz (86.9 kg)   BMI 34.47 kg/m  , BMI Body mass index is 34.47 kg/m. GEN: Well nourished, well developed, in no acute distress  HEENT: normal  Neck: no JVD, carotid bruits, or masses Cardiac: RRR; no murmurs, rubs, or gallops,no edema  Respiratory:  clear to auscultation bilaterally, normal work of breathing GI: soft, nontender, nondistended, + BS MS: no deformity or atrophy  Skin: warm and dry, no rash Neuro:  Strength and sensation are intact Psych: euthymic mood, full affect    Recent Labs: 07/06/2015: Hemoglobin 15.9; Platelets 238 11/17/2015: ALT 43; BUN 7; Creatinine, Ser 0.74; Potassium 3.9; Sodium 142    Lipid Panel Lab Results  Component Value Date   CHOL 191 11/17/2015   HDL 35 (L) 12/21/2014   LDLCALC 103 (H) 12/21/2014   TRIG 223 (H) 12/21/2014      Wt Readings from Last 3 Encounters:  02/16/16 191 lb 8 oz (86.9 kg)  01/10/16 190 lb 12.8 oz (86.5 kg)  12/26/15 192 lb (87.1 kg)        ASSESSMENT AND PLAN:  Essential hypertension - Plan: EKG 12-Lead Blood pressure is well controlled on today's visit. No changes made to the medications.  Carotid stenosis, right CT scan images reviewed in detail in the office with patient and husband Tobacco cessation recommended   emphysema ([place]) Smoking cessation recommended Followed by pulmonary, has follow-up next week Diet-controlled diabetes mellitus ([place])  Obesity (BMI 30.0-34.9) We have encouraged continued exercise, careful diet management in an effort to lose weight.  Chest pain, unspecified chest pain type Denies having significant chest pain on today's visit Minimal aortic plaquing, smoking cessation recommended  Hyperlipidemia Recommended she start Lipitor 20 mg daily, goal LDL less than 70 given her carotid disease Would repeat lipid panel on routine follow-up with primary care   Total encounter time more than 25 minutes  Greater than 50% was spent in counseling and coordination of care with the patient   Disposition:   F/U  6 months  Orders Placed This Encounter  Procedures  . EKG 12-Lead     Signed, Esmond Plants, M.D., Ph.D. 02/16/2016  [place], [place]  he

## 2016-02-17 ENCOUNTER — Ambulatory Visit: Payer: Self-pay

## 2016-02-17 ENCOUNTER — Ambulatory Visit: Payer: 59 | Admitting: Family Medicine

## 2016-02-20 ENCOUNTER — Ambulatory Visit (INDEPENDENT_AMBULATORY_CARE_PROVIDER_SITE_OTHER): Payer: 59 | Admitting: Internal Medicine

## 2016-02-20 ENCOUNTER — Encounter: Payer: Self-pay | Admitting: Internal Medicine

## 2016-02-20 VITALS — BP 136/96 | HR 71 | Ht 62.0 in | Wt 191.0 lb

## 2016-02-20 DIAGNOSIS — Z720 Tobacco use: Secondary | ICD-10-CM | POA: Diagnosis not present

## 2016-02-20 DIAGNOSIS — J438 Other emphysema: Secondary | ICD-10-CM

## 2016-02-20 DIAGNOSIS — F17200 Nicotine dependence, unspecified, uncomplicated: Secondary | ICD-10-CM | POA: Insufficient documentation

## 2016-02-20 DIAGNOSIS — F17210 Nicotine dependence, cigarettes, uncomplicated: Secondary | ICD-10-CM | POA: Insufficient documentation

## 2016-02-20 NOTE — Progress Notes (Signed)
[facility] Consultation      MRN# [id number] Monica Hull [birth year]   CC: Chief Complaint  Patient presents with  . Follow-up    Breathing is unchanged since last OV. Reports SOB, dry cough. Denies wheezing or chest tightness.      Brief History: 55 yo F with recurrent URI, AECOPDs, related to continue tobacco abuse.    Events since last clinic visit: Patient presents today for a follow up visit.  Down to 0.75ppd, on the nicotine patch, currently 23mg Currently on symbicort 1 puff bid Accompanied by husband today Still with morning cough - mild productive, chronic  No recent ER or urgent care visit.   Current Outpatient Prescriptions:  .  albuterol (PROVENTIL) (2.5 MG/3ML) 0.083% nebulizer solution, Take 3 mLs (2.5 mg total) by nebulization every 6 (six) hours as needed for wheezing or shortness of breath., Disp: 150 mL, Rfl: 1 .  aspirin EC 81 MG tablet, Take 1 tablet (81 mg total) by mouth daily., Disp: 90 tablet, Rfl: 3 .  atorvastatin (LIPITOR) 20 MG tablet, Take 1 tablet (20 mg total) by mouth daily., Disp: 30 tablet, Rfl: 11 .  budesonide-formoterol (SYMBICORT) 160-4.5 MCG/ACT inhaler, Inhale 2 puffs into the lungs 2 (two) times daily., Disp: 3 Inhaler, Rfl: 3 .  diclofenac (VOLTAREN) 75 MG EC tablet, TAKE 1 TABLET (75 MG TOTAL) BY MOUTH 2 (TWO) TIMES DAILY AS NEEDED., Disp: 60 tablet, Rfl: 2 .  fesoterodine (TOVIAZ) 8 MG TB24 tablet, Take 1 tablet (8 mg total) by mouth daily. (Patient taking differently: Take 8 mg by mouth 2 (two) times daily. ), Disp: 30 tablet, Rfl: 11 .  lisinopril (PRINIVIL,ZESTRIL) 30 MG tablet, Take 1 tablet (30 mg total) by mouth daily., Disp: 90 tablet, Rfl: 1 .  Nicotine 21-14-7 MG/24HR KIT, Use as directed, Disp: 56 each, Rfl: 0 .  pantoprazole (PROTONIX) 40 MG tablet, Take 1 tablet (40 mg total) by mouth every morning., Disp: 90 tablet, Rfl: 1 .  tiZANidine (ZANAFLEX) 4 MG capsule, Take 4 mg by mouth 3 (three) times  daily as needed for muscle spasms. Reported on 07/11/2015, Disp: , Rfl:    Review of Systems  Constitutional: Negative for chills and fever.  Eyes: Negative for blurred vision.  Respiratory: Positive for cough and shortness of breath. Negative for sputum production and wheezing.   Gastrointestinal: Negative for diarrhea, heartburn, nausea and vomiting.  Skin: Negative for rash.  Neurological: Negative for dizziness.  Endo/Heme/Allergies: Does not bruise/bleed easily.      Allergies:  Anoro ellipta [umeclidinium-vilanterol] and Biaxin [clarithromycin]  Physical Examination:  VS: BP (!) 136/96 (BP Location: Left Arm, Cuff Size: Normal)   Pulse 71   Ht 5' 2"  (1.575 m)   Wt 191 lb (86.6 kg)   SpO2 98%   BMI 34.93 kg/m   General Appearance: No distress  HEENT: PERRLA, no ptosis, no other lesions noticed Pulmonary:normal breath sounds., diaphragmatic excursion normal.No wheezing, No rales   Cardiovascular:  Normal S1,S2.  No m/r/g.     Abdomen:Exam: Benign, Soft, non-tender, No masses  Skin:   warm, no rashes, no ecchymosis  Extremities: normal, no cyanosis, clubbing, warm with normal capillary refill.      Assessment and Plan:55 yo with hx of recurrent URIs and AECOPD, now stable, seen for follow up visit. Cigarette smoker Tobacco Cessation - Counseling regarding benefits of smoking cessation strategies was provided for more than 3 min. - Educated that at this time smoking- cessation represents the single most  important step that patient can take to enhance the length and quality of live. - Educated patient regarding alternatives of behavior interventions, pharmacotherapy including NRT and non-nicotine therapy such, and combinations of both. - Patient at this time try to quit on her own, current on nicotine patches, down to 0.75ppd   COPD (chronic obstructive pulmonary disease) Patient with known history of COPD. She appeared to strive multiple inhalers in the past, the ones  with an inhaled corticosteroid does cause thrush and mouth sores. Currently is using Symbicort 160/4.5 one puff twice a day for the past month with no significant oral issues.  Her tobacco use is significant, and tobacco avoidance and eventually quitting is paramount to his overall improvement in her respiratory status. This was explained to her and her husband today  Start walking, 66mns/day at least 3 times per week Currently on nicotine patch  Inoffice spiro, FEV1:88% 6MWT - 1102 ft.   Plan: -Continue with Symbicort 1 puff twice a day, gargle and rinse after each use -Tobacco avoidance    Updated Medication List Outpatient Encounter Prescriptions as of 02/20/2016  Medication Sig  . albuterol (PROVENTIL) (2.5 MG/3ML) 0.083% nebulizer solution Take 3 mLs (2.5 mg total) by nebulization every 6 (six) hours as needed for wheezing or shortness of breath.  .Marland Kitchenaspirin EC 81 MG tablet Take 1 tablet (81 mg total) by mouth daily.  .Marland Kitchenatorvastatin (LIPITOR) 20 MG tablet Take 1 tablet (20 mg total) by mouth daily.  . budesonide-formoterol (SYMBICORT) 160-4.5 MCG/ACT inhaler Inhale 2 puffs into the lungs 2 (two) times daily.  . diclofenac (VOLTAREN) 75 MG EC tablet TAKE 1 TABLET (75 MG TOTAL) BY MOUTH 2 (TWO) TIMES DAILY AS NEEDED.  . fesoterodine (TOVIAZ) 8 MG TB24 tablet Take 1 tablet (8 mg total) by mouth daily. (Patient taking differently: Take 8 mg by mouth 2 (two) times daily. )  . lisinopril (PRINIVIL,ZESTRIL) 30 MG tablet Take 1 tablet (30 mg total) by mouth daily.  . Nicotine 21-14-7 MG/24HR KIT Use as directed  . pantoprazole (PROTONIX) 40 MG tablet Take 1 tablet (40 mg total) by mouth every morning.  .Marland KitchentiZANidine (ZANAFLEX) 4 MG capsule Take 4 mg by mouth 3 (three) times daily as needed for muscle spasms. Reported on 07/11/2015   No facility-administered encounter medications on file as of 02/20/2016.     Orders for this visit: No orders of the defined types were placed in this  encounter.   Thank  you for the visitation and for allowing  [facility] to assist in the care of your patient. Our recommendations are noted above.  Please contact [place] we can be of further service.  VVilinda Boehringer MD [facility] Office Number: [phone] Note: This note was prepared with Dragon dictation along with smaller phrase technology. Any transcriptional errors that result from this process are unintentional.

## 2016-02-20 NOTE — Assessment & Plan Note (Signed)
Patient with known history of COPD. She appeared to strive multiple inhalers in the past, the ones with an inhaled corticosteroid does cause thrush and mouth sores. Currently is using Symbicort 160/4.5 one puff twice a day for the past month with no significant oral issues.  Her tobacco use is significant, and tobacco avoidance and eventually quitting is paramount to his overall improvement in her respiratory status. This was explained to her and her husband today  Start walking, 8030mins/day at least 3 times per week Currently on nicotine patch  Inoffice spiro, FEV1:88% 6MWT - 1102 ft.   Plan: -Continue with Symbicort 1 puff twice a day, gargle and rinse after each use -Tobacco avoidance

## 2016-02-20 NOTE — Assessment & Plan Note (Signed)
Tobacco Cessation - Counseling regarding benefits of smoking cessation strategies was provided for more than 3 min. - Educated that at this time smoking- cessation represents the single most important step that patient can take to enhance the length and quality of live. - Educated patient regarding alternatives of behavior interventions, pharmacotherapy including NRT and non-nicotine therapy such, and combinations of both. - Patient at this time try to quit on her own, current on nicotine patches, down to 0.75ppd

## 2016-02-20 NOTE — Patient Instructions (Signed)
Follow up with Dr. Dema SeverinMungal in:6 months - cont with symbicort - Start walking, 4830mins/day at least 3 times per week - cont with tobacco reduction, along with your current nicotine patches, and eventually stop smoking before the end of the year.

## 2016-02-24 ENCOUNTER — Ambulatory Visit: Payer: 59 | Admitting: Family Medicine

## 2016-02-29 ENCOUNTER — Other Ambulatory Visit: Payer: Self-pay | Admitting: Family Medicine

## 2016-02-29 ENCOUNTER — Encounter: Payer: Self-pay | Admitting: Family Medicine

## 2016-02-29 ENCOUNTER — Ambulatory Visit (INDEPENDENT_AMBULATORY_CARE_PROVIDER_SITE_OTHER): Payer: 59 | Admitting: Family Medicine

## 2016-02-29 VITALS — BP 131/91 | HR 75 | Temp 97.7°F | Ht 62.0 in | Wt 189.0 lb

## 2016-02-29 DIAGNOSIS — E119 Type 2 diabetes mellitus without complications: Secondary | ICD-10-CM

## 2016-02-29 DIAGNOSIS — Z23 Encounter for immunization: Secondary | ICD-10-CM | POA: Diagnosis not present

## 2016-02-29 DIAGNOSIS — E041 Nontoxic single thyroid nodule: Secondary | ICD-10-CM

## 2016-02-29 LAB — BAYER DCA HB A1C WAIVED: HB A1C (BAYER DCA - WAIVED): 6.7 % (ref <7.0)

## 2016-02-29 LAB — HEMOGLOBIN A1C: HEMOGLOBIN A1C: 6.7

## 2016-02-29 NOTE — Assessment & Plan Note (Signed)
US for thyroid nodule ordered today. Patient will get. Treat as needed.

## 2016-02-29 NOTE — Patient Instructions (Addendum)

## 2016-02-29 NOTE — Progress Notes (Signed)
BP (!) 148/89 (BP Location: Left Arm, Patient Position: Sitting, Cuff Size: Normal)   Pulse 75   Temp 97.7 F (36.5 C)   Ht 5\' 2"  (1.575 m)   Wt 189 lb (85.7 kg)   SpO2 97%   BMI 34.57 kg/m    Subjective:    Patient ID: Ok Monica Hull, female    DOB: 1961/03/18, 55 y.o.   MRN: [id number]  HPI: Monica Hull is a 55 y.o. female  Chief Complaint  Patient presents with  . Diabetes   DIABETES Hypoglycemic episodes:no Polydipsia/polyuria: no Visual disturbance: no Chest pain: no Paresthesias: no Glucose Monitoring: yes  Accucheck frequency: Daily Blood Pressure Monitoring: not checking Retinal Examination: Up to Date Foot Exam: Up to Date Diabetic Education: Completed Pneumovax: Up to Date Influenza: Up to Date Aspirin: no  Relevant past medical, surgical, family and social history reviewed and updated as indicated. Interim medical history since our last visit reviewed. Allergies and medications reviewed and updated.  Review of Systems  Constitutional: Negative.   Respiratory: Negative.   Cardiovascular: Negative.   Psychiatric/Behavioral: Negative.     Per HPI unless specifically indicated above     Objective:    BP (!) 148/89 (BP Location: Left Arm, Patient Position: Sitting, Cuff Size: Normal)   Pulse 75   Temp 97.7 F (36.5 C)   Ht 5\' 2"  (1.575 m)   Wt 189 lb (85.7 kg)   SpO2 97%   BMI 34.57 kg/m   Wt Readings from Last 3 Encounters:  02/29/16 189 lb (85.7 kg)  02/20/16 191 lb (86.6 kg)  02/16/16 191 lb 8 oz (86.9 kg)    Physical Exam  Constitutional: She is oriented to person, place, and time. She appears well-developed and well-nourished. No distress.  HENT:  Head: Normocephalic and atraumatic.  Right Ear: Hearing normal.  Left Ear: Hearing normal.  Nose: Nose normal.  Eyes: Conjunctivae and lids are normal. Right eye exhibits no discharge. Left eye exhibits no discharge. No scleral icterus.  Cardiovascular: Normal rate, regular  rhythm, normal heart sounds and intact distal pulses.  Exam reveals no gallop and no friction rub.   No murmur heard. Pulmonary/Chest: Effort normal and breath sounds normal. No respiratory distress. She has no wheezes. She has no rales. She exhibits no tenderness.  Musculoskeletal: Normal range of motion.  Neurological: She is alert and oriented to person, place, and time.  Skin: Skin is warm, dry and intact. No rash noted. No erythema. No pallor.  Psychiatric: She has a normal mood and affect. Her speech is normal and behavior is normal. Judgment and thought content normal. Cognition and memory are normal.  Nursing note and vitals reviewed.   Results for orders placed or performed in visit on 02/29/16  Hemoglobin A1c  Result Value Ref Range   Hemoglobin A1C 6.7       Assessment & Plan:   Problem List Items Addressed This Visit      Endocrine   Thyroid nodule    [place] for thyroid nodule ordered today. Patient will get. Treat as needed.       Relevant Orders   US Soft Tissue Head/Neck     Other   Diet-controlled diabetes mellitus (HCC) - Primary    Stable. Continue diet and exercise. Recheck 3 months.        Other Visit Diagnoses    Immunization due       Flu shot given today.   Relevant Orders   Flu Vaccine QUAD  36+ mos PF IM (Fluarix & Fluzone Quad PF) (Completed)       Follow up plan: Return in about 3 months (around 05/30/2016) for DM visit.

## 2016-02-29 NOTE — Progress Notes (Signed)
   There were no vitals taken for this visit.   Subjective:    Patient ID: Monica Hull, female    DOB: 1960/09/14, 55 y.o.   MRN: [id number]  HPI: Monica Hull is a 55 y.o. female  No chief complaint on file.   Relevant past medical, surgical, family and social history reviewed and updated as indicated. Interim medical history since our last visit reviewed. Allergies and medications reviewed and updated.  Review of Systems  Per HPI unless specifically indicated above     Objective:    There were no vitals taken for this visit.  Wt Readings from Last 3 Encounters:  02/20/16 191 lb (86.6 kg)  02/16/16 191 lb 8 oz (86.9 kg)  01/10/16 190 lb 12.8 oz (86.5 kg)    Physical Exam  Results for orders placed or performed in visit on 02/16/16  Pulmonary function test  Result Value Ref Range   FVC-Pre 2.59 L   FVC-%Pred-Pre 80 %   FVC-Post 2.54 L   FVC-%Pred-Post 78 %   FVC-%Change-Post -1 %   FEV1-Pre 1.39 L   FEV1-%Pred-Pre 55 %   FEV1-Post 1.58 L   FEV1-%Pred-Post 62 %   FEV1-%Change-Post 13 %   FEV6-Pre 2.57 L   FEV6-%Pred-Pre 82 %   FEV6-Post 2.49 L   FEV6-%Pred-Post 79 %   FEV6-%Change-Post -3 %   Pre FEV1/FVC ratio 54 %   FEV1FVC-%Pred-Pre 68 %   Post FEV1/FVC ratio 62 %   FEV1FVC-%Change-Post 15 %   Pre FEV6/FVC Ratio 99 %   FEV6FVC-%Pred-Pre 102 %   Post FEV6/FVC ratio 100 %   FEV6FVC-%Pred-Post 103 %   FEV6FVC-%Change-Post 0 %   FEF 25-75 Pre 0.61 L/sec   FEF2575-%Pred-Pre 24 %   FEF 25-75 Post 0.91 L/sec   FEF2575-%Pred-Post 37 %   FEF2575-%Change-Post 50 %   DLCO unc 21.14 ml/min/mmHg   DLCO unc % pred 95 %   DL/VA 4.094.89 ml/min/mmHg/L   DL/VA % pred 811106 %      Assessment & Plan:   Problem List Items Addressed This Visit      Other   Diet-controlled diabetes mellitus (HCC) - Primary    Other Visit Diagnoses   None.      Follow up plan: No Follow-up on file.

## 2016-02-29 NOTE — Assessment & Plan Note (Signed)
Stable. Continue diet and exercise. Recheck 3 months.

## 2016-03-02 ENCOUNTER — Ambulatory Visit
Admission: RE | Admit: 2016-03-02 | Discharge: 2016-03-02 | Disposition: A | Discharge status: Home / Self Care | Payer: 59 | Source: Ambulatory Visit | Attending: Family Medicine | Admitting: Family Medicine

## 2016-03-02 DIAGNOSIS — E042 Nontoxic multinodular goiter: Secondary | ICD-10-CM | POA: Insufficient documentation

## 2016-03-02 DIAGNOSIS — E041 Nontoxic single thyroid nodule: Secondary | ICD-10-CM

## 2016-03-06 ENCOUNTER — Telehealth: Payer: Self-pay | Admitting: Family Medicine

## 2016-03-06 ENCOUNTER — Encounter: Payer: Self-pay | Admitting: Family Medicine

## 2016-03-06 DIAGNOSIS — E041 Nontoxic single thyroid nodule: Secondary | ICD-10-CM

## 2016-03-06 NOTE — Telephone Encounter (Signed)
Called to discuss results with patient. Not due for repeat US for 1 year. Notes made in the computer.

## 2016-03-15 ENCOUNTER — Telehealth: Payer: Self-pay | Admitting: Family Medicine

## 2016-03-15 MED ORDER — BENZONATATE 200 MG PO CAPS: 200.0000 mg | ORAL_CAPSULE | Freq: Two times a day (BID) | ORAL | 0 refills | Status: DC | PRN

## 2016-03-15 NOTE — Telephone Encounter (Signed)
Pt would like to have [facility] [place].

## 2016-05-30 ENCOUNTER — Ambulatory Visit (INDEPENDENT_AMBULATORY_CARE_PROVIDER_SITE_OTHER): Payer: 59 | Admitting: Family Medicine

## 2016-05-30 ENCOUNTER — Encounter: Payer: Self-pay | Admitting: Family Medicine

## 2016-05-30 VITALS — BP 135/86 | HR 71 | Temp 97.8°F | Wt 192.3 lb

## 2016-05-30 DIAGNOSIS — E119 Type 2 diabetes mellitus without complications: Secondary | ICD-10-CM

## 2016-05-30 DIAGNOSIS — J441 Chronic obstructive pulmonary disease with (acute) exacerbation: Secondary | ICD-10-CM | POA: Diagnosis not present

## 2016-05-30 LAB — BAYER DCA HB A1C WAIVED: HB A1C (BAYER DCA - WAIVED): 6.8 % (ref <7.0)

## 2016-05-30 MED ORDER — LISINOPRIL 30 MG PO TABS: 30.0000 mg | ORAL_TABLET | Freq: Every day | ORAL | 1 refills | Status: DC

## 2016-05-30 MED ORDER — DICLOFENAC SODIUM 75 MG PO TBEC: 75.0000 mg | DELAYED_RELEASE_TABLET | Freq: Two times a day (BID) | ORAL | 2 refills | Status: DC | PRN

## 2016-05-30 MED ORDER — BUDESONIDE-FORMOTEROL FUMARATE 160-4.5 MCG/ACT IN AERO: 2.0000 | INHALATION_SPRAY | Freq: Two times a day (BID) | RESPIRATORY_TRACT | 3 refills | Status: DC

## 2016-05-30 MED ORDER — ALBUTEROL SULFATE (2.5 MG/3ML) 0.083% IN NEBU: 2.5000 mg | INHALATION_SOLUTION | Freq: Four times a day (QID) | RESPIRATORY_TRACT | 1 refills | Status: DC | PRN

## 2016-05-30 MED ORDER — PREDNISONE 50 MG PO TABS: 50.0000 mg | ORAL_TABLET | Freq: Every day | ORAL | 0 refills | Status: DC

## 2016-05-30 MED ORDER — PANTOPRAZOLE SODIUM 40 MG PO TBEC: 40.0000 mg | DELAYED_RELEASE_TABLET | ORAL | 1 refills | Status: DC

## 2016-05-30 NOTE — Progress Notes (Signed)
BP 135/86 (BP Location: Left Arm, Patient Position: Sitting, Cuff Size: Large)   Pulse 71   Temp 97.8 F (36.6 C)   Wt 192 lb 4.8 oz (87.2 kg)   SpO2 97%   BMI 35.17 kg/m    Subjective:    Patient ID: Monica Hull, female    DOB: 10-09-1960, 55 y.o.   MRN: [id number]  HPI: Monica Hull is a 55 y.o. female  Chief Complaint  Patient presents with  . Diabetes   UPPER RESPIRATORY TRACT INFECTION Duration: 4-5 days  Worst symptom: sinus pressure Fever: no Cough: yes Shortness of breath: yes Wheezing: yes Chest pain: yes, with cough Chest tightness: yes Chest congestion: yes Nasal congestion: yes Runny nose: yes Post nasal drip: no Sneezing: no Sore throat: no Swollen glands: no Sinus pressure: yes Headache: yes Face pain: no Toothache: no Ear pain: no  Ear pressure: yes "right Eyes red/itching:no Eye drainage/crusting: no  Vomiting: no Rash: no Fatigue: yes Sick contacts: yes Strep contacts: no  Context: stable Recurrent sinusitis: no Relief with OTC cold/cough medications: no  Treatments attempted: none   DIABETES Hypoglycemic episodes:no Polydipsia/polyuria: yes Visual disturbance: no Chest pain: no Paresthesias: no Glucose Monitoring: no Taking Insulin?: no Blood Pressure Monitoring: not checking Retinal Examination: Not up to Date Foot Exam: Up to Date Diabetic Education: Completed Pneumovax: Up to Date Influenza: Up to Date Aspirin: yes  Relevant past medical, surgical, family and social history reviewed and updated as indicated. Interim medical history since our last visit reviewed. Allergies and medications reviewed and updated.  Review of Systems  Constitutional: Positive for fatigue. Negative for appetite change, chills, diaphoresis, fever and unexpected weight change.  HENT: Positive for congestion, postnasal drip, rhinorrhea and sinus pressure. Negative for dental problem, drooling, ear discharge, ear pain, facial  swelling, hearing loss, mouth sores, nosebleeds, sinus pain, sneezing, sore throat, tinnitus, trouble swallowing and voice change.   Eyes: Negative.   Respiratory: Positive for cough, chest tightness, shortness of breath and wheezing. Negative for apnea, choking and stridor.   Cardiovascular: Negative.   Psychiatric/Behavioral: Negative.     Per HPI unless specifically indicated above     Objective:    BP 135/86 (BP Location: Left Arm, Patient Position: Sitting, Cuff Size: Large)   Pulse 71   Temp 97.8 F (36.6 C)   Wt 192 lb 4.8 oz (87.2 kg)   SpO2 97%   BMI 35.17 kg/m   Wt Readings from Last 3 Encounters:  05/30/16 192 lb 4.8 oz (87.2 kg)  02/29/16 189 lb (85.7 kg)  02/20/16 191 lb (86.6 kg)    Physical Exam  Constitutional: She is oriented to person, place, and time. She appears well-developed and well-nourished. No distress.  HENT:  Head: Normocephalic and atraumatic.  Right Ear: Hearing, tympanic membrane, external ear and ear canal normal.  Left Ear: Hearing, tympanic membrane, external ear and ear canal normal.  Nose: Mucosal edema and rhinorrhea present.  Mouth/Throat: Uvula is midline, oropharynx is clear and moist and mucous membranes are normal. No oropharyngeal exudate.  Eyes: Conjunctivae, EOM and lids are normal. Pupils are equal, round, and reactive to light. Right eye exhibits no discharge. Left eye exhibits no discharge. No scleral icterus.  Neck: Normal range of motion. Neck supple. No JVD present. No tracheal deviation present. No thyromegaly present.  Cardiovascular: Normal rate, regular rhythm, normal heart sounds and intact distal pulses.  Exam reveals no gallop and no friction rub.   No murmur heard. Pulmonary/Chest: Effort  normal. No stridor. No respiratory distress. She has wheezes in the right upper field, the right middle field and the right lower field. She has no rales. She exhibits no tenderness.  Musculoskeletal: Normal range of motion.    Lymphadenopathy:    She has no cervical adenopathy.  Neurological: She is alert and oriented to person, place, and time.  Skin: Skin is warm, dry and intact. No rash noted. She is not diaphoretic. No erythema. No pallor.  Psychiatric: She has a normal mood and affect. Her speech is normal and behavior is normal. Judgment and thought content normal. Cognition and memory are normal.  Nursing note and vitals reviewed.   Results for orders placed or performed in visit on 02/29/16  Bayer DCA Hb A1c Waived  Result Value Ref Range   Bayer DCA Hb A1c Waived 6.7 <7.0 %  Hemoglobin A1c  Result Value Ref Range   Hemoglobin A1C 6.7       Assessment & Plan:   Problem List Items Addressed This Visit      Other   Diet-controlled diabetes mellitus (HCC) - Primary   Relevant Medications   lisinopril (PRINIVIL,ZESTRIL) 30 MG tablet   Other Relevant Orders   Bayer DCA Hb A1c Waived    Other Visit Diagnoses    COPD with acute exacerbation (HCC)       Relevant Medications   predniSONE (DELTASONE) 50 MG tablet   budesonide-formoterol (SYMBICORT) 160-4.5 MCG/ACT inhaler   albuterol (PROVENTIL) (2.5 MG/3ML) 0.083% nebulizer solution       Follow up plan: No Follow-up on file.

## 2016-05-30 NOTE — Assessment & Plan Note (Signed)
A1c stable at 6.8. Continue diet and exercise. Continue to monitor. Call with any concerns.

## 2016-06-05 ENCOUNTER — Telehealth: Payer: Self-pay | Admitting: Family Medicine

## 2016-06-05 NOTE — Telephone Encounter (Signed)
Mr Monica Hull called and stated that the pt now has an ear ache and would like to know what she should do.

## 2016-06-05 NOTE — Telephone Encounter (Signed)
She should take some OTC decongestant. If she starts feeling worse, have her come in so we cam make sure she doesn't have an infection.

## 2016-06-05 NOTE — Telephone Encounter (Signed)
Patient's husband notified  

## 2016-08-02 ENCOUNTER — Ambulatory Visit (INDEPENDENT_AMBULATORY_CARE_PROVIDER_SITE_OTHER): Payer: 59 | Admitting: Family Medicine

## 2016-08-02 ENCOUNTER — Encounter: Payer: Self-pay | Admitting: Family Medicine

## 2016-08-02 VITALS — BP 136/88 | HR 73 | Temp 98.3°F | Wt 194.0 lb

## 2016-08-02 DIAGNOSIS — J069 Acute upper respiratory infection, unspecified: Secondary | ICD-10-CM

## 2016-08-02 MED ORDER — AZITHROMYCIN 250 MG PO TABS: ORAL_TABLET | ORAL | 0 refills | Status: DC

## 2016-08-02 MED ORDER — HYDROCOD POLST-CPM POLST ER 10-8 MG/5ML PO SUER: 5.0000 mL | Freq: Two times a day (BID) | ORAL | 0 refills | Status: DC | PRN

## 2016-08-02 MED ORDER — PREDNISONE 10 MG PO TABS: ORAL_TABLET | ORAL | 0 refills | Status: DC

## 2016-08-02 MED ORDER — BENZONATATE 200 MG PO CAPS: 200.0000 mg | ORAL_CAPSULE | Freq: Two times a day (BID) | ORAL | 0 refills | Status: DC | PRN

## 2016-08-02 MED ORDER — ALBUTEROL SULFATE HFA 108 (90 BASE) MCG/ACT IN AERS: 2.0000 | INHALATION_SPRAY | Freq: Four times a day (QID) | RESPIRATORY_TRACT | 11 refills | Status: DC | PRN

## 2016-08-02 NOTE — Progress Notes (Signed)
   BP 136/88   Pulse 73   Temp 98.3 F (36.8 C)   Wt 194 lb (88 kg)   SpO2 96%   BMI 35.48 kg/m    Subjective:    Patient ID: Ok Anisynthia K Gasiorowski, female    DOB: Nov 04, 1960, 56 y.o.   MRN: [id number]  HPI: Ok AnisCynthia K Norgard is a 56 y.o. female  Chief Complaint  Patient presents with  . URI    wheezing x 1 week, sore throat x 1 day, right ear pressure, non productive cough, nasal congestion. No much chest congestion. No fever, no body aches.    Patient presents with 1 week of wheezing, sore throat, right ear pressure, cough, congestion. Denies CP, SOB, body aches, fever. Not currently taking anything OTC. Several sick contacts.   Relevant past medical, surgical, family and social history reviewed and updated as indicated. Interim medical history since our last visit reviewed. Allergies and medications reviewed and updated.  Review of Systems  HENT: Positive for congestion and ear pain.   Respiratory: Positive for cough and wheezing.   Cardiovascular: Negative.   Genitourinary: Negative.   Musculoskeletal: Negative.   Neurological: Negative.   Psychiatric/Behavioral: Negative.     Per HPI unless specifically indicated above     Objective:    BP 136/88   Pulse 73   Temp 98.3 F (36.8 C)   Wt 194 lb (88 kg)   SpO2 96%   BMI 35.48 kg/m   Wt Readings from Last 3 Encounters:  08/02/16 194 lb (88 kg)  05/30/16 192 lb 4.8 oz (87.2 kg)  02/29/16 189 lb (85.7 kg)    Physical Exam  Constitutional: She is oriented to person, place, and time. She appears well-developed and well-nourished.  Neck: Normal range of motion. Neck supple.  Cardiovascular: Normal rate and normal heart sounds.   Pulmonary/Chest: Effort normal. No respiratory distress. She has wheezes (mild wheezes).  Musculoskeletal: Normal range of motion.  Neurological: She is alert and oriented to person, place, and time.  Skin: Skin is warm and dry.  Psychiatric: She has a normal mood and affect. Her  behavior is normal.  Nursing note and vitals reviewed.     Assessment & Plan:   Problem List Items Addressed This Visit    None    Visit Diagnoses    Upper respiratory tract infection, unspecified type    -  Primary   Tessalon, tussionex, azithromycin, and albuterol sent. Precautions discussed. Supportive care. Follow up as needed   Relevant Medications   azithromycin (ZITHROMAX) 250 MG tablet       Follow up plan: Return if symptoms worsen or fail to improve.

## 2016-08-06 NOTE — Patient Instructions (Signed)
Follow up as needed

## 2016-08-09 ENCOUNTER — Telehealth: Payer: Self-pay

## 2016-08-09 DIAGNOSIS — R0602 Shortness of breath: Secondary | ICD-10-CM

## 2016-08-09 NOTE — Telephone Encounter (Signed)
She was seen on 08/02/16. She has finished her Zpack and Prednisone. This morning at 5am she woke up and couldn't breathe good. She said her upper back hurts all the way across, still has a non productive cough, and says it almost feels like her lungs are swollen or there is fluid in there.

## 2016-08-09 NOTE — Telephone Encounter (Signed)
Chest x-ray ordered, [facility] done and we will be in touch with the results.

## 2016-08-09 NOTE — Telephone Encounter (Signed)
Patient notified, she will get chest xray in the morning. Advised her that if her breathing gets worse over night, to go to the ED. Patient understood and agreed.

## 2016-08-10 ENCOUNTER — Telehealth: Payer: Self-pay | Admitting: Family Medicine

## 2016-08-10 ENCOUNTER — Ambulatory Visit
Admission: RE | Admit: 2016-08-10 | Discharge: 2016-08-10 | Disposition: A | Discharge status: Home / Self Care | Payer: 59 | Source: Ambulatory Visit | Attending: Family Medicine | Admitting: Family Medicine

## 2016-08-10 DIAGNOSIS — R0602 Shortness of breath: Secondary | ICD-10-CM | POA: Insufficient documentation

## 2016-08-10 MED ORDER — PREDNISONE 10 MG PO TABS: ORAL_TABLET | ORAL | 0 refills | Status: DC

## 2016-08-10 NOTE — Telephone Encounter (Signed)
Message relayed to patient. Verbalized understanding and denied questions.   

## 2016-08-10 NOTE — Telephone Encounter (Signed)
Please call pt and let her know that her chest x-ray came back normal, no sign of pneumonia. I will send in another prednisone taper and if she is still not improving let's have her come back in at that point for another look. As always, if severely worsening go straight to ER.

## 2016-08-14 ENCOUNTER — Encounter: Payer: Self-pay | Admitting: Pulmonary Disease

## 2016-08-14 ENCOUNTER — Ambulatory Visit (INDEPENDENT_AMBULATORY_CARE_PROVIDER_SITE_OTHER): Payer: 59 | Admitting: Pulmonary Disease

## 2016-08-14 VITALS — BP 142/88 | HR 91 | Wt 193.0 lb

## 2016-08-14 DIAGNOSIS — J449 Chronic obstructive pulmonary disease, unspecified: Secondary | ICD-10-CM

## 2016-08-14 DIAGNOSIS — J441 Chronic obstructive pulmonary disease with (acute) exacerbation: Secondary | ICD-10-CM

## 2016-08-14 DIAGNOSIS — F17200 Nicotine dependence, unspecified, uncomplicated: Secondary | ICD-10-CM | POA: Diagnosis not present

## 2016-08-14 MED ORDER — VARENICLINE TARTRATE 1 MG PO TABS: 1.0000 mg | ORAL_TABLET | Freq: Two times a day (BID) | ORAL | 10 refills | Status: DC

## 2016-08-14 MED ORDER — VARENICLINE TARTRATE 0.5 MG X 11 & 1 MG X 42 PO MISC
ORAL | 0 refills | Status: DC
Start: 2016-08-14 — End: 2016-09-06

## 2016-08-14 NOTE — Patient Instructions (Addendum)
1) Start the prednisone as prescribed by Dr Lamar SprinklesLang 2) Continue Symbicort and albuterol as needed 3) We discussed smoking cessation in detail 4) Begin Chantix - prescriptions sent to pharmacy 5) Follow up in 8 weeks

## 2016-08-16 NOTE — Progress Notes (Signed)
PULMONARY OFFICE FOLLOW UP NOTE  PROBLEMS:  Moderate COPD Smoker  DATA: PFTs 02/16/16: Mild obstruction (FEV1 1.39 L, 55% pred > 1.58 L 62% pred), mild restriction, normal DLCO CXR 08/10/16: chronic changes, no acute findings  INTERVAL HISTORY: Previously a pt of [facility]. Last seen 02/20/16. No major events  SUBJ: This is a routine follow up. She continues to smoke 1.5-2 PPD. Her mother has recently been diagnosed with pancreatic cancer which has increased her stress and consequently her smoking. She continues to have class III DOE and chronic cough with sputum production. Denies CP, fever, purulent sputum, hemoptysis, LE edema and calf tenderness.  OBJ: Vitals:   08/14/16 0955  BP: (!) 142/88  Pulse: 91  SpO2: 98%  Weight: 193 lb (87.5 kg)  RA  Appears older than chronological age HEENT: poor dentition, otherwise NAD No JVD or LAN  Diffuse coarse wheezes Reg, no M NABS No edema   DATA: CXR as noted a bove  IMPRESSION: 1) Moderate COPD with chronic bronchitis 2) Recalcitrant smoker 3) Acute bronchospasm  She has a prescription for prednisone which has not yet been filled  PLAN: 1) We spoke at length and in great detail regarding smoking cessation. I explained that we will always be dissatisfied with the response to efforts at medical therapy if she continues to smoke. We discussed the risks associated with continued smoking, the benefits of smoking cessation and strategies that might be helpful. After thorough consideration, we have decided on Varenicline (Chantix) - Rx ordered for starter and continuation packs 2) Fill Rx for prednisone as prescribed by Dr Lamar SprinklesLang 3) Continue Symbicort and PRN albuterol 4) Follow up in 8 weeks   Billy Fischeravid Simonds, MD PCCM service Mobile [phone] Pager [phone] 08/16/2016

## 2016-08-24 ENCOUNTER — Telehealth: Payer: Self-pay | Admitting: *Deleted

## 2016-08-24 ENCOUNTER — Telehealth: Payer: Self-pay | Admitting: Family Medicine

## 2016-08-24 ENCOUNTER — Encounter: Payer: Self-pay | Admitting: Family Medicine

## 2016-08-24 ENCOUNTER — Ambulatory Visit (INDEPENDENT_AMBULATORY_CARE_PROVIDER_SITE_OTHER): Payer: 59 | Admitting: Family Medicine

## 2016-08-24 VITALS — BP 148/94 | HR 73 | Temp 98.5°F | Resp 17 | Ht 62.0 in | Wt 193.0 lb

## 2016-08-24 DIAGNOSIS — J441 Chronic obstructive pulmonary disease with (acute) exacerbation: Secondary | ICD-10-CM

## 2016-08-24 MED ORDER — ALBUTEROL SULFATE (2.5 MG/3ML) 0.083% IN NEBU
2.5000 mg | INHALATION_SOLUTION | Freq: Once | RESPIRATORY_TRACT | Status: AC
  Administered 2016-08-24: 2.5 mg via RESPIRATORY_TRACT

## 2016-08-24 MED ORDER — PREDNISONE 10 MG PO TABS: ORAL_TABLET | ORAL | 0 refills | Status: DC

## 2016-08-24 MED ORDER — LEVOFLOXACIN 500 MG PO TABS
500.0000 mg | ORAL_TABLET | Freq: Every day | ORAL | 0 refills | Status: DC
Start: 2016-08-24 — End: 2016-09-06

## 2016-08-24 NOTE — Telephone Encounter (Signed)
Patient scheduled for 2:45 today per Dr. Laural BenesJohnson

## 2016-08-24 NOTE — Telephone Encounter (Signed)
Submitted PA for Chantix thru [facility]. KeyMarland Kitchen: [id number]  submitted for review. Will await response.

## 2016-08-24 NOTE — Progress Notes (Signed)
BP (!) 148/94 (BP Location: Left Arm, Patient Position: Sitting, Cuff Size: Normal)   Pulse 73   Temp 98.5 F (36.9 C) (Oral)   Resp 17   Ht 5\' 2"  (1.575 m)   Wt 193 lb (87.5 kg)   SpO2 95%   BMI 35.30 kg/m    Subjective:    Patient ID: Monica Hull, female    DOB: [birth year], 56 y.o.   MRN: [id number]  HPI: Monica CoddingCynthia Horton Skelly is a 56 y.o. female  Chief Complaint  Patient presents with  . Cough   UPPER RESPIRATORY TRACT INFECTION Duration: 1 week Worst symptom: cough Fever: no Cough: yes Shortness of breath: yes Wheezing: yes Chest pain: yes, with cough Chest tightness: yes Chest congestion: yes Nasal congestion: no Runny nose: no Post nasal drip: no Sneezing: no Sore throat: no Swollen glands: no Sinus pressure: no Headache: no Face pain: no Toothache: no Ear pain: no  Ear pressure: no  Eyes red/itching:no Eye drainage/crusting: no  Vomiting: no Rash: no Fatigue: yes Sick contacts: yes Strep contacts: no  Context: worse Recurrent sinusitis: no Relief with OTC cold/cough medications: no  Treatments attempted: z-pack and steroid   Relevant past medical, surgical, family and social history reviewed and updated as indicated. Interim medical history since our last visit reviewed. Allergies and medications reviewed and updated.  Review of Systems  Constitutional: Negative.   Respiratory: Positive for cough, chest tightness, shortness of breath and wheezing. Negative for apnea, choking and stridor.   Cardiovascular: Negative.   Psychiatric/Behavioral: Negative.     Per HPI unless specifically indicated above     Objective:    BP (!) 148/94 (BP Location: Left Arm, Patient Position: Sitting, Cuff Size: Normal)   Pulse 73   Temp 98.5 F (36.9 C) (Oral)   Resp 17   Ht 5\' 2"  (1.575 m)   Wt 193 lb (87.5 kg)   SpO2 95%   BMI 35.30 kg/m   Wt Readings from Last 3 Encounters:  08/24/16 193 lb (87.5 kg)  08/14/16 193 lb (87.5 kg)    08/02/16 194 lb (88 kg)    Physical Exam  Constitutional: She is oriented to person, place, and time. She appears well-developed and well-nourished. No distress.  HENT:  Head: Normocephalic and atraumatic.  Right Ear: Hearing and external ear normal.  Left Ear: Hearing and external ear normal.  Nose: Nose normal.  Mouth/Throat: Oropharynx is clear and moist. No oropharyngeal exudate.  Eyes: Conjunctivae, EOM and lids are normal. Pupils are equal, round, and reactive to light. Right eye exhibits no discharge. Left eye exhibits no discharge. No scleral icterus.  Neck: Normal range of motion. Neck supple. No JVD present. No tracheal deviation present. No thyromegaly present.  Cardiovascular: Normal rate, regular rhythm, normal heart sounds and intact distal pulses.  Exam reveals no gallop and no friction rub.   No murmur heard. Pulmonary/Chest: Effort normal. No stridor. No respiratory distress. She has wheezes. She has no rales. She exhibits no tenderness.  Musculoskeletal: Normal range of motion.  Lymphadenopathy:    She has no cervical adenopathy.  Neurological: She is alert and oriented to person, place, and time.  Skin: Skin is warm, dry and intact. No rash noted. She is not diaphoretic. No erythema. No pallor.  Psychiatric: She has a normal mood and affect. Her speech is normal and behavior is normal. Judgment and thought content normal. Cognition and memory are normal.  Nursing note and vitals reviewed.   Results for orders  placed or performed in visit on 05/30/16  Bayer DCA Hb A1c Waived  Result Value Ref Range   Bayer DCA Hb A1c Waived 6.8 <7.0 %      Assessment & Plan:   Problem List Items Addressed This Visit    None    Visit Diagnoses    COPD with acute exacerbation (HCC)    -  Primary   Has failed z-pack. Will treat with levaquin and 12 day taper of prednisone. Recheck 2 weeks.    Relevant Medications   albuterol (PROVENTIL) (2.5 MG/3ML) 0.083% nebulizer solution  2.5 mg (Completed)       Follow up plan: Return in about 2 weeks (around 09/07/2016) for Lung recheck.

## 2016-08-24 NOTE — Telephone Encounter (Signed)
Yeah- have her come at 2:45

## 2016-08-27 ENCOUNTER — Telehealth: Payer: Self-pay | Admitting: Pulmonary Disease

## 2016-08-27 NOTE — Telephone Encounter (Signed)
Pt calling stating we sent in a prescription for [facility] [address] for her Chantix  They told patient as they received it they had an issue and sent it back She is checking on this Please advise

## 2016-08-28 ENCOUNTER — Ambulatory Visit: Payer: 59 | Admitting: Family Medicine

## 2016-08-28 NOTE — Telephone Encounter (Signed)
Received paper from insurance stating that Chantix was denied. No alternatives given. Please advise.

## 2016-08-28 NOTE — Telephone Encounter (Signed)
PA still pending.  

## 2016-08-28 NOTE — Telephone Encounter (Signed)
Pt informed PA was started on 08/24/16 and we are waiting for response. Informed pt if no response by 08/29/16 I will contact insurance company. nothing further needed at this time.

## 2016-08-29 NOTE — Telephone Encounter (Signed)
I suggest neck replacement therapy with either gum or lozenges. This can be purchased over-the-counter at about the same prices cigarettes. We will discuss further at follow-up appointment.  Theodoro Gristave

## 2016-08-29 NOTE — Telephone Encounter (Signed)
Alternatives given is Bupropion ER, Nicotine and Nicotine Polacrilex.

## 2016-08-29 NOTE — Telephone Encounter (Signed)
Pt informed. Nothing further needed. 

## 2016-09-06 ENCOUNTER — Ambulatory Visit (INDEPENDENT_AMBULATORY_CARE_PROVIDER_SITE_OTHER): Payer: 59 | Admitting: Family Medicine

## 2016-09-06 ENCOUNTER — Other Ambulatory Visit: Payer: Self-pay | Admitting: Obstetrics and Gynecology

## 2016-09-06 ENCOUNTER — Encounter: Payer: Self-pay | Admitting: Family Medicine

## 2016-09-06 VITALS — BP 148/99 | HR 75 | Wt 191.0 lb

## 2016-09-06 DIAGNOSIS — J438 Other emphysema: Secondary | ICD-10-CM

## 2016-09-06 DIAGNOSIS — Z1231 Encounter for screening mammogram for malignant neoplasm of breast: Secondary | ICD-10-CM

## 2016-09-06 DIAGNOSIS — Z1239 Encounter for other screening for malignant neoplasm of breast: Secondary | ICD-10-CM

## 2016-09-06 DIAGNOSIS — F4323 Adjustment disorder with mixed anxiety and depressed mood: Secondary | ICD-10-CM | POA: Diagnosis not present

## 2016-09-06 DIAGNOSIS — M1710 Unilateral primary osteoarthritis, unspecified knee: Secondary | ICD-10-CM | POA: Diagnosis not present

## 2016-09-06 MED ORDER — LORAZEPAM 0.5 MG PO TABS: 0.5000 mg | ORAL_TABLET | Freq: Two times a day (BID) | ORAL | 1 refills | Status: DC | PRN

## 2016-09-06 NOTE — Assessment & Plan Note (Signed)
Still not under great control. Feeling better, but significantly wheezy. Will see if we can get her back in with pulmonology earlier than her appointment 4/27- Call with any concerns.

## 2016-09-06 NOTE — Progress Notes (Signed)
BP (!) 148/99   Pulse 75   Wt 191 lb (86.6 kg)   SpO2 94%   BMI 34.93 kg/m    Subjective:    Patient ID: Monica Hull, female    DOB: [birth year], 56 y.o.   MRN: [id number]  HPI: Monica CoddingCynthia Horton Belland is a 56 y.o. female  Chief Complaint  Patient presents with  . Follow-up  . COPD   Very upset today. Her mother just got diagnosed with breast cancer and has been given 3 months to live. She is not sleeping. She is not feeling like herself. She is crying all the time.   Lungs are a bit better, but not great. Still coughing a lot. Still wheezing a lot. Better than she was 2 weeks ago, but still not great.  KNEE PAIN Duration: chronic Involved knee: left Mechanism of injury: unknown Location:diffuse Onset: gradual Severity: moderate  Quality:  dull, aching and tender Frequency: constant Radiation: no Aggravating factors: weight bearing, walking and running  Alleviating factors: ice, APAP, NSAIDs, brace and rest  Status: worse Treatments attempted:diclofenac   Relief with NSAIDs?:  moderate Weakness with weight bearing or walking: no Sensation of giving way: yes Locking: yes Popping: no Bruising: no Swelling: no Redness: no Paresthesias/decreased sensation: no Fevers: no  Relevant past medical, surgical, family and social history reviewed and updated as indicated. Interim medical history since our last visit reviewed. Allergies and medications reviewed and updated.  Review of Systems  Constitutional: Negative.   Respiratory: Negative.   Cardiovascular: Negative.   Musculoskeletal: Positive for arthralgias. Negative for back pain, gait problem, joint swelling, myalgias, neck pain and neck stiffness.  Psychiatric/Behavioral: Positive for dysphoric mood and sleep disturbance. Negative for agitation, behavioral problems, confusion, decreased concentration, hallucinations, self-injury and suicidal ideas. The patient is nervous/anxious. The patient is not  hyperactive.     Per HPI unless specifically indicated above     Objective:    BP (!) 148/99   Pulse 75   Wt 191 lb (86.6 kg)   SpO2 94%   BMI 34.93 kg/m   Wt Readings from Last 3 Encounters:  09/06/16 191 lb (86.6 kg)  08/24/16 193 lb (87.5 kg)  08/14/16 193 lb (87.5 kg)    Physical Exam  Constitutional: She is oriented to person, place, and time. She appears well-developed and well-nourished. No distress.  HENT:  Head: Normocephalic and atraumatic.  Right Ear: Hearing normal.  Left Ear: Hearing normal.  Nose: Nose normal.  Eyes: Conjunctivae and lids are normal. Right eye exhibits no discharge. Left eye exhibits no discharge. No scleral icterus.  Cardiovascular: Normal rate, regular rhythm, normal heart sounds and intact distal pulses.  Exam reveals no gallop and no friction rub.   No murmur heard. Pulmonary/Chest: Effort normal. No respiratory distress. She has wheezes in the right upper field, the right middle field, the right lower field, the left upper field, the left middle field and the left lower field. She has rhonchi in the right lower field and the left lower field. She has no rales. She exhibits no tenderness.  Musculoskeletal: She exhibits tenderness (to L knee, + tenderness along the joint line). She exhibits no edema or deformity.  Neurological: She is alert and oriented to person, place, and time.  Skin: Skin is warm, dry and intact. No rash noted. No erythema. No pallor.  Psychiatric: Her speech is normal and behavior is normal. Judgment and thought content normal. Cognition and memory are normal. She exhibits a depressed mood.  Nursing note and vitals reviewed.   Results for orders placed or performed in visit on 05/30/16  Bayer DCA Hb A1c Waived  Result Value Ref Range   Bayer DCA Hb A1c Waived 6.8 <7.0 %      Assessment & Plan:   Problem List Items Addressed This Visit      Respiratory   COPD (chronic obstructive pulmonary disease) (HCC) - Primary      Still not under great control. Feeling better, but significantly wheezy. Will see if we can get her back in with pulmonology earlier than her appointment 4/27- Call with any concerns.       Other Visit Diagnoses    Breast cancer screening       Mammogram ordered today.   Relevant Orders   MM DIGITAL SCREENING BILATERAL   Adjustment disorder with mixed anxiety and depressed mood       Lorazepam given to patient to help with sleep given mother's recent diagnosis. Call with any concerns.    Arthritis of knee       Will try capcasin on her knee. Continue voltaren. Continue brace. Call with any concerns.        Follow up plan: Return As scheduled.

## 2016-09-06 NOTE — Patient Instructions (Addendum)
Capsacin Gel- MAKE SURE TO WASH YOUR HANDS AFTER YOU USE IT!!!!  Arthritis Arthritis means joint pain. It can also mean joint disease. A joint is a place where bones come together. People who have arthritis may have:  Red joints.  Swollen joints.  Stiff joints.  Warm joints.  A fever.  A feeling of being sick. Follow these instructions at home: Pay attention to any changes in your symptoms. Take these actions to help with your pain and swelling. Medicines   Take over-the-counter and prescription medicines only as told by your doctor.  Do not take aspirin for pain if your doctor says that you may have gout. Activity   Rest your joint if your doctor tells you to.  Avoid activities that make the pain worse.  Exercise your joint regularly as told by your doctor. Try doing exercises like:  Swimming.  Water aerobics.  Biking.  Walking. Joint Care    If your joint is swollen, keep it raised (elevated) if told by your doctor.  If your joint feels stiff in the morning, try taking a warm shower.  If you have diabetes, do not apply heat without asking your doctor.  If told, apply heat to the joint:  Put a towel between the joint and the hot pack or heating pad.  Leave the heat on the area for 20-30 minutes.  If told, apply ice to the joint:  Put ice in a plastic bag.  Place a towel between your skin and the bag.  Leave the ice on for 20 minutes, 2-3 times per day.  Keep all follow-up visits as told by your doctor. Contact a doctor if:  The pain gets worse.  You have a fever. Get help right away if:  You have very bad pain in your joint.  You have swelling in your joint.  Your joint is red.  Many joints become painful and swollen.  You have very bad back pain.  Your leg is very weak.  You cannot control your pee (urine) or poop (stool). This information is not intended to replace advice given to you by your health care provider. Make sure you  discuss any questions you have with your health care provider. Document Released: 09/05/2009 Document Revised: 11/17/2015 Document Reviewed: 09/06/2014 Elsevier Interactive Patient Education  2017 [employer]

## 2016-10-04 ENCOUNTER — Ambulatory Visit (INDEPENDENT_AMBULATORY_CARE_PROVIDER_SITE_OTHER): Payer: 59 | Admitting: Pulmonary Disease

## 2016-10-04 ENCOUNTER — Encounter: Payer: Self-pay | Admitting: Pulmonary Disease

## 2016-10-04 VITALS — BP 136/82 | HR 65 | Wt 190.0 lb

## 2016-10-04 DIAGNOSIS — Z716 Tobacco abuse counseling: Secondary | ICD-10-CM | POA: Diagnosis not present

## 2016-10-04 DIAGNOSIS — F17200 Nicotine dependence, unspecified, uncomplicated: Secondary | ICD-10-CM

## 2016-10-04 DIAGNOSIS — Z720 Tobacco use: Secondary | ICD-10-CM

## 2016-10-04 DIAGNOSIS — J449 Chronic obstructive pulmonary disease, unspecified: Secondary | ICD-10-CM

## 2016-10-04 MED ORDER — VARENICLINE TARTRATE 0.5 MG X 11 & 1 MG X 42 PO MISC: ORAL | 0 refills | Status: DC

## 2016-10-04 MED ORDER — VARENICLINE TARTRATE 1 MG PO TABS: 1.0000 mg | ORAL_TABLET | Freq: Two times a day (BID) | ORAL | 5 refills | Status: DC

## 2016-10-04 NOTE — Patient Instructions (Addendum)
I have prescribed the Chantix starter pak.   Continue Symbicort - two inhalations twice a day  Continue albuterol inhaler - 2 inhalations twice a day as needed for increased shortness of breath, chest tightness, wheezing, cough  Follow-up in 6-8 weeks. Call sooner as needed

## 2016-10-07 NOTE — Progress Notes (Signed)
PULMONARY OFFICE FOLLOW UP NOTE  PROBLEMS:  Moderate COPD Smoker  DATA: PFTs 02/16/16: Mild obstruction (FEV1 1.39 L, 55% pred > 1.58 L 62% pred), mild restriction, normal DLCO CXR 08/10/16: chronic changes, no acute findings  INTERVAL HISTORY: No major events  SUBJ: This is a routine follow up. She has worked on smoking cessation but continues to smoke 0.5 PPD. She is still under numerous personal stressors (mother's illness).  She continues to have class III DOE and chronic cough with scant sputum production. Denies CP, fever, purulent sputum, hemoptysis, LE edema and calf tenderness. She is using Symbicort only once a day and is rarely using albuterol rescue MDI> She is interested in trying Chantix  OBJ: Vitals:   10/04/16 0937  BP: 136/82  Pulse: 65  SpO2: 96%  Weight: 86.2 kg (190 lb)  RA  Appears older than chronological age HEENT: poor dentition, otherwise NAD No JVD or LAN  Diffuse coarse wheezes Reg, no M NABS No edema   DATA: No new CXR or PFTs  IMPRESSION: 1) Moderate COPD with chronic bronchitis 2) Recalcitrant smoker 3) Recent COPD exacerbation improved  PLAN: I have prescribed the Chantix starter pak. If she tolerates it, we will prescribe the continuation pak   Clarified Symbicort - two inhalations twice a day  Continue albuterol inhaler - 2 inhalations twice a day as needed for increased shortness of breath, chest tightness, wheezing, cough  Follow-up in 6-8 weeks. Call sooner as needed  Billy Fischer, MD PCCM service Mobile [phone] Pager [phone] 10/07/2016

## 2016-10-15 ENCOUNTER — Other Ambulatory Visit: Payer: Self-pay | Admitting: Obstetrics and Gynecology

## 2016-10-18 ENCOUNTER — Ambulatory Visit
Admission: RE | Admit: 2016-10-18 | Discharge: 2016-10-18 | Disposition: A | Discharge status: Home / Self Care | Payer: 59 | Source: Ambulatory Visit | Attending: Family Medicine | Admitting: Family Medicine

## 2016-10-18 DIAGNOSIS — Z1239 Encounter for other screening for malignant neoplasm of breast: Secondary | ICD-10-CM

## 2016-10-18 DIAGNOSIS — Z1231 Encounter for screening mammogram for malignant neoplasm of breast: Secondary | ICD-10-CM | POA: Diagnosis present

## 2016-10-19 ENCOUNTER — Inpatient Hospital Stay
Admission: RE | Admit: 2016-10-19 | Discharge: 2016-10-19 | Disposition: A | Discharge status: Home / Self Care | Payer: Self-pay | Source: Ambulatory Visit | Attending: *Deleted | Admitting: *Deleted

## 2016-10-19 ENCOUNTER — Ambulatory Visit: Payer: Self-pay | Admitting: Pulmonary Disease

## 2016-10-19 ENCOUNTER — Other Ambulatory Visit: Payer: Self-pay | Admitting: *Deleted

## 2016-10-19 ENCOUNTER — Ambulatory Visit (INDEPENDENT_AMBULATORY_CARE_PROVIDER_SITE_OTHER): Payer: 59 | Admitting: Family Medicine

## 2016-10-19 ENCOUNTER — Encounter: Payer: Self-pay | Admitting: Family Medicine

## 2016-10-19 VITALS — BP 128/83 | HR 79 | Temp 97.7°F | Wt 191.1 lb

## 2016-10-19 DIAGNOSIS — M25562 Pain in left knee: Secondary | ICD-10-CM | POA: Diagnosis not present

## 2016-10-19 DIAGNOSIS — N393 Stress incontinence (female) (male): Secondary | ICD-10-CM | POA: Diagnosis not present

## 2016-10-19 DIAGNOSIS — Z9289 Personal history of other medical treatment: Secondary | ICD-10-CM

## 2016-10-19 MED ORDER — FESOTERODINE FUMARATE ER 8 MG PO TB24: 8.0000 mg | ORAL_TABLET | Freq: Every day | ORAL | 3 refills | Status: DC

## 2016-10-19 MED ORDER — DICLOFENAC SODIUM 50 MG PO TBEC: 50.0000 mg | DELAYED_RELEASE_TABLET | Freq: Four times a day (QID) | ORAL | 1 refills | Status: DC

## 2016-10-19 MED ORDER — DICLOFENAC SODIUM 75 MG PO TBEC: DELAYED_RELEASE_TABLET | ORAL | 2 refills | Status: DC

## 2016-10-19 NOTE — Addendum Note (Signed)
Addended by: Dorcas Carrow on: 10/19/2016 02:21 PM   Modules accepted: Orders

## 2016-10-19 NOTE — Progress Notes (Signed)
BP 128/83 (BP Location: Left Arm, Patient Position: Sitting, Cuff Size: Normal)   Pulse 79   Temp 97.7 F (36.5 C)   Wt 191 lb 1.6 oz (86.7 kg)   SpO2 96%   BMI 34.95 kg/m    Subjective:    Patient ID: Monica Hull, female    DOB: 12-05-1960, 56 y.o.   MRN: [id number]  HPI: Monica Hull is a 56 y.o. female  Chief Complaint  Patient presents with  . Knee Pain    Left   KNEE PAIN Duration: About a month Involved knee: left Mechanism of injury: unknown Location:anterior Onset: gradual Severity: 5/10- 10/10  Quality:  "pain" Frequency: constant Radiation: no Aggravating factors: nothing   Alleviating factors: nothing   Status: worse Treatments attempted: voltaren   Relief with NSAIDs?:  mild Weakness with weight bearing or walking: no Sensation of giving way: no Locking: yes Popping: yes Bruising: no Swelling: no Redness: no Paresthesias/decreased sensation: no Fevers: no  Relevant past medical, surgical, family and social history reviewed and updated as indicated. Interim medical history since our last visit reviewed. Allergies and medications reviewed and updated.  Review of Systems  Constitutional: Negative.   Respiratory: Negative.   Cardiovascular: Negative.   Psychiatric/Behavioral: Negative.     Per HPI unless specifically indicated above     Objective:    BP 128/83 (BP Location: Left Arm, Patient Position: Sitting, Cuff Size: Normal)   Pulse 79   Temp 97.7 F (36.5 C)   Wt 191 lb 1.6 oz (86.7 kg)   SpO2 96%   BMI 34.95 kg/m   Wt Readings from Last 3 Encounters:  10/19/16 191 lb 1.6 oz (86.7 kg)  10/04/16 190 lb (86.2 kg)  09/06/16 191 lb (86.6 kg)    Physical Exam  Constitutional: She is oriented to person, place, and time. She appears well-developed and well-nourished. No distress.  HENT:  Head: Normocephalic and atraumatic.  Right Ear: Hearing normal.  Left Ear: Hearing normal.  Nose: Nose normal.  Eyes:  Conjunctivae and lids are normal. Right eye exhibits no discharge. Left eye exhibits no discharge. No scleral icterus.  Cardiovascular: Normal rate, regular rhythm, normal heart sounds and intact distal pulses.  Exam reveals no gallop and no friction rub.   No murmur heard. Pulmonary/Chest: Effort normal. No respiratory distress. She has wheezes. She has no rales. She exhibits no tenderness.  Musculoskeletal: Normal range of motion. She exhibits tenderness (along joint line). She exhibits no edema or deformity.  Negative appley's compression and distraction, negative lachman's negative Mcmurrays  Neurological: She is alert and oriented to person, place, and time.  Skin: Skin is warm, dry and intact. No rash noted. She is not diaphoretic. No erythema. No pallor.  Psychiatric: She has a normal mood and affect. Her speech is normal and behavior is normal. Judgment and thought content normal. Cognition and memory are normal.  Vitals reviewed.   Results for orders placed or performed in visit on 05/30/16  Bayer DCA Hb A1c Waived  Result Value Ref Range   Bayer DCA Hb A1c Waived 6.8 <7.0 %      Assessment & Plan:   Problem List Items Addressed This Visit      Other   Stress incontinence    Refill of toviaz given today. Continue to monitor.       Relevant Medications   fesoterodine (TOVIAZ) 8 MG TB24 tablet    Other Visit Diagnoses    Acute pain of left knee    -  Primary   Will increase voltaren to  daily and obtain x-ray. If not better, will do steroid injection next visit.    Relevant Orders   DG Knee Complete 4 Views Left       Follow up plan: Return 2-3 weeks, for Knee, BP. Chol, and DM follow up.

## 2016-10-19 NOTE — Assessment & Plan Note (Signed)
Refill of toviaz given today. Continue to monitor.

## 2016-10-22 ENCOUNTER — Ambulatory Visit
Admission: RE | Admit: 2016-10-22 | Discharge: 2016-10-22 | Disposition: A | Discharge status: Home / Self Care | Payer: 59 | Source: Ambulatory Visit | Attending: Family Medicine | Admitting: Family Medicine

## 2016-10-22 ENCOUNTER — Telehealth: Payer: Self-pay | Admitting: Family Medicine

## 2016-10-22 DIAGNOSIS — M25562 Pain in left knee: Secondary | ICD-10-CM

## 2016-10-22 NOTE — Telephone Encounter (Signed)
Patient notified.  She will think about the injections and give [place] a call if she decides to do it.

## 2016-10-22 NOTE — Telephone Encounter (Signed)
Please let her know that her x-ray showed arthritis in her knee. If she wants a shot in it we can set that up. Thanks!

## 2016-11-02 ENCOUNTER — Other Ambulatory Visit: Payer: Self-pay | Admitting: Family Medicine

## 2016-11-02 ENCOUNTER — Encounter: Payer: Self-pay | Admitting: Family Medicine

## 2016-11-02 ENCOUNTER — Ambulatory Visit (INDEPENDENT_AMBULATORY_CARE_PROVIDER_SITE_OTHER): Payer: 59 | Admitting: Family Medicine

## 2016-11-02 VITALS — BP 128/87 | HR 66 | Temp 97.8°F | Wt 188.4 lb

## 2016-11-02 DIAGNOSIS — I10 Essential (primary) hypertension: Secondary | ICD-10-CM | POA: Diagnosis not present

## 2016-11-02 DIAGNOSIS — E782 Mixed hyperlipidemia: Secondary | ICD-10-CM

## 2016-11-02 DIAGNOSIS — E041 Nontoxic single thyroid nodule: Secondary | ICD-10-CM

## 2016-11-02 DIAGNOSIS — E119 Type 2 diabetes mellitus without complications: Secondary | ICD-10-CM

## 2016-11-02 DIAGNOSIS — M1710 Unilateral primary osteoarthritis, unspecified knee: Secondary | ICD-10-CM | POA: Diagnosis not present

## 2016-11-02 LAB — UA/M W/RFLX CULTURE, ROUTINE
Bilirubin, UA: NEGATIVE
GLUCOSE, UA: NEGATIVE
KETONES UA: NEGATIVE
Leukocytes, UA: NEGATIVE
NITRITE UA: NEGATIVE
RBC, UA: NEGATIVE
SPEC GRAV UA: 1.02 (ref 1.005–1.030)
UUROB: 2 mg/dL — AB (ref 0.2–1.0)
pH, UA: 6 (ref 5.0–7.5)

## 2016-11-02 LAB — MICROSCOPIC EXAMINATION
Bacteria, UA: NONE SEEN
WBC, UA: NONE SEEN /hpf (ref 0–?)

## 2016-11-02 LAB — MICROALBUMIN, URINE WAIVED
Creatinine, Urine Waived: 300 mg/dL (ref 10–300)
Microalb, Ur Waived: 30 mg/L — ABNORMAL HIGH (ref 0–19)
Microalb/Creat Ratio: <30 mg/g (ref <30)

## 2016-11-02 MED ORDER — PANTOPRAZOLE SODIUM 40 MG PO TBEC: 40.0000 mg | DELAYED_RELEASE_TABLET | ORAL | 1 refills | Status: DC

## 2016-11-02 MED ORDER — LISINOPRIL 30 MG PO TABS: 30.0000 mg | ORAL_TABLET | Freq: Every day | ORAL | 1 refills | Status: DC

## 2016-11-02 MED ORDER — BUDESONIDE-FORMOTEROL FUMARATE 160-4.5 MCG/ACT IN AERO: 2.0000 | INHALATION_SPRAY | Freq: Two times a day (BID) | RESPIRATORY_TRACT | 3 refills | Status: DC

## 2016-11-02 NOTE — Assessment & Plan Note (Signed)
Under good control. Continue current regimen. Continue to monitor. Call with any concerns. 

## 2016-11-02 NOTE — Progress Notes (Signed)
BP 128/87 (BP Location: Left Arm, Patient Position: Sitting, Cuff Size: Large)   Pulse 66   Temp 97.8 F (36.6 C)   Wt 188 lb 6.4 oz (85.5 kg)   SpO2 97%   BMI 34.46 kg/m    Subjective:    Patient ID: Monica Hull, female    DOB: 08-30-1960, 56 y.o.   MRN: [id number]  HPI: Monica CoddingCynthia Horton Cashman is a 56 y.o. female  Chief Complaint  Patient presents with  . Hypertension  . Hyperlipidemia  . Diabetes  . Knee Pain   HYPERTENSION / HYPERLIPIDEMIA Satisfied with current treatment? yes Duration of hypertension: chronic BP monitoring frequency: not checking BP medication side effects: no Past BP meds: lisinopril Duration of hyperlipidemia: chronic Cholesterol medication side effects: no Cholesterol supplements: none Past cholesterol medications: atorvastain (lipitor) Medication compliance: excellent compliance Aspirin: yes Recent stressors: yes Recurrent headaches: no Visual changes: no Palpitations: no Dyspnea: no Chest pain: no Lower extremity edema: no Dizzy/lightheaded: no  DIABETES Hypoglycemic episodes:no Polydipsia/polyuria: no Visual disturbance: no Chest pain: no Paresthesias: no Glucose Monitoring: no  Taking Insulin?: no Blood Pressure Monitoring: not checking Retinal Examination: Not up to Date Foot Exam: Done today Diabetic Education: Not Completed Pneumovax: Up to Date Influenza: Up to Date Aspirin: yes  KNEE PAIN Duration: chronic Involved knee: left Mechanism of injury: unknown Location:diffuse Onset: gradual Severity: moderate  Quality:  dull and aching Frequency: intermittent Radiation: no Aggravating factors: weight bearing, walking, running and stairs  Alleviating factors: ice, APAP, NSAIDs and rest  Status: better Treatments attempted: voltaren, rest, ice and heat  Relief with NSAIDs?:  significant Weakness with weight bearing or walking: no Sensation of giving way: occasionally Locking: no Popping:  no Bruising: no Swelling: no Redness: no Paresthesias/decreased sensation: no Fevers: no  Relevant past medical, surgical, family and social history reviewed and updated as indicated. Interim medical history since our last visit reviewed. Allergies and medications reviewed and updated.  Review of Systems  Constitutional: Negative.   Respiratory: Positive for shortness of breath. Negative for apnea, cough, choking, chest tightness, wheezing and stridor.   Cardiovascular: Negative.   Psychiatric/Behavioral: Negative.     Per HPI unless specifically indicated above     Objective:    BP 128/87 (BP Location: Left Arm, Patient Position: Sitting, Cuff Size: Large)   Pulse 66   Temp 97.8 F (36.6 C)   Wt 188 lb 6.4 oz (85.5 kg)   SpO2 97%   BMI 34.46 kg/m   Wt Readings from Last 3 Encounters:  11/02/16 188 lb 6.4 oz (85.5 kg)  10/19/16 191 lb 1.6 oz (86.7 kg)  10/04/16 190 lb (86.2 kg)    Physical Exam  Constitutional: She is oriented to person, place, and time. She appears well-developed and well-nourished. No distress.  HENT:  Head: Normocephalic and atraumatic.  Right Ear: Hearing normal.  Left Ear: Hearing normal.  Nose: Nose normal.  Eyes: Conjunctivae and lids are normal. Right eye exhibits no discharge. Left eye exhibits no discharge. No scleral icterus.  Cardiovascular: Normal rate, regular rhythm, normal heart sounds and intact distal pulses.  Exam reveals no gallop and no friction rub.   No murmur heard. Pulmonary/Chest: Effort normal and breath sounds normal. No respiratory distress. She has no wheezes. She has no rales. She exhibits no tenderness.  Musculoskeletal: Normal range of motion.  Neurological: She is alert and oriented to person, place, and time.  Skin: Skin is warm, dry and intact. No rash noted. She is not  diaphoretic. No erythema. No pallor.  Psychiatric: She has a normal mood and affect. Her speech is normal and behavior is normal. Judgment and  thought content normal. Cognition and memory are normal.  Nursing note and vitals reviewed.   Results for orders placed or performed in visit on 05/30/16  Bayer DCA Hb A1c Waived  Result Value Ref Range   Bayer DCA Hb A1c Waived 6.8 <7.0 %      Assessment & Plan:   Problem List Items Addressed This Visit      Cardiovascular and Mediastinum   Hypertension - Primary    Under good control. Continue current regimen. Continue to monitor. Call with any concerns.       Relevant Medications   lisinopril (PRINIVIL,ZESTRIL) 30 MG tablet   Other Relevant Orders   Comprehensive metabolic panel   CBC with Differential/Platelet   UA/M w/rflx Culture, Routine   Microalbumin, Urine Waived     Endocrine   Thyroid nodule    Labs drawn today. Await results.       Relevant Orders   Comprehensive metabolic panel   CBC with Differential/Platelet   TSH   UA/M w/rflx Culture, Routine     Other   Hyperlipidemia    Under good control. Continue current regimen. Continue to monitor. Call with any concerns.       Relevant Medications   lisinopril (PRINIVIL,ZESTRIL) 30 MG tablet   Other Relevant Orders   Comprehensive metabolic panel   Lipid Panel w/o Chol/HDL Ratio   CBC with Differential/Platelet   UA/M w/rflx Culture, Routine   Diet-controlled diabetes mellitus (HCC)    Under good control with A1c of 6.2. Continue current regimen. Continue to monitor. Call with any concerns.       Relevant Medications   lisinopril (PRINIVIL,ZESTRIL) 30 MG tablet   Other Relevant Orders   Bayer DCA Hb A1c Waived   Comprehensive metabolic panel   CBC with Differential/Platelet   UA/M w/rflx Culture, Routine   Microalbumin, Urine Waived    Other Visit Diagnoses    Arthritis of knee       Doing well on voltaren. Continue to monitor. Call with any concerns.      Follow up plan: Return in about 3 months (around 02/02/2017) for Follow up DM.

## 2016-11-02 NOTE — Assessment & Plan Note (Addendum)
Under good control with A1c of 6.2. Continue current regimen. Continue to monitor. Call with any concerns.  

## 2016-11-02 NOTE — Assessment & Plan Note (Signed)
Labs drawn today. Await results.  

## 2016-11-03 LAB — COMPREHENSIVE METABOLIC PANEL
ALK PHOS: 136 IU/L — AB (ref 39–117)
ALT: 44 IU/L — AB (ref 0–32)
AST: 39 IU/L (ref 0–40)
Albumin/Globulin Ratio: 1.7 (ref 1.2–2.2)
Albumin: 4.3 g/dL (ref 3.5–5.5)
BILIRUBIN TOTAL: 0.7 mg/dL (ref 0.0–1.2)
BUN/Creatinine Ratio: 13 (ref 9–23)
BUN: 10 mg/dL (ref 6–24)
CALCIUM: 9.4 mg/dL (ref 8.7–10.2)
CHLORIDE: 106 mmol/L (ref 96–106)
CO2: 24 mmol/L (ref 18–29)
Creatinine, Ser: 0.77 mg/dL (ref 0.57–1.00)
GFR calc Af Amer: 100 mL/min/{1.73_m2} (ref >59)
GFR calc non Af Amer: 87 mL/min/{1.73_m2} (ref >59)
Globulin, Total: 2.6 g/dL (ref 1.5–4.5)
Glucose: 123 mg/dL — ABNORMAL HIGH (ref 65–99)
Potassium: 4.2 mmol/L (ref 3.5–5.2)
Sodium: 144 mmol/L (ref 134–144)
Total Protein: 6.9 g/dL (ref 6.0–8.5)

## 2016-11-03 LAB — CBC WITH DIFFERENTIAL/PLATELET
Basophils Absolute: 0.1 10*3/uL (ref 0.0–0.2)
Basos: 1 %
EOS (ABSOLUTE): 0.6 10*3/uL — ABNORMAL HIGH (ref 0.0–0.4)
EOS: 7 %
HEMATOCRIT: 46.1 % (ref 34.0–46.6)
HEMOGLOBIN: 16 g/dL — AB (ref 11.1–15.9)
IMMATURE GRANS (ABS): 0 10*3/uL (ref 0.0–0.1)
IMMATURE GRANULOCYTES: 0 %
LYMPHS: 36 %
Lymphocytes Absolute: 3 10*3/uL (ref 0.7–3.1)
MCH: 30.8 pg (ref 26.6–33.0)
MCHC: 34.7 g/dL (ref 31.5–35.7)
MCV: 89 fL (ref 79–97)
MONOCYTES: 5 %
MONOS ABS: 0.4 10*3/uL (ref 0.1–0.9)
NEUTROS PCT: 51 %
Neutrophils Absolute: 4.3 10*3/uL (ref 1.4–7.0)
Platelets: 259 10*3/uL (ref 150–379)
RBC: 5.2 x10E6/uL (ref 3.77–5.28)
RDW: 14.3 % (ref 12.3–15.4)
WBC: 8.4 10*3/uL (ref 3.4–10.8)

## 2016-11-03 LAB — LIPID PANEL W/O CHOL/HDL RATIO
CHOLESTEROL TOTAL: 152 mg/dL (ref 100–199)
HDL: 39 mg/dL — AB (ref >39)
LDL Calculated: 88 mg/dL (ref 0–99)
TRIGLYCERIDES: 126 mg/dL (ref 0–149)
VLDL CHOLESTEROL CAL: 25 mg/dL (ref 5–40)

## 2016-11-03 LAB — TSH: TSH: 1.49 u[IU]/mL (ref 0.450–4.500)

## 2016-12-03 ENCOUNTER — Ambulatory Visit (INDEPENDENT_AMBULATORY_CARE_PROVIDER_SITE_OTHER): Payer: 59 | Admitting: Pulmonary Disease

## 2016-12-03 ENCOUNTER — Encounter: Payer: Self-pay | Admitting: Pulmonary Disease

## 2016-12-03 VITALS — BP 138/90 | HR 83 | Ht 62.0 in | Wt 189.0 lb

## 2016-12-03 DIAGNOSIS — J42 Unspecified chronic bronchitis: Secondary | ICD-10-CM | POA: Diagnosis not present

## 2016-12-03 DIAGNOSIS — J449 Chronic obstructive pulmonary disease, unspecified: Secondary | ICD-10-CM

## 2016-12-03 DIAGNOSIS — F17200 Nicotine dependence, unspecified, uncomplicated: Secondary | ICD-10-CM

## 2016-12-03 NOTE — Patient Instructions (Signed)
Continue Symbicort inhaler, 2 inhalations twice a day Continue albuterol rescue inhaler as needed Continue efforts at smoking cessation When the new insurance kicks in on July 1, we will prescribe again Chantix Follow-up in 3-4 months

## 2016-12-04 NOTE — Progress Notes (Signed)
PULMONARY OFFICE FOLLOW UP NOTE  PROBLEMS:  Moderate COPD Smoker  DATA: PFTs 02/16/16: Mild obstruction (FEV1 1.39 L, 55% pred > 1.58 L 62% pred), mild restriction, normal DLCO CXR 08/10/16: chronic changes, no acute findings  INTERVAL HISTORY: No major events  SUBJ: This is a routine follow up. She continues to work on smoking cessation - down to 5 cigs/day. Was not able to get Chantix due to cost but her insurance is changing in July and she wishes to try it when that happens. Remains under numerous personal stressors (mother's illness).  No change in class III DOE and chronic cough with scant sputum production. Denies CP, fever, purulent sputum, hemoptysis, LE edema and calf tenderness.   OBJ: Vitals:   12/03/16 1404 12/03/16 1406  BP:  138/90  Pulse:  83  SpO2:  98%  Weight: 85.7 kg (189 lb)   Height: 5\' 2"  (1.575 m)   RA  Appears older than chronological age HEENT: poor dentition, otherwise NAD No JVD or LAN  Few rhonchi which clear with cough, no wheezes Reg, no M NABS No edema   DATA: No new CXR or PFTs  IMPRESSION: 1) Moderate COPD with chronic bronchitis 2) Smoker - working on smoking cessation  PLAN: Continue Symbicort inhaler, 2 inhalations twice a day Continue albuterol rescue inhaler as needed Continue efforts at smoking cessation When the new insurance is implemented on July 1, we will prescribe again Chantix Follow-up in 3-4 months  Billy Fischeravid Simonds, MD PCCM service Mobile [phone] Pager [phone] 12/04/2016 8:08 PM

## 2016-12-05 ENCOUNTER — Ambulatory Visit (INDEPENDENT_AMBULATORY_CARE_PROVIDER_SITE_OTHER): Payer: 59 | Admitting: Family Medicine

## 2016-12-05 ENCOUNTER — Encounter: Payer: Self-pay | Admitting: Family Medicine

## 2016-12-05 VITALS — BP 123/81 | HR 69 | Temp 97.8°F | Wt 188.0 lb

## 2016-12-05 DIAGNOSIS — J0100 Acute maxillary sinusitis, unspecified: Secondary | ICD-10-CM

## 2016-12-05 DIAGNOSIS — B370 Candidal stomatitis: Secondary | ICD-10-CM

## 2016-12-05 MED ORDER — MAGIC MOUTHWASH W/LIDOCAINE: 5.0000 mL | Freq: Three times a day (TID) | ORAL | 1 refills | Status: DC | PRN

## 2016-12-05 MED ORDER — AMOXICILLIN-POT CLAVULANATE 875-125 MG PO TABS: 1.0000 | ORAL_TABLET | Freq: Two times a day (BID) | ORAL | 0 refills | Status: DC

## 2016-12-05 NOTE — Progress Notes (Addendum)
BP 123/81   Pulse 69   Temp 97.8 F (36.6 C)   Wt 188 lb (85.3 kg)   SpO2 95%   BMI 34.39 kg/m    Subjective:    Patient ID: Monica Hull, female    DOB: 1961/01/31, 56 y.o.   MRN: [id number]  HPI: Monica CoddingCynthia Horton Gastelum is a 56 y.o. female  Chief Complaint  Patient presents with  . Sinusitis    x 2 weeks, head congestion, some runny nose, some sneezing. right ear pain. cough (but normal for her), wheezing. No sinus drainage, chest congestion, no fever  . Thrush    tongue is sore and burned, hurts to swallow, white patches   Patient presents with 2 weeks of congestion, ear pain, wheezing, sinus HAs, facial pain, productive cough. Denies fever, chills, CP, N/V/D. Taking tylenol and regular regimen but nothing extra for sxs. No sick contacts reported. Does have a hx of COPD and asthma, current smoker.   Also c/o sore, raw feeling tongue with intermittent white patches noted and some pain with swallowing. Does use steroid inhalers but washes her mouth out well after each use.   Past Medical History:  Diagnosis Date  . Asthma   . Carpal tunnel syndrome   . Chronic kidney disease    H/O KIDNEY STONES  . COPD (chronic obstructive pulmonary disease) (HCC)   . Diabetes mellitus without complication (HCC)    DIET CONTROLLED  . Elevated liver function tests   . GERD (gastroesophageal reflux disease)   . Headache    MIGRAINES  . Hyperlipidemia   . Hypertension   . Impaired fasting glucose   . Sleep apnea    Not using CPAP- referred to sleep center  . Tobacco abuse    Social History   Social History  . Marital status: Married    Spouse name: N/A  . Number of children: N/A  . Years of education: N/A   Occupational History  . Not on file.   Social History Main Topics  . Smoking status: Current Every Day Smoker    Packs/day: 0.25    Years: 39.00    Types: Cigarettes  . Smokeless tobacco: Never Used  . Alcohol use No  . Drug use: No  . Sexual activity:  Not Currently    Partners: Male   Other Topics Concern  . Not on file   Social History Narrative  . No narrative on file    Relevant past medical, surgical, family and social history reviewed and updated as indicated. Interim medical history since our last visit reviewed. Allergies and medications reviewed and updated.  Review of Systems  Constitutional: Negative.   HENT: Positive for congestion, ear pain, sinus pain, sinus pressure and sore throat.        Tongue pain  Respiratory: Positive for cough and wheezing.   Genitourinary: Negative.   Musculoskeletal: Negative.   Neurological: Positive for headaches.  Psychiatric/Behavioral: Negative.     Per HPI unless specifically indicated above     Objective:    BP 123/81   Pulse 69   Temp 97.8 F (36.6 C)   Wt 188 lb (85.3 kg)   SpO2 95%   BMI 34.39 kg/m   Wt Readings from Last 3 Encounters:  12/05/16 188 lb (85.3 kg)  12/03/16 189 lb (85.7 kg)  11/02/16 188 lb 6.4 oz (85.5 kg)    Physical Exam  Constitutional: She is oriented to person, place, and time. She appears well-developed and well-nourished. No  distress.  HENT:  Head: Atraumatic.  Right Ear: External ear normal.  Left Ear: External ear normal.  Nose: Nose normal.  Tongue appears erythematous, mildly edematous, and with some cracking and white coating Oropharynx also mildly erythematous  Eyes: Conjunctivae are normal. Pupils are equal, round, and reactive to light.  Neck: Normal range of motion. Neck supple.  Cardiovascular: Normal rate and normal heart sounds.   Pulmonary/Chest: Effort normal. No respiratory distress. She has wheezes.  Musculoskeletal: Normal range of motion.  Neurological: She is alert and oriented to person, place, and time.  Skin: Skin is warm and dry.  Psychiatric: She has a normal mood and affect. Her behavior is normal.  Nursing note and vitals reviewed.     Assessment & Plan:   Problem List Items Addressed This Visit     None    Visit Diagnoses    Acute maxillary sinusitis, recurrence not specified    -  Primary   Will treat with augmentin and mucinex prn. Supportive care discussed. F/u if worsening or no improvement   Relevant Medications   magic mouthwash w/lidocaine SOLN   amoxicillin-clavulanate (AUGMENTIN) 875-125 MG tablet   Oral thrush       Magic mouthwash with lidocaine sent. Continue good rinses after using symbicort, add probiotic daily. F/u if no improvement   Relevant Medications   magic mouthwash w/lidocaine SOLN      Follow up plan: Return for as scheduled.

## 2016-12-11 ENCOUNTER — Telehealth: Payer: Self-pay | Admitting: Family Medicine

## 2016-12-11 NOTE — Telephone Encounter (Signed)
Please call Marychelle to give her the directions to the compound script that [facility] sent on the patient.  Thanks  [phone]

## 2016-12-12 NOTE — Telephone Encounter (Signed)
Pharmacy notified.

## 2016-12-12 NOTE — Telephone Encounter (Signed)
Per Fleet Contrasachel, she'd like it to be Nystatin and Lidocaine.

## 2016-12-27 ENCOUNTER — Telehealth: Payer: Self-pay

## 2016-12-27 ENCOUNTER — Other Ambulatory Visit: Payer: Self-pay | Admitting: *Deleted

## 2016-12-27 MED ORDER — VARENICLINE TARTRATE 0.5 MG X 11 & 1 MG X 42 PO MISC: ORAL | 0 refills | Status: DC

## 2016-12-27 MED ORDER — VARENICLINE TARTRATE 1 MG PO TABS: 1.0000 mg | ORAL_TABLET | Freq: Two times a day (BID) | ORAL | 2 refills | Status: DC

## 2016-12-27 NOTE — Telephone Encounter (Signed)
Pt would like a rx for Chantix sent to [facility].

## 2016-12-27 NOTE — Telephone Encounter (Signed)
Do you want me to prescribe the starter pack and then  Continuous for the Chantix the pt is requesting?

## 2016-12-27 NOTE — Telephone Encounter (Signed)
Per DS, send in Chantix starter pack and then monthly. RXs sent. Nothing further needed.

## 2017-01-18 ENCOUNTER — Ambulatory Visit
Admission: RE | Admit: 2017-01-18 | Discharge: 2017-01-18 | Disposition: A | Discharge status: Home / Self Care | Payer: BLUE CROSS/BLUE SHIELD | Source: Ambulatory Visit | Attending: Family Medicine | Admitting: Family Medicine

## 2017-01-18 ENCOUNTER — Encounter: Payer: Self-pay | Admitting: Family Medicine

## 2017-01-18 ENCOUNTER — Ambulatory Visit (INDEPENDENT_AMBULATORY_CARE_PROVIDER_SITE_OTHER): Payer: BLUE CROSS/BLUE SHIELD | Admitting: Family Medicine

## 2017-01-18 VITALS — BP 121/80 | HR 66 | Temp 98.0°F | Wt 189.0 lb

## 2017-01-18 DIAGNOSIS — J438 Other emphysema: Secondary | ICD-10-CM

## 2017-01-18 DIAGNOSIS — R062 Wheezing: Secondary | ICD-10-CM | POA: Insufficient documentation

## 2017-01-18 DIAGNOSIS — J42 Unspecified chronic bronchitis: Secondary | ICD-10-CM | POA: Insufficient documentation

## 2017-01-18 MED ORDER — ALBUTEROL SULFATE HFA 108 (90 BASE) MCG/ACT IN AERS: 2.0000 | INHALATION_SPRAY | Freq: Four times a day (QID) | RESPIRATORY_TRACT | 11 refills | Status: DC | PRN

## 2017-01-18 MED ORDER — BUDESONIDE-FORMOTEROL FUMARATE 160-4.5 MCG/ACT IN AERO: 2.0000 | INHALATION_SPRAY | Freq: Two times a day (BID) | RESPIRATORY_TRACT | 3 refills | Status: DC

## 2017-01-18 MED ORDER — ALBUTEROL SULFATE (2.5 MG/3ML) 0.083% IN NEBU
2.5000 mg | INHALATION_SOLUTION | Freq: Once | RESPIRATORY_TRACT | Status: AC
  Administered 2017-01-18: 2.5 mg via RESPIRATORY_TRACT

## 2017-01-18 MED ORDER — ALBUTEROL SULFATE (2.5 MG/3ML) 0.083% IN NEBU: 2.5000 mg | INHALATION_SOLUTION | Freq: Four times a day (QID) | RESPIRATORY_TRACT | 1 refills | Status: DC | PRN

## 2017-01-18 MED ORDER — PREDNISONE 10 MG PO TABS: ORAL_TABLET | ORAL | 0 refills | Status: DC

## 2017-01-18 MED ORDER — AMOXICILLIN-POT CLAVULANATE 875-125 MG PO TABS: 1.0000 | ORAL_TABLET | Freq: Two times a day (BID) | ORAL | 0 refills | Status: DC

## 2017-01-18 NOTE — Progress Notes (Signed)
BP 121/80   Pulse 66   Temp 98 F (36.7 C)   Wt 189 lb (85.7 kg)   SpO2 97%   BMI 34.57 kg/m    Subjective:    Patient ID: Monica Hull, female    DOB: 1960-09-30, 56 y.o.   MRN: [id number]  HPI: Monica Hull is a 56 y.o. female  Chief Complaint  Patient presents with  . URI    x 1 day, wheezing, nasal congestion, cough some runny nose, ears feel stopped up. No fever, No chest congestion, no sore throat.   Patient here with 1 day of worsening wheezing,congestion,productive cough, chest tightness, and SOB. Denies fever, chills, CP, palpitations. Does note that she had stopped her symbicort for quite some time up until yesterday because of the thrush infection she had gotten previously. Still using albuterol fairly regularly. Still smoking, no interest in quitting.   Relevant past medical, surgical, family and social history reviewed and updated as indicated. Interim medical history since our last visit reviewed. Allergies and medications reviewed and updated.  Review of Systems  Constitutional: Negative.   HENT: Positive for congestion.   Respiratory: Positive for cough, chest tightness and shortness of breath.   Cardiovascular: Negative.   Gastrointestinal: Negative.   Genitourinary: Negative.   Musculoskeletal: Negative.   Neurological: Negative.   Psychiatric/Behavioral: Negative.    Per HPI unless specifically indicated above     Objective:    BP 121/80   Pulse 66   Temp 98 F (36.7 C)   Wt 189 lb (85.7 kg)   SpO2 97%   BMI 34.57 kg/m   Wt Readings from Last 3 Encounters:  01/18/17 189 lb (85.7 kg)  12/05/16 188 lb (85.3 kg)  12/03/16 189 lb (85.7 kg)    Physical Exam  Constitutional: She is oriented to person, place, and time. She appears well-developed and well-nourished. No distress.  HENT:  Head: Atraumatic.  Right Ear: External ear normal.  Left Ear: External ear normal.  Nose: Nose normal.  Mouth/Throat: Oropharynx is  clear and moist.  Eyes: Pupils are equal, round, and reactive to light. Conjunctivae are normal.  Neck: Normal range of motion. Neck supple.  Cardiovascular: Normal rate and normal heart sounds.   Pulmonary/Chest: Effort normal. She has wheezes (moderate, diffuse).  Musculoskeletal: Normal range of motion.  Neurological: She is alert and oriented to person, place, and time.  Skin: Skin is warm and dry.  Psychiatric: She has a normal mood and affect. Her behavior is normal.  Nursing note and vitals reviewed.     Assessment & Plan:   Problem List Items Addressed This Visit      Respiratory   COPD (chronic obstructive pulmonary disease) (HCC) - Primary    Nebulizer tx performed in office with some relief. Will get CXR. Start augmentin and prednisone taper in the meantime. Continue inhaler and home neb regimen. Discussed importance of consistent Symbicort use for flare reduction.       Relevant Medications   albuterol (PROVENTIL HFA;VENTOLIN HFA) 108 (90 Base) MCG/ACT inhaler   albuterol (PROVENTIL) (2.5 MG/3ML) 0.083% nebulizer solution   budesonide-formoterol (SYMBICORT) 160-4.5 MCG/ACT inhaler   predniSONE (DELTASONE) 10 MG tablet   albuterol (PROVENTIL) (2.5 MG/3ML) 0.083% nebulizer solution 2.5 mg (Completed)    Other Visit Diagnoses    Wheezing       Relevant Medications   albuterol (PROVENTIL) (2.5 MG/3ML) 0.083% nebulizer solution 2.5 mg (Completed)   Other Relevant Orders   PR DEMO &/OR  EVAL,PT USE,AEROSOL DEVICE   DG Chest 2 View (Completed)       Follow up plan: Return for as scheduled.

## 2017-01-18 NOTE — Assessment & Plan Note (Signed)
Nebulizer tx performed in office with some relief. Will get CXR. Start augmentin and prednisone taper in the meantime. Continue inhaler and home neb regimen. Discussed importance of consistent Symbicort use for flare reduction.

## 2017-01-20 NOTE — Patient Instructions (Signed)
Follow up if no improvement 

## 2017-02-04 ENCOUNTER — Encounter: Payer: Self-pay | Admitting: Family Medicine

## 2017-02-04 ENCOUNTER — Ambulatory Visit (INDEPENDENT_AMBULATORY_CARE_PROVIDER_SITE_OTHER): Payer: BLUE CROSS/BLUE SHIELD | Admitting: Family Medicine

## 2017-02-04 ENCOUNTER — Other Ambulatory Visit: Payer: Self-pay | Admitting: Family Medicine

## 2017-02-04 VITALS — BP 139/88 | HR 62 | Temp 97.9°F | Wt 187.0 lb

## 2017-02-04 DIAGNOSIS — E041 Nontoxic single thyroid nodule: Secondary | ICD-10-CM | POA: Diagnosis not present

## 2017-02-04 DIAGNOSIS — R05 Cough: Secondary | ICD-10-CM | POA: Diagnosis not present

## 2017-02-04 DIAGNOSIS — R55 Syncope and collapse: Secondary | ICD-10-CM | POA: Diagnosis not present

## 2017-02-04 DIAGNOSIS — R059 Cough, unspecified: Secondary | ICD-10-CM

## 2017-02-04 DIAGNOSIS — E119 Type 2 diabetes mellitus without complications: Secondary | ICD-10-CM

## 2017-02-04 NOTE — Assessment & Plan Note (Signed)
Under good control, but A1c up to 6.5 from 6.3- work on diet and exercise and we will recheck in 3 months.

## 2017-02-04 NOTE — Assessment & Plan Note (Signed)
Due for recheck on her thyroid- ordered today.

## 2017-02-04 NOTE — Progress Notes (Signed)
BP 139/88 (BP Location: Right Arm, Patient Position: Sitting, Cuff Size: Large)   Pulse 62   Temp 97.9 F (36.6 C)   Wt 187 lb (84.8 kg)   SpO2 96%   BMI 34.20 kg/m    Subjective:    Patient ID: Monica Hull, female    DOB: 1961/03/22, 56 y.o.   MRN: [id number]  HPI: Monica Hull is a 56 y.o. female  Chief Complaint  Patient presents with  . Diabetes   DIABETES Hypoglycemic episodes:no Polydipsia/polyuria: no Visual disturbance: no Chest pain: no Paresthesias: no Glucose Monitoring: no  Accucheck frequency: Not Checking Taking Insulin?: no Blood Pressure Monitoring: not checking Retinal Examination: Up to Date Foot Exam: Up to Date Diabetic Education: Completed Pneumovax: Up to Date Influenza: Up to Date Aspirin: yes  Passed out on Friday- it's been a while since she did it. Has been worked up for it before. She was sitting and got sick on her stomach, then went out. She was out about 10 seconds and then she woke up. No confusion. Was tired for the rest of the day. Has had work up for this in the past, but not recently. Follows with Dr. Gwinda Passe annually- due to see him shortly.  Relevant past medical, surgical, family and social history reviewed and updated as indicated. Interim medical history since our last visit reviewed. Allergies and medications reviewed and updated.  Review of Systems  Constitutional: Negative.   Respiratory: Positive for cough. Negative for apnea, choking, chest tightness, shortness of breath, wheezing and stridor.   Cardiovascular: Negative.   Gastrointestinal: Negative.   Neurological: Positive for syncope. Negative for dizziness, tremors, seizures, facial asymmetry, speech difficulty, weakness, light-headedness, numbness and headaches.  Psychiatric/Behavioral: Negative.     Per HPI unless specifically indicated above     Objective:    BP 139/88 (BP Location: Right Arm, Patient Position: Sitting, Cuff  Size: Large)   Pulse 62   Temp 97.9 F (36.6 C)   Wt 187 lb (84.8 kg)   SpO2 96%   BMI 34.20 kg/m   Wt Readings from Last 3 Encounters:  02/04/17 187 lb (84.8 kg)  01/18/17 189 lb (85.7 kg)  12/05/16 188 lb (85.3 kg)    Physical Exam  Constitutional: She is oriented to person, place, and time. She appears well-developed and well-nourished. No distress.  HENT:  Head: Normocephalic and atraumatic.  Right Ear: Hearing and external ear normal.  Left Ear: Hearing and external ear normal.  Nose: Nose normal.  Mouth/Throat: Oropharynx is clear and moist. No oropharyngeal exudate.  Eyes: Pupils are equal, round, and reactive to light. Conjunctivae and lids are normal. Right eye exhibits no discharge. Left eye exhibits no discharge. No scleral icterus.  Neck: Normal range of motion. Neck supple. No JVD present. No tracheal deviation present. No thyromegaly present.  Cardiovascular: Normal rate, regular rhythm, normal heart sounds and intact distal pulses.  Exam reveals no gallop and no friction rub.   No murmur heard. Pulmonary/Chest: Effort normal and breath sounds normal. No stridor. No respiratory distress. She has no wheezes. She has no rales. She exhibits no tenderness.  Musculoskeletal: Normal range of motion.  Lymphadenopathy:    She has no cervical adenopathy.  Neurological: She is alert and oriented to person, place, and time.  Skin: Skin is warm, dry and intact. No rash noted. She is not diaphoretic. No erythema. No pallor.  Psychiatric: She has a normal mood and affect. Her speech is normal and behavior is  normal. Judgment and thought content normal. Cognition and memory are normal.  Nursing note and vitals reviewed.   Results for orders placed or performed in visit on 11/02/16  CBC with Differential/Platelet  Result Value Ref Range   WBC 8.4 3.4 - 10.8 x10E3/uL   RBC 5.20 3.77 - 5.28 x10E6/uL   Hemoglobin 16.0 (H) 11.1 - 15.9 g/dL   Hematocrit 16.1 09.6 - 46.6 %   MCV 89  79 - 97 fL   MCH 30.8 26.6 - 33.0 pg   MCHC 34.7 31.5 - 35.7 g/dL   RDW 04.5 40.9 - 81.1 %   Platelets 259 150 - 379 x10E3/uL   Neutrophils 51 Not Estab. %   Lymphs 36 Not Estab. %   Monocytes 5 Not Estab. %   Eos 7 Not Estab. %   Basos 1 Not Estab. %   Neutrophils Absolute 4.3 1.4 - 7.0 x10E3/uL   Lymphocytes Absolute 3.0 0.7 - 3.1 x10E3/uL   Monocytes Absolute 0.4 0.1 - 0.9 x10E3/uL   EOS (ABSOLUTE) 0.6 (H) 0.0 - 0.4 x10E3/uL   Basophils Absolute 0.1 0.0 - 0.2 x10E3/uL   Immature Granulocytes 0 Not Estab. %   Immature Grans (Abs) 0.0 0.0 - 0.1 x10E3/uL  Comprehensive metabolic panel  Result Value Ref Range   Glucose 123 (H) 65 - 99 mg/dL   BUN 10 6 - 24 mg/dL   Creatinine, Ser 9.14 0.57 - 1.00 mg/dL   GFR calc non Af Amer 87 >59 mL/min/1.73   GFR calc Af Amer 100 >59 mL/min/1.73   BUN/Creatinine Ratio 13 9 - 23   Sodium 144 134 - 144 mmol/L   Potassium 4.2 3.5 - 5.2 mmol/L   Chloride 106 96 - 106 mmol/L   CO2 24 18 - 29 mmol/L   Calcium 9.4 8.7 - 10.2 mg/dL   Total Protein 6.9 6.0 - 8.5 g/dL   Albumin 4.3 3.5 - 5.5 g/dL   Globulin, Total 2.6 1.5 - 4.5 g/dL   Albumin/Globulin Ratio 1.7 1.2 - 2.2   Bilirubin Total 0.7 0.0 - 1.2 mg/dL   Alkaline Phosphatase 136 (H) 39 - 117 IU/L   AST 39 0 - 40 IU/L   ALT 44 (H) 0 - 32 IU/L  Lipid Panel w/o Chol/HDL Ratio  Result Value Ref Range   Cholesterol, Total 152 100 - 199 mg/dL   Triglycerides 782 0 - 149 mg/dL   HDL 39 (L) >95 mg/dL   VLDL Cholesterol Cal 25 5 - 40 mg/dL   LDL Calculated 88 0 - 99 mg/dL  TSH  Result Value Ref Range   TSH 1.490 0.450 - 4.500 uIU/mL      Assessment & Plan:   Problem List Items Addressed This Visit      Endocrine   Thyroid nodule    Due for recheck on her thyroid- ordered today.      Relevant Orders   US Soft Tissue Head/Neck     Other   Diet-controlled diabetes mellitus (HCC) - Primary    Under good control, but A1c up to 6.5 from 6.3- work on diet and exercise and we will recheck  in 3 months.       Relevant Orders   Bayer DCA Hb A1c Waived    Other Visit Diagnoses    Syncope, unspecified syncope type       EKG normal. Has been worked up before. Due to see cardiology again. Appointment scheduled. Increase fluids, stay out of the heat. Call with concerns.  Relevant Orders   EKG 12-Lead (Completed)   Cough       Lungs clear today. Will continue COPD medications. Call if not getting better. Continue to monitor.        Follow up plan: Return in about 3 months (around 05/07/2017) for Physical.

## 2017-02-04 NOTE — Patient Instructions (Addendum)
Heat Exhaustion Information °WHAT IS HEAT EXHAUSTION? °Heat exhaustion happens when your body gets overheated from hot weather or from exercise. Heat exhaustion can lead to heat stroke, a life-threatening condition that requires emergency care. °Heat exhaustion is more likely to develop when: °· You are exercising or being active. °· You are in hot or humid weather. °· You are in bright sunshine. °· You are not drinking enough water. ° °WHO IS AT RISK FOR THIS CONDITION? °This condition is more likely to develop in: °· People who exercise in hot or humid weather. °· People who exercise beyond their fitness level. °· People who wear clothing that does not allow sweat to evaporate. °· People who are dehydrated. °· People who drink a lot of alcoholic beverages or beverages that have caffeine. This can lead to dehydration. °· People who are age 65 or older. °· Children. °· People who have a medical condition such as heart disease, poor circulation, sickle cell disease, or high blood pressure. °· People who have a fever. °· People who are very overweight (obese). ° °WHAT ARE THE SYMPTOMS OF THIS CONDITION? °Symptoms of heat exhaustion include: °· Heavy sweating along with feeling weak, dizzy, light-headed, and nauseous. °· Rapid heartbeat. °· Headache. °· Urine that is darker than normal. °· Muscle cramps, such as in the leg or side (flank). °· Moist, cool, and clammy skin. °· Fatigue. °· Thirst. °· Confusion. °· Fainting. ° °WHAT SHOULD I DO IF I THINK I HAVE THIS CONDITION? °If you think that you have heat exhaustion, call your health care provider. Follow his or her instructions. You should also: °· Call a friend or a family member and ask him or her to stay with you. °· Move to a cooler location, such as: °? Into the shade. °? In front of a fan. °? An air-conditioned space. °· Lie down and rest. °· Slowly drink nonalcoholic, caffeine-free fluids. °· Take off tight clothing or extra clothing. °· Take a cool bath or  shower, if possible. If you do not have access to a bath or shower, dab or mist cool water on your skin. ° °WHY IS IT IMPORTANT TO TREAT THIS CONDITION? °It is important to take care of yourself and treat heat exhaustion as soon as possible. Untreated heat exhaustion can turn into heat stroke, which is a life-threatening condition that requires urgent medical treatment. °HOW CAN I PREVENT THIS CONDITION? °To prevent this condition: °· Drink enough fluid to keep your urine clear or pale yellow. This helps your body to sweat properly. °· Avoid outdoor activities on very hot or humid days. °· Do not exercise or do other physical activity when you are not feeling well. °· Take breaks often during physical activity. °· Wear light-colored, loose-fitting, and lightweight clothing when it is hot outside. °· Wear a hat and use sunscreen when exercising outdoors. °· Avoid being outside during the hottest times of the day. °· Check with your health care provider before you start any new activity, especially if you take medicine or have a medical condition. °· Start any new activity slowly and work up to your fitness level. ° °HOW CAN I HELP TO PROTECT ELDERLY RELATIVES AND NEIGHBORS FROM THIS CONDITION? °People who are age 65 or older are at greater risk for heat exhaustion. Their bodies have a harder time adjusting to heat. They are also more likely to have a medical condition or be on medicines that increase their risk for heat exhaustion. They may get heat exhaustion   indoors if the heat is high for several days. You can help to protect them during hot weather by: °· Checking on them two or more times each day. °· Making sure that they are drinking plenty of cool, nonalcoholic, and caffeine-free fluids. °· Making sure that they use their air conditioner. °· Taking them to a location where air conditioning is available. °· Talking with their health care provider about their medical needs, medicines, and fluid  requirements. ° °SEEK MEDICAL CARE IF: °· Your symptoms last longer than 30 minutes. ° °SEEK IMMEDIATE MEDICAL CARE IF: °· You have any symptoms of heat stroke. These include: °? Fever. °? Vomiting. °? Red skin. °? Inability to sweat, resulting in hot, dry skin. °? Excessive thirst. °? Rapid breathing. °? Headache. °? Confusion or disorientation. °? Fainting. °? Seizures. °These symptoms may represent a serious problem that is an emergency. Do not wait to see if the symptoms will go away. Get medical help right away. Call your local emergency services (911 in the [place] Do not drive yourself to the hospital. °This information is not intended to replace advice given to you by your health care provider. Make sure you discuss any questions you have with your health care provider. °Document Released: 03/20/2008 Document Revised: 12/30/2015 Document Reviewed: 10/02/2015 °Elsevier Interactive Patient Education © 2018 [employer] ° °

## 2017-02-05 LAB — BAYER DCA HB A1C WAIVED: HB A1C (BAYER DCA - WAIVED): 6.5 % (ref <7.0)

## 2017-02-07 ENCOUNTER — Ambulatory Visit
Admission: RE | Admit: 2017-02-07 | Discharge: 2017-02-07 | Disposition: A | Discharge status: Home / Self Care | Payer: BLUE CROSS/BLUE SHIELD | Source: Ambulatory Visit | Attending: Family Medicine | Admitting: Family Medicine

## 2017-02-07 DIAGNOSIS — E041 Nontoxic single thyroid nodule: Secondary | ICD-10-CM | POA: Diagnosis not present

## 2017-02-13 LAB — HM DIABETES EYE EXAM

## 2017-02-27 ENCOUNTER — Ambulatory Visit (INDEPENDENT_AMBULATORY_CARE_PROVIDER_SITE_OTHER): Payer: BLUE CROSS/BLUE SHIELD | Admitting: Family Medicine

## 2017-02-27 ENCOUNTER — Encounter: Payer: Self-pay | Admitting: Family Medicine

## 2017-02-27 DIAGNOSIS — J309 Allergic rhinitis, unspecified: Secondary | ICD-10-CM | POA: Insufficient documentation

## 2017-02-27 MED ORDER — FLUTICASONE PROPIONATE 50 MCG/ACT NA SUSP: 2.0000 | Freq: Every day | NASAL | 11 refills | Status: DC

## 2017-02-27 MED ORDER — CETIRIZINE HCL 10 MG PO TABS: 10.0000 mg | ORAL_TABLET | Freq: Every day | ORAL | 11 refills | Status: DC

## 2017-02-27 MED ORDER — PREDNISONE 10 MG PO TABS: ORAL_TABLET | ORAL | 0 refills | Status: DC

## 2017-02-27 NOTE — Progress Notes (Signed)
   BP (!) 152/89   Pulse 69   Temp 98.1 F (36.7 C)   Wt 186 lb (84.4 kg)   SpO2 97%   BMI 34.02 kg/m    Subjective:    Patient ID: Monica Hull, female    DOB: 1961-02-23, 56 y.o.   MRN: [id number]  HPI: Monica Hull is a 56 y.o. female  Chief Complaint  Patient presents with  . URI    x 5 days, head congestion, non productive cough, sneezing, runny nose, some drainage, h/a, teeth pain. No fever. No sore throat.   Patient presents with 5 day hx of congestion, dry cough, sneezing, rhinorrhea, facial pain, and tooth pain. Denies fever, chills, sore throat, ear pain. Taking tylenol prn with no relief. No sick contacts noted.   Relevant past medical, surgical, family and social history reviewed and updated as indicated. Interim medical history since our last visit reviewed. Allergies and medications reviewed and updated.  Review of Systems  Constitutional: Negative.   HENT: Positive for congestion, rhinorrhea, sinus pain, sneezing and sore throat.   Eyes: Negative.   Respiratory: Positive for cough.   Gastrointestinal: Negative.   Musculoskeletal: Negative.   Neurological: Negative.   Psychiatric/Behavioral: Negative.    Per HPI unless specifically indicated above     Objective:    BP (!) 152/89   Pulse 69   Temp 98.1 F (36.7 C)   Wt 186 lb (84.4 kg)   SpO2 97%   BMI 34.02 kg/m   Wt Readings from Last 3 Encounters:  02/27/17 186 lb (84.4 kg)  02/04/17 187 lb (84.8 kg)  01/18/17 189 lb (85.7 kg)    Physical Exam  Constitutional: She is oriented to person, place, and time. She appears well-developed and well-nourished. No distress.  HENT:  Head: Atraumatic.  Eyes: Pupils are equal, round, and reactive to light. Conjunctivae are normal. No scleral icterus.  Neck: Normal range of motion. Neck supple.  Cardiovascular: Normal rate and normal heart sounds.   Pulmonary/Chest: Effort normal and breath sounds normal. No respiratory distress.    Musculoskeletal: Normal range of motion.  Neurological: She is alert and oriented to person, place, and time.  Skin: Skin is warm and dry.  Psychiatric: She has a normal mood and affect. Her behavior is normal.  Nursing note and vitals reviewed.     Assessment & Plan:   Problem List Items Addressed This Visit      Respiratory   Allergic rhinitis    Suspect her recurrent sinus issues are from uncontrolled allergies, has not been on a good regimen for years. Will start zyrtec and flonase, as well as prednisone, mucinex, and sinus rinses to help current flare. Continue good allergy regimen. F/u if worsening or no improvement          Follow up plan: Return if symptoms worsen or fail to improve.

## 2017-02-28 NOTE — Patient Instructions (Signed)
Follow up as needed

## 2017-02-28 NOTE — Assessment & Plan Note (Signed)
Suspect her recurrent sinus issues are from uncontrolled allergies, has not been on a good regimen for years. Will start zyrtec and flonase, as well as prednisone, mucinex, and sinus rinses to help current flare. Continue good allergy regimen. F/u if worsening or no improvement

## 2017-03-07 ENCOUNTER — Encounter: Payer: Self-pay | Admitting: Family Medicine

## 2017-03-07 NOTE — Telephone Encounter (Signed)
This encounter was created in error - please disregard.

## 2017-03-08 ENCOUNTER — Ambulatory Visit: Payer: Self-pay | Admitting: Pulmonary Disease

## 2017-03-27 ENCOUNTER — Encounter: Payer: Self-pay | Admitting: Family Medicine

## 2017-04-03 ENCOUNTER — Ambulatory Visit: Payer: BLUE CROSS/BLUE SHIELD | Admitting: Pulmonary Disease

## 2017-04-30 ENCOUNTER — Ambulatory Visit: Payer: BLUE CROSS/BLUE SHIELD | Admitting: Pulmonary Disease

## 2017-05-09 ENCOUNTER — Encounter: Payer: Self-pay | Admitting: Family Medicine

## 2017-05-09 ENCOUNTER — Ambulatory Visit: Payer: BLUE CROSS/BLUE SHIELD | Admitting: Family Medicine

## 2017-05-09 VITALS — BP 117/78 | HR 71 | Temp 97.8°F | Ht 62.1 in | Wt 184.6 lb

## 2017-05-09 DIAGNOSIS — Z23 Encounter for immunization: Secondary | ICD-10-CM

## 2017-05-09 DIAGNOSIS — E119 Type 2 diabetes mellitus without complications: Secondary | ICD-10-CM | POA: Diagnosis not present

## 2017-05-09 DIAGNOSIS — E782 Mixed hyperlipidemia: Secondary | ICD-10-CM | POA: Diagnosis not present

## 2017-05-09 DIAGNOSIS — Z0000 Encounter for general adult medical examination without abnormal findings: Secondary | ICD-10-CM | POA: Diagnosis not present

## 2017-05-09 DIAGNOSIS — K219 Gastro-esophageal reflux disease without esophagitis: Secondary | ICD-10-CM

## 2017-05-09 DIAGNOSIS — J438 Other emphysema: Secondary | ICD-10-CM

## 2017-05-09 DIAGNOSIS — Z720 Tobacco use: Secondary | ICD-10-CM | POA: Diagnosis not present

## 2017-05-09 DIAGNOSIS — J4530 Mild persistent asthma, uncomplicated: Secondary | ICD-10-CM

## 2017-05-09 DIAGNOSIS — E041 Nontoxic single thyroid nodule: Secondary | ICD-10-CM

## 2017-05-09 DIAGNOSIS — I10 Essential (primary) hypertension: Secondary | ICD-10-CM | POA: Diagnosis not present

## 2017-05-09 LAB — UA/M W/RFLX CULTURE, ROUTINE
Bilirubin, UA: NEGATIVE
GLUCOSE, UA: NEGATIVE
Ketones, UA: NEGATIVE
LEUKOCYTES UA: NEGATIVE
Nitrite, UA: NEGATIVE
PROTEIN UA: NEGATIVE
RBC, UA: NEGATIVE
Specific Gravity, UA: 1.02 (ref 1.005–1.030)
Urobilinogen, Ur: 1 mg/dL (ref 0.2–1.0)
pH, UA: 5.5 (ref 5.0–7.5)

## 2017-05-09 LAB — MICROALBUMIN, URINE WAIVED
CREATININE, URINE WAIVED: 300 mg/dL (ref 10–300)
MICROALB, UR WAIVED: 30 mg/L — AB (ref 0–19)
Microalb/Creat Ratio: <30 mg/g (ref <30)

## 2017-05-09 LAB — BAYER DCA HB A1C WAIVED: HB A1C (BAYER DCA - WAIVED): 6.1 % (ref <7.0)

## 2017-05-09 MED ORDER — FLUTICASONE PROPIONATE 50 MCG/ACT NA SUSP: 2.0000 | Freq: Every day | NASAL | 11 refills | Status: DC

## 2017-05-09 MED ORDER — ATORVASTATIN CALCIUM 20 MG PO TABS: 20.0000 mg | ORAL_TABLET | Freq: Every day | ORAL | 4 refills | Status: DC

## 2017-05-09 MED ORDER — BUDESONIDE-FORMOTEROL FUMARATE 160-4.5 MCG/ACT IN AERO: 2.0000 | INHALATION_SPRAY | Freq: Two times a day (BID) | RESPIRATORY_TRACT | 3 refills | Status: DC

## 2017-05-09 MED ORDER — CETIRIZINE HCL 10 MG PO TABS: 10.0000 mg | ORAL_TABLET | Freq: Every day | ORAL | 4 refills | Status: DC

## 2017-05-09 MED ORDER — LISINOPRIL 30 MG PO TABS: 30.0000 mg | ORAL_TABLET | Freq: Every day | ORAL | 1 refills | Status: DC

## 2017-05-09 MED ORDER — PANTOPRAZOLE SODIUM 40 MG PO TBEC: 40.0000 mg | DELAYED_RELEASE_TABLET | ORAL | 1 refills | Status: DC

## 2017-05-09 MED ORDER — ALBUTEROL SULFATE HFA 108 (90 BASE) MCG/ACT IN AERS: 2.0000 | INHALATION_SPRAY | Freq: Four times a day (QID) | RESPIRATORY_TRACT | 11 refills | Status: DC | PRN

## 2017-05-09 MED ORDER — ALBUTEROL SULFATE (2.5 MG/3ML) 0.083% IN NEBU: 2.5000 mg | INHALATION_SOLUTION | Freq: Four times a day (QID) | RESPIRATORY_TRACT | 1 refills | Status: DC | PRN

## 2017-05-09 MED ORDER — DICLOFENAC SODIUM 50 MG PO TBEC
50.0000 mg | DELAYED_RELEASE_TABLET | Freq: Four times a day (QID) | ORAL | 1 refills | Status: DC
Start: 2017-05-09 — End: 2017-11-19

## 2017-05-09 MED ORDER — FESOTERODINE FUMARATE ER 8 MG PO TB24: 8.0000 mg | ORAL_TABLET | Freq: Every day | ORAL | 3 refills | Status: DC

## 2017-05-09 NOTE — Assessment & Plan Note (Signed)
Under good control. Continue current regimen. Continue to monitor. Call with any concerns. 

## 2017-05-09 NOTE — Assessment & Plan Note (Signed)
Continue to work on cutting down. Call with any concerns.  

## 2017-05-09 NOTE — Assessment & Plan Note (Signed)
Sample of spiriva respimat given today. Will check in in 1 month to see how she's doing.

## 2017-05-09 NOTE — Patient Instructions (Signed)

## 2017-05-09 NOTE — Assessment & Plan Note (Signed)
Under good control with A1c of 6.1. Continue current regimen. Continue to monitor. Call with any concerns.  

## 2017-05-09 NOTE — Progress Notes (Signed)
BP 117/78   Pulse 71   Temp 97.8 F (36.6 C) (Oral)   Ht 5' 2.1" (1.577 m)   Wt 184 lb 9.6 oz (83.7 kg)   SpO2 95%   BMI 33.66 kg/m    Subjective:    Patient ID: Monica Hull, female    DOB: February 17, 1961, 56 y.o.   MRN: [id number]  HPI: Monica Hull is a 56 y.o. female presenting on 05/09/2017 for comprehensive medical examination. Current medical complaints include:  HYPERTENSION / HYPERLIPIDEMIA Satisfied with current treatment? no Duration of hypertension: chronic BP monitoring frequency: not checking BP medication side effects: no Duration of hyperlipidemia: chronic Cholesterol medication side effects: no Cholesterol supplements: none Past cholesterol medications: atorvastain (lipitor) Medication compliance: excellent compliance Aspirin: yes Recent stressors: no Recurrent headaches: no Visual changes: no Palpitations: no Dyspnea: no Chest pain: no Lower extremity edema: no Dizzy/lightheaded: no  DIABETES Hypoglycemic episodes:no Polydipsia/polyuria: no Visual disturbance: no Chest pain: no Paresthesias: no Glucose Monitoring: no Taking Insulin?: no Blood Pressure Monitoring: not checking Retinal Examination: Up to Date Foot Exam: Up to Date Diabetic Education: Completed Pneumovax: Up to Date Influenza: Up to Date Aspirin: yes  COPD COPD status: stable Satisfied with current treatment?: yes Oxygen use: no Dyspnea frequency: often Cough frequency: often Rescue inhaler frequency: daily  Limitation of activity: yes Productive cough: yes Pneumovax: Up to Date Influenza: Up to Date  GERD GERD control status: stable  Satisfied with current treatment? yes Heartburn frequency: rarely Medication side effects: no  Medication compliance: excellent Dysphagia: no Odynophagia:  no Hematemesis: no Blood in stool: no EGD: no   She currently lives with: husband Menopausal Symptoms: no  Depression Screen done today and results  listed below:  Dep[facility] 2[facility] 05/09/2017 02/29/2016 12/21/2014  Decreased Interest 0 0 1  Down, Depressed, Hopeless 0 0 0  PHQ - 2 Score 0 0 1  Altered sleeping 0 1 -  Tired, decreased energy 2 1 -  Change in appetite 0 0 -  Feeling bad or failure about yourself  0 0 -  Trouble concentrating 0 0 -  Moving slowly or fidgety/restless 0 0 -  Suicidal thoughts 0 0 -  PHQ-9 Score 2 2 -    Past Medical History:  Past Medical History:  Diagnosis Date  . Asthma   . Carpal tunnel syndrome   . Chronic kidney disease    H/O KIDNEY STONES  . COPD (chronic obstructive pulmonary disease) (HCC)   . Diabetes mellitus without complication (HCC)    DIET CONTROLLED  . Elevated liver function tests   . GERD (gastroesophageal reflux disease)   . Headache    MIGRAINES  . Hyperlipidemia   . Hypertension   . Impaired fasting glucose   . Sleep apnea    Not using CPAP- referred to sleep center  . Tobacco abuse     Surgical History:  Past Surgical History:  Procedure Laterality Date  . CARPAL TUNNEL RELEASE    . CESAREAN SECTION     X2  . HYSTEROSCOPY W/D&C N/A 07/11/2015   Procedure: DILATATION AND CURETTAGE /HYSTEROSCOPY;  Surgeon: Hildred LaserAnika Cherry, MD;  Location: [facility] ORS;  Service: Gynecology;  Laterality: N/A;  . PALATE / UVULA BIOPSY / EXCISION      Medications:  Current Outpatient Medications on File Prior to Visit  Medication Sig  . LORazepam (ATIVAN) 0.5 MG tablet Take 1 tablet (0.5 mg total) by mouth 2 (two) times daily as needed for anxiety.  Marland Kitchen. tiZANidine (  ZANAFLEX) 4 MG capsule Take 4 mg by mouth 3 (three) times daily as needed for muscle spasms. Reported on 07/11/2015   No current facility-administered medications on file prior to visit.     Allergies:  Allergies  Allergen Reactions  . Anoro Ellipta [Umeclidinium-Vilanterol] Rash    Causes patients tongue to break out  . Biaxin [Clarithromycin] Nausea And Vomiting and Rash    Social History:  Social History    Socioeconomic History  . Marital status: Married    Spouse name: Not on file  . Number of children: Not on file  . Years of education: Not on file  . Highest education level: Not on file  Social Needs  . Financial resource strain: Not on file  . Food insecurity - worry: Not on file  . Food insecurity - inability: Not on file  . Transportation needs - medical: Not on file  . Transportation needs - non-medical: Not on file  Occupational History  . Not on file  Tobacco Use  . Smoking status: Current Every Day Smoker    Packs/day: 0.25    Years: 39.00    Pack years: 9.75    Types: Cigarettes  . Smokeless tobacco: Never Used  Substance and Sexual Activity  . Alcohol use: No    Alcohol/week: 0.0 oz  . Drug use: No  . Sexual activity: Not Currently    Partners: Male  Other Topics Concern  . Not on file  Social History Narrative  . Not on file   Social History   Tobacco Use  Smoking Status Current Every Day Smoker  . Packs/day: 0.25  . Years: 39.00  . Pack years: 9.75  . Types: Cigarettes  Smokeless Tobacco Never Used   Social History   Substance and Sexual Activity  Alcohol Use No  . Alcohol/week: 0.0 oz    Family History:  Family History  Problem Relation Age of Onset  . Arthritis Mother   . Asthma Mother   . Diabetes Mother   . Heart disease Mother   . Hyperlipidemia Mother   . Hypertension Mother   . Kidney disease Mother   . Lung disease Mother   . Pancreatic cancer Mother   . Alcohol abuse Father   . Hypertension Father   . Hyperlipidemia Father   . Diabetes Brother   . Breast cancer Maternal Aunt 58  . Breast cancer Maternal Aunt 48    Past medical history, surgical history, medications, allergies, family history and social history reviewed with patient today and changes made to appropriate areas of the chart.   Review of Systems  Constitutional: Negative.   HENT: Negative.   Eyes: Negative.   Respiratory: Positive for cough. Negative for  hemoptysis, sputum production, shortness of breath and wheezing.   Cardiovascular: Negative.   Gastrointestinal: Negative for abdominal pain, blood in stool, constipation, diarrhea, heartburn, melena, nausea and vomiting.  Genitourinary: Positive for frequency. Negative for dysuria, flank pain, hematuria and urgency.  Musculoskeletal: Negative.   Skin: Negative.   Neurological: Negative.   Endo/Heme/Allergies: Positive for polydipsia. Negative for environmental allergies. Does not bruise/bleed easily.  Psychiatric/Behavioral: Negative.     All other ROS negative except what is listed above and in the HPI.      Objective:    BP 117/78   Pulse 71   Temp 97.8 F (36.6 C) (Oral)   Ht 5' 2.1" (1.577 m)   Wt 184 lb 9.6 oz (83.7 kg)   SpO2 95%   BMI 33.66  kg/m   Wt Readings from Last 3 Encounters:  05/09/17 184 lb 9.6 oz (83.7 kg)  02/27/17 186 lb (84.4 kg)  02/04/17 187 lb (84.8 kg)    Physical Exam  Constitutional: She is oriented to person, place, and time. She appears well-developed and well-nourished. No distress.  HENT:  Head: Normocephalic and atraumatic.  Right Ear: Hearing, tympanic membrane, external ear and ear canal normal.  Left Ear: Hearing, tympanic membrane, external ear and ear canal normal.  Nose: Nose normal.  Mouth/Throat: Uvula is midline, oropharynx is clear and moist and mucous membranes are normal. No oropharyngeal exudate.  Eyes: Conjunctivae, EOM and lids are normal. Pupils are equal, round, and reactive to light. Right eye exhibits no discharge. Left eye exhibits no discharge. No scleral icterus.  Neck: Normal range of motion. Neck supple. No JVD present. No tracheal deviation present. No thyromegaly present.  Cardiovascular: Normal rate, regular rhythm, normal heart sounds and intact distal pulses. Exam reveals no gallop and no friction rub.  No murmur heard. Pulmonary/Chest: Effort normal and breath sounds normal. No stridor. No respiratory distress.  She has no wheezes. She has no rales. She exhibits no tenderness. Right breast exhibits no inverted nipple, no mass, no nipple discharge, no skin change and no tenderness. Left breast exhibits no inverted nipple, no mass, no nipple discharge, no skin change and no tenderness. Breasts are symmetrical.  Abdominal: Soft. Bowel sounds are normal. She exhibits no distension and no mass. There is no tenderness. There is no rebound and no guarding.  Musculoskeletal: Normal range of motion. She exhibits no edema, tenderness or deformity.  Lymphadenopathy:    She has no cervical adenopathy.  Neurological: She is alert and oriented to person, place, and time. She has normal reflexes. She displays normal reflexes. No cranial nerve deficit. She exhibits normal muscle tone. Coordination normal.  Skin: Skin is warm, dry and intact. No rash noted. She is not diaphoretic. No erythema. No pallor.  Psychiatric: She has a normal mood and affect. Her speech is normal and behavior is normal. Judgment and thought content normal. Cognition and memory are normal.  Nursing note and vitals reviewed.   Results for orders placed or performed in visit on 05/09/17  Bayer DCA Hb A1c Waived  Result Value Ref Range   Bayer DCA Hb A1c Waived 6.1 <7.0 %  Microalbumin, Urine Waived  Result Value Ref Range   Microalb, Ur Waived 30 (H) 0 - 19 mg/L   Creatinine, Urine Waived 300 10 - 300 mg/dL   Microalb/Creat Ratio <30 <30 mg/g  UA/M w/rflx Culture, Routine  Result Value Ref Range   Specific Gravity, UA 1.020 1.005 - 1.030   pH, UA 5.5 5.0 - 7.5   Color, UA Orange Yellow   Appearance Ur Cloudy (A) Clear   Leukocytes, UA Negative Negative   Protein, UA Negative Negative/Trace   Glucose, UA Negative Negative   Ketones, UA Negative Negative   RBC, UA Negative Negative   Bilirubin, UA Negative Negative   Urobilinogen, Ur 1.0 0.2 - 1.0 mg/dL   Nitrite, UA Negative Negative      Assessment & Plan:   Problem List Items  Addressed This Visit      Cardiovascular and Mediastinum   Hypertension    Under good control. Continue current regimen. Continue to monitor. Call with any concerns.       Relevant Medications   atorvastatin (LIPITOR) 20 MG tablet   lisinopril (PRINIVIL,ZESTRIL) 30 MG tablet   Other Relevant  Orders   CBC with Differential/Platelet   Comprehensive metabolic panel   Microalbumin, Urine Waived (Completed)   UA/M w/rflx Culture, Routine (Completed)     Respiratory   Asthma    Sample of spiriva respimat given today. Will check in in 1 month to see how she's doing.       Relevant Medications   albuterol (PROVENTIL HFA;VENTOLIN HFA) 108 (90 Base) MCG/ACT inhaler   albuterol (PROVENTIL) (2.5 MG/3ML) 0.083% nebulizer solution   budesonide-formoterol (SYMBICORT) 160-4.5 MCG/ACT inhaler   Other Relevant Orders   CBC with Differential/Platelet   Comprehensive metabolic panel   UA/M w/rflx Culture, Routine (Completed)   COPD (chronic obstructive pulmonary disease) (HCC)    Sample of spiriva respimat given today. Will check in in 1 month to see how she's doing.       Relevant Medications   albuterol (PROVENTIL HFA;VENTOLIN HFA) 108 (90 Base) MCG/ACT inhaler   albuterol (PROVENTIL) (2.5 MG/3ML) 0.083% nebulizer solution   budesonide-formoterol (SYMBICORT) 160-4.5 MCG/ACT inhaler   cetirizine (ZYRTEC) 10 MG tablet   fluticasone (FLONASE) 50 MCG/ACT nasal spray   Other Relevant Orders   CBC with Differential/Platelet   Comprehensive metabolic panel   UA/M w/rflx Culture, Routine (Completed)     Digestive   GERD (gastroesophageal reflux disease)    Under good control. Continue current regimen. Continue to monitor. Call with any concerns.       Relevant Medications   pantoprazole (PROTONIX) 40 MG tablet   Other Relevant Orders   CBC with Differential/Platelet   Comprehensive metabolic panel   UA/M w/rflx Culture, Routine (Completed)     Endocrine   Thyroid nodule    Under good  control. Continue current regimen. Continue to monitor. Call with any concerns.       Relevant Orders   Comprehensive metabolic panel   TSH   UA/M w/rflx Culture, Routine (Completed)     Other   Hyperlipidemia    Under good control. Continue current regimen. Continue to monitor. Call with any concerns.       Relevant Medications   atorvastatin (LIPITOR) 20 MG tablet   lisinopril (PRINIVIL,ZESTRIL) 30 MG tablet   Other Relevant Orders   Comprehensive metabolic panel   Lipid Panel w/o Chol/HDL Ratio   UA/M w/rflx Culture, Routine (Completed)   Tobacco abuse    Continue to work on cutting down. Call with any concerns.       Relevant Orders   CBC with Differential/Platelet   Comprehensive metabolic panel   UA/M w/rflx Culture, Routine (Completed)   Diet-controlled diabetes mellitus (HCC)    Under good control with A1c of 6.1. Continue current regimen. Continue to monitor. Call with any concerns.       Relevant Medications   atorvastatin (LIPITOR) 20 MG tablet   lisinopril (PRINIVIL,ZESTRIL) 30 MG tablet   Other Relevant Orders   Bayer DCA Hb A1c Waived (Completed)   Comprehensive metabolic panel   Microalbumin, Urine Waived (Completed)   UA/M w/rflx Culture, Routine (Completed)    Other Visit Diagnoses    Routine general medical examination at a health care facility    -  Primary   Vaccines up to date. Screening labs checked today. Pap up to date. Mammogram and colonoscopy up to date. Call with any concerns.    Need for immunization against influenza       Flu shot given today.   Relevant Orders   Flu Vaccine QUAD 36+ mos IM (Completed)       Follow up  plan: Return in about 4 weeks (around 06/06/2017) for Follow up breathing.   LABORATORY TESTING:  - Pap smear: up to date  IMMUNIZATIONS:   - Tdap: Tetanus vaccination status reviewed: last tetanus booster within 10 years. - Influenza: Administered today - Pneumovax: Up to date - Prevnar: Not applicable -  Zostavax vaccine: Not applicable  SCREENING: -Mammogram: Up to date  - Colonoscopy: Up to date  - Bone Density: Not applicable    PATIENT COUNSELING:   Advised to take 1 mg of folate supplement per day if capable of pregnancy.   Sexuality: Discussed sexually transmitted diseases, partner selection, use of condoms, avoidance of unintended pregnancy  and contraceptive alternatives.   Advised to avoid cigarette smoking.  I discussed with the patient that most people either abstain from alcohol or drink within safe limits (<=14/week and <=4 drinks/occasion for males, <=7/weeks and <= 3 drinks/occasion for females) and that the risk for alcohol disorders and other health effects rises proportionally with the number of drinks per week and how often a drinker exceeds daily limits.  Discussed cessation/primary prevention of drug use and availability of treatment for abuse.   Diet: Encouraged to adjust caloric intake to maintain  or achieve ideal body weight, to reduce intake of dietary saturated fat and total fat, to limit sodium intake by avoiding high sodium foods and not adding table salt, and to maintain adequate dietary potassium and calcium preferably from fresh fruits, vegetables, and low-fat dairy products.    stressed the importance of regular exercise  Injury prevention: Discussed safety belts, safety helmets, smoke detector, smoking near bedding or upholstery.   Dental health: Discussed importance of regular tooth brushing, flossing, and dental visits.    NEXT PREVENTATIVE PHYSICAL DUE IN 1 YEAR. Return in about 4 weeks (around 06/06/2017) for Follow up breathing.

## 2017-05-09 NOTE — Assessment & Plan Note (Signed)
Sample of spiriva respimat given today. Will check in in 1 month to see how she's doing.  

## 2017-05-10 LAB — COMPREHENSIVE METABOLIC PANEL
ALBUMIN: 4.1 g/dL (ref 3.5–5.5)
ALT: 36 IU/L — ABNORMAL HIGH (ref 0–32)
AST: 28 IU/L (ref 0–40)
Albumin/Globulin Ratio: 1.4 (ref 1.2–2.2)
Alkaline Phosphatase: 122 IU/L — ABNORMAL HIGH (ref 39–117)
BUN / CREAT RATIO: 14 (ref 9–23)
BUN: 9 mg/dL (ref 6–24)
Bilirubin Total: 0.4 mg/dL (ref 0.0–1.2)
CALCIUM: 9.6 mg/dL (ref 8.7–10.2)
CO2: 23 mmol/L (ref 20–29)
CREATININE: 0.66 mg/dL (ref 0.57–1.00)
Chloride: 105 mmol/L (ref 96–106)
GFR calc Af Amer: 113 mL/min/{1.73_m2} (ref >59)
GFR, EST NON AFRICAN AMERICAN: 98 mL/min/{1.73_m2} (ref >59)
GLOBULIN, TOTAL: 2.9 g/dL (ref 1.5–4.5)
Glucose: 112 mg/dL — ABNORMAL HIGH (ref 65–99)
Potassium: 4.6 mmol/L (ref 3.5–5.2)
SODIUM: 146 mmol/L — AB (ref 134–144)
Total Protein: 7 g/dL (ref 6.0–8.5)

## 2017-05-10 LAB — CBC WITH DIFFERENTIAL/PLATELET
Basophils Absolute: 0.1 10*3/uL (ref 0.0–0.2)
Basos: 1 %
EOS (ABSOLUTE): 0.4 10*3/uL (ref 0.0–0.4)
EOS: 5 %
HEMATOCRIT: 47.2 % — AB (ref 34.0–46.6)
HEMOGLOBIN: 16.4 g/dL — AB (ref 11.1–15.9)
IMMATURE GRANULOCYTES: 0 %
Immature Grans (Abs): 0 10*3/uL (ref 0.0–0.1)
LYMPHS: 33 %
Lymphocytes Absolute: 2.6 10*3/uL (ref 0.7–3.1)
MCH: 31.4 pg (ref 26.6–33.0)
MCHC: 34.7 g/dL (ref 31.5–35.7)
MCV: 90 fL (ref 79–97)
MONOCYTES: 8 %
Monocytes Absolute: 0.6 10*3/uL (ref 0.1–0.9)
NEUTROS PCT: 53 %
Neutrophils Absolute: 4.3 10*3/uL (ref 1.4–7.0)
Platelets: 262 10*3/uL (ref 150–379)
RBC: 5.23 x10E6/uL (ref 3.77–5.28)
RDW: 13.8 % (ref 12.3–15.4)
WBC: 8 10*3/uL (ref 3.4–10.8)

## 2017-05-10 LAB — LIPID PANEL W/O CHOL/HDL RATIO
Cholesterol, Total: 185 mg/dL (ref 100–199)
HDL: 40 mg/dL (ref >39)
LDL Calculated: 117 mg/dL — ABNORMAL HIGH (ref 0–99)
TRIGLYCERIDES: 142 mg/dL (ref 0–149)
VLDL CHOLESTEROL CAL: 28 mg/dL (ref 5–40)

## 2017-05-10 LAB — TSH: TSH: 2.39 u[IU]/mL (ref 0.450–4.500)

## 2017-05-13 ENCOUNTER — Ambulatory Visit (INDEPENDENT_AMBULATORY_CARE_PROVIDER_SITE_OTHER): Payer: BLUE CROSS/BLUE SHIELD | Admitting: Family Medicine

## 2017-05-13 ENCOUNTER — Ambulatory Visit: Payer: Self-pay | Admitting: *Deleted

## 2017-05-13 ENCOUNTER — Encounter: Payer: Self-pay | Admitting: Family Medicine

## 2017-05-13 VITALS — BP 122/85 | HR 76 | Temp 98.2°F | Resp 16 | Ht 62.1 in | Wt 185.5 lb

## 2017-05-13 DIAGNOSIS — J438 Other emphysema: Secondary | ICD-10-CM | POA: Diagnosis not present

## 2017-05-13 DIAGNOSIS — Z720 Tobacco use: Secondary | ICD-10-CM | POA: Diagnosis not present

## 2017-05-13 MED ORDER — PREDNISONE 10 MG PO TABS: ORAL_TABLET | ORAL | 0 refills | Status: DC

## 2017-05-13 MED ORDER — BENZONATATE 200 MG PO CAPS: 200.0000 mg | ORAL_CAPSULE | Freq: Two times a day (BID) | ORAL | 0 refills | Status: DC | PRN

## 2017-05-13 NOTE — Telephone Encounter (Signed)
Pt   Reports   Non  Productive    Cough  Seems    Constant   She  Reports    A  History  Of  Copd   And  Asthma   And  Has  Been  Using  Her  Inhaler     Reason for Disposition . [1] Continuous (nonstop) coughing interferes with work or school AND [2] no improvement using cough treatment per protocol  Answer Assessment - Initial Assessment Questions 1. ONSET: "When did the cough begin?"       Cough  Began  2  Days   Ago   2. SEVERITY: "How bad is the cough today?"       Continued   Coughing  3. RESPIRATORY DISTRESS: "Describe your breathing."        Nose  Stopped    Slight shortness  Of  Breath    4. FEVER: "Do you have a fever?" If so, ask: "What is your temperature, how was it measured, and when did it start?"      No  none 5. HEMOPTYSIS: "Are you coughing up any blood?" If so ask: "How much?" (flecks, streaks, tablespoons, etc.)      No 6. TREATMENT: "What have you done so far to treat the cough?" (e.g., meds, fluids, humidifier)      Inhaler  No otc    7. CARDIAC HISTORY: "Do you have any history of heart disease?" (e.g., heart attack, congestive heart failure)       No 8. LUNG HISTORY: "Do you have any history of lung disease?"  (e.g., pulmonary embolus, asthma, emphysema)     Copd   Asthma   Sleep  apnea 9. PE RISK FACTORS: "Do you have a history of blood clots?" (or: recent major surgery, recent prolonged travel, bedridden )      No  10. OTHER SYMPTOMS: "Do you have any other symptoms? (e.g., runny nose, wheezing, chest pain)       Wheezing  And   Stuffy  nose 11. PREGNANCY: "Is there any chance you are pregnant?" "When was your last menstrual period?"        mENAPAUSE 12. TRAVEL: "Have you traveled out of the country in the last month?" (e.g., travel history, exposures)       No  Protocols used: COUGH - ACUTE NON-PRODUCTIVE-A-AH

## 2017-05-13 NOTE — Progress Notes (Signed)
BP 122/85   Pulse 76   Temp 98.2 F (36.8 C) (Oral)   Resp 16   Ht 5' 2.1" (1.577 m)   Wt 185 lb 8 oz (84.1 kg)   SpO2 96%   BMI 33.82 kg/m    Subjective:    Patient ID: Monica Hull, female    DOB: 1960/07/05, 56 y.o.   MRN: [id number]  HPI: Monica Hull is a 56 y.o. female  Chief Complaint  Patient presents with  . Cough    runny nose, sinus pressure but doesn't have chills or fever or bodyache onset 3 days   3 days of rhinorrhea, productive cough, facial pain and pressure, wheezing, SOB. Denies fevers, chills, body aches. Has been using regular home inhaler regimen with some relief. Multiple sick relatives lately. Long hx of COPD with frequent exacerbations.   Relevant past medical, surgical, family and social history reviewed and updated as indicated. Interim medical history since our last visit reviewed. Allergies and medications reviewed and updated.  Review of Systems  Constitutional: Negative.   HENT: Positive for congestion, rhinorrhea, sinus pressure and sinus pain.   Eyes: Negative.   Respiratory: Positive for cough, chest tightness, shortness of breath and wheezing.   Cardiovascular: Negative.   Gastrointestinal: Negative.   Genitourinary: Negative.   Musculoskeletal: Negative.   Neurological: Negative.   Psychiatric/Behavioral: Negative.    Per HPI unless specifically indicated above     Objective:    BP 122/85   Pulse 76   Temp 98.2 F (36.8 C) (Oral)   Resp 16   Ht 5' 2.1" (1.577 m)   Wt 185 lb 8 oz (84.1 kg)   SpO2 96%   BMI 33.82 kg/m   Wt Readings from Last 3 Encounters:  05/13/17 185 lb 8 oz (84.1 kg)  05/09/17 184 lb 9.6 oz (83.7 kg)  02/27/17 186 lb (84.4 kg)    Physical Exam  Constitutional: She is oriented to person, place, and time. She appears well-developed and well-nourished. No distress.  HENT:  Head: Atraumatic.  Eyes: Conjunctivae are normal. Pupils are equal, round, and reactive to light.  Neck:  Normal range of motion. Neck supple.  Cardiovascular: Normal rate and normal heart sounds.  Pulmonary/Chest: Effort normal. No respiratory distress. She has wheezes. She has no rales.  Musculoskeletal: Normal range of motion.  Lymphadenopathy:    She has no cervical adenopathy.  Neurological: She is alert and oriented to person, place, and time.  Skin: Skin is warm and dry. No rash noted.  Psychiatric: She has a normal mood and affect. Her behavior is normal.  Nursing note and vitals reviewed.     Assessment & Plan:   Problem List Items Addressed This Visit      Respiratory   COPD (chronic obstructive pulmonary disease) (HCC) - Primary    Exacerbation from viral URI. Will start prednisone taper and tessalon perles. Discussed mucinex and continued allergy/inhaler home regimen. F/u if no improvement over the next few days. Return precautions reviewed for fevers, worsening SOB, etc.       Relevant Medications   predniSONE (DELTASONE) 10 MG tablet   benzonatate (TESSALON) 200 MG capsule     Other   Tobacco abuse    Reiterated importance of smoking cessation, pt states she has been slowly trying to cut back but is not yet ready to quit. Reviewed options for if pt ever becomes ready. Pt will let us know.  Follow up plan: Return for as scheduled.

## 2017-05-16 NOTE — Assessment & Plan Note (Signed)
Reiterated importance of smoking cessation, pt states she has been slowly trying to cut back but is not yet ready to quit. Reviewed options for if pt ever becomes ready. Pt will let us know.

## 2017-05-16 NOTE — Assessment & Plan Note (Signed)
Exacerbation from viral URI. Will start prednisone taper and tessalon perles. Discussed mucinex and continued allergy/inhaler home regimen. F/u if no improvement over the next few days. Return precautions reviewed for fevers, worsening SOB, etc.

## 2017-05-16 NOTE — Patient Instructions (Signed)
Follow up as needed

## 2017-05-22 ENCOUNTER — Encounter: Payer: Self-pay | Admitting: Family Medicine

## 2017-05-23 ENCOUNTER — Other Ambulatory Visit: Payer: Self-pay | Admitting: Family Medicine

## 2017-05-23 MED ORDER — DOXYCYCLINE HYCLATE 100 MG PO TABS: 100.0000 mg | ORAL_TABLET | Freq: Two times a day (BID) | ORAL | 0 refills | Status: DC

## 2017-05-29 DIAGNOSIS — M4722 Other spondylosis with radiculopathy, cervical region: Secondary | ICD-10-CM | POA: Diagnosis not present

## 2017-05-29 DIAGNOSIS — Z720 Tobacco use: Secondary | ICD-10-CM | POA: Diagnosis not present

## 2017-05-29 DIAGNOSIS — M62838 Other muscle spasm: Secondary | ICD-10-CM | POA: Diagnosis not present

## 2017-06-04 ENCOUNTER — Ambulatory Visit: Payer: BLUE CROSS/BLUE SHIELD | Admitting: Pulmonary Disease

## 2017-06-12 ENCOUNTER — Ambulatory Visit (INDEPENDENT_AMBULATORY_CARE_PROVIDER_SITE_OTHER): Payer: BLUE CROSS/BLUE SHIELD | Admitting: Family Medicine

## 2017-06-12 ENCOUNTER — Encounter: Payer: Self-pay | Admitting: Family Medicine

## 2017-06-12 VITALS — BP 132/88 | HR 68 | Wt 185.3 lb

## 2017-06-12 DIAGNOSIS — J438 Other emphysema: Secondary | ICD-10-CM | POA: Diagnosis not present

## 2017-06-12 NOTE — Assessment & Plan Note (Signed)
Lungs clear. Up to date on her refills. Call with any concerns.

## 2017-06-12 NOTE — Progress Notes (Signed)
BP 132/88 (BP Location: Left Arm, Patient Position: Sitting, Cuff Size: Normal)   Pulse 68   Wt 185 lb 5 oz (84.1 kg)   SpO2 98%   BMI 33.79 kg/m    Subjective:    Patient ID: Monica Hull, female    DOB: [birth year], 56 y.o.   MRN: [id number]  HPI: Monica Hull is a 56 y.o. female  Chief Complaint  Patient presents with  . COPD   COPD- doing much better. Breathing feels good. No other concerns.  COPD status: better Satisfied with current treatment?: yes Oxygen use: no Dyspnea frequency: occasionally Cough frequency: occasionally Rescue inhaler frequency:  daily Limitation of activity: no Productive cough: no Pneumovax: Up to Date Influenza: Up to Date  Relevant past medical, surgical, family and social history reviewed and updated as indicated. Interim medical history since our last visit reviewed. Allergies and medications reviewed and updated.  Review of Systems  Constitutional: Negative.   Respiratory: Negative.   Cardiovascular: Negative.   Psychiatric/Behavioral: Negative.     Per HPI unless specifically indicated above     Objective:    BP 132/88 (BP Location: Left Arm, Patient Position: Sitting, Cuff Size: Normal)   Pulse 68   Wt 185 lb 5 oz (84.1 kg)   SpO2 98%   BMI 33.79 kg/m   Wt Readings from Last 3 Encounters:  06/12/17 185 lb 5 oz (84.1 kg)  05/13/17 185 lb 8 oz (84.1 kg)  05/09/17 184 lb 9.6 oz (83.7 kg)    Physical Exam  Constitutional: She is oriented to person, place, and time. She appears well-developed and well-nourished. No distress.  HENT:  Head: Normocephalic and atraumatic.  Right Ear: Hearing normal.  Left Ear: Hearing normal.  Nose: Nose normal.  Eyes: Conjunctivae and lids are normal. Right eye exhibits no discharge. Left eye exhibits no discharge. No scleral icterus.  Cardiovascular: Normal rate, regular rhythm, normal heart sounds and intact distal pulses. Exam reveals no gallop and no friction rub.    No murmur heard. Pulmonary/Chest: Effort normal and breath sounds normal. No respiratory distress. She has no wheezes. She has no rales. She exhibits no tenderness.  Musculoskeletal: Normal range of motion.  Neurological: She is alert and oriented to person, place, and time.  Skin: Skin is warm, dry and intact. No rash noted. She is not diaphoretic. No erythema. No pallor.  Psychiatric: She has a normal mood and affect. Her speech is normal and behavior is normal. Judgment and thought content normal. Cognition and memory are normal.  Nursing note and vitals reviewed.   Results for orders placed or performed in visit on 05/09/17  Bayer DCA Hb A1c Waived  Result Value Ref Range   Bayer DCA Hb A1c Waived 6.1 <7.0 %  CBC with Differential/Platelet  Result Value Ref Range   WBC 8.0 3.4 - 10.8 x10E3/uL   RBC 5.23 3.77 - 5.28 x10E6/uL   Hemoglobin 16.4 (H) 11.1 - 15.9 g/dL   Hematocrit 56.447.2 (H) 33.234.0 - 46.6 %   MCV 90 79 - 97 fL   MCH 31.4 26.6 - 33.0 pg   MCHC 34.7 31.5 - 35.7 g/dL   RDW 95.113.8 88.412.3 - 16.615.4 %   Platelets 262 150 - 379 x10E3/uL   Neutrophils 53 Not Estab. %   Lymphs 33 Not Estab. %   Monocytes 8 Not Estab. %   Eos 5 Not Estab. %   Basos 1 Not Estab. %   Neutrophils Absolute 4.3 1.4 -  7.0 x10E3/uL   Lymphocytes Absolute 2.6 0.7 - 3.1 x10E3/uL   Monocytes Absolute 0.6 0.1 - 0.9 x10E3/uL   EOS (ABSOLUTE) 0.4 0.0 - 0.4 x10E3/uL   Basophils Absolute 0.1 0.0 - 0.2 x10E3/uL   Immature Granulocytes 0 Not Estab. %   Immature Grans (Abs) 0.0 0.0 - 0.1 x10E3/uL  Comprehensive metabolic panel  Result Value Ref Range   Glucose 112 (H) 65 - 99 mg/dL   BUN 9 6 - 24 mg/dL   Creatinine, Ser 1.610.66 0.57 - 1.00 mg/dL   GFR calc non Af Amer 98 >59 mL/min/1.73   GFR calc Af Amer 113 >59 mL/min/1.73   BUN/Creatinine Ratio 14 9 - 23   Sodium 146 (H) 134 - 144 mmol/L   Potassium 4.6 3.5 - 5.2 mmol/L   Chloride 105 96 - 106 mmol/L   CO2 23 20 - 29 mmol/L   Calcium 9.6 8.7 - 10.2 mg/dL    Total Protein 7.0 6.0 - 8.5 g/dL   Albumin 4.1 3.5 - 5.5 g/dL   Globulin, Total 2.9 1.5 - 4.5 g/dL   Albumin/Globulin Ratio 1.4 1.2 - 2.2   Bilirubin Total 0.4 0.0 - 1.2 mg/dL   Alkaline Phosphatase 122 (H) 39 - 117 IU/L   AST 28 0 - 40 IU/L   ALT 36 (H) 0 - 32 IU/L  Lipid Panel w/o Chol/HDL Ratio  Result Value Ref Range   Cholesterol, Total 185 100 - 199 mg/dL   Triglycerides 096142 0 - 149 mg/dL   HDL 40 >04>39 mg/dL   VLDL Cholesterol Cal 28 5 - 40 mg/dL   LDL Calculated 540117 (H) 0 - 99 mg/dL  Microalbumin, Urine Waived  Result Value Ref Range   Microalb, Ur Waived 30 (H) 0 - 19 mg/L   Creatinine, Urine Waived 300 10 - 300 mg/dL   Microalb/Creat Ratio <30 <30 mg/g  TSH  Result Value Ref Range   TSH 2.390 0.450 - 4.500 uIU/mL  UA/M w/rflx Culture, Routine  Result Value Ref Range   Specific Gravity, UA 1.020 1.005 - 1.030   pH, UA 5.5 5.0 - 7.5   Color, UA Orange Yellow   Appearance Ur Cloudy (A) Clear   Leukocytes, UA Negative Negative   Protein, UA Negative Negative/Trace   Glucose, UA Negative Negative   Ketones, UA Negative Negative   RBC, UA Negative Negative   Bilirubin, UA Negative Negative   Urobilinogen, Ur 1.0 0.2 - 1.0 mg/dL   Nitrite, UA Negative Negative      Assessment & Plan:   Problem List Items Addressed This Visit      Respiratory   COPD (chronic obstructive pulmonary disease) (HCC) - Primary    Lungs clear. Up to date on her refills. Call with any concerns.           Follow up plan: Return February, for Follow up DM.

## 2017-06-14 LAB — BAYER DCA HB A1C WAIVED: HB A1C: 6.2 % (ref <7.0)

## 2017-07-01 DIAGNOSIS — M4722 Other spondylosis with radiculopathy, cervical region: Secondary | ICD-10-CM | POA: Diagnosis not present

## 2017-07-05 ENCOUNTER — Ambulatory Visit: Payer: BLUE CROSS/BLUE SHIELD | Admitting: Family Medicine

## 2017-07-05 ENCOUNTER — Encounter: Payer: Self-pay | Admitting: Family Medicine

## 2017-07-05 VITALS — BP 123/80 | HR 77 | Temp 97.8°F | Wt 187.6 lb

## 2017-07-05 DIAGNOSIS — J441 Chronic obstructive pulmonary disease with (acute) exacerbation: Secondary | ICD-10-CM

## 2017-07-05 MED ORDER — AZITHROMYCIN 250 MG PO TABS: ORAL_TABLET | ORAL | 0 refills | Status: DC

## 2017-07-05 MED ORDER — ALBUTEROL SULFATE (2.5 MG/3ML) 0.083% IN NEBU
2.5000 mg | INHALATION_SOLUTION | Freq: Once | RESPIRATORY_TRACT | Status: DC
Start: 2017-07-05 — End: 2017-08-19

## 2017-07-05 NOTE — Assessment & Plan Note (Signed)
Better following neb. Will treat with azithromycin and nebulizers. Recheck 2 weeks. Call with any concerns.

## 2017-07-05 NOTE — Progress Notes (Signed)
BP 123/80 (BP Location: Left Arm, Patient Position: Sitting, Cuff Size: Normal)   Pulse 77   Temp 97.8 F (36.6 C)   Wt 187 lb 9 oz (85.1 kg)   SpO2 95%   BMI 34.20 kg/m    Subjective:    Patient ID: [name], female    DOB: 10/31/1960, 57 y.o.   MRN: [id number]  HPI: Yessika Otte is a 57 y.o. female  Chief Complaint  Patient presents with  . Cough   UPPER RESPIRATORY TRACT INFECTION Duration: about a week Worst symptom: laryngitis Fever: no Cough: yes Shortness of breath: yes Wheezing: yes Chest pain: no Chest tightness: no Chest congestion: no Nasal congestion: yes Runny nose: no Post nasal drip: yes Sneezing: no Sore throat: no Swollen glands: no Sinus pressure: no Headache: yes Face pain: no Toothache: no Ear pain: no  Ear pressure: no  Eyes red/itching:no Eye drainage/crusting: no  Vomiting: no Rash: no Fatigue: yes Sick contacts: yes Strep contacts: no  Context: stable Recurrent sinusitis: no Relief with OTC cold/cough medications: no  Treatments attempted: inhalers   Relevant past medical, surgical, family and social history reviewed and updated as indicated. Interim medical history since our last visit reviewed. Allergies and medications reviewed and updated.  Review of Systems  Constitutional: Positive for fatigue. Negative for appetite change, chills, diaphoresis, fever and unexpected weight change.  HENT: Positive for congestion, postnasal drip, sore throat and voice change. Negative for dental problem, drooling, ear discharge, ear pain, facial swelling, hearing loss, mouth sores, nosebleeds, rhinorrhea, sinus pressure, sinus pain, sneezing, tinnitus and trouble swallowing.   Eyes: Negative.   Respiratory: Positive for cough, chest tightness, shortness of breath and wheezing. Negative for apnea, choking and stridor.   Cardiovascular: Negative.   Gastrointestinal: Negative.   Neurological: Negative.     Psychiatric/Behavioral: Negative.     Per HPI unless specifically indicated above     Objective:    BP 123/80 (BP Location: Left Arm, Patient Position: Sitting, Cuff Size: Normal)   Pulse 77   Temp 97.8 F (36.6 C)   Wt 187 lb 9 oz (85.1 kg)   SpO2 95%   BMI 34.20 kg/m   Wt Readings from Last 3 Encounters:  07/05/17 187 lb 9 oz (85.1 kg)  06/12/17 185 lb 5 oz (84.1 kg)  05/13/17 185 lb 8 oz (84.1 kg)    Physical Exam  Constitutional: She is oriented to person, place, and time. She appears well-developed and well-nourished. No distress.  HENT:  Head: Normocephalic and atraumatic.  Right Ear: Hearing, tympanic membrane, external ear and ear canal normal.  Left Ear: Hearing, tympanic membrane, external ear and ear canal normal.  Nose: Rhinorrhea present. No mucosal edema, nose lacerations, sinus tenderness, nasal deformity, septal deviation or nasal septal hematoma. No epistaxis.  No foreign bodies.  Mouth/Throat: Uvula is midline, oropharynx is clear and moist and mucous membranes are normal. No oropharyngeal exudate.  Eyes: Conjunctivae and lids are normal. Pupils are equal, round, and reactive to light. Right eye exhibits no discharge. Left eye exhibits no discharge. No scleral icterus.  Neck: Normal range of motion. Neck supple. No JVD present. No tracheal deviation present. No thyromegaly present.  Cardiovascular: Normal rate, regular rhythm, normal heart sounds and intact distal pulses. Exam reveals no gallop and no friction rub.  No murmur heard. Pulmonary/Chest: Effort normal. No stridor. No respiratory distress. She has wheezes in the right upper field, the right middle field, the right lower field, the left  upper field, the left middle field and the left lower field. She has rhonchi in the right lower field and the left lower field. She has no rales. She exhibits no tenderness.  Musculoskeletal: Normal range of motion.  Lymphadenopathy:    She has no cervical adenopathy.   Neurological: She is alert and oriented to person, place, and time.  Skin: Skin is warm, dry and intact. No rash noted. She is not diaphoretic. No erythema. No pallor.  Psychiatric: She has a normal mood and affect. Her speech is normal and behavior is normal. Judgment and thought content normal. Cognition and memory are normal.  Nursing note and vitals reviewed.   Results for orders placed or performed in visit on 05/09/17  Bayer DCA Hb A1c Waived  Result Value Ref Range   Bayer DCA Hb A1c Waived 6.1 <7.0 %  CBC with Differential/Platelet  Result Value Ref Range   WBC 8.0 3.4 - 10.8 x10E3/uL   RBC 5.23 3.77 - 5.28 x10E6/uL   Hemoglobin 16.4 (H) 11.1 - 15.9 g/dL   Hematocrit 16.1 (H) 09.6 - 46.6 %   MCV 90 79 - 97 fL   MCH 31.4 26.6 - 33.0 pg   MCHC 34.7 31.5 - 35.7 g/dL   RDW 04.5 40.9 - 81.1 %   Platelets 262 150 - 379 x10E3/uL   Neutrophils 53 Not Estab. %   Lymphs 33 Not Estab. %   Monocytes 8 Not Estab. %   Eos 5 Not Estab. %   Basos 1 Not Estab. %   Neutrophils Absolute 4.3 1.4 - 7.0 x10E3/uL   Lymphocytes Absolute 2.6 0.7 - 3.1 x10E3/uL   Monocytes Absolute 0.6 0.1 - 0.9 x10E3/uL   EOS (ABSOLUTE) 0.4 0.0 - 0.4 x10E3/uL   Basophils Absolute 0.1 0.0 - 0.2 x10E3/uL   Immature Granulocytes 0 Not Estab. %   Immature Grans (Abs) 0.0 0.0 - 0.1 x10E3/uL  Comprehensive metabolic panel  Result Value Ref Range   Glucose 112 (H) 65 - 99 mg/dL   BUN 9 6 - 24 mg/dL   Creatinine, Ser 9.14 0.57 - 1.00 mg/dL   GFR calc non Af Amer 98 >59 mL/min/1.73   GFR calc Af Amer 113 >59 mL/min/1.73   BUN/Creatinine Ratio 14 9 - 23   Sodium 146 (H) 134 - 144 mmol/L   Potassium 4.6 3.5 - 5.2 mmol/L   Chloride 105 96 - 106 mmol/L   CO2 23 20 - 29 mmol/L   Calcium 9.6 8.7 - 10.2 mg/dL   Total Protein 7.0 6.0 - 8.5 g/dL   Albumin 4.1 3.5 - 5.5 g/dL   Globulin, Total 2.9 1.5 - 4.5 g/dL   Albumin/Globulin Ratio 1.4 1.2 - 2.2   Bilirubin Total 0.4 0.0 - 1.2 mg/dL   Alkaline Phosphatase 122  (H) 39 - 117 IU/L   AST 28 0 - 40 IU/L   ALT 36 (H) 0 - 32 IU/L  Lipid Panel w/o Chol/HDL Ratio  Result Value Ref Range   Cholesterol, Total 185 100 - 199 mg/dL   Triglycerides 782 0 - 149 mg/dL   HDL 40 >95 mg/dL   VLDL Cholesterol Cal 28 5 - 40 mg/dL   LDL Calculated 621 (H) 0 - 99 mg/dL  Microalbumin, Urine Waived  Result Value Ref Range   Microalb, Ur Waived 30 (H) 0 - 19 mg/L   Creatinine, Urine Waived 300 10 - 300 mg/dL   Microalb/Creat Ratio <30 <30 mg/g  TSH  Result Value Ref Range  TSH 2.390 0.450 - 4.500 uIU/mL  UA/M w/rflx Culture, Routine  Result Value Ref Range   Specific Gravity, UA 1.020 1.005 - 1.030   pH, UA 5.5 5.0 - 7.5   Color, UA Orange Yellow   Appearance Ur Cloudy (A) Clear   Leukocytes, UA Negative Negative   Protein, UA Negative Negative/Trace   Glucose, UA Negative Negative   Ketones, UA Negative Negative   RBC, UA Negative Negative   Bilirubin, UA Negative Negative   Urobilinogen, Ur 1.0 0.2 - 1.0 mg/dL   Nitrite, UA Negative Negative      Assessment & Plan:   Problem List Items Addressed This Visit      Respiratory   COPD (chronic obstructive pulmonary disease) (HCC) - Primary    Better following neb. Will treat with azithromycin and nebulizers. Recheck 2 weeks. Call with any concerns.       Relevant Medications   albuterol (PROVENTIL) (2.5 MG/3ML) 0.083% nebulizer solution 2.5 mg   azithromycin (ZITHROMAX) 250 MG tablet       Follow up plan: Return 2 weeks, for lung recheck.

## 2017-07-25 DIAGNOSIS — M4722 Other spondylosis with radiculopathy, cervical region: Secondary | ICD-10-CM | POA: Diagnosis not present

## 2017-08-13 ENCOUNTER — Ambulatory Visit: Payer: BLUE CROSS/BLUE SHIELD | Admitting: Family Medicine

## 2017-08-19 ENCOUNTER — Ambulatory Visit (INDEPENDENT_AMBULATORY_CARE_PROVIDER_SITE_OTHER): Payer: BLUE CROSS/BLUE SHIELD | Admitting: Family Medicine

## 2017-08-19 ENCOUNTER — Encounter: Payer: Self-pay | Admitting: Family Medicine

## 2017-08-19 VITALS — BP 116/81 | HR 71 | Temp 97.7°F | Wt 191.5 lb

## 2017-08-19 DIAGNOSIS — E119 Type 2 diabetes mellitus without complications: Secondary | ICD-10-CM | POA: Diagnosis not present

## 2017-08-19 LAB — BAYER DCA HB A1C WAIVED: HB A1C (BAYER DCA - WAIVED): 6.2 % (ref <7.0)

## 2017-08-19 NOTE — Assessment & Plan Note (Addendum)
Under good control with A1c of 6.2. Continue diet and exercise. Call with any concerns. Continue to monitor.  

## 2017-08-19 NOTE — Progress Notes (Signed)
BP 116/81 (BP Location: Left Arm, Patient Position: Sitting, Cuff Size: Normal)   Pulse 71   Temp 97.7 F (36.5 C)   Wt 191 lb 8 oz (86.9 kg)   SpO2 98%   BMI 34.91 kg/m    Subjective:    Patient ID: Monica Hull, female    DOB: 1961/05/22, 57 y.o.   MRN: [id number]  HPI: Monica Hull is a 57 y.o. female  Chief Complaint  Patient presents with  . Diabetes   DIABETES Hypoglycemic episodes:no Polydipsia/polyuria: yes Visual disturbance: no Chest pain: no Paresthesias: no Glucose Monitoring: no  Accucheck frequency: Not Checking Taking Insulin?: no Blood Pressure Monitoring: not checking Retinal Examination: Up to Date Foot Exam: Up to Date Diabetic Education: Completed Pneumovax: Up to Date Influenza: Up to Date Aspirin: no   Relevant past medical, surgical, family and social history reviewed and updated as indicated. Interim medical history since our last visit reviewed. Allergies and medications reviewed and updated.  Review of Systems  Constitutional: Negative.   Respiratory: Negative.   Cardiovascular: Negative.   Psychiatric/Behavioral: Negative.     Per HPI unless specifically indicated above     Objective:    BP 116/81 (BP Location: Left Arm, Patient Position: Sitting, Cuff Size: Normal)   Pulse 71   Temp 97.7 F (36.5 C)   Wt 191 lb 8 oz (86.9 kg)   SpO2 98%   BMI 34.91 kg/m   Wt Readings from Last 3 Encounters:  08/19/17 191 lb 8 oz (86.9 kg)  07/05/17 187 lb 9 oz (85.1 kg)  06/12/17 185 lb 5 oz (84.1 kg)    Physical Exam  Constitutional: She is oriented to person, place, and time. She appears well-developed and well-nourished. No distress.  HENT:  Head: Normocephalic and atraumatic.  Right Ear: Hearing normal.  Left Ear: Hearing normal.  Nose: Nose normal.  Eyes: Conjunctivae and lids are normal. Right eye exhibits no discharge. Left eye exhibits no discharge. No scleral icterus.  Cardiovascular: Normal rate,  regular rhythm, normal heart sounds and intact distal pulses. Exam reveals no gallop and no friction rub.  No murmur heard. Pulmonary/Chest: Effort normal and breath sounds normal. No respiratory distress. She has no wheezes. She has no rales. She exhibits no tenderness.  Musculoskeletal: Normal range of motion.  Neurological: She is alert and oriented to person, place, and time.  Skin: Skin is warm, dry and intact. No rash noted. She is not diaphoretic. No erythema. No pallor.  Psychiatric: She has a normal mood and affect. Her speech is normal and behavior is normal. Judgment and thought content normal. Cognition and memory are normal.  Nursing note and vitals reviewed.   Results for orders placed or performed in visit on 05/09/17  Bayer DCA Hb A1c Waived  Result Value Ref Range   Bayer DCA Hb A1c Waived 6.1 <7.0 %  CBC with Differential/Platelet  Result Value Ref Range   WBC 8.0 3.4 - 10.8 x10E3/uL   RBC 5.23 3.77 - 5.28 x10E6/uL   Hemoglobin 16.4 (H) 11.1 - 15.9 g/dL   Hematocrit 84.647.2 (H) 96.234.0 - 46.6 %   MCV 90 79 - 97 fL   MCH 31.4 26.6 - 33.0 pg   MCHC 34.7 31.5 - 35.7 g/dL   RDW 95.213.8 84.112.3 - 32.415.4 %   Platelets 262 150 - 379 x10E3/uL   Neutrophils 53 Not Estab. %   Lymphs 33 Not Estab. %   Monocytes 8 Not Estab. %   Eos  5 Not Estab. %   Basos 1 Not Estab. %   Neutrophils Absolute 4.3 1.4 - 7.0 x10E3/uL   Lymphocytes Absolute 2.6 0.7 - 3.1 x10E3/uL   Monocytes Absolute 0.6 0.1 - 0.9 x10E3/uL   EOS (ABSOLUTE) 0.4 0.0 - 0.4 x10E3/uL   Basophils Absolute 0.1 0.0 - 0.2 x10E3/uL   Immature Granulocytes 0 Not Estab. %   Immature Grans (Abs) 0.0 0.0 - 0.1 x10E3/uL  Comprehensive metabolic panel  Result Value Ref Range   Glucose 112 (H) 65 - 99 mg/dL   BUN 9 6 - 24 mg/dL   Creatinine, Ser 1.61 0.57 - 1.00 mg/dL   GFR calc non Af Amer 98 >59 mL/min/1.73   GFR calc Af Amer 113 >59 mL/min/1.73   BUN/Creatinine Ratio 14 9 - 23   Sodium 146 (H) 134 - 144 mmol/L   Potassium 4.6  3.5 - 5.2 mmol/L   Chloride 105 96 - 106 mmol/L   CO2 23 20 - 29 mmol/L   Calcium 9.6 8.7 - 10.2 mg/dL   Total Protein 7.0 6.0 - 8.5 g/dL   Albumin 4.1 3.5 - 5.5 g/dL   Globulin, Total 2.9 1.5 - 4.5 g/dL   Albumin/Globulin Ratio 1.4 1.2 - 2.2   Bilirubin Total 0.4 0.0 - 1.2 mg/dL   Alkaline Phosphatase 122 (H) 39 - 117 IU/L   AST 28 0 - 40 IU/L   ALT 36 (H) 0 - 32 IU/L  Lipid Panel w/o Chol/HDL Ratio  Result Value Ref Range   Cholesterol, Total 185 100 - 199 mg/dL   Triglycerides 096 0 - 149 mg/dL   HDL 40 >04 mg/dL   VLDL Cholesterol Cal 28 5 - 40 mg/dL   LDL Calculated 540 (H) 0 - 99 mg/dL  Microalbumin, Urine Waived  Result Value Ref Range   Microalb, Ur Waived 30 (H) 0 - 19 mg/L   Creatinine, Urine Waived 300 10 - 300 mg/dL   Microalb/Creat Ratio <30 <30 mg/g  TSH  Result Value Ref Range   TSH 2.390 0.450 - 4.500 uIU/mL  UA/M w/rflx Culture, Routine  Result Value Ref Range   Specific Gravity, UA 1.020 1.005 - 1.030   pH, UA 5.5 5.0 - 7.5   Color, UA Orange Yellow   Appearance Ur Cloudy (A) Clear   Leukocytes, UA Negative Negative   Protein, UA Negative Negative/Trace   Glucose, UA Negative Negative   Ketones, UA Negative Negative   RBC, UA Negative Negative   Bilirubin, UA Negative Negative   Urobilinogen, Ur 1.0 0.2 - 1.0 mg/dL   Nitrite, UA Negative Negative      Assessment & Plan:   Problem List Items Addressed This Visit      Other   Diet-controlled diabetes mellitus (HCC) - Primary    Under good control with A1c of 6.2. Continue diet and exercise. Call with any concerns. Continue to monitor.       Relevant Orders   Bayer DCA Hb A1c Waived       Follow up plan: Return in about 3 months (around 11/16/2017) for 6 month follow up.

## 2017-08-21 DIAGNOSIS — Z6833 Body mass index (BMI) 33.0-33.9, adult: Secondary | ICD-10-CM | POA: Diagnosis not present

## 2017-08-21 DIAGNOSIS — M4722 Other spondylosis with radiculopathy, cervical region: Secondary | ICD-10-CM | POA: Diagnosis not present

## 2017-08-21 DIAGNOSIS — M62838 Other muscle spasm: Secondary | ICD-10-CM | POA: Diagnosis not present

## 2017-08-21 DIAGNOSIS — I10 Essential (primary) hypertension: Secondary | ICD-10-CM | POA: Diagnosis not present

## 2017-08-29 ENCOUNTER — Telehealth: Payer: Self-pay | Admitting: Family Medicine

## 2017-08-29 NOTE — Telephone Encounter (Signed)
Patient's husband Monica EdelsonDonald Pastrana stopped by for appt. And dropped off wife's handicap placard form. Informed patient that I will place in provider's mailbox to see. Had husband get wife to sign form as well due to needing a signature.   Please Advise.  Thank you

## 2017-10-02 ENCOUNTER — Encounter: Payer: Self-pay | Admitting: Family Medicine

## 2017-10-02 ENCOUNTER — Ambulatory Visit (INDEPENDENT_AMBULATORY_CARE_PROVIDER_SITE_OTHER): Payer: BLUE CROSS/BLUE SHIELD | Admitting: Family Medicine

## 2017-10-02 VITALS — BP 141/92 | HR 84 | Temp 98.1°F | Wt 186.4 lb

## 2017-10-02 DIAGNOSIS — J441 Chronic obstructive pulmonary disease with (acute) exacerbation: Secondary | ICD-10-CM

## 2017-10-02 MED ORDER — PREDNISONE 50 MG PO TABS: 50.0000 mg | ORAL_TABLET | Freq: Every day | ORAL | 0 refills | Status: DC

## 2017-10-02 NOTE — Assessment & Plan Note (Signed)
Will treat with prednisone taper. Recheck lungs in 2 weeks. Call if worsening.

## 2017-10-02 NOTE — Progress Notes (Signed)
BP (!) 141/92 (BP Location: Left Arm, Patient Position: Sitting, Cuff Size: Normal)   Pulse 84   Temp 98.1 F (36.7 C)   Wt 186 lb 7 oz (84.6 kg)   SpO2 97%   BMI 33.99 kg/m    Subjective:    Patient ID: Monica Hull, female    DOB: 1961-05-23, 57 y.o.   MRN: [id number]  HPI: Monica CoddingCynthia Horton Sobh is a 57 y.o. female  Chief Complaint  Patient presents with  . URI   UPPER RESPIRATORY TRACT INFECTION Duration: 3 days, worst last night Worst symptom: cough Fever: no Cough: yes Shortness of breath: yes Wheezing: yes Chest pain: yes, with cough Chest tightness: no Chest congestion: no Nasal congestion: no Runny nose: yes Post nasal drip: no Sneezing: yes Sore throat: no Swollen glands: no Sinus pressure: no Headache: yes Face pain: no Toothache: no Ear pain: no  Ear pressure: no  Eyes red/itching:no Eye drainage/crusting: no  Vomiting: no Rash: no Fatigue: yes Sick contacts: yes Strep contacts: no  Context: worse Recurrent sinusitis: no Relief with OTC cold/cough medications: no  Treatments attempted: none    Relevant past medical, surgical, family and social history reviewed and updated as indicated. Interim medical history since our last visit reviewed. Allergies and medications reviewed and updated.  Review of Systems  Constitutional: Negative.   HENT: Positive for congestion and rhinorrhea. Negative for dental problem, drooling, ear discharge, ear pain, facial swelling, hearing loss, mouth sores, nosebleeds, postnasal drip, sinus pressure, sinus pain, sneezing, sore throat, tinnitus, trouble swallowing and voice change.   Respiratory: Positive for cough, shortness of breath and wheezing. Negative for apnea, choking, chest tightness and stridor.   Cardiovascular: Negative.   Psychiatric/Behavioral: Negative.     Per HPI unless specifically indicated above     Objective:    BP (!) 141/92 (BP Location: Left Arm, Patient Position:  Sitting, Cuff Size: Normal)   Pulse 84   Temp 98.1 F (36.7 C)   Wt 186 lb 7 oz (84.6 kg)   SpO2 97%   BMI 33.99 kg/m   Wt Readings from Last 3 Encounters:  10/02/17 186 lb 7 oz (84.6 kg)  08/19/17 191 lb 8 oz (86.9 kg)  07/05/17 187 lb 9 oz (85.1 kg)    Physical Exam  Constitutional: She is oriented to person, place, and time. She appears well-developed and well-nourished. No distress.  HENT:  Head: Normocephalic and atraumatic.  Right Ear: Hearing normal.  Left Ear: Hearing normal.  Nose: Nose normal.  Eyes: Conjunctivae and lids are normal. Right eye exhibits no discharge. Left eye exhibits no discharge. No scleral icterus.  Cardiovascular: Normal rate, regular rhythm, normal heart sounds and intact distal pulses. Exam reveals no gallop and no friction rub.  No murmur heard. Pulmonary/Chest: Effort normal. No stridor. No respiratory distress. She has wheezes in the right upper field, the right middle field, the right lower field, the left upper field, the left middle field and the left lower field. She has no rales. She exhibits no tenderness.  Musculoskeletal: Normal range of motion.  Neurological: She is alert and oriented to person, place, and time.  Skin: Skin is warm, dry and intact. Capillary refill takes less than 2 seconds. No rash noted. She is not diaphoretic. No erythema. No pallor.  Psychiatric: She has a normal mood and affect. Her speech is normal and behavior is normal. Judgment and thought content normal. Cognition and memory are normal.    Results for orders placed  or performed in visit on 08/19/17  Bayer DCA Hb A1c Waived  Result Value Ref Range   Bayer DCA Hb A1c Waived 6.2 <7.0 %      Assessment & Plan:   Problem List Items Addressed This Visit      Respiratory   COPD (chronic obstructive pulmonary disease) (HCC) - Primary    Will treat with prednisone taper. Recheck lungs in 2 weeks. Call if worsening.       Relevant Medications   predniSONE  (DELTASONE) 50 MG tablet       Follow up plan: Return in about 2 weeks (around 10/16/2017) for lung recheck.

## 2017-10-04 ENCOUNTER — Ambulatory Visit: Payer: Self-pay | Admitting: *Deleted

## 2017-10-04 MED ORDER — AZITHROMYCIN 250 MG PO TABS: ORAL_TABLET | ORAL | 0 refills | Status: DC

## 2017-10-04 NOTE — Addendum Note (Signed)
Addended by: Dorcas CarrowJOHNSON, Jet Armbrust P on: 10/04/2017 10:05 AM   Modules accepted: Orders

## 2017-10-04 NOTE — Telephone Encounter (Signed)
Pt states not much better with cough and congestion after seeing her pcp 2 days ago. She denies fever and prod cough.  She is requesting an antibiotic. [facility] and spoke with Ephriam Knuckleshristian, and Dr. Laural BenesJohnson will contact the patient.  Pt advise of this and voices understanding. Will wait to hear back from Dr. Laural BenesJohnson  Reason for Disposition . Cough  Answer Assessment - Initial Assessment Questions 1. ONSET: "When did the cough begin?"      The first of the week 2. SEVERITY: "How bad is the cough today?"      severe 3. RESPIRATORY DISTRESS: "Describe your breathing."      Sometimes short of breath 4. FEVER: "Do you have a fever?" If so, ask: "What is your temperature, how was it measured, and when did it start?"     no 5. HEMOPTYSIS: "Are you coughing up any blood?" If so ask: "How much?" (flecks, streaks, tablespoons, etc.)     no 6. TREATMENT: "What have you done so far to treat the cough?" (e.g., meds, fluids, humidifier)     Medication for cold, fluids 7. CARDIAC HISTORY: "Do you have any history of heart disease?" (e.g., heart attack, congestive heart failure)      no 8. LUNG HISTORY: "Do you have any history of lung disease?"  (e.g., pulmonary embolus, asthma, emphysema)     Asthma and copd 9. PE RISK FACTORS: "Do you have a history of blood clots?" (or: recent major surgery, recent prolonged travel, bedridden )     no 10. OTHER SYMPTOMS: "Do you have any other symptoms? (e.g., runny nose, wheezing, chest pain)       Some wheezing, chest pressure when coughing 11. PREGNANCY: "Is there any chance you are pregnant?" "When was your last menstrual period?"       No, LMP 11 years ago 12. TRAVEL: "Have you traveled out of the country in the last month?" (e.g., travel history, exposures)       no  Protocols used: COUGH - ACUTE NON-PRODUCTIVE-A-AH

## 2017-10-04 NOTE — Telephone Encounter (Signed)
Rx sent to her pharmacy 

## 2017-10-16 ENCOUNTER — Ambulatory Visit: Payer: BLUE CROSS/BLUE SHIELD | Admitting: Family Medicine

## 2017-10-24 ENCOUNTER — Encounter: Payer: Self-pay | Admitting: Family Medicine

## 2017-10-24 ENCOUNTER — Ambulatory Visit (INDEPENDENT_AMBULATORY_CARE_PROVIDER_SITE_OTHER): Payer: BLUE CROSS/BLUE SHIELD | Admitting: Family Medicine

## 2017-10-24 VITALS — BP 115/79 | HR 73 | Temp 98.0°F | Wt 184.3 lb

## 2017-10-24 DIAGNOSIS — Z720 Tobacco use: Secondary | ICD-10-CM | POA: Diagnosis not present

## 2017-10-24 DIAGNOSIS — J441 Chronic obstructive pulmonary disease with (acute) exacerbation: Secondary | ICD-10-CM

## 2017-10-24 MED ORDER — VARENICLINE TARTRATE 1 MG PO TABS
1.0000 mg | ORAL_TABLET | Freq: Two times a day (BID) | ORAL | 2 refills | Status: DC
Start: 2017-10-24 — End: 2018-03-24

## 2017-10-24 MED ORDER — VARENICLINE TARTRATE 0.5 MG X 11 & 1 MG X 42 PO MISC
ORAL | 0 refills | Status: DC
Start: 2017-10-24 — End: 2018-03-24

## 2017-10-24 NOTE — Assessment & Plan Note (Signed)
Would like to quit. Will start chantix and recheck in 1 month. Call with any concerns.

## 2017-10-24 NOTE — Progress Notes (Signed)
BP 115/79 (BP Location: Left Arm, Patient Position: Sitting, Cuff Size: Normal)   Pulse 73   Temp 98 F (36.7 C)   Wt 184 lb 5 oz (83.6 kg)   SpO2 98%   BMI 33.60 kg/m    Subjective:    Patient ID: Monica Hull, female    DOB: Jun 28, 1960, 57 y.o.   MRN: [id number]  HPI: Monica Hull is a 57 y.o. female  Chief Complaint  Patient presents with  . lung recheck   Feeling much better. Still coughing, occasional wheezing. No fevers. No chills. Otherwise feeling well.   SMOKING CESSATION Smoking Status: current every day smoker Smoking Quit Date: Not set Smoking triggers: stress Type of tobacco use: cigarettes Children in the house: yes Other household members who smoke: no Treatments attempted: wellbutrin- had hallucinations, chantix Pneumovax: UTD   Relevant past medical, surgical, family and social history reviewed and updated as indicated. Interim medical history since our last visit reviewed. Allergies and medications reviewed and updated.  Review of Systems  Constitutional: Negative.   HENT: Negative.   Respiratory: Positive for cough, shortness of breath and wheezing. Negative for apnea, choking, chest tightness and stridor.   Cardiovascular: Negative.   Hematological: Negative.   Psychiatric/Behavioral: Negative.     Per HPI unless specifically indicated above     Objective:    BP 115/79 (BP Location: Left Arm, Patient Position: Sitting, Cuff Size: Normal)   Pulse 73   Temp 98 F (36.7 C)   Wt 184 lb 5 oz (83.6 kg)   SpO2 98%   BMI 33.60 kg/m   Wt Readings from Last 3 Encounters:  10/24/17 184 lb 5 oz (83.6 kg)  10/02/17 186 lb 7 oz (84.6 kg)  08/19/17 191 lb 8 oz (86.9 kg)    Physical Exam  Constitutional: She is oriented to person, place, and time. She appears well-developed and well-nourished. No distress.  HENT:  Head: Normocephalic and atraumatic.  Right Ear: Hearing normal.  Left Ear: Hearing normal.  Nose: Nose  normal.  Eyes: Conjunctivae and lids are normal. Right eye exhibits no discharge. Left eye exhibits no discharge. No scleral icterus.  Cardiovascular: Normal rate, regular rhythm, normal heart sounds and intact distal pulses. Exam reveals no gallop and no friction rub.  No murmur heard. Pulmonary/Chest: Effort normal. No stridor. No respiratory distress. She has wheezes. She has no rales. She exhibits no tenderness.  Musculoskeletal: Normal range of motion.  Neurological: She is alert and oriented to person, place, and time.  Skin: Skin is warm, dry and intact. Capillary refill takes less than 2 seconds. No rash noted. She is not diaphoretic. No erythema. No pallor.  Psychiatric: She has a normal mood and affect. Her speech is normal and behavior is normal. Judgment and thought content normal. Cognition and memory are normal.  Nursing note and vitals reviewed.   Results for orders placed or performed in visit on 08/19/17  Bayer DCA Hb A1c Waived  Result Value Ref Range   Bayer DCA Hb A1c Waived 6.2 <7.0 %      Assessment & Plan:   Problem List Items Addressed This Visit      Respiratory   COPD (chronic obstructive pulmonary disease) (HCC) - Primary   Relevant Medications   varenicline (CHANTIX STARTING MONTH PAK) 0.5 MG X 11 & 1 MG X 42 tablet   varenicline (CHANTIX CONTINUING MONTH PAK) 1 MG tablet     Other   Tobacco abuse  Would like to quit. Will start chantix and recheck in 1 month. Call with any concerns.           Follow up plan: Return in about 1 month (around 11/21/2017).

## 2017-11-18 NOTE — Progress Notes (Signed)
BP 105/73 (BP Location: Left Arm, Patient Position: Sitting, Cuff Size: Normal)   Pulse 71   Temp 97.7 F (36.5 C)   Wt 184 lb 2 oz (83.5 kg)   SpO2 97%   BMI 33.57 kg/m    Subjective:    Patient ID: Monica Hull, female    DOB: 17-Nov-1960, 57 y.o.   MRN: [id number]  HPI: Monica Hull is a 57 y.o. female  Chief Complaint  Patient presents with  . Diabetes  . Hyperlipidemia  . Hypertension  . Gastroesophageal Reflux   DIABETES Hypoglycemic episodes:no Polydipsia/polyuria: no Visual disturbance: no Chest pain: no Paresthesias: no Glucose Monitoring: no Taking Insulin?: no Blood Pressure Monitoring: not checking Retinal Examination: Up to Date Foot Exam: Done today Diabetic Education: Completed Pneumovax: Up to Date Influenza: Up to Date Aspirin: yes  HYPERTENSION / HYPERLIPIDEMIA Satisfied with current treatment? yes Duration of hypertension: chronic BP monitoring frequency: not checking BP medication side effects: no Past BP meds: lisinopril Duration of hyperlipidemia: chronic Cholesterol medication side effects: no Cholesterol supplements: none Past cholesterol medications: atorvastatin Medication compliance: excellent compliance Aspirin: no Recent stressors: no Recurrent headaches: no Visual changes: no Palpitations: no Dyspnea: no Chest pain: no Lower extremity edema: no Dizzy/lightheaded: no  GERD GERD control status: controlled  Satisfied with current treatment? yes Heartburn frequency: rarely Medication side effects: no  Medication compliance: excellent Dysphagia: no Odynophagia:  no Hematemesis: no Blood in stool: no EGD: no  COPD COPD status: controlled Satisfied with current treatment?: yes Oxygen use: no Dyspnea frequency: occasionally Cough frequency: occasionally Rescue inhaler frequency:  often Limitation of activity: no Productive cough: no Pneumovax: Up to Date Influenza: Up to Date  Relevant  past medical, surgical, family and social history reviewed and updated as indicated. Interim medical history since our last visit reviewed. Allergies and medications reviewed and updated.  Review of Systems  Constitutional: Negative.   Respiratory: Negative.   Cardiovascular: Negative.   Neurological: Negative.   Psychiatric/Behavioral: Negative.     Per HPI unless specifically indicated above     Objective:    BP 105/73 (BP Location: Left Arm, Patient Position: Sitting, Cuff Size: Normal)   Pulse 71   Temp 97.7 F (36.5 C)   Wt 184 lb 2 oz (83.5 kg)   SpO2 97%   BMI 33.57 kg/m   Wt Readings from Last 3 Encounters:  11/19/17 184 lb 2 oz (83.5 kg)  10/24/17 184 lb 5 oz (83.6 kg)  10/02/17 186 lb 7 oz (84.6 kg)    Physical Exam  Constitutional: She is oriented to person, place, and time. She appears well-developed and well-nourished. No distress.  HENT:  Head: Normocephalic and atraumatic.  Right Ear: Hearing normal.  Left Ear: Hearing normal.  Nose: Nose normal.  Eyes: Conjunctivae and lids are normal. Right eye exhibits no discharge. Left eye exhibits no discharge. No scleral icterus.  Cardiovascular: Normal rate, regular rhythm, normal heart sounds and intact distal pulses. Exam reveals no gallop and no friction rub.  No murmur heard. Pulmonary/Chest: Effort normal. No stridor. No respiratory distress. She has wheezes (bilateral bases- better than usual). She has no rales. She exhibits no tenderness.  Musculoskeletal: Normal range of motion.  Neurological: She is alert and oriented to person, place, and time.  Skin: Skin is warm, dry and intact. Capillary refill takes less than 2 seconds. No rash noted. She is not diaphoretic. No erythema. No pallor.  Psychiatric: She has a normal mood and affect. Her speech  is normal and behavior is normal. Judgment and thought content normal. Cognition and memory are normal.  Nursing note and vitals reviewed.   Results for orders  placed or performed in visit on 08/19/17  Bayer DCA Hb A1c Waived  Result Value Ref Range   HB A1C (BAYER DCA - WAIVED) 6.2 <7.0 %      Assessment & Plan:   Problem List Items Addressed This Visit      Cardiovascular and Mediastinum   Hypertension    Under good control. Continue current regimen. Continue to monitor. Call with any concerns. Refills given.       Relevant Medications   lisinopril (PRINIVIL,ZESTRIL) 30 MG tablet   atorvastatin (LIPITOR) 20 MG tablet   Other Relevant Orders   CBC with Differential/Platelet   Comprehensive metabolic panel     Respiratory   COPD (chronic obstructive pulmonary disease) (HCC)    Under good control. Continue current regimen. Continue to monitor. Call with any concerns. Refills given.       Relevant Medications   cetirizine (ZYRTEC) 10 MG tablet   budesonide-formoterol (SYMBICORT) 160-4.5 MCG/ACT inhaler   albuterol (PROVENTIL) (2.5 MG/3ML) 0.083% nebulizer solution   albuterol (PROVENTIL HFA;VENTOLIN HFA) 108 (90 Base) MCG/ACT inhaler   Other Relevant Orders   CBC with Differential/Platelet   Comprehensive metabolic panel     Digestive   GERD (gastroesophageal reflux disease)    Under good control. Continue current regimen. Continue to monitor. Call with any concerns. Refills given.       Relevant Medications   pantoprazole (PROTONIX) 40 MG tablet   Other Relevant Orders   CBC with Differential/Platelet   Comprehensive metabolic panel     Endocrine   Diet-controlled diabetes mellitus (HCC) - Primary    Under good control with a1c of 5.9. Continue current regimen. Continue to monitor. Call with any concerns.      Relevant Medications   lisinopril (PRINIVIL,ZESTRIL) 30 MG tablet   atorvastatin (LIPITOR) 20 MG tablet   Other Relevant Orders   Bayer DCA Hb A1c Waived   CBC with Differential/Platelet   Comprehensive metabolic panel     Other   Hyperlipidemia    Under good control. Continue current regimen. Continue to  monitor. Call with any concerns. Refills given.       Relevant Medications   lisinopril (PRINIVIL,ZESTRIL) 30 MG tablet   atorvastatin (LIPITOR) 20 MG tablet   Other Relevant Orders   CBC with Differential/Platelet   Comprehensive metabolic panel   Lipid Panel w/o Chol/HDL Ratio    Other Visit Diagnoses    Screening for breast cancer       Mammogram ordered today. Await results.    Relevant Orders   MM DIGITAL SCREENING BILATERAL       Follow up plan: Return in about 6 months (around 05/22/2018) for Physical.

## 2017-11-19 ENCOUNTER — Ambulatory Visit (INDEPENDENT_AMBULATORY_CARE_PROVIDER_SITE_OTHER): Payer: BLUE CROSS/BLUE SHIELD | Admitting: Family Medicine

## 2017-11-19 ENCOUNTER — Encounter: Payer: Self-pay | Admitting: Family Medicine

## 2017-11-19 VITALS — BP 105/73 | HR 71 | Temp 97.7°F | Wt 184.1 lb

## 2017-11-19 DIAGNOSIS — I10 Essential (primary) hypertension: Secondary | ICD-10-CM | POA: Diagnosis not present

## 2017-11-19 DIAGNOSIS — E782 Mixed hyperlipidemia: Secondary | ICD-10-CM | POA: Diagnosis not present

## 2017-11-19 DIAGNOSIS — E119 Type 2 diabetes mellitus without complications: Secondary | ICD-10-CM | POA: Diagnosis not present

## 2017-11-19 DIAGNOSIS — J441 Chronic obstructive pulmonary disease with (acute) exacerbation: Secondary | ICD-10-CM | POA: Diagnosis not present

## 2017-11-19 DIAGNOSIS — K219 Gastro-esophageal reflux disease without esophagitis: Secondary | ICD-10-CM | POA: Diagnosis not present

## 2017-11-19 DIAGNOSIS — Z1231 Encounter for screening mammogram for malignant neoplasm of breast: Secondary | ICD-10-CM | POA: Diagnosis not present

## 2017-11-19 DIAGNOSIS — Z1239 Encounter for other screening for malignant neoplasm of breast: Secondary | ICD-10-CM

## 2017-11-19 LAB — BAYER DCA HB A1C WAIVED: HB A1C (BAYER DCA - WAIVED): 5.9 % (ref <7.0)

## 2017-11-19 MED ORDER — PANTOPRAZOLE SODIUM 40 MG PO TBEC: 40.0000 mg | DELAYED_RELEASE_TABLET | ORAL | 1 refills | Status: DC

## 2017-11-19 MED ORDER — LISINOPRIL 30 MG PO TABS: 30.0000 mg | ORAL_TABLET | Freq: Every day | ORAL | 1 refills | Status: DC

## 2017-11-19 MED ORDER — ATORVASTATIN CALCIUM 20 MG PO TABS: 20.0000 mg | ORAL_TABLET | Freq: Every day | ORAL | 1 refills | Status: DC

## 2017-11-19 MED ORDER — BUDESONIDE-FORMOTEROL FUMARATE 160-4.5 MCG/ACT IN AERO
2.0000 | INHALATION_SPRAY | Freq: Two times a day (BID) | RESPIRATORY_TRACT | 3 refills | Status: DC
Start: 2017-11-19 — End: 2018-05-23

## 2017-11-19 MED ORDER — CETIRIZINE HCL 10 MG PO TABS: 10.0000 mg | ORAL_TABLET | Freq: Every day | ORAL | 4 refills | Status: DC

## 2017-11-19 MED ORDER — DICLOFENAC SODIUM 50 MG PO TBEC: 50.0000 mg | DELAYED_RELEASE_TABLET | Freq: Four times a day (QID) | ORAL | 1 refills | Status: DC

## 2017-11-19 MED ORDER — ALBUTEROL SULFATE HFA 108 (90 BASE) MCG/ACT IN AERS: 2.0000 | INHALATION_SPRAY | Freq: Four times a day (QID) | RESPIRATORY_TRACT | 11 refills | Status: DC | PRN

## 2017-11-19 MED ORDER — ALBUTEROL SULFATE (2.5 MG/3ML) 0.083% IN NEBU: 2.5000 mg | INHALATION_SOLUTION | Freq: Four times a day (QID) | RESPIRATORY_TRACT | 1 refills | Status: DC | PRN

## 2017-11-19 NOTE — Assessment & Plan Note (Signed)
Under good control. Continue current regimen. Continue to monitor. Call with any concerns. Refills given.  

## 2017-11-19 NOTE — Assessment & Plan Note (Addendum)
Under good control with a1c of 5.9. Continue current regimen. Continue to monitor. Call with any concerns.

## 2017-11-20 LAB — COMPREHENSIVE METABOLIC PANEL
ALK PHOS: 134 IU/L — AB (ref 39–117)
ALT: 43 IU/L — AB (ref 0–32)
AST: 34 IU/L (ref 0–40)
Albumin/Globulin Ratio: 1.4 (ref 1.2–2.2)
Albumin: 4.1 g/dL (ref 3.5–5.5)
BILIRUBIN TOTAL: 0.5 mg/dL (ref 0.0–1.2)
BUN/Creatinine Ratio: 13 (ref 9–23)
BUN: 13 mg/dL (ref 6–24)
CHLORIDE: 103 mmol/L (ref 96–106)
CO2: 23 mmol/L (ref 20–29)
Calcium: 9.3 mg/dL (ref 8.7–10.2)
Creatinine, Ser: 0.97 mg/dL (ref 0.57–1.00)
GFR calc Af Amer: 75 mL/min/{1.73_m2} (ref >59)
GFR calc non Af Amer: 65 mL/min/{1.73_m2} (ref >59)
Globulin, Total: 2.9 g/dL (ref 1.5–4.5)
Glucose: 158 mg/dL — ABNORMAL HIGH (ref 65–99)
POTASSIUM: 4.4 mmol/L (ref 3.5–5.2)
Sodium: 142 mmol/L (ref 134–144)
Total Protein: 7 g/dL (ref 6.0–8.5)

## 2017-11-20 LAB — CBC WITH DIFFERENTIAL/PLATELET
Basophils Absolute: 0.1 10*3/uL (ref 0.0–0.2)
Basos: 1 %
EOS (ABSOLUTE): 0.4 10*3/uL (ref 0.0–0.4)
Eos: 5 %
Hematocrit: 47.3 % — ABNORMAL HIGH (ref 34.0–46.6)
Hemoglobin: 15.9 g/dL (ref 11.1–15.9)
Immature Grans (Abs): 0 10*3/uL (ref 0.0–0.1)
Immature Granulocytes: 0 %
LYMPHS ABS: 2.3 10*3/uL (ref 0.7–3.1)
LYMPHS: 33 %
MCH: 30.6 pg (ref 26.6–33.0)
MCHC: 33.6 g/dL (ref 31.5–35.7)
MCV: 91 fL (ref 79–97)
MONOS ABS: 0.5 10*3/uL (ref 0.1–0.9)
Monocytes: 7 %
NEUTROS ABS: 3.7 10*3/uL (ref 1.4–7.0)
Neutrophils: 54 %
PLATELETS: 240 10*3/uL (ref 150–450)
RBC: 5.19 x10E6/uL (ref 3.77–5.28)
RDW: 13.7 % (ref 12.3–15.4)
WBC: 7 10*3/uL (ref 3.4–10.8)

## 2017-11-20 LAB — LIPID PANEL W/O CHOL/HDL RATIO
Cholesterol, Total: 188 mg/dL (ref 100–199)
HDL: 40 mg/dL (ref >39)
LDL Calculated: 124 mg/dL — ABNORMAL HIGH (ref 0–99)
Triglycerides: 119 mg/dL (ref 0–149)
VLDL CHOLESTEROL CAL: 24 mg/dL (ref 5–40)

## 2018-01-22 ENCOUNTER — Ambulatory Visit: Payer: Self-pay | Admitting: Family Medicine

## 2018-01-22 ENCOUNTER — Ambulatory Visit: Payer: Self-pay | Admitting: *Deleted

## 2018-01-22 ENCOUNTER — Encounter: Payer: Self-pay | Admitting: Unknown Physician Specialty

## 2018-01-22 ENCOUNTER — Ambulatory Visit: Payer: BLUE CROSS/BLUE SHIELD | Admitting: Unknown Physician Specialty

## 2018-01-22 VITALS — BP 149/89 | HR 61 | Temp 98.3°F | Ht 62.3 in | Wt 183.4 lb

## 2018-01-22 DIAGNOSIS — M79602 Pain in left arm: Secondary | ICD-10-CM | POA: Diagnosis not present

## 2018-01-22 DIAGNOSIS — R0789 Other chest pain: Secondary | ICD-10-CM

## 2018-01-22 DIAGNOSIS — R079 Chest pain, unspecified: Secondary | ICD-10-CM

## 2018-01-22 DIAGNOSIS — R071 Chest pain on breathing: Secondary | ICD-10-CM | POA: Diagnosis not present

## 2018-01-22 NOTE — Progress Notes (Signed)
BP (!) 149/89 (BP Location: Left Arm, Cuff Size: Normal)   Pulse 61   Temp 98.3 F (36.8 C) (Oral)   Ht 5' 2.3" (1.582 m)   Wt 183 lb 6.4 oz (83.2 kg)   SpO2 97%   BMI 33.22 kg/m    Subjective:    Patient ID: Monica Hull, female    DOB: 16-Aug-1960, 57 y.o.   MRN: [id number]  HPI: Monica CoddingCynthia Horton Haberer is a 57 y.o. female  Chief Complaint  Patient presents with  . Chest Pain    pt states she has been having pain on left side of chest since Monday night    Pt states she has had a cough with pain in her left chest area.  The chest pain is a stabbing pain which doesn't change no matter what activity.  Seemed to get worse with coughing this AM.  Cough is not unusual with COPD and  is better with breathing treatments.  Has seen Dr. Mariah MillingGollan in the past.     States if feels like the circulation is "cut off" in her left arm with a tight squeeze in upper arm.  This sensation comes and goes.  Wonders if it is related to opening and shutting the storage trailer the day before it started.    Relevant past medical, surgical, family and social history reviewed and updated as indicated. Interim medical history since our last visit reviewed. Allergies and medications reviewed and updated.  Review of Systems  Per HPI unless specifically indicated above     Objective:    BP (!) 149/89 (BP Location: Left Arm, Cuff Size: Normal)   Pulse 61   Temp 98.3 F (36.8 C) (Oral)   Ht 5' 2.3" (1.582 m)   Wt 183 lb 6.4 oz (83.2 kg)   SpO2 97%   BMI 33.22 kg/m   Wt Readings from Last 3 Encounters:  01/22/18 183 lb 6.4 oz (83.2 kg)  11/19/17 184 lb 2 oz (83.5 kg)  10/24/17 184 lb 5 oz (83.6 kg)    Physical Exam  Constitutional: She is oriented to person, place, and time. She appears well-developed and well-nourished. No distress.  HENT:  Head: Normocephalic and atraumatic.  Eyes: Conjunctivae and lids are normal. Right eye exhibits no discharge. Left eye exhibits no discharge. No  scleral icterus.  Neck: Normal range of motion. Neck supple. No JVD present. Carotid bruit is not present.  Cardiovascular: Normal rate, regular rhythm and normal heart sounds.  Pulmonary/Chest: Effort normal and breath sounds normal. She exhibits tenderness.  Reproducible tenderness left mid-chest wall  Abdominal: Normal appearance. There is no splenomegaly or hepatomegaly.  Musculoskeletal: Normal range of motion.  Neurological: She is alert and oriented to person, place, and time.  Skin: Skin is warm, dry and intact. No rash noted. No pallor.  Psychiatric: She has a normal mood and affect. Her behavior is normal. Judgment and thought content normal.   EKG NSR.  No STTW changes.    Results for orders placed or performed in visit on 11/19/17  Bayer DCA Hb A1c Waived  Result Value Ref Range   HB A1C (BAYER DCA - WAIVED) 5.9 <7.0 %  CBC with Differential/Platelet  Result Value Ref Range   WBC 7.0 3.4 - 10.8 x10E3/uL   RBC 5.19 3.77 - 5.28 x10E6/uL   Hemoglobin 15.9 11.1 - 15.9 g/dL   Hematocrit 40.947.3 (H) 81.134.0 - 46.6 %   MCV 91 79 - 97 fL   MCH 30.6 26.6 -  33.0 pg   MCHC 33.6 31.5 - 35.7 g/dL   RDW 16.1 09.6 - 04.5 %   Platelets 240 150 - 450 x10E3/uL   Neutrophils 54 Not Estab. %   Lymphs 33 Not Estab. %   Monocytes 7 Not Estab. %   Eos 5 Not Estab. %   Basos 1 Not Estab. %   Neutrophils Absolute 3.7 1.4 - 7.0 x10E3/uL   Lymphocytes Absolute 2.3 0.7 - 3.1 x10E3/uL   Monocytes Absolute 0.5 0.1 - 0.9 x10E3/uL   EOS (ABSOLUTE) 0.4 0.0 - 0.4 x10E3/uL   Basophils Absolute 0.1 0.0 - 0.2 x10E3/uL   Immature Granulocytes 0 Not Estab. %   Immature Grans (Abs) 0.0 0.0 - 0.1 x10E3/uL  Comprehensive metabolic panel  Result Value Ref Range   Glucose 158 (H) 65 - 99 mg/dL   BUN 13 6 - 24 mg/dL   Creatinine, Ser 4.09 0.57 - 1.00 mg/dL   GFR calc non Af Amer 65 >59 mL/min/1.73   GFR calc Af Amer 75 >59 mL/min/1.73   BUN/Creatinine Ratio 13 9 - 23   Sodium 142 134 - 144 mmol/L    Potassium 4.4 3.5 - 5.2 mmol/L   Chloride 103 96 - 106 mmol/L   CO2 23 20 - 29 mmol/L   Calcium 9.3 8.7 - 10.2 mg/dL   Total Protein 7.0 6.0 - 8.5 g/dL   Albumin 4.1 3.5 - 5.5 g/dL   Globulin, Total 2.9 1.5 - 4.5 g/dL   Albumin/Globulin Ratio 1.4 1.2 - 2.2   Bilirubin Total 0.5 0.0 - 1.2 mg/dL   Alkaline Phosphatase 134 (H) 39 - 117 IU/L   AST 34 0 - 40 IU/L   ALT 43 (H) 0 - 32 IU/L  Lipid Panel w/o Chol/HDL Ratio  Result Value Ref Range   Cholesterol, Total 188 100 - 199 mg/dL   Triglycerides 811 0 - 149 mg/dL   HDL 40 >91 mg/dL   VLDL Cholesterol Cal 24 5 - 40 mg/dL   LDL Calculated 478 (H) 0 - 99 mg/dL      Assessment & Plan:   Problem List Items Addressed This Visit      Unprioritized   Chest pain - Primary    Refer to Dr Mariah Milling due to high risk of CAD.  EKG is WNL today and symptoms consistent with chest wall pain.      Relevant Orders   EKG 12-Lead (Completed)   Ambulatory referral to Cardiology   Left arm pain    Consistent with rotator cuff.  Suspect due to overhead lifting.  Pt ed on care.  See handout       Other Visit Diagnoses    Costochondral chest pain       symptoms consistent with musculo-skeletal cause of pain.  Use ice or heat plus Tylenol for pain.  Take 1 ASA/day until she sees Dr. Mariah Milling   Relevant Orders   Ambulatory referral to Cardiology       Follow up plan: Return if symptoms worsen or fail to improve.

## 2018-01-22 NOTE — Assessment & Plan Note (Signed)
Refer to Dr Mariah MillingGollan due to high risk of CAD.  EKG is WNL today and symptoms consistent with chest wall pain.

## 2018-01-22 NOTE — Telephone Encounter (Signed)
Pt's husband called with his wife complaining of having a cough and some "light chest pain from it. Non productive cough or fever. States light wheezing. Has hx of COPD.  Has used the inhalers and nebulizer and has not helped.  No cardiac symptoms.  Appointment scheduled per pt request. [facility] route [facility].  Advised to call back for worsening symptoms.  Reason for Disposition . Cough has been present for > 3 weeks  Answer Assessment - Initial Assessment Questions 1. ONSET: "When did the cough begin?"      A month ago 2. SEVERITY: "How bad is the cough today?"      bad 3. RESPIRATORY DISTRESS: "Describe your breathing."      Always short of breath 4. FEVER: "Do you have a fever?" If so, ask: "What is your temperature, how was it measured, and when did it start?"     no 5. HEMOPTYSIS: "Are you coughing up any blood?" If so ask: "How much?" (flecks, streaks, tablespoons, etc.)     no 6. TREATMENT: "What have you done so far to treat the cough?" (e.g., meds, fluids, humidifier)     Meds, fluids 7. CARDIAC HISTORY: "Do you have any history of heart disease?" (e.g., heart attack, congestive heart failure)      no 8. LUNG HISTORY: "Do you have any history of lung disease?"  (e.g., pulmonary embolus, asthma, emphysema)     COPD 9. PE RISK FACTORS: "Do you have a history of blood clots?" (or: recent major surgery, recent prolonged travel, bedridden)     no 10. OTHER SYMPTOMS: "Do you have any other symptoms? (e.g., runny nose, wheezing, chest pain)       Chest pain, wheezing,  11. PREGNANCY: "Is there any chance you are pregnant?" "When was your last menstrual period?"       No periods 12. TRAVEL: "Have you traveled out of the country in the last month?" (e.g., travel history, exposures)       no  Protocols used: COUGH - ACUTE NON-PRODUCTIVE-A-AH

## 2018-01-22 NOTE — Assessment & Plan Note (Signed)
Consistent with rotator cuff.  Suspect due to overhead lifting.  Pt ed on care.  See handout

## 2018-01-22 NOTE — Patient Instructions (Signed)
Costochondritis Costochondritis is swelling and irritation (inflammation) of the tissue (cartilage) that connects your ribs to your breastbone (sternum). This causes pain in the front of your chest. Usually, the pain:  Starts gradually.  Is in more than one rib.  This condition usually goes away on its own over time. Follow these instructions at home:  Do not do anything that makes your pain worse.  If directed, put ice on the painful area: ? Put ice in a plastic bag. ? Place a towel between your skin and the bag. ? Leave the ice on for 20 minutes, 2-3 times a day.  If directed, put heat on the affected area as often as told by your doctor. Use the heat source that your doctor tells you to use, such as a moist heat pack or a heating pad. ? Place a towel between your skin and the heat source. ? Leave the heat on for 20-30 minutes. ? Take off the heat if your skin turns bright red. This is very important if you cannot feel pain, heat, or cold. You may have a greater risk of getting burned.  Take over-the-counter and prescription medicines only as told by your doctor.  Return to your normal activities as told by your doctor. Ask your doctor what activities are safe for you.  Keep all follow-up visits as told by your doctor. This is important. Contact a doctor if:  You have chills or a fever.  Your pain does not go away or it gets worse.  You have a cough that does not go away. Get help right away if:  You are short of breath. This information is not intended to replace advice given to you by your health care provider. Make sure you discuss any questions you have with your health care provider. Document Released: 11/28/2007 Document Revised: 12/30/2015 Document Reviewed: 10/05/2015 Elsevier Interactive Patient Education  2018 [employer]  Shoulder Pain Many things can cause shoulder pain, including:  An injury.  Moving the arm in the same way again and again  (overuse).  Joint pain (arthritis).  Follow these instructions at home: Take these actions to help with your pain:  Squeeze a soft ball or a foam pad as much as you can. This helps to prevent swelling. It also makes the arm stronger.  Take over-the-counter and prescription medicines only as told by your doctor.  If told, put ice on the area: ? Put ice in a plastic bag. ? Place a towel between your skin and the bag. ? Leave the ice on for 20 minutes, 2-3 times per day. Stop putting on ice if it does not help with the pain.  If you were given a shoulder sling or immobilizer: ? Wear it as told. ? Remove it to shower or bathe. ? Move your arm as little as possible. ? Keep your hand moving. This helps prevent swelling.  Contact a doctor if:  Your pain gets worse.  Medicine does not help your pain.  You have new pain in your arm, hand, or fingers. Get help right away if:  Your arm, hand, or fingers: ? Tingle. ? Are numb. ? Are swollen. ? Are painful. ? Turn white or blue. This information is not intended to replace advice given to you by your health care provider. Make sure you discuss any questions you have with your health care provider. Document Released: 11/28/2007 Document Revised: 02/05/2016 Document Reviewed: 10/04/2014 Elsevier Interactive Patient Education  [employer]2018 [employer]

## 2018-02-17 ENCOUNTER — Telehealth: Payer: Self-pay | Admitting: Family Medicine

## 2018-02-17 DIAGNOSIS — E041 Nontoxic single thyroid nodule: Secondary | ICD-10-CM

## 2018-02-17 NOTE — Telephone Encounter (Signed)
-----   Message from Dorcas CarrowMegan P Ardene Remley, DO sent at 02/11/2017  8:32 AM EDT ----- Needs follow up thyroid ultrasound

## 2018-02-17 NOTE — Progress Notes (Deleted)
Cardiology Office Note  Date:  02/17/2018   ID:  Monica CoddingCynthia Horton Hull, DOB March 13, 1961, MRN [id number]  PCP:  Dorcas CarrowJohnson, Megan P, DO   No chief complaint on file.   HPI:  [name] is a 57 year old woman with history of smoking,  GERD,  COPD,smoker 1 ppd  hypertension,  Previous episodes of chest pain and syncope dating back to 2000  who presents for symptoms of chest pain and syncope.   In follow-up today, she denies any recent episodes of chest pain, no recent episodes of syncope She has been having difficulty with COPD exacerbation, chronic infections  Recent CT scan of the head and neck reviewed with her Report details thyroid nodule, sinus disease, mastoid disease heavy carotid calcification on the right No significant atherosclerosis in the aortic arch  She reports having Leg cramps in the day, worse at night Trying to quit smoking, no regular exercise program She continues to smoke   tried Wellbutrin, considering Chantix, tried nicotine patches  Reports that she is tired in the daytime Husband reports that she is a heavy snorer No previous sleep study  Lab work reviewed with her showing total cholesterol around 200, LDL 1:15  EKG on today's visit shows normal sinus rhythm with rate 73 bpm, no significant ST or T-wave changes  Other past medical history rare episodes of syncope dating back to 2000 Symptoms typically present with nausea of uncertain etiology, followed by lightheadedness and syncope. Suspected to be vasovagal   typically passes out for 5-10 minutes at a time. Lips go blue, she is limp. Eventually she comes to.  GERD symptoms.  Had previous EGD   PMH:   has a past medical history of Asthma, Carpal tunnel syndrome, Chronic kidney disease, COPD (chronic obstructive pulmonary disease) (HCC), Diabetes mellitus without complication (HCC), Elevated liver function tests, GERD (gastroesophageal reflux disease), Headache, Hyperlipidemia, Hypertension,  Impaired fasting glucose, Sleep apnea, and Tobacco abuse.  PSH:    Past Surgical History:  Procedure Laterality Date  . CARPAL TUNNEL RELEASE    . CESAREAN SECTION     X2  . HYSTEROSCOPY W/D&C N/A 07/11/2015   Procedure: DILATATION AND CURETTAGE /HYSTEROSCOPY;  Surgeon: Hildred LaserAnika Cherry, MD;  Location: [facility] ORS;  Service: Gynecology;  Laterality: N/A;  . PALATE / UVULA BIOPSY / EXCISION      Current Outpatient Medications  Medication Sig Dispense Refill  . albuterol (PROVENTIL HFA;VENTOLIN HFA) 108 (90 Base) MCG/ACT inhaler Inhale 2 puffs into the lungs every 6 (six) hours as needed for wheezing or shortness of breath. 1 Inhaler 11  . albuterol (PROVENTIL) (2.5 MG/3ML) 0.083% nebulizer solution Take 3 mLs (2.5 mg total) by nebulization every 6 (six) hours as needed for wheezing or shortness of breath. 150 mL 1  . atorvastatin (LIPITOR) 20 MG tablet Take 1 tablet (20 mg total) by mouth daily. 90 tablet 1  . benzonatate (TESSALON) 200 MG capsule Take 1 capsule (200 mg total) 2 (two) times daily as needed by mouth for cough. 40 capsule 0  . budesonide-formoterol (SYMBICORT) 160-4.5 MCG/ACT inhaler Inhale 2 puffs into the lungs 2 (two) times daily. 3 Inhaler 3  . cetirizine (ZYRTEC) 10 MG tablet Take 1 tablet (10 mg total) by mouth daily. 90 tablet 4  . diclofenac (VOLTAREN) 50 MG EC tablet Take 1 tablet (50 mg total) by mouth 4 (four) times daily. 120 tablet 1  . fesoterodine (TOVIAZ) 8 MG TB24 tablet Take 1 tablet (8 mg total) daily by mouth. 90 tablet 3  .  fluticasone (FLONASE) 50 MCG/ACT nasal spray Place 2 sprays daily into both nostrils. 16 g 11  . lisinopril (PRINIVIL,ZESTRIL) 30 MG tablet Take 1 tablet (30 mg total) by mouth daily. 90 tablet 1  . LORazepam (ATIVAN) 0.5 MG tablet Take 1 tablet (0.5 mg total) by mouth 2 (two) times daily as needed for anxiety. 30 tablet 1  . pantoprazole (PROTONIX) 40 MG tablet Take 1 tablet (40 mg total) by mouth every morning. 90 tablet 1  . tiZANidine  (ZANAFLEX) 4 MG capsule Take 4 mg by mouth 3 (three) times daily as needed for muscle spasms. Reported on 07/11/2015    . varenicline (CHANTIX CONTINUING MONTH PAK) 1 MG tablet Take 1 tablet (1 mg total) by mouth 2 (two) times daily. (Patient not taking: Reported on 11/19/2017) 60 tablet 2  . varenicline (CHANTIX STARTING MONTH PAK) 0.5 MG X 11 & 1 MG X 42 tablet Take one 0.5 mg tab by mouth daily x3 days, then increase to one 0.5 mg tab BID x4 days, then increase to one 1 mg tab BID (Patient not taking: Reported on 11/19/2017) 53 tablet 0   No current facility-administered medications for this visit.      Allergies:   Anoro ellipta [umeclidinium-vilanterol] and Biaxin [clarithromycin]   Social History:  The patient  reports that she has been smoking cigarettes. She has a 9.75 pack-year smoking history. She has never used smokeless tobacco. She reports that she does not drink alcohol or use drugs.   Family History:   family history includes Alcohol abuse in her father; Arthritis in her mother; Asthma in her mother; Breast cancer (age of onset: 43) in her maternal aunt; Breast cancer (age of onset: 63) in her maternal aunt; Diabetes in her brother and mother; Heart disease in her mother; Hyperlipidemia in her father and mother; Hypertension in her father and mother; Kidney disease in her mother; Lung disease in her mother; Pancreatic cancer in her mother.    Review of Systems: Review of Systems  Constitutional: Negative.   Respiratory: Positive for cough and shortness of breath.   Cardiovascular: Negative.   Gastrointestinal: Negative.   Musculoskeletal: Negative.   Neurological: Negative.   Psychiatric/Behavioral: Negative.   All other systems reviewed and are negative.    PHYSICAL EXAM: VS:  There were no vitals taken for this visit. , BMI There is no height or weight on file to calculate BMI. GEN: Well nourished, well developed, in no acute distress  HEENT: normal  Neck: no JVD,  carotid bruits, or masses Cardiac: RRR; no murmurs, rubs, or gallops,no edema  Respiratory:  clear to auscultation bilaterally, normal work of breathing GI: soft, nontender, nondistended, + BS MS: no deformity or atrophy  Skin: warm and dry, no rash Neuro:  Strength and sensation are intact Psych: euthymic mood, full affect    Recent Labs: 05/09/2017: TSH 2.390 11/19/2017: ALT 43; BUN 13; Creatinine, Ser 0.97; Hemoglobin 15.9; Platelets 240; Potassium 4.4; Sodium 142    Lipid Panel Lab Results  Component Value Date   CHOL 188 11/19/2017   HDL 40 11/19/2017   LDLCALC 124 (H) 11/19/2017   TRIG 119 11/19/2017      Wt Readings from Last 3 Encounters:  01/22/18 183 lb 6.4 oz (83.2 kg)  11/19/17 184 lb 2 oz (83.5 kg)  10/24/17 184 lb 5 oz (83.6 kg)       ASSESSMENT AND PLAN:  Essential hypertension - Plan: EKG 12-Lead Blood pressure is well controlled on today's visit. No  changes made to the medications.  Carotid stenosis, right CT scan images reviewed in detail in the office with patient and husband Tobacco cessation recommended   emphysema (HCC) Smoking cessation recommended Followed by pulmonary, has follow-up next week Diet-controlled diabetes mellitus (HCC)  Obesity (BMI 30.0-34.9) We have encouraged continued exercise, careful diet management in an effort to lose weight.  Chest pain, unspecified chest pain type Denies having significant chest pain on today's visit Minimal aortic plaquing, smoking cessation recommended  Hyperlipidemia Recommended she start Lipitor 20 mg daily, goal LDL less than 70 given her carotid disease Would repeat lipid panel on routine follow-up with primary care   Total encounter time more than 25 minutes  Greater than 50% was spent in counseling and coordination of care with the patient   Disposition:   F/U  6 months   No orders of the defined types were placed in this encounter.    Signed, Dossie Arbour, M.D.,  Ph.D. 02/17/2018  [facility] [place], [place] [phone]  he

## 2018-02-17 NOTE — Telephone Encounter (Signed)
Message relayed to patient. Verbalized understanding and denied questions.   

## 2018-02-17 NOTE — Telephone Encounter (Signed)
Please let her know that she's due for her repeat thyroid US, so I've put the order in and they should be calling her. Thanks!

## 2018-02-18 ENCOUNTER — Ambulatory Visit: Payer: BLUE CROSS/BLUE SHIELD | Admitting: Cardiovascular Disease

## 2018-02-19 ENCOUNTER — Ambulatory Visit: Payer: BLUE CROSS/BLUE SHIELD | Admitting: Cardiovascular Disease

## 2018-02-20 ENCOUNTER — Ambulatory Visit
Admission: RE | Admit: 2018-02-20 | Discharge: 2018-02-20 | Disposition: A | Discharge status: Home / Self Care | Payer: BLUE CROSS/BLUE SHIELD | Source: Ambulatory Visit | Attending: Family Medicine | Admitting: Family Medicine

## 2018-02-20 DIAGNOSIS — E041 Nontoxic single thyroid nodule: Secondary | ICD-10-CM | POA: Diagnosis not present

## 2018-02-20 DIAGNOSIS — E042 Nontoxic multinodular goiter: Secondary | ICD-10-CM | POA: Insufficient documentation

## 2018-02-20 DIAGNOSIS — E010 Iodine-deficiency related diffuse (endemic) goiter: Secondary | ICD-10-CM | POA: Diagnosis not present

## 2018-02-21 ENCOUNTER — Ambulatory Visit: Payer: BLUE CROSS/BLUE SHIELD | Admitting: Cardiovascular Disease

## 2018-02-27 ENCOUNTER — Encounter: Payer: Self-pay | Admitting: Family Medicine

## 2018-03-23 NOTE — Progress Notes (Signed)
Cardiology Office Note  Date:  03/24/2018   ID:  Monica Hull, DOB 1961-05-12, MRN [id number]  PCP:  Dorcas Carrow, DO   Chief Complaint  Patient presents with  . OTHER    Chest pain. Meds reviewed verbally with pt.    HPI:  Monica Hull is a 57 year-old woman with history of smoking,  GERD,  COPD,  hypertension,  Smoking 1 ppd  heavy carotid calcification on the right Previous episodes of chest pain and syncope dating back to 2000  Stress test 2011, no ischemia who presents for symptoms of chest pain and syncope.   no recent episodes of syncope She has been having episodes of near syncope Usually comes on when she is aggravated, then she gets nauseous, she gets lightheaded  Previous COPD exacerbation, chronic infections This has been stable recently   CT scan of the head and neck  Report details thyroid nodule, sinus disease, mastoid disease  heavy carotid calcification on the right No significant atherosclerosis in the aortic arch  She reports having Leg cramps in the day, worse at night Trying to quit smoking, no regular exercise program She continues to smoke   tried Wellbutrin, uncertain if she tried Chantix, tried nicotine patches Still 1 pack/day   heavy snorer No previous sleep study  Lab work reviewed with her showing total cholesterol 188  EKG personally reviewed by myself on todays visit Shows normal sinus rhythm rate 64 bpm no significant ST or T wave changes  Other past medical history rare episodes of syncope dating back to 2000 Symptoms typically present with nausea of uncertain etiology, followed by lightheadedness and syncope. Suspected to be vasovagal   typically passes out for 5-10 minutes at a time. Lips go blue, she is limp. Eventually she comes to.  GERD symptoms.  Had previous EGD   PMH:   has a past medical history of Asthma, Carpal tunnel syndrome, Chronic kidney disease, COPD (chronic obstructive pulmonary disease)  (HCC), Diabetes mellitus without complication (HCC), Elevated liver function tests, GERD (gastroesophageal reflux disease), Headache, Hyperlipidemia, Hypertension, Impaired fasting glucose, Sleep apnea, and Tobacco abuse.  PSH:    Past Surgical History:  Procedure Laterality Date  . CARPAL TUNNEL RELEASE    . CESAREAN SECTION     X2  . HYSTEROSCOPY W/D&C N/A 07/11/2015   Procedure: DILATATION AND CURETTAGE /HYSTEROSCOPY;  Surgeon: Hildred Laser, MD;  Location: [facility] ORS;  Service: Gynecology;  Laterality: N/A;  . PALATE / UVULA BIOPSY / EXCISION      Current Outpatient Medications  Medication Sig Dispense Refill  . albuterol (PROVENTIL HFA;VENTOLIN HFA) 108 (90 Base) MCG/ACT inhaler Inhale 2 puffs into the lungs every 6 (six) hours as needed for wheezing or shortness of breath. 1 Inhaler 11  . albuterol (PROVENTIL) (2.5 MG/3ML) 0.083% nebulizer solution Take 3 mLs (2.5 mg total) by nebulization every 6 (six) hours as needed for wheezing or shortness of breath. 150 mL 1  . atorvastatin (LIPITOR) 20 MG tablet Take 1 tablet (20 mg total) by mouth daily. 90 tablet 1  . benzonatate (TESSALON) 200 MG capsule Take 1 capsule (200 mg total) 2 (two) times daily as needed by mouth for cough. 40 capsule 0  . budesonide-formoterol (SYMBICORT) 160-4.5 MCG/ACT inhaler Inhale 2 puffs into the lungs 2 (two) times daily. 3 Inhaler 3  . cetirizine (ZYRTEC) 10 MG tablet Take 1 tablet (10 mg total) by mouth daily. 90 tablet 4  . diclofenac (VOLTAREN) 50 MG EC tablet Take 1 tablet (  50 mg total) by mouth 4 (four) times daily. 120 tablet 1  . fesoterodine (TOVIAZ) 8 MG TB24 tablet Take 1 tablet (8 mg total) daily by mouth. 90 tablet 3  . fluticasone (FLONASE) 50 MCG/ACT nasal spray Place 2 sprays daily into both nostrils. 16 g 11  . lisinopril (PRINIVIL,ZESTRIL) 30 MG tablet Take 1 tablet (30 mg total) by mouth daily. 90 tablet 1  . LORazepam (ATIVAN) 0.5 MG tablet Take 1 tablet (0.5 mg total) by mouth 2 (two) times  daily as needed for anxiety. 30 tablet 1  . pantoprazole (PROTONIX) 40 MG tablet Take 1 tablet (40 mg total) by mouth every morning. 90 tablet 1  . tiZANidine (ZANAFLEX) 4 MG capsule Take 4 mg by mouth 3 (three) times daily as needed for muscle spasms. Reported on 07/11/2015    . ezetimibe (ZETIA) 10 MG tablet Take 1 tablet (10 mg total) by mouth daily. 30 tablet 11   No current facility-administered medications for this visit.      Allergies:   Anoro ellipta [umeclidinium-vilanterol] and Biaxin [clarithromycin]   Social History:  The patient  reports that she has been smoking cigarettes. She has a 58.50 pack-year smoking history. She has never used smokeless tobacco. She reports that she does not drink alcohol or use drugs.   Family History:   family history includes Alcohol abuse in her father; Arthritis in her mother; Asthma in her mother; Breast cancer (age of onset: 3) in her maternal aunt; Breast cancer (age of onset: 51) in her maternal aunt; Diabetes in her brother and mother; Heart disease in her mother; Hyperlipidemia in her father and mother; Hypertension in her father and mother; Kidney disease in her mother; Lung disease in her mother; Pancreatic cancer in her mother.    Review of Systems: Review of Systems  Constitutional: Negative.   Respiratory: Positive for shortness of breath.   Cardiovascular: Negative.   Gastrointestinal: Negative.   Musculoskeletal: Negative.   Neurological: Negative.   Psychiatric/Behavioral: Negative.   All other systems reviewed and are negative.    PHYSICAL EXAM: VS:  BP (!) 152/92 (BP Location: Left Arm, Patient Position: Sitting, Cuff Size: Normal)   Pulse 64   Ht 5\' 2"  (1.575 m)   Wt 186 lb 4 oz (84.5 kg)   BMI 34.07 kg/m  , BMI Body mass index is 34.07 kg/m. Constitutional:  oriented to person, place, and time. No distress. Obese HENT:  Head: Normocephalic and atraumatic.  Eyes:  no discharge. No scleral icterus.  Neck: Normal  range of motion. Neck supple. No JVD present.  Cardiovascular: Normal rate, regular rhythm, normal heart sounds and intact distal pulses. Exam reveals no gallop and no friction rub. No edema No murmur heard. Pulmonary/Chest: Wheezing, Rales throughout  no stridor. No respiratory distress.   Abdominal: Soft.  no distension.  no tenderness.  Musculoskeletal: Normal range of motion.  no  tenderness or deformity.  Neurological:  normal muscle tone. Coordination normal. No atrophy Skin: Skin is warm and dry. No rash noted. not diaphoretic.  Psychiatric:  normal mood and affect. behavior is normal. Thought content normal.    Recent Labs: 05/09/2017: TSH 2.390 11/19/2017: ALT 43; BUN 13; Creatinine, Ser 0.97; Hemoglobin 15.9; Platelets 240; Potassium 4.4; Sodium 142    Lipid Panel Lab Results  Component Value Date   CHOL 188 11/19/2017   HDL 40 11/19/2017   LDLCALC 124 (H) 11/19/2017   TRIG 119 11/19/2017      Wt Readings from Last  3 Encounters:  03/24/18 186 lb 4 oz (84.5 kg)  01/22/18 183 lb 6.4 oz (83.2 kg)  11/19/17 184 lb 2 oz (83.5 kg)       ASSESSMENT AND PLAN:  Essential hypertension - Plan: EKG 12-Lead Blood pressure elevated today but she is stressed Recommend she monitor blood pressures at home Given near syncope episodes possibly vasovagal I do not want to be too aggressive with her blood pressure She will call us with numbers  Carotid stenosis, right CT scan images from 2017 reviewed in detail in the office with patient and husband Tobacco cessation recommended We have ordered carotid ultrasound for further review   emphysema (HCC) Smoking cessation recommended Followed by pulmonary,  Again discussed with her  Diet-controlled diabetes mellitus (HCC) Unable to exercise Recommended low carbohydrate diet  Obesity (BMI 30.0-34.9) We have encouraged continued exercise, careful diet management in an effort to lose weight.  Chest pain, unspecified chest pain  type Denies having significant chest pain on today's visit Minimal aortic plaquing, smoking cessation recommended  No further testing ordered  Hyperlipidemia Recommended she continue Lipitor 20 mg daily,  Also will add Zetia 10 mg daily   Total encounter time more than 25 minutes  Greater than 50% was spent in counseling and coordination of care with the patient   Disposition:   F/U  12 months   Orders Placed This Encounter  Procedures  . EKG 12-Lead     Signed, Dossie Arbour, M.D., Ph.D. 03/24/2018  [facility] [place], [place] [phone]  he

## 2018-03-24 ENCOUNTER — Encounter: Payer: Self-pay | Admitting: Cardiovascular Disease

## 2018-03-24 ENCOUNTER — Encounter

## 2018-03-24 ENCOUNTER — Ambulatory Visit: Payer: BLUE CROSS/BLUE SHIELD | Admitting: Cardiovascular Disease

## 2018-03-24 VITALS — BP 152/92 | HR 64 | Ht 62.0 in | Wt 186.2 lb

## 2018-03-24 DIAGNOSIS — E782 Mixed hyperlipidemia: Secondary | ICD-10-CM

## 2018-03-24 DIAGNOSIS — I6521 Occlusion and stenosis of right carotid artery: Secondary | ICD-10-CM | POA: Diagnosis not present

## 2018-03-24 DIAGNOSIS — Z23 Encounter for immunization: Secondary | ICD-10-CM | POA: Diagnosis not present

## 2018-03-24 DIAGNOSIS — F17200 Nicotine dependence, unspecified, uncomplicated: Secondary | ICD-10-CM

## 2018-03-24 DIAGNOSIS — I10 Essential (primary) hypertension: Secondary | ICD-10-CM

## 2018-03-24 DIAGNOSIS — R55 Syncope and collapse: Secondary | ICD-10-CM | POA: Diagnosis not present

## 2018-03-24 DIAGNOSIS — J432 Centrilobular emphysema: Secondary | ICD-10-CM

## 2018-03-24 MED ORDER — EZETIMIBE 10 MG PO TABS: 10.0000 mg | ORAL_TABLET | Freq: Every day | ORAL | 11 refills | Status: DC

## 2018-03-24 NOTE — Patient Instructions (Addendum)
Flu shot  Call with blood pressure readings   Medication Instructions:   Please start zetia one a day for cholesterol Stay on lipitor 20 daily  Labwork:  No new labs needed  Testing/Procedures:  We will order a carotid u/s for stenosis seen on CT scan in 2017   Follow-Up: It was a pleasure seeing you in the office today. Please call us if you have new issues that need to be addressed before your next appt.  [phone]  Your physician wants you to follow-up in: 12 months.  You will receive a reminder letter in the mail two months in advance. If you don't receive a letter, please call our office to schedule the follow-up appointment.  If you need a refill on your cardiac medications before your next appointment, please call your pharmacy.  For educational health videos Log in to : [email or web address] Or : [email or web address], password : [email or web address]

## 2018-03-31 ENCOUNTER — Other Ambulatory Visit: Payer: Self-pay

## 2018-03-31 MED ORDER — EZETIMIBE 10 MG PO TABS: 10.0000 mg | ORAL_TABLET | Freq: Every day | ORAL | 3 refills | Status: DC

## 2018-04-02 ENCOUNTER — Other Ambulatory Visit: Payer: Self-pay

## 2018-04-02 ENCOUNTER — Telehealth: Payer: Self-pay

## 2018-04-02 NOTE — Telephone Encounter (Signed)
Prior Authorization sent for approval for Zetia 10 mg through Coverm[employer]eds.

## 2018-04-02 NOTE — Telephone Encounter (Signed)
Zetia approved today.   Questionnaire submitted. PA Case [id number] Status: Approved. Authorization and Notifications Completed.

## 2018-04-02 NOTE — Telephone Encounter (Signed)
Prior Authorization through covermymeds has been approved for Zetia. Patient was contacted of the approval.

## 2018-05-07 ENCOUNTER — Ambulatory Visit (INDEPENDENT_AMBULATORY_CARE_PROVIDER_SITE_OTHER): Payer: BLUE CROSS/BLUE SHIELD

## 2018-05-07 DIAGNOSIS — I6521 Occlusion and stenosis of right carotid artery: Secondary | ICD-10-CM

## 2018-05-23 ENCOUNTER — Encounter: Payer: Self-pay | Admitting: Family Medicine

## 2018-05-23 ENCOUNTER — Ambulatory Visit (INDEPENDENT_AMBULATORY_CARE_PROVIDER_SITE_OTHER): Payer: BLUE CROSS/BLUE SHIELD | Admitting: Family Medicine

## 2018-05-23 ENCOUNTER — Other Ambulatory Visit: Payer: Self-pay

## 2018-05-23 VITALS — BP 134/85 | HR 66 | Temp 97.8°F | Ht 62.7 in | Wt 189.0 lb

## 2018-05-23 DIAGNOSIS — E041 Nontoxic single thyroid nodule: Secondary | ICD-10-CM

## 2018-05-23 DIAGNOSIS — Z114 Encounter for screening for human immunodeficiency virus [HIV]: Secondary | ICD-10-CM | POA: Diagnosis not present

## 2018-05-23 DIAGNOSIS — J441 Chronic obstructive pulmonary disease with (acute) exacerbation: Secondary | ICD-10-CM | POA: Diagnosis not present

## 2018-05-23 DIAGNOSIS — Z0000 Encounter for general adult medical examination without abnormal findings: Secondary | ICD-10-CM

## 2018-05-23 DIAGNOSIS — E119 Type 2 diabetes mellitus without complications: Secondary | ICD-10-CM | POA: Diagnosis not present

## 2018-05-23 DIAGNOSIS — E782 Mixed hyperlipidemia: Secondary | ICD-10-CM

## 2018-05-23 DIAGNOSIS — I10 Essential (primary) hypertension: Secondary | ICD-10-CM

## 2018-05-23 DIAGNOSIS — L723 Sebaceous cyst: Secondary | ICD-10-CM

## 2018-05-23 DIAGNOSIS — K219 Gastro-esophageal reflux disease without esophagitis: Secondary | ICD-10-CM

## 2018-05-23 LAB — UA/M W/RFLX CULTURE, ROUTINE
Bilirubin, UA: NEGATIVE
Glucose, UA: NEGATIVE
KETONES UA: NEGATIVE
Leukocytes, UA: NEGATIVE
NITRITE UA: NEGATIVE
Protein, UA: NEGATIVE
RBC UA: NEGATIVE
SPEC GRAV UA: 1.01 (ref 1.005–1.030)
UUROB: 2 mg/dL — AB (ref 0.2–1.0)
pH, UA: 7 (ref 5.0–7.5)

## 2018-05-23 LAB — LIPID PANEL PICCOLO, WAIVED
Chol/HDL Ratio Piccolo,Waive: 4 mg/dL
Cholesterol Piccolo, Waived: 195 mg/dL (ref <200)
HDL CHOL PICCOLO, WAIVED: 48 mg/dL — AB (ref >59)
LDL CHOL CALC PICCOLO WAIVED: 110 mg/dL — AB (ref <100)
Triglycerides Piccolo,Waived: 183 mg/dL — ABNORMAL HIGH (ref <150)
VLDL Chol Calc Piccolo,Waive: 37 mg/dL — ABNORMAL HIGH (ref <30)

## 2018-05-23 LAB — MICROALBUMIN, URINE WAIVED
CREATININE, URINE WAIVED: 50 mg/dL (ref 10–300)
Microalb, Ur Waived: 10 mg/L (ref 0–19)

## 2018-05-23 LAB — BAYER DCA HB A1C WAIVED: HB A1C (BAYER DCA - WAIVED): 6 % (ref <7.0)

## 2018-05-23 MED ORDER — CETIRIZINE HCL 10 MG PO TABS: 10.0000 mg | ORAL_TABLET | Freq: Every day | ORAL | 4 refills | Status: DC

## 2018-05-23 MED ORDER — EZETIMIBE 10 MG PO TABS: 10.0000 mg | ORAL_TABLET | Freq: Every day | ORAL | 1 refills | Status: DC

## 2018-05-23 MED ORDER — FESOTERODINE FUMARATE ER 8 MG PO TB24: 8.0000 mg | ORAL_TABLET | Freq: Every day | ORAL | 3 refills | Status: DC

## 2018-05-23 MED ORDER — DICLOFENAC SODIUM 50 MG PO TBEC: 50.0000 mg | DELAYED_RELEASE_TABLET | Freq: Four times a day (QID) | ORAL | 6 refills | Status: DC

## 2018-05-23 MED ORDER — FLUTICASONE PROPIONATE 50 MCG/ACT NA SUSP: 2.0000 | Freq: Every day | NASAL | 11 refills | Status: DC

## 2018-05-23 MED ORDER — ALBUTEROL SULFATE (2.5 MG/3ML) 0.083% IN NEBU: 2.5000 mg | INHALATION_SOLUTION | Freq: Four times a day (QID) | RESPIRATORY_TRACT | 1 refills | Status: DC | PRN

## 2018-05-23 MED ORDER — BUDESONIDE-FORMOTEROL FUMARATE 160-4.5 MCG/ACT IN AERO: 2.0000 | INHALATION_SPRAY | Freq: Two times a day (BID) | RESPIRATORY_TRACT | 3 refills | Status: DC

## 2018-05-23 MED ORDER — ALBUTEROL SULFATE HFA 108 (90 BASE) MCG/ACT IN AERS: 2.0000 | INHALATION_SPRAY | Freq: Four times a day (QID) | RESPIRATORY_TRACT | 11 refills | Status: DC | PRN

## 2018-05-23 MED ORDER — ATORVASTATIN CALCIUM 20 MG PO TABS: 20.0000 mg | ORAL_TABLET | Freq: Every day | ORAL | 1 refills | Status: DC

## 2018-05-23 MED ORDER — LISINOPRIL 30 MG PO TABS: 30.0000 mg | ORAL_TABLET | Freq: Every day | ORAL | 1 refills | Status: DC

## 2018-05-23 MED ORDER — PANTOPRAZOLE SODIUM 40 MG PO TBEC: 40.0000 mg | DELAYED_RELEASE_TABLET | ORAL | 1 refills | Status: DC

## 2018-05-23 NOTE — Assessment & Plan Note (Signed)
Under good control on current regimen. Continue current regimen. Continue to monitor. Call with any concerns. Refills given. Checking labs today.  

## 2018-05-23 NOTE — Assessment & Plan Note (Addendum)
Under good control on current regime with A1c of 6.0. Continue current regimen. Continue to monitor. Call with any concerns. Refills given. Checking labs today.

## 2018-05-23 NOTE — Assessment & Plan Note (Signed)
Lungs wheezy today. Start albuterol. Continue symbicort. Call if not getting better or worse.

## 2018-05-23 NOTE — Progress Notes (Signed)
BP 134/85   Pulse 66   Temp 97.8 F (36.6 C) (Oral)   Ht 5' 2.7" (1.593 m)   Wt 189 lb (85.7 kg)   SpO2 96%   BMI 33.80 kg/m    Subjective:    Patient ID: Monica Hull, female    DOB: 1961-01-14, 57 y.o.   MRN: [id number]  HPI: Monica Hull is a 57 y.o. female presenting on 05/23/2018 for comprehensive medical examination. Current medical complaints include:  HYPERTENSION / HYPERLIPIDEMIA Satisfied with current treatment? yes Duration of hypertension: chronic BP monitoring frequency: not checking BP medication side effects: no Past BP meds: lisinopril Duration of hyperlipidemia: chronic Cholesterol medication side effects: no Cholesterol supplements: none Past cholesterol medications: atorvastatin Medication compliance: excellent compliance Aspirin: no Recent stressors: no Recurrent headaches: no Visual changes: no Palpitations: no Dyspnea: no Chest pain: no Lower extremity edema: no Dizzy/lightheaded: no  DIABETES Hypoglycemic episodes:no Polydipsia/polyuria: no Visual disturbance: no Chest pain: no Paresthesias: no Glucose Monitoring: no Taking Insulin?: no Blood Pressure Monitoring: not checking Retinal Examination: Not up to Date Foot Exam: Up to Date Diabetic Education: Completed Pneumovax: Up to Date Influenza: Up to Date Aspirin: no  GERD GERD control status: controlled  Satisfied with current treatment? yes Heartburn frequency:  More often Medication side effects: no  Medication compliance: excellent Dysphagia: no Odynophagia:  no Hematemesis: no Blood in stool: no EGD: no  COPD COPD status: stable Satisfied with current treatment?: yes Oxygen use: no Dyspnea frequency: walking around a lot Cough frequency: occasional Rescue inhaler frequency:  occasionally Limitation of activity: yes Productive cough: no Pneumovax: Up to Date Influenza: Up to Date  She currently lives with: husband Menopausal Symptoms:  no  Depression Screen done today and results listed below:  Depression screen [facility] 2/9 05/23/2018 05/09/2017 02/29/2016 12/21/2014  Decreased Interest 0 0 0 1  Down, Depressed, Hopeless 0 0 0 0  PHQ - 2 Score 0 0 0 1  Altered sleeping 3 0 1 -  Tired, decreased energy 3 2 1  -  Change in appetite 0 0 0 -  Feeling bad or failure about yourself  0 0 0 -  Trouble concentrating 0 0 0 -  Moving slowly or fidgety/restless 0 0 0 -  Suicidal thoughts 0 0 0 -  PHQ-9 Score 6 2 2  -  Difficult doing work/chores Somewhat difficult - - -    Past Medical History:  Past Medical History:  Diagnosis Date  . Asthma   . Carpal tunnel syndrome   . Chronic kidney disease    H/O KIDNEY STONES  . COPD (chronic obstructive pulmonary disease) (HCC)   . Diabetes mellitus without complication (HCC)    DIET CONTROLLED  . Elevated liver function tests   . GERD (gastroesophageal reflux disease)   . Headache    MIGRAINES  . Hyperlipidemia   . Hypertension   . Impaired fasting glucose   . Sleep apnea    Not using CPAP- referred to sleep center  . Tobacco abuse     Surgical History:  Past Surgical History:  Procedure Laterality Date  . CARPAL TUNNEL RELEASE    . CESAREAN SECTION     X2  . HYSTEROSCOPY W/D&C N/A 07/11/2015   Procedure: DILATATION AND CURETTAGE /HYSTEROSCOPY;  Surgeon: Hildred Laser, MD;  Location: [facility] ORS;  Service: Gynecology;  Laterality: N/A;  . PALATE / UVULA BIOPSY / EXCISION      Medications:  Current Outpatient Medications on File Prior to Visit  Medication  Sig  . tiZANidine (ZANAFLEX) 4 MG capsule Take 4 mg by mouth 3 (three) times daily as needed for muscle spasms. Reported on 07/11/2015   No current facility-administered medications on file prior to visit.     Allergies:  Allergies  Allergen Reactions  . Anoro Ellipta [Umeclidinium-Vilanterol] Rash    Causes patients tongue to break out  . Biaxin [Clarithromycin] Nausea And Vomiting and Rash    Social History:   Social History   Socioeconomic History  . Marital status: Married    Spouse name: Not on file  . Number of children: Not on file  . Years of education: Not on file  . Highest education level: Not on file  Occupational History  . Not on file  Social Needs  . Financial resource strain: Not on file  . Food insecurity:    Worry: Not on file    Inability: Not on file  . Transportation needs:    Medical: Not on file    Non-medical: Not on file  Tobacco Use  . Smoking status: Current Every Day Smoker    Packs/day: 1.50    Years: 39.00    Pack years: 58.50    Types: Cigarettes  . Smokeless tobacco: Never Used  Substance and Sexual Activity  . Alcohol use: No    Alcohol/week: 0.0 standard drinks  . Drug use: No  . Sexual activity: Not Currently    Partners: Male  Lifestyle  . Physical activity:    Days per week: Not on file    Minutes per session: Not on file  . Stress: Not on file  Relationships  . Social connections:    Talks on phone: Not on file    Gets together: Not on file    Attends religious service: Not on file    Active member of club or organization: Not on file    Attends meetings of clubs or organizations: Not on file    Relationship status: Not on file  . Intimate partner violence:    Fear of current or ex partner: Not on file    Emotionally abused: Not on file    Physically abused: Not on file    Forced sexual activity: Not on file  Other Topics Concern  . Not on file  Social History Narrative  . Not on file   Social History   Tobacco Use  Smoking Status Current Every Day Smoker  . Packs/day: 1.50  . Years: 39.00  . Pack years: 58.50  . Types: Cigarettes  Smokeless Tobacco Never Used   Social History   Substance and Sexual Activity  Alcohol Use No  . Alcohol/week: 0.0 standard drinks    Family History:  Family History  Problem Relation Age of Onset  . Arthritis Mother   . Asthma Mother   . Diabetes Mother   . Heart disease Mother    . Hyperlipidemia Mother   . Hypertension Mother   . Kidney disease Mother   . Lung disease Mother   . Pancreatic cancer Mother   . Alcohol abuse Father   . Hypertension Father   . Hyperlipidemia Father   . Diabetes Brother   . Breast cancer Maternal Aunt 58  . Breast cancer Maternal Aunt 48    Past medical history, surgical history, medications, allergies, family history and social history reviewed with patient today and changes made to appropriate areas of the chart.   Review of Systems  Constitutional: Negative.   HENT: Positive for hearing loss. Negative  for congestion, ear discharge, ear pain, nosebleeds, sinus pain, sore throat and tinnitus.   Eyes: Negative.   Respiratory: Positive for cough, shortness of breath and wheezing. Negative for hemoptysis, sputum production and stridor.   Cardiovascular: Negative.   Gastrointestinal: Positive for blood in stool, constipation and nausea. Negative for abdominal pain, diarrhea, heartburn, melena and vomiting.  Genitourinary: Negative.   Musculoskeletal: Positive for myalgias. Negative for back pain, falls, joint pain and neck pain.  Skin: Negative.        Spot under her arm  Neurological: Positive for tingling. Negative for dizziness, tremors, sensory change, speech change, focal weakness, seizures, loss of consciousness, weakness and headaches.  Endo/Heme/Allergies: Positive for polydipsia. Negative for environmental allergies. Does not bruise/bleed easily.  Psychiatric/Behavioral: Negative.     All other ROS negative except what is listed above and in the HPI.      Objective:    BP 134/85   Pulse 66   Temp 97.8 F (36.6 C) (Oral)   Ht 5' 2.7" (1.593 m)   Wt 189 lb (85.7 kg)   SpO2 96%   BMI 33.80 kg/m   Wt Readings from Last 3 Encounters:  05/23/18 189 lb (85.7 kg)  03/24/18 186 lb 4 oz (84.5 kg)  01/22/18 183 lb 6.4 oz (83.2 kg)    Physical Exam  Constitutional: She is oriented to person, place, and time. She  appears well-developed and well-nourished. No distress.  HENT:  Head: Normocephalic and atraumatic.  Right Ear: Hearing, tympanic membrane, external ear and ear canal normal.  Left Ear: Hearing, tympanic membrane, external ear and ear canal normal.  Nose: Nose normal.  Mouth/Throat: Uvula is midline, oropharynx is clear and moist and mucous membranes are normal. No oropharyngeal exudate.  Eyes: Pupils are equal, round, and reactive to light. Conjunctivae, EOM and lids are normal. Right eye exhibits no discharge. Left eye exhibits no discharge. No scleral icterus.  Neck: Normal range of motion. Neck supple. No JVD present. No tracheal deviation present. No thyromegaly present.  Cardiovascular: Normal rate, regular rhythm, normal heart sounds and intact distal pulses. Exam reveals no gallop and no friction rub.  No murmur heard. Pulmonary/Chest: Effort normal. No stridor. No respiratory distress. She has wheezes. She has no rales. She exhibits no tenderness. Right breast exhibits no inverted nipple, no mass, no nipple discharge, no skin change and no tenderness. Left breast exhibits no inverted nipple, no mass, no nipple discharge, no skin change and no tenderness. No breast swelling, tenderness, discharge or bleeding. Breasts are symmetrical.  Abdominal: Soft. Bowel sounds are normal. She exhibits no distension and no mass. There is no tenderness. There is no rebound and no guarding. No hernia.  Genitourinary:  Genitourinary Comments: Pelvic exam deferred with shared decision making  Musculoskeletal: Normal range of motion. She exhibits no edema, tenderness or deformity.  Lymphadenopathy:    She has no cervical adenopathy.  Neurological: She is alert and oriented to person, place, and time. She displays normal reflexes. No cranial nerve deficit or sensory deficit. She exhibits normal muscle tone. Coordination normal.  Skin: Skin is warm, dry and intact. Capillary refill takes less than 2 seconds.  No rash noted. She is not diaphoretic. No erythema. No pallor.  1 inch erythematous cyst under R arm, not fluctuant, not hot  Psychiatric: She has a normal mood and affect. Her speech is normal and behavior is normal. Judgment and thought content normal. Cognition and memory are normal.  Nursing note and vitals reviewed. Breast exam  done today with Myrtha MantisKeri Bullock, CMA in attendance.   Results for orders placed or performed in visit on 02/27/18  HM PAP SMEAR  Result Value Ref Range   HM Pap smear HPV negative- see scanned document       Assessment & Plan:   Problem List Items Addressed This Visit      Cardiovascular and Mediastinum   Hypertension    Under good control on current regimen. Continue current regimen. Continue to monitor. Call with any concerns. Refills given. Checking labs today.       Relevant Medications   atorvastatin (LIPITOR) 20 MG tablet   ezetimibe (ZETIA) 10 MG tablet   lisinopril (PRINIVIL,ZESTRIL) 30 MG tablet   Other Relevant Orders   CBC with Differential/Platelet   Comprehensive metabolic panel   Microalbumin, Urine Waived   UA/M w/rflx Culture, Routine     Respiratory   COPD (chronic obstructive pulmonary disease) (HCC)    Lungs wheezy today. Start albuterol. Continue symbicort. Call if not getting better or worse.       Relevant Medications   albuterol (PROVENTIL HFA;VENTOLIN HFA) 108 (90 Base) MCG/ACT inhaler   albuterol (PROVENTIL) (2.5 MG/3ML) 0.083% nebulizer solution   budesonide-formoterol (SYMBICORT) 160-4.5 MCG/ACT inhaler   cetirizine (ZYRTEC) 10 MG tablet   fluticasone (FLONASE) 50 MCG/ACT nasal spray   Other Relevant Orders   CBC with Differential/Platelet   Comprehensive metabolic panel   UA/M w/rflx Culture, Routine     Digestive   GERD (gastroesophageal reflux disease)    Under good control on current regimen. Continue current regimen. Continue to monitor. Call with any concerns. Refills given. Checking labs today.       Relevant Medications   pantoprazole (PROTONIX) 40 MG tablet   Other Relevant Orders   CBC with Differential/Platelet   Comprehensive metabolic panel   UA/M w/rflx Culture, Routine     Endocrine   Diet-controlled diabetes mellitus (HCC)    Under good control on current regime with A1c of 6.0. Continue current regimen. Continue to monitor. Call with any concerns. Refills given. Checking labs today.      Relevant Medications   atorvastatin (LIPITOR) 20 MG tablet   lisinopril (PRINIVIL,ZESTRIL) 30 MG tablet   Other Relevant Orders   CBC with Differential/Platelet   Bayer DCA Hb A1c Waived   Comprehensive metabolic panel   Microalbumin, Urine Waived   UA/M w/rflx Culture, Routine   Thyroid nodule    Rechecking levels today. Await results. Due for follow up US in August 2020.      Relevant Orders   CBC with Differential/Platelet   Comprehensive metabolic panel   TSH   UA/M w/rflx Culture, Routine     Other   Hyperlipidemia    Under good control on current regimen. Continue current regimen. Continue to monitor. Call with any concerns. Refills given. Checking labs today.      Relevant Medications   atorvastatin (LIPITOR) 20 MG tablet   ezetimibe (ZETIA) 10 MG tablet   lisinopril (PRINIVIL,ZESTRIL) 30 MG tablet   Other Relevant Orders   CBC with Differential/Platelet   Comprehensive metabolic panel   Lipid Panel Piccolo, Waived   UA/M w/rflx Culture, Routine    Other Visit Diagnoses    Routine general medical examination at a health care facility    -  Primary   Vaccines up to date. Screening labs checked today. Pap and colonoscopy up to date. Mammogram ordered. Continue diet and exercise. Call with any concerns.    Screening for HIV  without presence of risk factors       Labs drawn today, await results.    Relevant Orders   HIV Antibody (routine testing w rflx)   CBC with Differential/Platelet   Comprehensive metabolic panel   UA/M w/rflx Culture, Routine   Sebaceous  cyst       Hot compresses. Call with any concerns or if not getting better.        Follow up plan: Return in about 6 months (around 11/21/2018) for Follow up.   LABORATORY TESTING:  - Pap smear: up to date  IMMUNIZATIONS:   - Tdap: Tetanus vaccination status reviewed: last tetanus booster within 10 years. - Influenza: Up to date - Pneumovax: Up to date  SCREENING: -Mammogram: Order in- encouraged to go   - Colonoscopy: Up to date   PATIENT COUNSELING:   Advised to take 1 mg of folate supplement per day if capable of pregnancy.   Sexuality: Discussed sexually transmitted diseases, partner selection, use of condoms, avoidance of unintended pregnancy  and contraceptive alternatives.   Advised to avoid cigarette smoking.  I discussed with the patient that most people either abstain from alcohol or drink within safe limits (<=14/week and <=4 drinks/occasion for males, <=7/weeks and <= 3 drinks/occasion for females) and that the risk for alcohol disorders and other health effects rises proportionally with the number of drinks per week and how often a drinker exceeds daily limits.  Discussed cessation/primary prevention of drug use and availability of treatment for abuse.   Diet: Encouraged to adjust caloric intake to maintain  or achieve ideal body weight, to reduce intake of dietary saturated fat and total fat, to limit sodium intake by avoiding high sodium foods and not adding table salt, and to maintain adequate dietary potassium and calcium preferably from fresh fruits, vegetables, and low-fat dairy products.    stressed the importance of regular exercise  Injury prevention: Discussed safety belts, safety helmets, smoke detector, smoking near bedding or upholstery.   Dental health: Discussed importance of regular tooth brushing, flossing, and dental visits.    NEXT PREVENTATIVE PHYSICAL DUE IN 1 YEAR. Return in about 6 months (around 11/21/2018) for Follow  up.

## 2018-05-23 NOTE — Assessment & Plan Note (Signed)
Rechecking levels today. Await results. Due for follow up US in August 2020.

## 2018-05-23 NOTE — Patient Instructions (Addendum)
[facility]  Address: [address] [place], [place], [place] [address]  Phone: [phone]   Health Maintenance for Postmenopausal Women Menopause is a normal process in which your reproductive ability comes to an end. This process happens gradually over a span of months to years, usually between the ages of 68 and 80. Menopause is complete when you have missed 12 consecutive menstrual periods. It is important to talk with your health care provider about some of the most common conditions that affect postmenopausal women, such as heart disease, cancer, and bone loss (osteoporosis). Adopting a healthy lifestyle and getting preventive care can help to promote your health and wellness. Those actions can also lower your chances of developing some of these common conditions. What should I know about menopause? During menopause, you may experience a number of symptoms, such as:  Moderate-to-severe hot flashes.  Night sweats.  Decrease in sex drive.  Mood swings.  Headaches.  Tiredness.  Irritability.  Memory problems.  Insomnia.  Choosing to treat or not to treat menopausal changes is an individual decision that you make with your health care provider. What should I know about hormone replacement therapy and supplements? Hormone therapy products are effective for treating symptoms that are associated with menopause, such as hot flashes and night sweats. Hormone replacement carries certain risks, especially as you become older. If you are thinking about using estrogen or estrogen with progestin treatments, discuss the benefits and risks with your health care provider. What should I know about heart disease and stroke? Heart disease, heart attack, and stroke become more likely as you age. This may be due, in part, to the hormonal changes that your body experiences during menopause. These can affect how your body processes dietary fats, triglycerides, and  cholesterol. Heart attack and stroke are both medical emergencies. There are many things that you can do to help prevent heart disease and stroke:  Have your blood pressure checked at least every 1-2 years. High blood pressure causes heart disease and increases the risk of stroke.  If you are 20-77 years old, ask your health care provider if you should take aspirin to prevent a heart attack or a stroke.  Do not use any tobacco products, including cigarettes, chewing tobacco, or electronic cigarettes. If you need help quitting, ask your health care provider.  It is important to eat a healthy diet and maintain a healthy weight. ? Be sure to include plenty of vegetables, fruits, low-fat dairy products, and lean protein. ? Avoid eating foods that are high in solid fats, added sugars, or salt (sodium).  Get regular exercise. This is one of the most important things that you can do for your health. ? Try to exercise for at least 150 minutes each week. The type of exercise that you do should increase your heart rate and make you sweat. This is known as moderate-intensity exercise. ? Try to do strengthening exercises at least twice each week. Do these in addition to the moderate-intensity exercise.  Know your numbers.Ask your health care provider to check your cholesterol and your blood glucose. Continue to have your blood tested as directed by your health care provider.  What should I know about cancer screening? There are several types of cancer. Take the following steps to reduce your risk and to catch any cancer development as early as possible. Breast Cancer  Practice breast self-awareness. ? This means understanding how your breasts normally appear and feel. ? It also means  doing regular breast self-exams. Let your health care provider know about any changes, no matter how small.  If you are 41 or older, have a clinician do a breast exam (clinical breast exam or CBE) every year. Depending  on your age, family history, and medical history, it may be recommended that you also have a yearly breast X-ray (mammogram).  If you have a family history of breast cancer, talk with your health care provider about genetic screening.  If you are at high risk for breast cancer, talk with your health care provider about having an MRI and a mammogram every year.  Breast cancer (BRCA) gene test is recommended for women who have family members with BRCA-related cancers. Results of the assessment will determine the need for genetic counseling and BRCA1 and for BRCA2 testing. BRCA-related cancers include these types: ? Breast. This occurs in males or females. ? Ovarian. ? Tubal. This may also be called fallopian tube cancer. ? Cancer of the abdominal or pelvic lining (peritoneal cancer). ? Prostate. ? Pancreatic.  Cervical, Uterine, and Ovarian Cancer Your health care provider may recommend that you be screened regularly for cancer of the pelvic organs. These include your ovaries, uterus, and vagina. This screening involves a pelvic exam, which includes checking for microscopic changes to the surface of your cervix (Pap test).  For women ages 21-65, health care providers may recommend a pelvic exam and a Pap test every three years. For women ages 81-65, they may recommend the Pap test and pelvic exam, combined with testing for human papilloma virus (HPV), every five years. Some types of HPV increase your risk of cervical cancer. Testing for HPV may also be done on women of any age who have unclear Pap test results.  Other health care providers may not recommend any screening for nonpregnant women who are considered low risk for pelvic cancer and have no symptoms. Ask your health care provider if a screening pelvic exam is right for you.  If you have had past treatment for cervical cancer or a condition that could lead to cancer, you need Pap tests and screening for cancer for at least 20 years after  your treatment. If Pap tests have been discontinued for you, your risk factors (such as having a new sexual partner) need to be reassessed to determine if you should start having screenings again. Some women have medical problems that increase the chance of getting cervical cancer. In these cases, your health care provider may recommend that you have screening and Pap tests more often.  If you have a family history of uterine cancer or ovarian cancer, talk with your health care provider about genetic screening.  If you have vaginal bleeding after reaching menopause, tell your health care provider.  There are currently no reliable tests available to screen for ovarian cancer.  Lung Cancer Lung cancer screening is recommended for adults 82-21 years old who are at high risk for lung cancer because of a history of smoking. A yearly low-dose CT scan of the lungs is recommended if you:  Currently smoke.  Have a history of at least 30 pack-years of smoking and you currently smoke or have quit within the past 15 years. A pack-year is smoking an average of one pack of cigarettes per day for one year.  Yearly screening should:  Continue until it has been 15 years since you quit.  Stop if you develop a health problem that would prevent you from having lung cancer treatment.  Colorectal  Cancer  This type of cancer can be detected and can often be prevented.  Routine colorectal cancer screening usually begins at age 61 and continues through age 22.  If you have risk factors for colon cancer, your health care provider may recommend that you be screened at an earlier age.  If you have a family history of colorectal cancer, talk with your health care provider about genetic screening.  Your health care provider may also recommend using home test kits to check for hidden blood in your stool.  A small camera at the end of a tube can be used to examine your colon directly (sigmoidoscopy or colonoscopy).  This is done to check for the earliest forms of colorectal cancer.  Direct examination of the colon should be repeated every 5-10 years until age 50. However, if early forms of precancerous polyps or small growths are found or if you have a family history or genetic risk for colorectal cancer, you may need to be screened more often.  Skin Cancer  Check your skin from head to toe regularly.  Monitor any moles. Be sure to tell your health care provider: ? About any new moles or changes in moles, especially if there is a change in a mole's shape or color. ? If you have a mole that is larger than the size of a pencil eraser.  If any of your family members has a history of skin cancer, especially at a young age, talk with your health care provider about genetic screening.  Always use sunscreen. Apply sunscreen liberally and repeatedly throughout the day.  Whenever you are outside, protect yourself by wearing long sleeves, pants, a wide-brimmed hat, and sunglasses.  What should I know about osteoporosis? Osteoporosis is a condition in which bone destruction happens more quickly than new bone creation. After menopause, you may be at an increased risk for osteoporosis. To help prevent osteoporosis or the bone fractures that can happen because of osteoporosis, the following is recommended:  If you are 58-61 years old, get at least 1,000 mg of calcium and at least 600 mg of vitamin D per day.  If you are older than age 65 but younger than age 38, get at least 1,200 mg of calcium and at least 600 mg of vitamin D per day.  If you are older than age 78, get at least 1,200 mg of calcium and at least 800 mg of vitamin D per day.  Smoking and excessive alcohol intake increase the risk of osteoporosis. Eat foods that are rich in calcium and vitamin D, and do weight-bearing exercises several times each week as directed by your health care provider. What should I know about how menopause affects my mental  health? Depression may occur at any age, but it is more common as you become older. Common symptoms of depression include:  Low or sad mood.  Changes in sleep patterns.  Changes in appetite or eating patterns.  Feeling an overall lack of motivation or enjoyment of activities that you previously enjoyed.  Frequent crying spells.  Talk with your health care provider if you think that you are experiencing depression. What should I know about immunizations? It is important that you get and maintain your immunizations. These include:  Tetanus, diphtheria, and pertussis (Tdap) booster vaccine.  Influenza every year before the flu season begins.  Pneumonia vaccine.  Shingles vaccine.  Your health care provider may also recommend other immunizations. This information is not intended to replace advice given to  you by your health care provider. Make sure you discuss any questions you have with your health care provider. Document Released: 08/03/2005 Document Revised: 12/30/2015 Document Reviewed: 03/15/2015 Elsevier Interactive Patient Education  2018 [employer].

## 2018-05-24 LAB — COMPREHENSIVE METABOLIC PANEL
ALK PHOS: 141 IU/L — AB (ref 39–117)
ALT: 36 IU/L — AB (ref 0–32)
AST: 29 IU/L (ref 0–40)
Albumin/Globulin Ratio: 1.6 (ref 1.2–2.2)
Albumin: 4.3 g/dL (ref 3.5–5.5)
BUN/Creatinine Ratio: 12 (ref 9–23)
BUN: 8 mg/dL (ref 6–24)
Bilirubin Total: 0.6 mg/dL (ref 0.0–1.2)
CALCIUM: 9.2 mg/dL (ref 8.7–10.2)
CO2: 21 mmol/L (ref 20–29)
CREATININE: 0.66 mg/dL (ref 0.57–1.00)
Chloride: 103 mmol/L (ref 96–106)
GFR calc Af Amer: 113 mL/min/{1.73_m2} (ref >59)
GFR, EST NON AFRICAN AMERICAN: 98 mL/min/{1.73_m2} (ref >59)
GLOBULIN, TOTAL: 2.7 g/dL (ref 1.5–4.5)
GLUCOSE: 91 mg/dL (ref 65–99)
Potassium: 3.9 mmol/L (ref 3.5–5.2)
SODIUM: 139 mmol/L (ref 134–144)
Total Protein: 7 g/dL (ref 6.0–8.5)

## 2018-05-24 LAB — CBC WITH DIFFERENTIAL/PLATELET
BASOS ABS: 0.1 10*3/uL (ref 0.0–0.2)
Basos: 1 %
EOS (ABSOLUTE): 0.3 10*3/uL (ref 0.0–0.4)
EOS: 3 %
HEMATOCRIT: 46.5 % (ref 34.0–46.6)
Hemoglobin: 15.6 g/dL (ref 11.1–15.9)
Immature Grans (Abs): 0 10*3/uL (ref 0.0–0.1)
Immature Granulocytes: 0 %
Lymphocytes Absolute: 3 10*3/uL (ref 0.7–3.1)
Lymphs: 30 %
MCH: 29.8 pg (ref 26.6–33.0)
MCHC: 33.5 g/dL (ref 31.5–35.7)
MCV: 89 fL (ref 79–97)
Monocytes Absolute: 0.7 10*3/uL (ref 0.1–0.9)
Monocytes: 7 %
NEUTROS PCT: 59 %
Neutrophils Absolute: 5.8 10*3/uL (ref 1.4–7.0)
PLATELETS: 264 10*3/uL (ref 150–450)
RBC: 5.23 x10E6/uL (ref 3.77–5.28)
RDW: 12.3 % (ref 12.3–15.4)
WBC: 9.9 10*3/uL (ref 3.4–10.8)

## 2018-05-24 LAB — HIV ANTIBODY (ROUTINE TESTING W REFLEX): HIV Screen 4th Generation wRfx: NONREACTIVE

## 2018-05-24 LAB — TSH: TSH: 1.17 u[IU]/mL (ref 0.450–4.500)

## 2018-07-21 ENCOUNTER — Ambulatory Visit (INDEPENDENT_AMBULATORY_CARE_PROVIDER_SITE_OTHER): Payer: BLUE CROSS/BLUE SHIELD | Admitting: Family Medicine

## 2018-07-21 ENCOUNTER — Ambulatory Visit: Payer: Self-pay

## 2018-07-21 ENCOUNTER — Encounter: Payer: Self-pay | Admitting: Family Medicine

## 2018-07-21 VITALS — BP 145/90 | HR 81 | Temp 97.9°F | Ht 62.7 in | Wt 188.2 lb

## 2018-07-21 DIAGNOSIS — J441 Chronic obstructive pulmonary disease with (acute) exacerbation: Secondary | ICD-10-CM | POA: Diagnosis not present

## 2018-07-21 MED ORDER — DOXYCYCLINE HYCLATE 100 MG PO TABS: 100.0000 mg | ORAL_TABLET | Freq: Two times a day (BID) | ORAL | 0 refills | Status: DC

## 2018-07-21 MED ORDER — PREDNISONE 10 MG PO TABS: ORAL_TABLET | ORAL | 0 refills | Status: DC

## 2018-07-21 MED ORDER — HYDROCOD POLST-CPM POLST ER 10-8 MG/5ML PO SUER: 5.0000 mL | Freq: Every evening | ORAL | 0 refills | Status: DC | PRN

## 2018-07-21 NOTE — Progress Notes (Signed)
BP (!) 145/90 (BP Location: Right Arm, Patient Position: Sitting, Cuff Size: Normal)   Pulse 81   Temp 97.9 F (36.6 C)   Ht 5' 2.7" (1.593 m)   Wt 188 lb 3 oz (85.4 kg)   SpO2 99%   BMI 33.66 kg/m    Subjective:    Patient ID: Monica Hull, female    DOB: [birth year], 58 y.o.   MRN: [id number]  HPI: Monica Hull is a 58 y.o. female  Chief Complaint  Patient presents with  . COPD    cough, sob and chest tightness   Here today for several weeks of hacking cough that has progressively worsened. Now having SOB, wheezing, fatigue as well. No fevers, chills, CP, sore throat, significant congestion. Hx of COPD, current smoker. Consistent with home inhaler regimen. Several sick contacts recently.   Relevant past medical, surgical, family and social history reviewed and updated as indicated. Interim medical history since our last visit reviewed. Allergies and medications reviewed and updated.  Review of Systems  Per HPI unless specifically indicated above     Objective:    BP (!) 145/90 (BP Location: Right Arm, Patient Position: Sitting, Cuff Size: Normal)   Pulse 81   Temp 97.9 F (36.6 C)   Ht 5' 2.7" (1.593 m)   Wt 188 lb 3 oz (85.4 kg)   SpO2 99%   BMI 33.66 kg/m   Wt Readings from Last 3 Encounters:  07/21/18 188 lb 3 oz (85.4 kg)  05/23/18 189 lb (85.7 kg)  03/24/18 186 lb 4 oz (84.5 kg)    Physical Exam Vitals signs and nursing note reviewed.  Constitutional:      Appearance: Normal appearance.  HENT:     Head: Atraumatic.     Right Ear: Tympanic membrane and external ear normal.     Left Ear: Tympanic membrane and external ear normal.     Nose: Congestion present.     Mouth/Throat:     Mouth: Mucous membranes are moist.     Pharynx: Posterior oropharyngeal erythema present.  Eyes:     Extraocular Movements: Extraocular movements intact.     Conjunctiva/sclera: Conjunctivae normal.  Neck:     Musculoskeletal: Normal range of motion  and neck supple.  Cardiovascular:     Rate and Rhythm: Normal rate and regular rhythm.     Heart sounds: Normal heart sounds.  Pulmonary:     Effort: Pulmonary effort is normal.     Breath sounds: Wheezing present.  Musculoskeletal: Normal range of motion.  Skin:    General: Skin is warm and dry.  Neurological:     Mental Status: She is alert and oriented to person, place, and time.  Psychiatric:        Mood and Affect: Mood normal.        Thought Content: Thought content normal.     Results for orders placed or performed in visit on 05/23/18  HIV Antibody (routine testing w rflx)  Result Value Ref Range   HIV Screen 4th Generation wRfx Non Reactive Non Reactive  CBC with Differential/Platelet  Result Value Ref Range   WBC 9.9 3.4 - 10.8 x10E3/uL   RBC 5.23 3.77 - 5.28 x10E6/uL   Hemoglobin 15.6 11.1 - 15.9 g/dL   Hematocrit 01.4 10.3 - 46.6 %   MCV 89 79 - 97 fL   MCH 29.8 26.6 - 33.0 pg   MCHC 33.5 31.5 - 35.7 g/dL   RDW 01.3 14.3 - 88.8 %  Platelets 264 150 - 450 x10E3/uL   Neutrophils 59 Not Estab. %   Lymphs 30 Not Estab. %   Monocytes 7 Not Estab. %   Eos 3 Not Estab. %   Basos 1 Not Estab. %   Neutrophils Absolute 5.8 1.4 - 7.0 x10E3/uL   Lymphocytes Absolute 3.0 0.7 - 3.1 x10E3/uL   Monocytes Absolute 0.7 0.1 - 0.9 x10E3/uL   EOS (ABSOLUTE) 0.3 0.0 - 0.4 x10E3/uL   Basophils Absolute 0.1 0.0 - 0.2 x10E3/uL   Immature Granulocytes 0 Not Estab. %   Immature Grans (Abs) 0.0 0.0 - 0.1 x10E3/uL  Bayer DCA Hb A1c Waived  Result Value Ref Range   HB A1C (BAYER DCA - WAIVED) 6.0 <7.0 %  Comprehensive metabolic panel  Result Value Ref Range   Glucose 91 65 - 99 mg/dL   BUN 8 6 - 24 mg/dL   Creatinine, Ser 7.01 0.57 - 1.00 mg/dL   GFR calc non Af Amer 98 >59 mL/min/1.73   GFR calc Af Amer 113 >59 mL/min/1.73   BUN/Creatinine Ratio 12 9 - 23   Sodium 139 134 - 144 mmol/L   Potassium 3.9 3.5 - 5.2 mmol/L   Chloride 103 96 - 106 mmol/L   CO2 21 20 - 29 mmol/L    Calcium 9.2 8.7 - 10.2 mg/dL   Total Protein 7.0 6.0 - 8.5 g/dL   Albumin 4.3 3.5 - 5.5 g/dL   Globulin, Total 2.7 1.5 - 4.5 g/dL   Albumin/Globulin Ratio 1.6 1.2 - 2.2   Bilirubin Total 0.6 0.0 - 1.2 mg/dL   Alkaline Phosphatase 141 (H) 39 - 117 IU/L   AST 29 0 - 40 IU/L   ALT 36 (H) 0 - 32 IU/L  Lipid Panel Piccolo, Waived  Result Value Ref Range   Cholesterol Piccolo, Waived 195 <200 mg/dL   HDL Chol Piccolo, Waived 48 (L) >59 mg/dL   Triglycerides Piccolo,Waived 183 (H) <150 mg/dL   Chol/HDL Ratio Piccolo,Waive 4.0 mg/dL   LDL Chol Calc Piccolo Waived 110 (H) <100 mg/dL   VLDL Chol Calc Piccolo,Waive 37 (H) <30 mg/dL  Microalbumin, Urine Waived  Result Value Ref Range   Microalb, Ur Waived 10 0 - 19 mg/L   Creatinine, Urine Waived 50 10 - 300 mg/dL   Microalb/Creat Ratio 30-300 (H) <30 mg/g  TSH  Result Value Ref Range   TSH 1.170 0.450 - 4.500 uIU/mL  UA/M w/rflx Culture, Routine  Result Value Ref Range   Specific Gravity, UA 1.010 1.005 - 1.030   pH, UA 7.0 5.0 - 7.5   Color, UA Yellow Yellow   Appearance Ur Clear Clear   Leukocytes, UA Negative Negative   Protein, UA Negative Negative/Trace   Glucose, UA Negative Negative   Ketones, UA Negative Negative   RBC, UA Negative Negative   Bilirubin, UA Negative Negative   Urobilinogen, Ur 2.0 (H) 0.2 - 1.0 mg/dL   Nitrite, UA Negative Negative      Assessment & Plan:   Problem List Items Addressed This Visit      Respiratory   COPD (chronic obstructive pulmonary disease) (HCC) - Primary    Tx with doxycycline, prednisone taper, tussionex at bedtime prn. Supportive care reviewed, smoking cessation encouraged. F/u if worsening or not improving. Continue home inhaler and nebulizer regimen      Relevant Medications   predniSONE (DELTASONE) 10 MG tablet   chlorpheniramine-HYDROcodone (TUSSIONEX PENNKINETIC ER) 10-8 MG/5ML SUER       Follow up plan: Return  for as scheduled.

## 2018-07-21 NOTE — Telephone Encounter (Signed)
Pt. Started coughing 2 days ago. Non-productive. Chest feels tight. Shortness of breath with exertion. No fever. No availability with Dr. Laural Benes. Appointment made.  Reason for Disposition . [1] Continuous (nonstop) coughing interferes with work or school AND [2] no improvement using cough treatment per protocol  Answer Assessment - Initial Assessment Questions 1. ONSET: "When did the cough begin?"      2 days ago 2. SEVERITY: "How bad is the cough today?"      Severe 3. RESPIRATORY DISTRESS: "Describe your breathing."      Shortness of breath  4. FEVER: "Do you have a fever?" If so, ask: "What is your temperature, how was it measured, and when did it start?"     No 5. HEMOPTYSIS: "Are you coughing up any blood?" If so ask: "How much?" (flecks, streaks, tablespoons, etc.)     No 6. TREATMENT: "What have you done so far to treat the cough?" (e.g., meds, fluids, humidifier)     No 7. CARDIAC HISTORY: "Do you have any history of heart disease?" (e.g., heart attack, congestive heart failure)      HTN 8. LUNG HISTORY: "Do you have any history of lung disease?"  (e.g., pulmonary embolus, asthma, emphysema)     COPD 9. PE RISK FACTORS: "Do you have a history of blood clots?" (or: recent major surgery, recent prolonged travel, bedridden)     No 10. OTHER SYMPTOMS: "Do you have any other symptoms? (e.g., runny nose, wheezing, chest pain)       Tightness in chest 11. PREGNANCY: "Is there any chance you are pregnant?" "When was your last menstrual period?"       n/a 12. TRAVEL: "Have you traveled out of the country in the last month?" (e.g., travel history, exposures)       No  Protocols used: COUGH - ACUTE NON-PRODUCTIVE-A-AH

## 2018-07-25 ENCOUNTER — Other Ambulatory Visit: Payer: Self-pay | Admitting: Family Medicine

## 2018-07-25 NOTE — Assessment & Plan Note (Addendum)
Tx with doxycycline, prednisone taper, tussionex at bedtime prn. Supportive care reviewed, smoking cessation encouraged. F/u if worsening or not improving. Continue home inhaler and nebulizer regimen

## 2018-08-26 ENCOUNTER — Ambulatory Visit (INDEPENDENT_AMBULATORY_CARE_PROVIDER_SITE_OTHER): Payer: BLUE CROSS/BLUE SHIELD | Admitting: Family Medicine

## 2018-08-26 ENCOUNTER — Encounter: Payer: Self-pay | Admitting: Family Medicine

## 2018-08-26 VITALS — BP 110/74 | HR 74 | Temp 97.6°F | Ht 62.0 in | Wt 187.2 lb

## 2018-08-26 DIAGNOSIS — J111 Influenza due to unidentified influenza virus with other respiratory manifestations: Secondary | ICD-10-CM | POA: Diagnosis not present

## 2018-08-26 DIAGNOSIS — R52 Pain, unspecified: Secondary | ICD-10-CM | POA: Diagnosis not present

## 2018-08-26 LAB — VERITOR FLU A/B WAIVED
Influenza A: NEGATIVE
Influenza B: NEGATIVE

## 2018-08-26 MED ORDER — BENZONATATE 200 MG PO CAPS: 200.0000 mg | ORAL_CAPSULE | Freq: Three times a day (TID) | ORAL | 0 refills | Status: DC | PRN

## 2018-08-26 NOTE — Progress Notes (Signed)
BP 110/74 (BP Location: Left Arm, Patient Position: Sitting, Cuff Size: Normal)   Pulse 74   Temp 97.6 F (36.4 C) (Oral)   Ht 5\' 2"  (1.575 m)   Wt 187 lb 3.2 oz (84.9 kg)   SpO2 96%   BMI 34.24 kg/m    Subjective:    Patient ID: Monica Hull, female    DOB: Mar 25, 1961, 58 y.o.   MRN: [id number]  HPI: Ayjah Roznowski is a 58 y.o. female  Chief Complaint  Patient presents with  . Nausea    Ongoing since yesterday morning. Onset symptoms.   . Chills  . Fever  . Generalized Body Aches   Here today with sudden onset fever, chills, generalized body aches, sweats, nausea, congestion, cough x 1 day. Denies CP, SOB, ear pain, vomiting, diarrhea. Taking cold and flu medications with mild temporary relief. Several sick contacts recently. Hx of COPD and asthma and allergies consistent with home inhaler and antihistamine regimen.   Relevant past medical, surgical, family and social history reviewed and updated as indicated. Interim medical history since our last visit reviewed. Allergies and medications reviewed and updated.  Review of Systems  Per HPI unless specifically indicated above     Objective:    BP 110/74 (BP Location: Left Arm, Patient Position: Sitting, Cuff Size: Normal)   Pulse 74   Temp 97.6 F (36.4 C) (Oral)   Ht 5\' 2"  (1.575 m)   Wt 187 lb 3.2 oz (84.9 kg)   SpO2 96%   BMI 34.24 kg/m   Wt Readings from Last 3 Encounters:  08/26/18 187 lb 3.2 oz (84.9 kg)  07/21/18 188 lb 3 oz (85.4 kg)  05/23/18 189 lb (85.7 kg)    Physical Exam Vitals signs and nursing note reviewed.  Constitutional:      Appearance: Normal appearance.  HENT:     Head: Atraumatic.     Right Ear: Tympanic membrane and external ear normal.     Left Ear: Tympanic membrane and external ear normal.     Nose: Rhinorrhea present.     Mouth/Throat:     Mouth: Mucous membranes are moist.     Pharynx: Posterior oropharyngeal erythema present.  Eyes:     Extraocular  Movements: Extraocular movements intact.     Conjunctiva/sclera: Conjunctivae normal.  Neck:     Musculoskeletal: Normal range of motion and neck supple.  Cardiovascular:     Rate and Rhythm: Normal rate and regular rhythm.     Heart sounds: Normal heart sounds.  Pulmonary:     Effort: Pulmonary effort is normal.     Breath sounds: Normal breath sounds. No wheezing or rales.  Musculoskeletal: Normal range of motion.  Skin:    General: Skin is warm and dry.  Neurological:     Mental Status: She is alert and oriented to person, place, and time.  Psychiatric:        Mood and Affect: Mood normal.        Thought Content: Thought content normal.     Results for orders placed or performed in visit on 08/26/18  Veritor Flu A/B Waived  Result Value Ref Range   Influenza A Negative Negative   Influenza B Negative Negative      Assessment & Plan:   Problem List Items Addressed This Visit    None    Visit Diagnoses    Influenza    -  Primary   Rapid flu neg, but sxs consistent. Tx with  xofluza (sample given), tessalon perles, OTC remedies and supportive care. Return precautions given   Relevant Orders   Veritor Flu A/B Waived (Completed)       Follow up plan: Return for as scheduled.

## 2018-09-09 ENCOUNTER — Ambulatory Visit: Payer: Self-pay

## 2018-09-09 NOTE — Telephone Encounter (Signed)
Patient called in with c/o "wheezing." She says "I was seen in the office by Fleet Contras for the flu 2 weeks ago and she told me if I needed the Prednisone to call and I usually get it to open up my lungs. I have wheezing, shortness of breath and a cough. I've been using my inhalers because I have COPD. It may be my COPD or my allergies acting up, I don't know." I asked about the severity of SOB, she says "mild to moderate." I asked about other symptoms, she says "my nose was runny last night, but not today. No other symptoms, no fever, no chest pain." I asked about recent travel or exposure to anyone sick, she denies. According to protocol, see PCP within 3 days, no availability with PCP, appointment scheduled for tomorrow, 09/10/18 at 0945 with Roosvelt Maser, PA, care advice given, patient verbalized understanding.  Reason for Disposition . [1] MODERATE longstanding difficulty breathing (e.g., speaks in phrases, SOB even at rest, pulse 100-120) AND [2] SAME as normal  Answer Assessment - Initial Assessment Questions 1. RESPIRATORY STATUS: "Describe your breathing?" (e.g., wheezing, shortness of breath, unable to speak, severe coughing)      Wheezing, shortness of breath, cough 2. ONSET: "When did this breathing problem begin?"      A couple of days ago 3. PATTERN "Does the difficult breathing come and go, or has it been constant since it started?"      Comes and goes 4. SEVERITY: "How bad is your breathing?" (e.g., mild, moderate, severe)    - MILD: No SOB at rest, mild SOB with walking, speaks normally in sentences, can lay down, no retractions, pulse < 100.    - MODERATE: SOB at rest, SOB with minimal exertion and prefers to sit, cannot lie down flat, speaks in phrases, mild retractions, audible wheezing, pulse 100-120.    - SEVERE: Very SOB at rest, speaks in single words, struggling to breathe, sitting hunched forward, retractions, pulse > 120      Mild to moderate 5. RECURRENT SYMPTOM: "Have you had  difficulty breathing before?" If so, ask: "When was the last time?" and "What happened that time?"      Yes, off and on chronically 6. CARDIAC HISTORY: "Do you have any history of heart disease?" (e.g., heart attack, angina, bypass surgery, angioplasty)      No 7. LUNG HISTORY: "Do you have any history of lung disease?"  (e.g., pulmonary embolus, asthma, emphysema)     COPD, asthma, emphysema 8. CAUSE: "What do you think is causing the breathing problem?"      I think COPD 9. OTHER SYMPTOMS: "Do you have any other symptoms? (e.g., dizziness, runny nose, cough, chest pain, fever)     Cough, runny nose last night only 10. PREGNANCY: "Is there any chance you are pregnant?" "When was your last menstrual period?"       No 11. TRAVEL: "Have you traveled out of the country in the last month?" (e.g., travel history, exposures)       No  Protocols used: BREATHING DIFFICULTY-A-AH

## 2018-09-10 ENCOUNTER — Ambulatory Visit (INDEPENDENT_AMBULATORY_CARE_PROVIDER_SITE_OTHER): Payer: BLUE CROSS/BLUE SHIELD | Admitting: Family Medicine

## 2018-09-10 ENCOUNTER — Encounter: Payer: Self-pay | Admitting: Family Medicine

## 2018-09-10 ENCOUNTER — Other Ambulatory Visit: Payer: Self-pay

## 2018-09-10 VITALS — BP 156/83 | HR 67 | Temp 97.8°F | Ht 62.5 in | Wt 187.0 lb

## 2018-09-10 DIAGNOSIS — J441 Chronic obstructive pulmonary disease with (acute) exacerbation: Secondary | ICD-10-CM | POA: Diagnosis not present

## 2018-09-10 MED ORDER — PREDNISONE 10 MG PO TABS: 10.0000 mg | ORAL_TABLET | Freq: Every day | ORAL | 0 refills | Status: DC

## 2018-09-10 MED ORDER — ALBUTEROL SULFATE (2.5 MG/3ML) 0.083% IN NEBU: 2.5000 mg | INHALATION_SOLUTION | Freq: Four times a day (QID) | RESPIRATORY_TRACT | 1 refills | Status: DC | PRN

## 2018-09-10 MED ORDER — DOXYCYCLINE HYCLATE 100 MG PO TABS: 100.0000 mg | ORAL_TABLET | Freq: Two times a day (BID) | ORAL | 0 refills | Status: DC

## 2018-09-10 NOTE — Progress Notes (Signed)
BP (!) 156/83 (BP Location: Left Arm, Patient Position: Sitting, Cuff Size: Normal)   Pulse 67   Temp 97.8 F (36.6 C) (Oral)   Ht 5' 2.5" (1.588 m)   Wt 187 lb (84.8 kg)   SpO2 99%   BMI 33.66 kg/m    Subjective:    Patient ID: Monica Hull, female    DOB: [birth year], 59 y.o.   MRN: [id number]  HPI: Monica Hull is a 58 y.o. female  Chief Complaint  Patient presents with  . Cough    Non-productive cough. Patient thinks it may be due to allergies/COPD.  Marland Kitchen Shortness of Breath    Ongoing 2 days.  . Wheezing   Here today for continued cough, wheezing, SOB since flu like illness almost 3 weeks ago. Was treated for the flu with mild temporary relief, and congestion, sore throat, fevers, chills gone. Hx of COPD and allergic rhinitis, current every day smoker. Compliant with allergy and inhaler regimen.   Relevant past medical, surgical, family and social history reviewed and updated as indicated. Interim medical history since our last visit reviewed. Allergies and medications reviewed and updated.  Review of Systems  Per HPI unless specifically indicated above     Objective:    BP (!) 156/83 (BP Location: Left Arm, Patient Position: Sitting, Cuff Size: Normal)   Pulse 67   Temp 97.8 F (36.6 C) (Oral)   Ht 5' 2.5" (1.588 m)   Wt 187 lb (84.8 kg)   SpO2 99%   BMI 33.66 kg/m   Wt Readings from Last 3 Encounters:  09/10/18 187 lb (84.8 kg)  08/26/18 187 lb 3.2 oz (84.9 kg)  07/21/18 188 lb 3 oz (85.4 kg)    Physical Exam Vitals signs and nursing note reviewed.  Constitutional:      Appearance: Normal appearance.  HENT:     Head: Atraumatic.     Right Ear: Tympanic membrane and external ear normal.     Left Ear: Tympanic membrane and external ear normal.     Nose: Congestion present.     Mouth/Throat:     Mouth: Mucous membranes are moist.     Pharynx: Posterior oropharyngeal erythema present.  Eyes:     Extraocular Movements: Extraocular  movements intact.     Conjunctiva/sclera: Conjunctivae normal.  Neck:     Musculoskeletal: Normal range of motion and neck supple.  Cardiovascular:     Rate and Rhythm: Normal rate and regular rhythm.     Heart sounds: Normal heart sounds.  Pulmonary:     Effort: Pulmonary effort is normal.     Breath sounds: Wheezing present. No rales.  Musculoskeletal: Normal range of motion.  Skin:    General: Skin is warm and dry.  Neurological:     Mental Status: She is alert and oriented to person, place, and time.  Psychiatric:        Mood and Affect: Mood normal.        Thought Content: Thought content normal.     Results for orders placed or performed in visit on 08/26/18  Veritor Flu A/B Waived  Result Value Ref Range   Influenza A Negative Negative   Influenza B Negative Negative      Assessment & Plan:   Problem List Items Addressed This Visit      Respiratory   COPD (chronic obstructive pulmonary disease) (HCC) - Primary    Exacerbation likely residual from viral illness. Declines x-ray today. Tx with doxycycline, prednisone  taper, home inhaler and nebulizer regimen. Rx given for new mask and tubing for home neb machine. Strict return precautions given      Relevant Medications   albuterol (PROVENTIL) (2.5 MG/3ML) 0.083% nebulizer solution   predniSONE (DELTASONE) 10 MG tablet       Follow up plan: Return if symptoms worsen or fail to improve.

## 2018-09-11 NOTE — Assessment & Plan Note (Addendum)
Exacerbation likely residual from viral illness. Declines x-ray today. Tx with doxycycline, prednisone taper, home inhaler and nebulizer regimen. Rx given for new mask and tubing for home neb machine. Strict return precautions given

## 2018-09-12 ENCOUNTER — Encounter: Payer: Self-pay | Admitting: Family Medicine

## 2018-09-15 ENCOUNTER — Other Ambulatory Visit: Payer: Self-pay | Admitting: Family Medicine

## 2018-09-15 DIAGNOSIS — R0602 Shortness of breath: Secondary | ICD-10-CM

## 2018-11-18 ENCOUNTER — Telehealth: Payer: Self-pay | Admitting: Family Medicine

## 2018-11-18 ENCOUNTER — Other Ambulatory Visit: Payer: Self-pay

## 2018-11-18 ENCOUNTER — Encounter: Payer: Self-pay | Admitting: Family Medicine

## 2018-11-18 ENCOUNTER — Ambulatory Visit (INDEPENDENT_AMBULATORY_CARE_PROVIDER_SITE_OTHER): Payer: Self-pay | Admitting: Family Medicine

## 2018-11-18 DIAGNOSIS — J441 Chronic obstructive pulmonary disease with (acute) exacerbation: Secondary | ICD-10-CM

## 2018-11-18 DIAGNOSIS — N393 Stress incontinence (female) (male): Secondary | ICD-10-CM

## 2018-11-18 DIAGNOSIS — E119 Type 2 diabetes mellitus without complications: Secondary | ICD-10-CM

## 2018-11-18 DIAGNOSIS — E041 Nontoxic single thyroid nodule: Secondary | ICD-10-CM

## 2018-11-18 DIAGNOSIS — J4530 Mild persistent asthma, uncomplicated: Secondary | ICD-10-CM

## 2018-11-18 DIAGNOSIS — E669 Obesity, unspecified: Secondary | ICD-10-CM

## 2018-11-18 DIAGNOSIS — K219 Gastro-esophageal reflux disease without esophagitis: Secondary | ICD-10-CM

## 2018-11-18 DIAGNOSIS — E782 Mixed hyperlipidemia: Secondary | ICD-10-CM

## 2018-11-18 DIAGNOSIS — I10 Essential (primary) hypertension: Secondary | ICD-10-CM

## 2018-11-18 MED ORDER — ATORVASTATIN CALCIUM 20 MG PO TABS: 20.0000 mg | ORAL_TABLET | Freq: Every day | ORAL | 1 refills | Status: DC

## 2018-11-18 MED ORDER — EZETIMIBE 10 MG PO TABS: 10.0000 mg | ORAL_TABLET | Freq: Every day | ORAL | 1 refills | Status: DC

## 2018-11-18 MED ORDER — LISINOPRIL 30 MG PO TABS: 30.0000 mg | ORAL_TABLET | Freq: Every day | ORAL | 1 refills | Status: DC

## 2018-11-18 MED ORDER — PANTOPRAZOLE SODIUM 40 MG PO TBEC: 40.0000 mg | DELAYED_RELEASE_TABLET | ORAL | 1 refills | Status: DC

## 2018-11-18 MED ORDER — DICLOFENAC SODIUM 50 MG PO TBEC: 50.0000 mg | DELAYED_RELEASE_TABLET | Freq: Four times a day (QID) | ORAL | 6 refills | Status: DC

## 2018-11-18 MED ORDER — ALBUTEROL SULFATE (2.5 MG/3ML) 0.083% IN NEBU: 2.5000 mg | INHALATION_SOLUTION | Freq: Four times a day (QID) | RESPIRATORY_TRACT | 1 refills | Status: DC | PRN

## 2018-11-18 NOTE — Progress Notes (Signed)
There were no vitals taken for this visit.   Subjective:    Patient ID: Monica Hull Cutler, female    DOB: October 02, 1960, 58 y.o.   MRN: [id number]  HPI: Monica CoddingCynthia Hull Emmanuel is a 58 y.o. female  Chief Complaint  Patient presents with  . Follow-up  . Diabetes  . COPD  . Medication Management    Would like to increase medication Diclofenac  . Hypertension    Pt will call back with readings this afternoon  . Health Maintenance    Pt will schedule eye exam soon as she feels it is safe.    HYPERTENSION / HYPERLIPIDEMIA Satisfied with current treatment? yes Duration of hypertension: chronic BP monitoring frequency: not checking BP medication side effects: no Past BP meds: lisinopril Duration of hyperlipidemia: chronic Cholesterol medication side effects: no Cholesterol supplements: none Past cholesterol medications: atorvastatin, zetia Medication compliance: good compliance Aspirin: no Recent stressors: yes Recurrent headaches: no Visual changes: no Palpitations: no Dyspnea: yes Chest pain: no Lower extremity edema: no Dizzy/lightheaded: no  DIABETES Hypoglycemic episodes:no Polydipsia: No Polyuria: no Visual disturbance: no Chest pain: no Paresthesias: no Glucose Monitoring: no  Accucheck frequency: Not Checking Taking Insulin?: no Blood Pressure Monitoring: not checking Retinal Examination: Not up to Date Foot Exam: Up to Date Diabetic Education: Completed Pneumovax: Up to Date Influenza: Up to Date Aspirin: no  COPD COPD status: stable Satisfied with current treatment?: yes Oxygen use: no Dyspnea frequency: BID Cough frequency: all the time Rescue inhaler frequency:  Few times a day Limitation of activity: yes Productive cough: yes Pneumovax: Up to Date Influenza: Up to Date  Relevant past medical, surgical, family and social history reviewed and updated as indicated. Interim medical history since our last visit reviewed. Allergies and  medications reviewed and updated.  Review of Systems  Constitutional: Positive for fatigue. Negative for activity change, appetite change, chills, diaphoresis, fever and unexpected weight change.  HENT: Negative.   Respiratory: Positive for cough, shortness of breath and wheezing. Negative for apnea, choking, chest tightness and stridor.   Cardiovascular: Negative.   Gastrointestinal: Negative.   Musculoskeletal: Positive for arthralgias. Negative for back pain, gait problem, joint swelling, myalgias, neck pain and neck stiffness.  Skin: Negative.   Neurological: Negative.   Psychiatric/Behavioral: Negative.     Per HPI unless specifically indicated above     Objective:    There were no vitals taken for this visit.  Wt Readings from Last 3 Encounters:  09/10/18 187 lb (84.8 kg)  08/26/18 187 lb 3.2 oz (84.9 kg)  07/21/18 188 lb 3 oz (85.4 kg)    Physical Exam Vitals signs and nursing note reviewed.  Pulmonary:     Effort: Pulmonary effort is normal. No respiratory distress.     Comments: Speaking in full sentences Neurological:     Mental Status: She is alert.  Psychiatric:        Mood and Affect: Mood normal.        Behavior: Behavior normal.        Thought Content: Thought content normal.        Judgment: Judgment normal.     Results for orders placed or performed in visit on 08/26/18  Veritor Flu A/B Waived  Result Value Ref Range   Influenza A Negative Negative   Influenza B Negative Negative      Assessment & Plan:   Problem List Items Addressed This Visit      Cardiovascular and Mediastinum   Hypertension - Primary  Will get her in for BP check ASAP. Continue current regimen. Continue to monitor. Will get labs drawn. Call with any concerns.       Relevant Medications   lisinopril (ZESTRIL) 30 MG tablet   ezetimibe (ZETIA) 10 MG tablet   atorvastatin (LIPITOR) 20 MG tablet   Other Relevant Orders   CBC with Differential/Platelet   Comprehensive  metabolic panel   Microalbumin, Urine Waived     Respiratory   Asthma    Under good control on current regimen. Continue current regimen. Continue to monitor. Call with any concerns. Refills given. Will check labs ASAP.       Relevant Medications   albuterol (PROVENTIL) (2.5 MG/3ML) 0.083% nebulizer solution   Other Relevant Orders   CBC with Differential/Platelet   Comprehensive metabolic panel   COPD (chronic obstructive pulmonary disease) (HCC)    Under good control on current regimen. Continue current regimen. Continue to monitor. Call with any concerns. Refills given. Will check labs ASAP.      Relevant Medications   albuterol (PROVENTIL) (2.5 MG/3ML) 0.083% nebulizer solution   Other Relevant Orders   CBC with Differential/Platelet   Comprehensive metabolic panel     Digestive   GERD (gastroesophageal reflux disease)    Under good control on current regimen. Continue current regimen. Continue to monitor. Call with any concerns. Refills given. Will check labs ASAP.      Relevant Medications   pantoprazole (PROTONIX) 40 MG tablet     Endocrine   Diet-controlled diabetes mellitus (HCC)    Has been feeling well. Will get her in for labs ASAP and treat as needed. Call with any concerns.       Relevant Medications   lisinopril (ZESTRIL) 30 MG tablet   atorvastatin (LIPITOR) 20 MG tablet   Other Relevant Orders   Bayer DCA Hb A1c Waived   CBC with Differential/Platelet   Comprehensive metabolic panel   Thyroid nodule    Due for repeat US in September. Continue to monitor. Call with any concerns.. Will check labs ASAP.      Relevant Orders   CBC with Differential/Platelet   Comprehensive metabolic panel   TSH     Other   Hyperlipidemia    Under good control on current regimen. Continue current regimen. Continue to monitor. Call with any concerns. Refills given. Will check labs ASAP.      Relevant Medications   lisinopril (ZESTRIL) 30 MG tablet   ezetimibe  (ZETIA) 10 MG tablet   atorvastatin (LIPITOR) 20 MG tablet   Other Relevant Orders   CBC with Differential/Platelet   Comprehensive metabolic panel   Lipid Panel w/o Chol/HDL Ratio   Stress incontinence    Under good control on current regimen. Continue current regimen. Continue to monitor. Call with any concerns. Refills given. Will check labs ASAP.      Relevant Orders   CBC with Differential/Platelet   Comprehensive metabolic panel   Obesity (BMI 40.9-81.9)    Encouraged diet and exercise with goal of losing 1-2lbs per week. Call with any concerns.           Follow up plan: Return in about 6 months (around 05/21/2019), or ASAP labs and vitals, 6 months Physical.    . This visit was completed via telephone due to the restrictions of the COVID-19 pandemic. All issues as above were discussed and addressed but no physical exam was performed. If it was felt that the patient should be evaluated in the office, they were  directed there. The patient verbally consented to this visit. Patient was unable to complete an audio/visual visit due to Lack of equipment. Due to the catastrophic nature of the COVID-19 pandemic, this visit was done through audio contact only. . Location of the patient: home . Location of the provider: work . Those involved with this call:  . Provider: Olevia Perches, DO . CMA: Sheilah Mins, CMA . Front Desk/Registration: Adela Ports  . Time spent on call: 25 minutes on the phone discussing health concerns. 40 minutes total spent in review of patient's record and preparation of their chart.

## 2018-11-18 NOTE — Assessment & Plan Note (Signed)
Under good control on current regimen. Continue current regimen. Continue to monitor. Call with any concerns. Refills given. Will check labs ASAP.   

## 2018-11-18 NOTE — Telephone Encounter (Signed)
Can you please set up for ASAP labs/Vitals and 6 months physical please. Thanks!

## 2018-11-18 NOTE — Assessment & Plan Note (Signed)
Has been feeling well. Will get her in for labs ASAP and treat as needed. Call with any concerns.

## 2018-11-18 NOTE — Telephone Encounter (Signed)
Called pt to go over script screening for covid-19 no answer left voicemail. °

## 2018-11-18 NOTE — Telephone Encounter (Signed)
Scheduled

## 2018-11-18 NOTE — Assessment & Plan Note (Signed)
Encouraged diet and exercise with goal of losing 1-2lbs per week. Call with any concerns.  

## 2018-11-18 NOTE — Assessment & Plan Note (Signed)
Will get her in for BP check ASAP. Continue current regimen. Continue to monitor. Will get labs drawn. Call with any concerns.

## 2018-11-18 NOTE — Assessment & Plan Note (Signed)
Due for repeat US in September. Continue to monitor. Call with any concerns.. Will check labs ASAP.

## 2018-11-19 ENCOUNTER — Other Ambulatory Visit: Payer: Self-pay

## 2018-11-25 ENCOUNTER — Other Ambulatory Visit: Payer: Self-pay

## 2018-12-01 ENCOUNTER — Other Ambulatory Visit: Payer: Self-pay

## 2018-12-02 ENCOUNTER — Other Ambulatory Visit: Payer: Self-pay

## 2018-12-08 ENCOUNTER — Telehealth: Payer: Self-pay | Admitting: Family Medicine

## 2018-12-08 NOTE — Telephone Encounter (Signed)
Patient called and said that her tooth is hurting and would like Dr. Wynetta Emery to call her in a antibiotic like the one she gave her last time. If any questions please call patient back, thanks.

## 2018-12-09 ENCOUNTER — Ambulatory Visit (INDEPENDENT_AMBULATORY_CARE_PROVIDER_SITE_OTHER): Payer: Self-pay | Admitting: Family Medicine

## 2018-12-09 ENCOUNTER — Other Ambulatory Visit: Payer: Self-pay

## 2018-12-09 ENCOUNTER — Encounter: Payer: Self-pay | Admitting: Family Medicine

## 2018-12-09 DIAGNOSIS — J441 Chronic obstructive pulmonary disease with (acute) exacerbation: Secondary | ICD-10-CM

## 2018-12-09 DIAGNOSIS — K047 Periapical abscess without sinus: Secondary | ICD-10-CM

## 2018-12-09 MED ORDER — PREDNISONE 50 MG PO TABS: 50.0000 mg | ORAL_TABLET | Freq: Every day | ORAL | 0 refills | Status: DC

## 2018-12-09 MED ORDER — AMOXICILLIN-POT CLAVULANATE 875-125 MG PO TABS: 1.0000 | ORAL_TABLET | Freq: Two times a day (BID) | ORAL | 0 refills | Status: DC

## 2018-12-09 NOTE — Telephone Encounter (Signed)
Seen today. 

## 2018-12-09 NOTE — Progress Notes (Signed)
There were no vitals taken for this visit.   Subjective:    Patient ID: Monica Hull, female    DOB: [birth year], 58 y.o.   MRN: [id number]  HPI: Monica Hull is a 58 y.o. female  Chief Complaint  Patient presents with  . Facial Pain    Right side, patient states it started over the weekend. States it is constantly aching.    DENTAL PAIN Duration: about 3-4 days Involved teeth: right and lower Dentist evaluation: no Mechanism of injury:  no trauma Onset: sudden Severity: severe Quality: aching  Frequency: constant Radiation: into her ear Aggravating factors: cold, heat, hard foods and chewing Alleviating factors: nothing Status: worse Treatments attempted: APAP Relief with NSAIDs?: No NSAIDs Taken Fevers: no Swelling: yes Redness: yes- on the gum Paresthesias / decreased sensation: no Sinus pressure: no  UPPER RESPIRATORY TRACT INFECTION Duration: about a week Worst symptom: cough Fever: no Cough: yes Shortness of breath: no Wheezing: yes Chest pain: no Chest tightness: yes Chest congestion: yes Nasal congestion: no Runny nose: no Post nasal drip: no Sneezing: no Sore throat: no Swollen glands: no Sinus pressure: no Headache: no Face pain: yes Toothache: yes Ear pain: yes "right Ear pressure: yes "right Eyes red/itching:no Eye drainage/crusting: no  Vomiting: no Rash: no Fatigue: yes Sick contacts: no Strep contacts: no  Context: worse Recurrent sinusitis: no Relief with OTC cold/cough medications: no  Treatments attempted: tylenol   Relevant past medical, surgical, family and social history reviewed and updated as indicated. Interim medical history since our last visit reviewed. Allergies and medications reviewed and updated.  Review of Systems  Constitutional: Negative.   HENT: Positive for dental problem and ear pain. Negative for congestion, drooling, ear discharge, facial swelling, hearing loss, mouth sores,  nosebleeds, postnasal drip, rhinorrhea, sinus pressure, sinus pain, sneezing, sore throat, tinnitus, trouble swallowing and voice change.   Eyes: Negative.   Respiratory: Positive for cough. Negative for apnea, choking, chest tightness, shortness of breath, wheezing and stridor.   Cardiovascular: Negative.   Neurological: Negative.   Psychiatric/Behavioral: Negative.     Per HPI unless specifically indicated above     Objective:    There were no vitals taken for this visit.  Wt Readings from Last 3 Encounters:  09/10/18 187 lb (84.8 kg)  08/26/18 187 lb 3.2 oz (84.9 kg)  07/21/18 188 lb 3 oz (85.4 kg)    Physical Exam Vitals signs and nursing note reviewed.  Pulmonary:     Effort: Pulmonary effort is normal. No respiratory distress.     Comments: Speaking in full sentences Neurological:     Mental Status: She is alert.  Psychiatric:        Mood and Affect: Mood normal.        Behavior: Behavior normal.        Thought Content: Thought content normal.        Judgment: Judgment normal.     Results for orders placed or performed in visit on 08/26/18  Veritor Flu A/B Waived  Result Value Ref Range   Influenza A Negative Negative   Influenza B Negative Negative      Assessment & Plan:   Problem List Items Addressed This Visit      Respiratory   COPD (chronic obstructive pulmonary disease) ([place])   Relevant Medications   predniSONE (DELTASONE) 50 MG tablet    Other Visit Diagnoses    Dental abscess    -  Primary   Will treat with  augmentin. Encouraged patient to follow up with her dentist. Await their input.       Follow up plan: Return if symptoms worsen or fail to improve.    . This visit was completed via telephone due to the restrictions of the COVID-19 pandemic. All issues as above were discussed and addressed but no physical exam was performed. If it was felt that the patient should be evaluated in the office, they were directed there. The patient verbally  consented to this visit. Patient was unable to complete an audio/visual visit due to Lack of equipment. Due to the catastrophic nature of the COVID-19 pandemic, this visit was done through audio contact only. . Location of the patient: home . Location of the provider: work . Those involved with this call:  . Provider: Olevia PerchesMegan Johnson, DO . CMA: Sheilah MinsJada Fox, CMA . Front Desk/Registration: Adela Portshristan Williamson  . Time spent on call: 25 minutes on the phone discussing health concerns. 40 minutes total spent in review of patient's record and preparation of their chart.

## 2018-12-18 ENCOUNTER — Encounter: Payer: Self-pay | Admitting: Family Medicine

## 2018-12-19 MED ORDER — NYSTATIN 100000 UNIT/ML MT SUSP: 5.0000 mL | Freq: Four times a day (QID) | OROMUCOSAL | 0 refills | Status: DC

## 2019-01-23 ENCOUNTER — Ambulatory Visit
Admission: RE | Admit: 2019-01-23 | Discharge: 2019-01-23 | Disposition: A | Discharge status: Home / Self Care | Payer: Self-pay | Source: Ambulatory Visit | Attending: Family Medicine | Admitting: Family Medicine

## 2019-01-23 ENCOUNTER — Encounter: Payer: Self-pay | Admitting: Family Medicine

## 2019-01-23 ENCOUNTER — Ambulatory Visit (INDEPENDENT_AMBULATORY_CARE_PROVIDER_SITE_OTHER): Payer: Self-pay | Admitting: Family Medicine

## 2019-01-23 ENCOUNTER — Other Ambulatory Visit: Payer: Self-pay

## 2019-01-23 VITALS — BP 124/75 | HR 74 | Temp 97.1°F | Ht 62.0 in | Wt 189.0 lb

## 2019-01-23 DIAGNOSIS — R51 Headache: Secondary | ICD-10-CM | POA: Insufficient documentation

## 2019-01-23 DIAGNOSIS — R251 Tremor, unspecified: Secondary | ICD-10-CM

## 2019-01-23 DIAGNOSIS — J441 Chronic obstructive pulmonary disease with (acute) exacerbation: Secondary | ICD-10-CM

## 2019-01-23 DIAGNOSIS — R519 Headache, unspecified: Secondary | ICD-10-CM

## 2019-01-23 MED ORDER — PREDNISONE 50 MG PO TABS: ORAL_TABLET | ORAL | 0 refills | Status: DC

## 2019-01-23 NOTE — Progress Notes (Signed)
BP 124/75 (BP Location: Left Arm, Patient Position: Sitting, Cuff Size: Normal)   Pulse 74   Temp (!) 97.1 F (36.2 C) (Oral)   Ht 5\' 2"  (1.575 m)   Wt 189 lb (85.7 kg)   SpO2 97%   BMI 34.57 kg/m    Subjective:    Patient ID: Monica Hull, female    DOB: Oct 05, 1960, 58 y.o.   MRN: [id number]  HPI: Monica CoddingCynthia Horton Meskill is a 58 y.o. female  Chief Complaint  Patient presents with  . Shaking    Starting shaking. Patient states she walked to mailbox, came back inside and sat down, then patient started to shake uncontrollably.  . Headache    Patient states last 2 weeks she's been waking up with headaches almost every day.  . Fatigue   Patient presenting today with multiple new concerns.   States the past 2 weeks she's been waking up with daily headaches that last most of the day. Thinks it's because she's not been sleeping well at night. Denies visual issues, confusion, gait issues/weakness, speech changes. Not taking anything for these headaches.   Also notes an episode yesterday after a short walk to the mailbox where she began to shake uncontrollably that lasted several minutes. Started in her hands and soon become most of body. She was alert and aware through the episode. Did not bite tongue, lose consciousness, lose bowels or bladder. No hx of seizures or tremor in the past.   Also notes generalized fatigue the past week without any routine changes. Does endorse difficulty sleeping but has that fairly often. Her breathing has been worse than usual the past week as well. Chest tightness, productive cough, SOB. Hx of severe COPD. Compliant with inhaler regimen. Daily smoker, no desire to quit. Denies fevers, chills.   Relevant past medical, surgical, family and social history reviewed and updated as indicated. Interim medical history since our last visit reviewed. Allergies and medications reviewed and updated.  Review of Systems  Per HPI unless specifically  indicated above     Objective:    BP 124/75 (BP Location: Left Arm, Patient Position: Sitting, Cuff Size: Normal)   Pulse 74   Temp (!) 97.1 F (36.2 C) (Oral)   Ht 5\' 2"  (1.575 m)   Wt 189 lb (85.7 kg)   SpO2 97%   BMI 34.57 kg/m   Wt Readings from Last 3 Encounters:  01/23/19 189 lb (85.7 kg)  09/10/18 187 lb (84.8 kg)  08/26/18 187 lb 3.2 oz (84.9 kg)    Physical Exam Vitals signs and nursing note reviewed.  Constitutional:      Appearance: Normal appearance. She is not ill-appearing.  HENT:     Head: Atraumatic.  Eyes:     Extraocular Movements: Extraocular movements intact.     Conjunctiva/sclera: Conjunctivae normal.  Neck:     Musculoskeletal: Normal range of motion and neck supple.  Cardiovascular:     Rate and Rhythm: Normal rate and regular rhythm.     Heart sounds: Normal heart sounds.  Pulmonary:     Effort: Pulmonary effort is normal. No respiratory distress.     Breath sounds: Wheezing (significant, diffuse) present.  Musculoskeletal: Normal range of motion.  Skin:    General: Skin is warm and dry.  Neurological:     General: No focal deficit present.     Mental Status: She is alert and oriented to person, place, and time.     Gait: Gait normal.  Psychiatric:  Mood and Affect: Mood normal.        Thought Content: Thought content normal.        Judgment: Judgment normal.     Results for orders placed or performed in visit on 08/26/18  Veritor Flu A/B Waived  Result Value Ref Range   Influenza A Negative Negative   Influenza B Negative Negative      Assessment & Plan:   Problem List Items Addressed This Visit      Respiratory   COPD (chronic obstructive pulmonary disease) ([place])    With current exacerbation. Obtain CXR, start prednisone, may need abx if not improving. Continue inhaler regimen      Relevant Medications   predniSONE (DELTASONE) 50 MG tablet   Other Relevant Orders   DG Chest 2 View    Other Visit Diagnoses     Tremor    -  Primary   Recommended ER for stat labs and imaging, pt declines. Will obtain basic labs and stat CT head. Pt aware to go to ER if worsening in meantime   Relevant Orders   CBC with Differential/Platelet   Comprehensive metabolic panel   CT Head Wo Contrast   Acute nonintractable headache, unspecified headache type       Will obtains Stat CT. Discussed hydration, rest, and ER if sxs worsening prior to getting scan results. Declines going straight to ER today   Relevant Orders   CT Head Wo Contrast       Follow up plan: Return if symptoms worsen or fail to improve.

## 2019-01-23 NOTE — Assessment & Plan Note (Signed)
With current exacerbation. Obtain CXR, start prednisone, may need abx if not improving. Continue inhaler regimen

## 2019-01-24 LAB — CBC WITH DIFFERENTIAL/PLATELET
Basophils Absolute: 0.1 10*3/uL (ref 0.0–0.2)
Basos: 1 %
EOS (ABSOLUTE): 0.4 10*3/uL (ref 0.0–0.4)
Eos: 4 %
Hematocrit: 45.2 % (ref 34.0–46.6)
Hemoglobin: 16 g/dL — ABNORMAL HIGH (ref 11.1–15.9)
Immature Grans (Abs): 0 10*3/uL (ref 0.0–0.1)
Immature Granulocytes: 0 %
Lymphocytes Absolute: 3.2 10*3/uL — ABNORMAL HIGH (ref 0.7–3.1)
Lymphs: 34 %
MCH: 30.8 pg (ref 26.6–33.0)
MCHC: 35.4 g/dL (ref 31.5–35.7)
MCV: 87 fL (ref 79–97)
Monocytes Absolute: 0.7 10*3/uL (ref 0.1–0.9)
Monocytes: 8 %
Neutrophils Absolute: 5 10*3/uL (ref 1.4–7.0)
Neutrophils: 53 %
Platelets: 281 10*3/uL (ref 150–450)
RBC: 5.2 x10E6/uL (ref 3.77–5.28)
RDW: 12.6 % (ref 11.7–15.4)
WBC: 9.5 10*3/uL (ref 3.4–10.8)

## 2019-01-24 LAB — COMPREHENSIVE METABOLIC PANEL
ALT: 61 IU/L — ABNORMAL HIGH (ref 0–32)
AST: 50 IU/L — ABNORMAL HIGH (ref 0–40)
Albumin/Globulin Ratio: 1.6 (ref 1.2–2.2)
Albumin: 4.2 g/dL (ref 3.8–4.9)
Alkaline Phosphatase: 129 IU/L — ABNORMAL HIGH (ref 39–117)
BUN/Creatinine Ratio: 13 (ref 9–23)
BUN: 9 mg/dL (ref 6–24)
Bilirubin Total: 0.4 mg/dL (ref 0.0–1.2)
CO2: 24 mmol/L (ref 20–29)
Calcium: 9.7 mg/dL (ref 8.7–10.2)
Chloride: 101 mmol/L (ref 96–106)
Creatinine, Ser: 0.67 mg/dL (ref 0.57–1.00)
GFR calc Af Amer: 111 mL/min/{1.73_m2} (ref >59)
GFR calc non Af Amer: 97 mL/min/{1.73_m2} (ref >59)
Globulin, Total: 2.7 g/dL (ref 1.5–4.5)
Glucose: 86 mg/dL (ref 65–99)
Potassium: 4.6 mmol/L (ref 3.5–5.2)
Sodium: 142 mmol/L (ref 134–144)
Total Protein: 6.9 g/dL (ref 6.0–8.5)

## 2019-01-26 ENCOUNTER — Telehealth: Payer: Self-pay | Admitting: Family Medicine

## 2019-01-26 NOTE — Telephone Encounter (Signed)
Patient notified and verbalized understanding. 

## 2019-01-26 NOTE — Telephone Encounter (Signed)
Please let her know that her CT head, chest x-ray, and labs were all normal - great news!! She should keep [place] posted if she continues to have the shaking episodes or if her breathing does not improve on the prednisone

## 2019-02-16 ENCOUNTER — Telehealth: Payer: Self-pay | Admitting: Family Medicine

## 2019-02-16 NOTE — Telephone Encounter (Signed)
Called and left a message asking patient to return my call.  

## 2019-02-16 NOTE — Telephone Encounter (Signed)
Pt's husband requesting call back re: medication to treat Pleurisy.

## 2019-02-17 NOTE — Telephone Encounter (Signed)
Spoke with patient's husband, she is having pain, would like to see if she can have an antibiotic. She has finished the prednisone. Cough is a little better. Kristopher Oppenheim [phone]

## 2019-02-17 NOTE — Telephone Encounter (Signed)
pts husband called to receive an update. Please advise.

## 2019-02-18 MED ORDER — DOXYCYCLINE HYCLATE 100 MG PO TABS: 100.0000 mg | ORAL_TABLET | Freq: Two times a day (BID) | ORAL | 0 refills | Status: DC

## 2019-02-18 NOTE — Telephone Encounter (Signed)
Patient notified

## 2019-02-18 NOTE — Telephone Encounter (Signed)
Abx sent for her, but we haven't seen her in almost a month for this so if she's not feeling any better she really needs to come in to be evaluated

## 2019-02-19 ENCOUNTER — Telehealth: Payer: Self-pay | Admitting: Family Medicine

## 2019-02-19 DIAGNOSIS — R109 Unspecified abdominal pain: Secondary | ICD-10-CM

## 2019-02-19 NOTE — Telephone Encounter (Signed)
Copied from [place] [id number]. Topic: Appointment Scheduling - Scheduling Inquiry for Clinic >> Feb 19, 2019  7:49 AM Rayann Heman wrote: Reason for CRM: pt called and stated that she is having right side pain and would like to schedule a virtual appointment. Please advise >> Feb 19, 2019 11:34 AM Don Perking M wrote: Please advise.

## 2019-02-19 NOTE — Telephone Encounter (Signed)
OK to scheduled, but will need to come in to drop off a urine.

## 2019-02-20 ENCOUNTER — Ambulatory Visit (INDEPENDENT_AMBULATORY_CARE_PROVIDER_SITE_OTHER): Payer: Self-pay | Admitting: Nurse Practitioner

## 2019-02-20 ENCOUNTER — Ambulatory Visit
Admission: RE | Admit: 2019-02-20 | Discharge: 2019-02-20 | Disposition: A | Discharge status: Home / Self Care | Payer: Self-pay | Source: Ambulatory Visit | Attending: Nurse Practitioner | Admitting: Nurse Practitioner

## 2019-02-20 ENCOUNTER — Encounter: Payer: Self-pay | Admitting: Nurse Practitioner

## 2019-02-20 ENCOUNTER — Other Ambulatory Visit: Payer: Self-pay

## 2019-02-20 DIAGNOSIS — F17219 Nicotine dependence, cigarettes, with unspecified nicotine-induced disorders: Secondary | ICD-10-CM

## 2019-02-20 DIAGNOSIS — R109 Unspecified abdominal pain: Secondary | ICD-10-CM

## 2019-02-20 DIAGNOSIS — J441 Chronic obstructive pulmonary disease with (acute) exacerbation: Secondary | ICD-10-CM | POA: Insufficient documentation

## 2019-02-20 LAB — UA/M W/RFLX CULTURE, ROUTINE
Bilirubin, UA: NEGATIVE
Glucose, UA: NEGATIVE
Ketones, UA: NEGATIVE
Leukocytes,UA: NEGATIVE
Nitrite, UA: NEGATIVE
Protein,UA: NEGATIVE
Specific Gravity, UA: 1.025 (ref 1.005–1.030)
Urobilinogen, Ur: 1 mg/dL (ref 0.2–1.0)
pH, UA: 5.5 (ref 5.0–7.5)

## 2019-02-20 LAB — MICROSCOPIC EXAMINATION
RBC, Urine: NONE SEEN /hpf (ref 0–2)
WBC, UA: NONE SEEN /hpf (ref 0–5)

## 2019-02-20 MED ORDER — PREDNISONE 50 MG PO TABS: ORAL_TABLET | ORAL | 0 refills | Status: DC

## 2019-02-20 MED ORDER — AZITHROMYCIN 250 MG PO TABS: ORAL_TABLET | ORAL | 0 refills | Status: DC

## 2019-02-20 NOTE — Assessment & Plan Note (Signed)
With COPD.  I have recommended complete cessation of tobacco use. I have discussed various options available for assistance with tobacco cessation including over the counter methods (Nicotine gum, patch and lozenges). We also discussed prescription options (Chantix, Nicotine Inhaler / Nasal Spray). The patient is not interested in pursuing any prescription tobacco cessation options at this time.  

## 2019-02-20 NOTE — Progress Notes (Signed)
There were no vitals taken for this visit.   Subjective:    Patient ID: Monica Hull, female    DOB: [birth year], 59 y.o.   MRN: [id number]  HPI: Monica Hull is a 58 y.o. female  Chief Complaint  Patient presents with  . Urinary Tract Infection    pt states she thinks she has pleurisy, states she has been in a lot of pain for the past week   COUGH WITH RIGHT SIDE PAIN Reports she has been in a lot of pain over past week.  Was seen 01/23/2019 for COPD exacerbation and treated with Prednisone.  Was prescribed Doxycycline on 02/18/2019.  States when she coughs or takes a deep breath it hurts under right breast and radiates around to back.  Has underlying COPD, smokes about 1 1/ 2 PPD.  States the pain is constantly hurting, does not change.  Has tried Tylenol, with no benefit, also using heating pad.  This pain started after she took Prednisone.  Has had pleurisy before and was treated with Zpack and Prednisone, which "made it better".  Has Biaxin on allergy list, but reports she has taken Zpack many times without issue, last 10/04/17.  States it feels like when she had pleurisy before.  Denies urinary symptoms, N&V, abdominal pain, or lower back pain. Duration: days Wheezing: no Shortness of breath: no Chest pain: no Chest tightness:no Nasal congestion: no Runny nose: yes Postnasal drip: yes Frequent throat clearing or swallowing: no Hemoptysis: no Fevers: no Night sweats: no Weight loss: no Heartburn: no Recent foreign travel: no Tuberculosis contacts: no   Relevant past medical, surgical, family and social history reviewed and updated as indicated. Interim medical history since our last visit reviewed. Allergies and medications reviewed and updated.  Review of Systems  Constitutional: Negative for activity change, appetite change, diaphoresis, fatigue and fever.  Respiratory: Positive for cough. Negative for chest tightness, shortness of breath and  wheezing.   Cardiovascular: Negative for chest pain, palpitations and leg swelling.  Gastrointestinal: Negative for abdominal distention, abdominal pain, constipation, diarrhea, nausea and vomiting.  Neurological: Negative for dizziness, syncope, weakness, light-headedness, numbness and headaches.  Psychiatric/Behavioral: Negative.     Per HPI unless specifically indicated above     Objective:    There were no vitals taken for this visit.  Wt Readings from Last 3 Encounters:  01/23/19 189 lb (85.7 kg)  09/10/18 187 lb (84.8 kg)  08/26/18 187 lb 3.2 oz (84.9 kg)    Physical Exam Vitals signs and nursing note reviewed.  Constitutional:      General: She is awake. She is not in acute distress.    Appearance: She is well-developed and overweight. She is not ill-appearing.  HENT:     Head: Normocephalic.     Right Ear: Hearing normal.     Left Ear: Hearing normal.  Eyes:     General: Lids are normal.        Right eye: No discharge.        Left eye: No discharge.     Conjunctiva/sclera: Conjunctivae normal.  Neck:     Musculoskeletal: Normal range of motion.  Cardiovascular:     Comments: Unable to auscultate due to virtual exam only  Pulmonary:     Effort: Pulmonary effort is normal. No accessory muscle usage or respiratory distress.     Comments: Unable to auscultate due to virtual exam only.  No SOB with talking, mild hoarseness noted and intermittent nonproductive cough. Musculoskeletal:  Arms:  Neurological:     Mental Status: She is alert and oriented to person, place, and time.  Psychiatric:        Attention and Perception: Attention normal.        Mood and Affect: Mood normal.        Behavior: Behavior normal. Behavior is cooperative.        Thought Content: Thought content normal.        Judgment: Judgment normal.     Results for orders placed or performed in visit on 02/20/19  Microscopic Examination   URINE  Result Value Ref Range   WBC, UA None  seen 0 - 5 /hpf   RBC None seen 0 - 2 /hpf   Epithelial Cells (non renal) 0-10 0 - 10 /hpf   Mucus, UA Present (A) Not Estab.   Bacteria, UA Few (A) None seen/Few  UA/M w/rflx Culture, Routine   Specimen: Urine   URINE  Result Value Ref Range   Specific Gravity, UA 1.025 1.005 - 1.030   pH, UA 5.5 5.0 - 7.5   Color, UA Yellow Yellow   Appearance Ur Clear Clear   Leukocytes,UA Negative Negative   Protein,UA Negative Negative/Trace   Glucose, UA Negative Negative   Ketones, UA Negative Negative   RBC, UA Trace (A) Negative   Bilirubin, UA Negative Negative   Urobilinogen, Ur 1.0 0.2 - 1.0 mg/dL   Nitrite, UA Negative Negative   Microscopic Examination See below:       Assessment & Plan:   Problem List Items Addressed This Visit      Respiratory   COPD exacerbation (HCC) - Primary    Acute, suspect some pleuritic pain present from recent exacerbation.  Will continue current Doxycyline and add on Azithromycin and Prednisone for 5 days (scripts sent).  Repeat CXR at this time.  Have recommend she continue cough medication at home as needed + tylenol and heating pad.  May use Tizanidine as needed (currently on med list).  Return in one week for follow-up or sooner if worsening.  She is aware to immediately go to ER over w/e if worsening symptoms.        Relevant Medications   predniSONE (DELTASONE) 50 MG tablet   azithromycin (ZITHROMAX) 250 MG tablet   Other Relevant Orders   DG Chest 2 View     Nervous and Auditory   Nicotine dependence, cigarettes, w unsp disorders    With COPD.  I have recommended complete cessation of tobacco use. I have discussed various options available for assistance with tobacco cessation including over the counter methods (Nicotine gum, patch and lozenges). We also discussed prescription options (Chantix, Nicotine Inhaler / Nasal Spray). The patient is not interested in pursuing any prescription tobacco cessation options at this time.            Follow up plan: Return in about 1 week (around 02/27/2019) for cough follow-up.

## 2019-02-20 NOTE — Patient Instructions (Signed)

## 2019-02-20 NOTE — Assessment & Plan Note (Signed)
Acute, suspect some pleuritic pain present from recent exacerbation.  Will continue current Doxycyline and add on Azithromycin and Prednisone for 5 days (scripts sent).  Repeat CXR at this time.  Have recommend she continue cough medication at home as needed + tylenol and heating pad.  May use Tizanidine as needed (currently on med list).  Return in one week for follow-up or sooner if worsening.  She is aware to immediately go to ER over w/e if worsening symptoms.

## 2019-02-23 ENCOUNTER — Encounter: Payer: Self-pay | Admitting: Family Medicine

## 2019-02-27 ENCOUNTER — Other Ambulatory Visit: Payer: Self-pay

## 2019-02-27 DIAGNOSIS — E119 Type 2 diabetes mellitus without complications: Secondary | ICD-10-CM | POA: Diagnosis not present

## 2019-02-27 DIAGNOSIS — J4530 Mild persistent asthma, uncomplicated: Secondary | ICD-10-CM

## 2019-02-27 DIAGNOSIS — E041 Nontoxic single thyroid nodule: Secondary | ICD-10-CM

## 2019-02-27 DIAGNOSIS — E782 Mixed hyperlipidemia: Secondary | ICD-10-CM

## 2019-02-27 DIAGNOSIS — I10 Essential (primary) hypertension: Secondary | ICD-10-CM

## 2019-02-27 DIAGNOSIS — N393 Stress incontinence (female) (male): Secondary | ICD-10-CM

## 2019-02-27 DIAGNOSIS — J441 Chronic obstructive pulmonary disease with (acute) exacerbation: Secondary | ICD-10-CM

## 2019-02-27 LAB — MICROALBUMIN, URINE WAIVED
Creatinine, Urine Waived: 200 mg/dL (ref 10–300)
Microalb, Ur Waived: 30 mg/L — ABNORMAL HIGH (ref 0–19)
Microalb/Creat Ratio: <30 mg/g (ref <30)

## 2019-02-27 LAB — BAYER DCA HB A1C WAIVED: HB A1C (BAYER DCA - WAIVED): 7.4 % — ABNORMAL HIGH (ref <7.0)

## 2019-02-28 LAB — COMPREHENSIVE METABOLIC PANEL
ALT: 61 IU/L — ABNORMAL HIGH (ref 0–32)
AST: 41 IU/L — ABNORMAL HIGH (ref 0–40)
Albumin/Globulin Ratio: 1.7 (ref 1.2–2.2)
Albumin: 3.9 g/dL (ref 3.8–4.9)
Alkaline Phosphatase: 110 IU/L (ref 39–117)
BUN/Creatinine Ratio: 20 (ref 9–23)
BUN: 14 mg/dL (ref 6–24)
Bilirubin Total: 0.5 mg/dL (ref 0.0–1.2)
CO2: 23 mmol/L (ref 20–29)
Calcium: 8.8 mg/dL (ref 8.7–10.2)
Chloride: 103 mmol/L (ref 96–106)
Creatinine, Ser: 0.71 mg/dL (ref 0.57–1.00)
GFR calc Af Amer: 108 mL/min/{1.73_m2} (ref >59)
GFR calc non Af Amer: 94 mL/min/{1.73_m2} (ref >59)
Globulin, Total: 2.3 g/dL (ref 1.5–4.5)
Glucose: 158 mg/dL — ABNORMAL HIGH (ref 65–99)
Potassium: 4.2 mmol/L (ref 3.5–5.2)
Sodium: 142 mmol/L (ref 134–144)
Total Protein: 6.2 g/dL (ref 6.0–8.5)

## 2019-02-28 LAB — CBC WITH DIFFERENTIAL/PLATELET
Basophils Absolute: 0.1 10*3/uL (ref 0.0–0.2)
Basos: 1 %
EOS (ABSOLUTE): 0.3 10*3/uL (ref 0.0–0.4)
Eos: 2 %
Hematocrit: 45.1 % (ref 34.0–46.6)
Hemoglobin: 15.2 g/dL (ref 11.1–15.9)
Immature Grans (Abs): 0.1 10*3/uL (ref 0.0–0.1)
Immature Granulocytes: 1 %
Lymphocytes Absolute: 4.4 10*3/uL — ABNORMAL HIGH (ref 0.7–3.1)
Lymphs: 41 %
MCH: 30.5 pg (ref 26.6–33.0)
MCHC: 33.7 g/dL (ref 31.5–35.7)
MCV: 91 fL (ref 79–97)
Monocytes Absolute: 0.6 10*3/uL (ref 0.1–0.9)
Monocytes: 6 %
Neutrophils Absolute: 5.4 10*3/uL (ref 1.4–7.0)
Neutrophils: 49 %
Platelets: 248 10*3/uL (ref 150–450)
RBC: 4.98 x10E6/uL (ref 3.77–5.28)
RDW: 13 % (ref 11.7–15.4)
WBC: 10.9 10*3/uL — ABNORMAL HIGH (ref 3.4–10.8)

## 2019-02-28 LAB — LIPID PANEL W/O CHOL/HDL RATIO
Cholesterol, Total: 179 mg/dL (ref 100–199)
HDL: 53 mg/dL (ref >39)
LDL Chol Calc (NIH): 95 mg/dL (ref 0–99)
Triglycerides: 179 mg/dL — ABNORMAL HIGH (ref 0–149)
VLDL Cholesterol Cal: 31 mg/dL (ref 5–40)

## 2019-02-28 LAB — TSH: TSH: 3.35 u[IU]/mL (ref 0.450–4.500)

## 2019-03-02 ENCOUNTER — Ambulatory Visit: Payer: Self-pay | Admitting: Family Medicine

## 2019-03-05 ENCOUNTER — Encounter: Payer: Self-pay | Admitting: Family Medicine

## 2019-03-05 ENCOUNTER — Ambulatory Visit (INDEPENDENT_AMBULATORY_CARE_PROVIDER_SITE_OTHER): Payer: Self-pay | Admitting: Family Medicine

## 2019-03-05 ENCOUNTER — Other Ambulatory Visit: Payer: Self-pay

## 2019-03-05 VITALS — BP 156/96 | HR 70 | Temp 98.0°F

## 2019-03-05 DIAGNOSIS — J441 Chronic obstructive pulmonary disease with (acute) exacerbation: Secondary | ICD-10-CM

## 2019-03-05 DIAGNOSIS — Z23 Encounter for immunization: Secondary | ICD-10-CM

## 2019-03-05 DIAGNOSIS — F17219 Nicotine dependence, cigarettes, with unspecified nicotine-induced disorders: Secondary | ICD-10-CM

## 2019-03-05 DIAGNOSIS — R251 Tremor, unspecified: Secondary | ICD-10-CM

## 2019-03-05 MED ORDER — PREDNISONE 50 MG PO TABS: ORAL_TABLET | ORAL | 0 refills | Status: DC

## 2019-03-05 NOTE — Progress Notes (Signed)
BP (!) 156/96   Pulse 70   Temp 98 F (36.7 C)   SpO2 (!) 70%    Subjective:    Patient ID: Monica Hull, female    DOB: 1961/04/06, 58 y.o.   MRN: [id number]  HPI: Monica Hull is a 58 y.o. female  Chief Complaint  Patient presents with  . Cough   Tremor- comes and goes. Unsure if it gets better when she reaches for something. Mom would shake (had diabetes) and dad would shake (drinks a lot). Doesn't drink alcohol, so she doesn't know if it gets any better. This has been going on for a couple of months and seems like it's been getting worse. She hasn't noticed it anything makes it better or worse.    Cough is still there. Has cut down to smoking to lights. Down to 1ppd. No fevers. Continues with some wheezing and coughing. No other concerns or complaints at this time.   Relevant past medical, surgical, family and social history reviewed and updated as indicated. Interim medical history since our last visit reviewed. Allergies and medications reviewed and updated.  Review of Systems  Constitutional: Positive for fatigue. Negative for activity change, appetite change, chills, diaphoresis, fever and unexpected weight change.  HENT: Positive for congestion and sore throat. Negative for dental problem, drooling, ear discharge, ear pain, facial swelling, hearing loss, mouth sores, nosebleeds, postnasal drip, rhinorrhea, sinus pressure, sinus pain, sneezing, tinnitus, trouble swallowing and voice change.   Eyes: Negative.   Respiratory: Positive for cough, chest tightness, shortness of breath and wheezing. Negative for apnea, choking and stridor.   Cardiovascular: Negative.   Neurological: Positive for tremors.  Psychiatric/Behavioral: Negative.     Per HPI unless specifically indicated above     Objective:    BP (!) 156/96   Pulse 70   Temp 98 F (36.7 C)   SpO2 (!) 70%   Wt Readings from Last 3 Encounters:  01/23/19 189 lb (85.7 kg)  09/10/18 187 lb  (84.8 kg)  08/26/18 187 lb 3.2 oz (84.9 kg)    Physical Exam Vitals signs and nursing note reviewed.  Constitutional:      General: She is not in acute distress.    Appearance: Normal appearance. She is not ill-appearing, toxic-appearing or diaphoretic.  HENT:     Head: Normocephalic and atraumatic.     Right Ear: External ear normal.     Left Ear: External ear normal.     Nose: Nose normal.     Mouth/Throat:     Mouth: Mucous membranes are moist.     Pharynx: Oropharynx is clear.  Eyes:     General: No scleral icterus.       Right eye: No discharge.        Left eye: No discharge.     Extraocular Movements: Extraocular movements intact.     Conjunctiva/sclera: Conjunctivae normal.     Pupils: Pupils are equal, round, and reactive to light.  Neck:     Musculoskeletal: Normal range of motion and neck supple.  Cardiovascular:     Rate and Rhythm: Normal rate and regular rhythm.     Pulses: Normal pulses.     Heart sounds: Normal heart sounds. No murmur. No friction rub. No gallop.   Pulmonary:     Effort: Pulmonary effort is normal. No respiratory distress.     Breath sounds: Normal breath sounds. No stridor. No wheezing, rhonchi or rales.  Chest:     Chest wall:  No tenderness.  Musculoskeletal: Normal range of motion.  Skin:    General: Skin is warm and dry.     Capillary Refill: Capillary refill takes less than 2 seconds.     Coloration: Skin is not jaundiced or pale.     Findings: No bruising, erythema, lesion or rash.  Neurological:     General: No focal deficit present.     Mental Status: She is alert and oriented to person, place, and time. Mental status is at baseline.  Psychiatric:        Mood and Affect: Mood normal.        Behavior: Behavior normal.        Thought Content: Thought content normal.        Judgment: Judgment normal.     Results for orders placed or performed in visit on 02/27/19  TSH  Result Value Ref Range   TSH 3.350 0.450 - 4.500 uIU/mL   Microalbumin, Urine Waived  Result Value Ref Range   Microalb, Ur Waived 30 (H) 0 - 19 mg/L   Creatinine, Urine Waived 200 10 - 300 mg/dL   Microalb/Creat Ratio <30 <30 mg/g  Lipid Panel w/o Chol/HDL Ratio  Result Value Ref Range   Cholesterol, Total 179 100 - 199 mg/dL   Triglycerides 729 (H) 0 - 149 mg/dL   HDL 53 >02 mg/dL   VLDL Cholesterol Cal 31 5 - 40 mg/dL   LDL Chol Calc (NIH) 95 0 - 99 mg/dL  Comprehensive metabolic panel  Result Value Ref Range   Glucose 158 (H) 65 - 99 mg/dL   BUN 14 6 - 24 mg/dL   Creatinine, Ser 1.11 0.57 - 1.00 mg/dL   GFR calc non Af Amer 94 >59 mL/min/1.73   GFR calc Af Amer 108 >59 mL/min/1.73   BUN/Creatinine Ratio 20 9 - 23   Sodium 142 134 - 144 mmol/L   Potassium 4.2 3.5 - 5.2 mmol/L   Chloride 103 96 - 106 mmol/L   CO2 23 20 - 29 mmol/L   Calcium 8.8 8.7 - 10.2 mg/dL   Total Protein 6.2 6.0 - 8.5 g/dL   Albumin 3.9 3.8 - 4.9 g/dL   Globulin, Total 2.3 1.5 - 4.5 g/dL   Albumin/Globulin Ratio 1.7 1.2 - 2.2   Bilirubin Total 0.5 0.0 - 1.2 mg/dL   Alkaline Phosphatase 110 39 - 117 IU/L   AST 41 (H) 0 - 40 IU/L   ALT 61 (H) 0 - 32 IU/L  CBC with Differential/Platelet  Result Value Ref Range   WBC 10.9 (H) 3.4 - 10.8 x10E3/uL   RBC 4.98 3.77 - 5.28 x10E6/uL   Hemoglobin 15.2 11.1 - 15.9 g/dL   Hematocrit 55.2 08.0 - 46.6 %   MCV 91 79 - 97 fL   MCH 30.5 26.6 - 33.0 pg   MCHC 33.7 31.5 - 35.7 g/dL   RDW 22.3 36.1 - 22.4 %   Platelets 248 150 - 450 x10E3/uL   Neutrophils 49 Not Estab. %   Lymphs 41 Not Estab. %   Monocytes 6 Not Estab. %   Eos 2 Not Estab. %   Basos 1 Not Estab. %   Neutrophils Absolute 5.4 1.4 - 7.0 x10E3/uL   Lymphocytes Absolute 4.4 (H) 0.7 - 3.1 x10E3/uL   Monocytes Absolute 0.6 0.1 - 0.9 x10E3/uL   EOS (ABSOLUTE) 0.3 0.0 - 0.4 x10E3/uL   Basophils Absolute 0.1 0.0 - 0.2 x10E3/uL   Immature Granulocytes 1 Not Estab. %  Immature Grans (Abs) 0.1 0.0 - 0.1 x10E3/uL  Bayer DCA Hb A1c Waived  Result Value Ref  Range   HB A1C (BAYER DCA - WAIVED) 7.4 (H) <7.0 %      Assessment & Plan:   Problem List Items Addressed This Visit      Respiratory   COPD exacerbation ([place]) - Primary    Will treat with prednisone burst and recheck 2 weeks. Call with any concerns.       Relevant Medications   predniSONE (DELTASONE) 50 MG tablet     Nervous and Auditory   Nicotine dependence, cigarettes, w unsp disorders    Continuing to decrease her smoking. Does not want medication at this time. Continue to monitor. Call with any concerns.        Other Visit Diagnoses    Tremor       Will continue to monitor closely. Call with any concerns.        Follow up plan: Return in about 2 weeks (around 03/19/2019) for follow up breathing.

## 2019-03-05 NOTE — Patient Instructions (Signed)

## 2019-03-08 ENCOUNTER — Encounter: Payer: Self-pay | Admitting: Family Medicine

## 2019-03-08 NOTE — Assessment & Plan Note (Signed)
Will treat with prednisone burst and recheck 2 weeks. Call with any concerns.

## 2019-03-08 NOTE — Assessment & Plan Note (Signed)
Continuing to decrease her smoking. Does not want medication at this time. Continue to monitor. Call with any concerns.

## 2019-03-23 ENCOUNTER — Other Ambulatory Visit: Payer: Self-pay

## 2019-03-23 ENCOUNTER — Encounter: Payer: Self-pay | Admitting: Family Medicine

## 2019-03-23 ENCOUNTER — Ambulatory Visit (INDEPENDENT_AMBULATORY_CARE_PROVIDER_SITE_OTHER): Payer: Self-pay | Admitting: Family Medicine

## 2019-03-23 VITALS — BP 131/86 | HR 81 | Temp 98.4°F | Ht 62.0 in | Wt 187.0 lb

## 2019-03-23 DIAGNOSIS — R251 Tremor, unspecified: Secondary | ICD-10-CM

## 2019-03-23 DIAGNOSIS — J441 Chronic obstructive pulmonary disease with (acute) exacerbation: Secondary | ICD-10-CM

## 2019-03-23 NOTE — Assessment & Plan Note (Signed)
Using her inhalers, not getting better. Unable to break exacerbation at this time. Will get her into pulmonology ASAP. Await their input.

## 2019-03-23 NOTE — Progress Notes (Signed)
BP 131/86   Pulse 81   Temp 98.4 F (36.9 C) (Oral)   Ht 5\' 2"  (1.575 m)   Wt 187 lb (84.8 kg)   SpO2 98%   BMI 34.20 kg/m    Subjective:    Patient ID: Monica Hull, female    DOB: 08-14-1960, 58 y.o.   MRN: [id number]  HPI: Terrianna Holsclaw is a 58 y.o. female  Chief Complaint  Patient presents with  . COPD    2w f/u   COPD- Breathing has not been doing well. Has finished her medicine, but breathing is still not doing well. She notes that she has been coughing, having shortness of breath and wheezing. She has not been running a fever. COPD status: uncontrolled Satisfied with current treatment?: no Oxygen use: no Dyspnea frequency: almost all the time Cough frequency: almost all the time Rescue inhaler frequency:  Several times a day Limitation of activity: yes Productive cough: yes Pneumovax: Up to Date Influenza: Up to Date   Has been having a severe tremor of her R hand. Happening all the time. Not getting better with intention. Nothing seems to help. She has been dropping things and has been very concerned about it. She is otherwise feeling well with no other concerns or complaints at this time.   Relevant past medical, surgical, family and social history reviewed and updated as indicated. Interim medical history since our last visit reviewed. Allergies and medications reviewed and updated.  Review of Systems  Constitutional: Negative.   HENT: Negative.   Respiratory: Positive for cough, chest tightness, shortness of breath and wheezing. Negative for apnea, choking and stridor.   Cardiovascular: Negative.   Gastrointestinal: Negative.   Musculoskeletal: Negative.   Skin: Negative.   Neurological: Positive for tremors. Negative for dizziness, seizures, syncope, facial asymmetry, speech difficulty, weakness, light-headedness, numbness and headaches.  Hematological: Negative.   Psychiatric/Behavioral: Negative.     Per HPI unless specifically  indicated above     Objective:    BP 131/86   Pulse 81   Temp 98.4 F (36.9 C) (Oral)   Ht 5\' 2"  (1.575 m)   Wt 187 lb (84.8 kg)   SpO2 98%   BMI 34.20 kg/m   Wt Readings from Last 3 Encounters:  03/23/19 187 lb (84.8 kg)  01/23/19 189 lb (85.7 kg)  09/10/18 187 lb (84.8 kg)    Physical Exam Vitals signs and nursing note reviewed.  Constitutional:      General: She is not in acute distress.    Appearance: Normal appearance. She is not ill-appearing, toxic-appearing or diaphoretic.  HENT:     Head: Normocephalic and atraumatic.     Right Ear: External ear normal.     Left Ear: External ear normal.     Nose: Nose normal.     Mouth/Throat:     Mouth: Mucous membranes are moist.     Pharynx: Oropharynx is clear.  Eyes:     General: No scleral icterus.       Right eye: No discharge.        Left eye: No discharge.     Extraocular Movements: Extraocular movements intact.     Conjunctiva/sclera: Conjunctivae normal.     Pupils: Pupils are equal, round, and reactive to light.  Neck:     Musculoskeletal: Normal range of motion and neck supple.  Cardiovascular:     Rate and Rhythm: Normal rate and regular rhythm.     Pulses: Normal pulses.  Heart sounds: Normal heart sounds. No murmur. No friction rub. No gallop.   Pulmonary:     Effort: Pulmonary effort is normal. No respiratory distress.     Breath sounds: No stridor. Wheezing and rhonchi present. No rales.  Chest:     Chest wall: No tenderness.  Musculoskeletal: Normal range of motion.  Skin:    General: Skin is warm and dry.     Capillary Refill: Capillary refill takes less than 2 seconds.     Coloration: Skin is not jaundiced or pale.     Findings: No bruising, erythema, lesion or rash.  Neurological:     General: No focal deficit present.     Mental Status: She is alert and oriented to person, place, and time. Mental status is at baseline.     Comments: Significant tremor of R hand  Psychiatric:        Mood  and Affect: Mood normal.        Behavior: Behavior normal.        Thought Content: Thought content normal.        Judgment: Judgment normal.     Results for orders placed or performed in visit on 02/27/19  TSH  Result Value Ref Range   TSH 3.350 0.450 - 4.500 uIU/mL  Microalbumin, Urine Waived  Result Value Ref Range   Microalb, Ur Waived 30 (H) 0 - 19 mg/L   Creatinine, Urine Waived 200 10 - 300 mg/dL   Microalb/Creat Ratio <30 <30 mg/g  Lipid Panel w/o Chol/HDL Ratio  Result Value Ref Range   Cholesterol, Total 179 100 - 199 mg/dL   Triglycerides 865 (H) 0 - 149 mg/dL   HDL 53 >78 mg/dL   VLDL Cholesterol Cal 31 5 - 40 mg/dL   LDL Chol Calc (NIH) 95 0 - 99 mg/dL  Comprehensive metabolic panel  Result Value Ref Range   Glucose 158 (H) 65 - 99 mg/dL   BUN 14 6 - 24 mg/dL   Creatinine, Ser 4.69 0.57 - 1.00 mg/dL   GFR calc non Af Amer 94 >59 mL/min/1.73   GFR calc Af Amer 108 >59 mL/min/1.73   BUN/Creatinine Ratio 20 9 - 23   Sodium 142 134 - 144 mmol/L   Potassium 4.2 3.5 - 5.2 mmol/L   Chloride 103 96 - 106 mmol/L   CO2 23 20 - 29 mmol/L   Calcium 8.8 8.7 - 10.2 mg/dL   Total Protein 6.2 6.0 - 8.5 g/dL   Albumin 3.9 3.8 - 4.9 g/dL   Globulin, Total 2.3 1.5 - 4.5 g/dL   Albumin/Globulin Ratio 1.7 1.2 - 2.2   Bilirubin Total 0.5 0.0 - 1.2 mg/dL   Alkaline Phosphatase 110 39 - 117 IU/L   AST 41 (H) 0 - 40 IU/L   ALT 61 (H) 0 - 32 IU/L  CBC with Differential/Platelet  Result Value Ref Range   WBC 10.9 (H) 3.4 - 10.8 x10E3/uL   RBC 4.98 3.77 - 5.28 x10E6/uL   Hemoglobin 15.2 11.1 - 15.9 g/dL   Hematocrit 62.9 52.8 - 46.6 %   MCV 91 79 - 97 fL   MCH 30.5 26.6 - 33.0 pg   MCHC 33.7 31.5 - 35.7 g/dL   RDW 41.3 24.4 - 01.0 %   Platelets 248 150 - 450 x10E3/uL   Neutrophils 49 Not Estab. %   Lymphs 41 Not Estab. %   Monocytes 6 Not Estab. %   Eos 2 Not Estab. %   Basos 1  Not Estab. %   Neutrophils Absolute 5.4 1.4 - 7.0 x10E3/uL   Lymphocytes Absolute 4.4 (H) 0.7 -  3.1 x10E3/uL   Monocytes Absolute 0.6 0.1 - 0.9 x10E3/uL   EOS (ABSOLUTE) 0.3 0.0 - 0.4 x10E3/uL   Basophils Absolute 0.1 0.0 - 0.2 x10E3/uL   Immature Granulocytes 1 Not Estab. %   Immature Grans (Abs) 0.1 0.0 - 0.1 x10E3/uL  Bayer DCA Hb A1c Waived  Result Value Ref Range   HB A1C (BAYER DCA - WAIVED) 7.4 (H) <7.0 %      Assessment & Plan:   Problem List Items Addressed This Visit      Respiratory   COPD (chronic obstructive pulmonary disease) (HCC) - Primary    Using her inhalers, not getting better. Unable to break exacerbation at this time. Will get her into pulmonology ASAP. Await their input.       Relevant Orders   Ambulatory referral to Pulmonology    Other Visit Diagnoses    Tremor       Will get her into see neurology- has been getting worse. Continue to monitor. Await their input.    Relevant Orders   Ambulatory referral to Neurology       Follow up plan: Return in about 4 weeks (around 04/20/2019).

## 2019-03-25 ENCOUNTER — Ambulatory Visit: Payer: Self-pay | Admitting: Pulmonary Disease

## 2019-03-25 ENCOUNTER — Other Ambulatory Visit: Payer: Self-pay

## 2019-03-25 ENCOUNTER — Telehealth: Payer: Self-pay | Admitting: *Deleted

## 2019-03-25 ENCOUNTER — Encounter: Payer: Self-pay | Admitting: Pulmonary Disease

## 2019-03-25 VITALS — BP 134/80 | HR 96 | Temp 97.8°F | Ht 62.0 in | Wt 188.8 lb

## 2019-03-25 DIAGNOSIS — F17210 Nicotine dependence, cigarettes, uncomplicated: Secondary | ICD-10-CM

## 2019-03-25 DIAGNOSIS — J449 Chronic obstructive pulmonary disease, unspecified: Secondary | ICD-10-CM

## 2019-03-25 DIAGNOSIS — Z87891 Personal history of nicotine dependence: Secondary | ICD-10-CM

## 2019-03-25 DIAGNOSIS — Z122 Encounter for screening for malignant neoplasm of respiratory organs: Secondary | ICD-10-CM

## 2019-03-25 MED ORDER — AEROCHAMBER MV MISC: 0 refills | Status: AC

## 2019-03-25 MED ORDER — SPIRIVA RESPIMAT 2.5 MCG/ACT IN AERS: 2.0000 | INHALATION_SPRAY | Freq: Every day | RESPIRATORY_TRACT | 11 refills | Status: DC

## 2019-03-25 MED ORDER — ALBUTEROL SULFATE HFA 108 (90 BASE) MCG/ACT IN AERS: 2.0000 | INHALATION_SPRAY | Freq: Four times a day (QID) | RESPIRATORY_TRACT | 6 refills | Status: DC | PRN

## 2019-03-25 NOTE — Progress Notes (Signed)
 Assessment & Plan:  1. 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 Tobacco dependence due to cigarettes   Patient Instructions  1.  Use a spacer with your Symbicort  inhaler, the appropriate dose is 2 puffs twice a day.  Rinse your mouth well after use.  2.  We will start you on Spiriva  2 puffs daily you may use that after your Symbicort  dose in the morning.  3.  You are encouraged to quit smoking this will help you heal.  4.  We will order breathing tests.  5.  We will see you in follow-up in 2 months time.  Please note: late entry documentation due to logistical difficulties during COVID-19 pandemic. This note is filed for information purposes only, and is not intended to be used for billing, nor does it represent the full scope/nature of the visit in question. Please see any associated scanned media linked to date of encounter for additional pertinent information.  Subjective:    HPI: Monica Hull is a 58 y.o. female presenting to the pulmonology clinic on 03/25/2019 with report of: pulmonary consult (per Dr. Vicci- hx of COPD. pt reports of sob with exertion and at rest, non prod cough and wheezing )     Outpatient Encounter Medications as of 03/25/2019  Medication Sig Note   [DISCONTINUED] albuterol  (PROVENTIL  HFA;VENTOLIN  HFA) 108 (90 Base) MCG/ACT inhaler Inhale 2 puffs into the lungs every 6 (six) hours as needed for wheezing or shortness of breath.    [DISCONTINUED] albuterol  (PROVENTIL ) (2.5 MG/3ML) 0.083% nebulizer solution Take 3 mLs (2.5 mg total) by nebulization every 6 (six) hours as needed for wheezing or shortness of breath.    [DISCONTINUED] albuterol  (VENTOLIN  HFA) 108 (90 Base) MCG/ACT inhaler Inhale 2 puffs into the lungs every 6 (six) hours as needed for wheezing or shortness of breath.    [DISCONTINUED] atorvastatin  (LIPITOR) 20 MG tablet Take 1 tablet (20 mg total) by mouth daily.    [DISCONTINUED] budesonide -formoterol   (SYMBICORT ) 160-4.5 MCG/ACT inhaler Inhale 2 puffs into the lungs 2 (two) times daily.    [DISCONTINUED] cetirizine  (ZYRTEC ) 10 MG tablet Take 1 tablet (10 mg total) by mouth daily.    [DISCONTINUED] diclofenac  (VOLTAREN ) 50 MG EC tablet Take 1 tablet (50 mg total) by mouth 4 (four) times daily.    [DISCONTINUED] fesoterodine  (TOVIAZ ) 8 MG TB24 tablet Take 1 tablet (8 mg total) by mouth daily.    [DISCONTINUED] fluticasone  (FLONASE ) 50 MCG/ACT nasal spray Place 2 sprays into both nostrils daily.    [DISCONTINUED] lisinopril  (ZESTRIL ) 30 MG tablet Take 1 tablet (30 mg total) by mouth daily.    [DISCONTINUED] nystatin  (MYCOSTATIN ) 100000 UNIT/ML suspension Take 5 mLs (500,000 Units total) by mouth 4 (four) times daily. (Patient not taking: Reported on 05/15/2019) 01/23/2019: PRN   [DISCONTINUED] pantoprazole  (PROTONIX ) 40 MG tablet Take 1 tablet (40 mg total) by mouth every morning.    [DISCONTINUED] tiZANidine (ZANAFLEX) 4 MG capsule Take 4 mg by mouth 3 (three) times daily as needed for muscle spasms. Reported on 07/11/2015    Spacer/Aero-Holding Chambers (AEROCHAMBER MV) inhaler Use as instructed 03/27/2023: Needs refill   [DISCONTINUED] Tiotropium Bromide Monohydrate  (SPIRIVA  RESPIMAT) 2.5 MCG/ACT AERS Inhale 2 puffs into the lungs daily.    No facility-administered encounter medications on file as of 03/25/2019.      Objective:   Vitals:   03/25/19 1009  BP: 134/80  Pulse: 96  Temp: 97.8 F (36.6 C)  Height: 5' 2 (1.575 m)  Weight: 188 lb 12.8 oz (85.6 kg)  SpO2: 97%  TempSrc: Temporal  BMI (Calculated): 34.52     Physical exam documentation is limited by delayed entry of information.

## 2019-03-25 NOTE — Patient Instructions (Signed)
1.  Use a spacer with your Symbicort inhaler, the appropriate dose is 2 puffs twice a day.  Rinse your mouth well after use.  2.  We will start you on Spiriva 2 puffs daily you may use that after your Symbicort dose in the morning.  3.  You are encouraged to quit smoking this will help you heal.  4.  We will order breathing tests.  5.  We will see you in follow-up in 2 months time.

## 2019-03-25 NOTE — Telephone Encounter (Signed)
Received referral for initial lung cancer screening scan. Contacted patient and obtained smoking history,(current, 58.5 pack year) as well as answering questions related to screening process. Patient denies signs of lung cancer such as weight loss or hemoptysis. Patient denies comorbidity that would prevent curative treatment if lung cancer were found. Patient is scheduled for shared decision making visit and CT scan on 04/07/19 at 115pm.

## 2019-04-07 ENCOUNTER — Other Ambulatory Visit: Payer: Self-pay

## 2019-04-07 ENCOUNTER — Ambulatory Visit
Admission: RE | Admit: 2019-04-07 | Discharge: 2019-04-07 | Disposition: A | Discharge status: Home / Self Care | Payer: Self-pay | Source: Ambulatory Visit | Attending: Oncology | Admitting: Oncology

## 2019-04-07 ENCOUNTER — Inpatient Hospital Stay: Payer: Self-pay | Attending: Oncology | Admitting: Oncology

## 2019-04-07 DIAGNOSIS — F17210 Nicotine dependence, cigarettes, uncomplicated: Secondary | ICD-10-CM

## 2019-04-07 DIAGNOSIS — Z122 Encounter for screening for malignant neoplasm of respiratory organs: Secondary | ICD-10-CM | POA: Insufficient documentation

## 2019-04-07 DIAGNOSIS — Z87891 Personal history of nicotine dependence: Secondary | ICD-10-CM | POA: Insufficient documentation

## 2019-04-07 NOTE — Progress Notes (Signed)
Virtual Visit via Video Note  I connected with Mrs. Rosser on 04/07/19 at  1:15 PM EDT by a video enabled telemedicine application and verified that I am speaking with the correct person using two identifiers.  Location: Patient: [facility] Provider: Office   I discussed the limitations of evaluation and management by telemedicine and the availability of in person appointments. The patient expressed understanding and agreed to proceed.  I discussed the assessment and treatment plan with the patient. The patient was provided an opportunity to ask questions and all were answered. The patient agreed with the plan and demonstrated an understanding of the instructions.   The patient was advised to call back or seek an in-person evaluation if the symptoms worsen or if the condition fails to improve as anticipated.   In accordance with CMS guidelines, patient has met eligibility criteria including age, absence of signs or symptoms of lung cancer.  Social History   Tobacco Use  . Smoking status: Current Every Day Smoker    Packs/day: 1.50    Years: 39.00    Pack years: 58.50    Types: Cigarettes  . Smokeless tobacco: Never Used  Substance Use Topics  . Alcohol use: No    Alcohol/week: 0.0 standard drinks  . Drug use: No      A shared decision-making session was conducted prior to the performance of CT scan. This includes one or more decision aids, includes benefits and harms of screening, follow-up diagnostic testing, over-diagnosis, false positive rate, and total radiation exposure.   Counseling on the importance of adherence to annual lung cancer LDCT screening, impact of co-morbidities, and ability or willingness to undergo diagnosis and treatment is imperative for compliance of the program.   Counseling on the importance of continued smoking cessation for former smokers; the importance of smoking cessation for current smokers, and information about tobacco cessation interventions have been  given to patient including [place] and [facility] quit [place] programs.   Written order for lung cancer screening with LDCT has been given to the patient and any and all questions have been answered to the best of my abilities.    Yearly follow up will be coordinated by Burgess Estelle, Thoracic Navigator.  I provided 15 minutes of face-to-face video visit time during this encounter, and > 50% was spent counseling as documented under my assessment & plan.   Jacquelin Hawking, NP

## 2019-04-08 ENCOUNTER — Encounter: Payer: Self-pay | Admitting: Family Medicine

## 2019-04-08 ENCOUNTER — Telehealth: Payer: Self-pay | Admitting: *Deleted

## 2019-04-08 DIAGNOSIS — I700 Atherosclerosis of aorta: Secondary | ICD-10-CM | POA: Insufficient documentation

## 2019-04-08 NOTE — Telephone Encounter (Signed)
Notified patient of LDCT lung cancer screening program results with recommendation for 12 month follow up imaging. Also notified of incidental findings noted below and is encouraged to discuss further with PCP who will receive a copy of this note and/or the CT report. Patient verbalizes understanding.   IMPRESSION: Lung-RADS 2, benign appearance or behavior. Continue annual screening with low-dose chest CT without contrast in 12 months.  Aortic Atherosclerosis (ICD10-I70.0) and Emphysema (ICD10-J43.9).  

## 2019-04-14 ENCOUNTER — Encounter: Payer: Self-pay | Admitting: Family Medicine

## 2019-04-16 ENCOUNTER — Ambulatory Visit (INDEPENDENT_AMBULATORY_CARE_PROVIDER_SITE_OTHER): Payer: Self-pay | Admitting: Unknown Physician Specialty

## 2019-04-16 ENCOUNTER — Other Ambulatory Visit: Payer: Self-pay

## 2019-04-16 ENCOUNTER — Encounter: Payer: Self-pay | Admitting: Unknown Physician Specialty

## 2019-04-16 ENCOUNTER — Encounter: Payer: Self-pay | Admitting: Family Medicine

## 2019-04-16 DIAGNOSIS — L245 Irritant contact dermatitis due to other chemical products: Secondary | ICD-10-CM

## 2019-04-16 MED ORDER — PREDNISONE 20 MG PO TABS: 20.0000 mg | ORAL_TABLET | Freq: Every day | ORAL | 0 refills | Status: DC

## 2019-04-16 MED ORDER — TRIAMCINOLONE ACETONIDE 0.1 % EX CREA: 1.0000 "application " | TOPICAL_CREAM | Freq: Two times a day (BID) | CUTANEOUS | 0 refills | Status: DC

## 2019-04-16 NOTE — Progress Notes (Signed)
There were no vitals taken for this visit.   Subjective:    Patient ID: Monica Hull, female    DOB: 08-22-1960, 58 y.o.   MRN: [id number]  HPI: Monica Hull is a 58 y.o. female  Chief Complaint  Patient presents with  . Rash    left arm, started a couple of days ago, left arm and shoulder into neck has been hurting a week. Looks like blisters on hand and arm.      Marland Kitchen [employer] . Location of the patient: home . Location of the provider: work . Those involved with this call:  . Provider: Gabriel Cirri, DNP . CMA: Wilhemena Durie, CMA . Front Desk/Registration: Harriet Pho  . Time spent on call: 10 minutes on the phone discussing health concerns. 10 minutes total spent in review of patient's record and preparation of their chart.    States she tried arthritis cream and thinks that might have caused the rash.  States she has pain from her neck to her arm which has been long-standing.  Used to take injections at one time.     Relevant past medical, surgical, family and social history reviewed and updated as indicated. Interim medical history since our last visit reviewed. Allergies and medications reviewed and updated.  Review of Systems  Constitutional: Negative.   Respiratory: Negative.   Cardiovascular: Negative.     Per HPI unless specifically indicated above     Objective:    There were no vitals taken for this visit.  Wt Readings from Last 3 Encounters:  04/07/19 188 lb (85.3 kg)  03/25/19 188 lb 12.8 oz (85.6 kg)  03/23/19 187 lb (84.8 kg)    Physical Exam Constitutional:      General: She is not in acute distress.    Appearance: Normal appearance. She is well-developed.  HENT:     Head: Normocephalic and atraumatic.  Eyes:     General: Lids are normal. No scleral icterus.       Right eye: No discharge.        Left eye: No discharge.     Conjunctiva/sclera: Conjunctivae normal.  Cardiovascular:     Rate and Rhythm: Normal rate.   Pulmonary:     Effort: Pulmonary effort is normal.  Abdominal:     Palpations: There is no hepatomegaly or splenomegaly.  Musculoskeletal: Normal range of motion.  Skin:    Coloration: Skin is not pale.     Findings: Rash present.     Comments: Generalized rash with erythema and blistering lower arm.  This does not appear to be a dermatomal distribution but video quality poor.    Neurological:     Mental Status: She is alert and oriented to person, place, and time.  Psychiatric:        Behavior: Behavior normal.        Thought Content: Thought content normal.        Judgment: Judgment normal.     Results for orders placed or performed in visit on 02/27/19  TSH  Result Value Ref Range   TSH 3.350 0.450 - 4.500 uIU/mL  Microalbumin, Urine Waived  Result Value Ref Range   Microalb, Ur Waived 30 (H) 0 - 19 mg/L   Creatinine, Urine Waived 200 10 - 300 mg/dL   Microalb/Creat Ratio <30 <30 mg/g  Lipid Panel w/o Chol/HDL Ratio  Result Value Ref Range   Cholesterol, Total 179 100 - 199 mg/dL   Triglycerides 720 (H) 0 - 149  mg/dL   HDL 53 >39 mg/dL   VLDL Cholesterol Cal 31 5 - 40 mg/dL   LDL Chol Calc (NIH) 95 0 - 99 mg/dL  Comprehensive metabolic panel  Result Value Ref Range   Glucose 158 (H) 65 - 99 mg/dL   BUN 14 6 - 24 mg/dL   Creatinine, Ser 0.71 0.57 - 1.00 mg/dL   GFR calc non Af Amer 94 >59 mL/min/1.73   GFR calc Af Amer 108 >59 mL/min/1.73   BUN/Creatinine Ratio 20 9 - 23   Sodium 142 134 - 144 mmol/L   Potassium 4.2 3.5 - 5.2 mmol/L   Chloride 103 96 - 106 mmol/L   CO2 23 20 - 29 mmol/L   Calcium 8.8 8.7 - 10.2 mg/dL   Total Protein 6.2 6.0 - 8.5 g/dL   Albumin 3.9 3.8 - 4.9 g/dL   Globulin, Total 2.3 1.5 - 4.5 g/dL   Albumin/Globulin Ratio 1.7 1.2 - 2.2   Bilirubin Total 0.5 0.0 - 1.2 mg/dL   Alkaline Phosphatase 110 39 - 117 IU/L   AST 41 (H) 0 - 40 IU/L   ALT 61 (H) 0 - 32 IU/L  CBC with Differential/Platelet  Result Value Ref Range   WBC 10.9 (H) 3.4 -  10.8 x10E3/uL   RBC 4.98 3.77 - 5.28 x10E6/uL   Hemoglobin 15.2 11.1 - 15.9 g/dL   Hematocrit 45.1 34.0 - 46.6 %   MCV 91 79 - 97 fL   MCH 30.5 26.6 - 33.0 pg   MCHC 33.7 31.5 - 35.7 g/dL   RDW 13.0 11.7 - 15.4 %   Platelets 248 150 - 450 x10E3/uL   Neutrophils 49 Not Estab. %   Lymphs 41 Not Estab. %   Monocytes 6 Not Estab. %   Eos 2 Not Estab. %   Basos 1 Not Estab. %   Neutrophils Absolute 5.4 1.4 - 7.0 x10E3/uL   Lymphocytes Absolute 4.4 (H) 0.7 - 3.1 x10E3/uL   Monocytes Absolute 0.6 0.1 - 0.9 x10E3/uL   EOS (ABSOLUTE) 0.3 0.0 - 0.4 x10E3/uL   Basophils Absolute 0.1 0.0 - 0.2 x10E3/uL   Immature Granulocytes 1 Not Estab. %   Immature Grans (Abs) 0.1 0.0 - 0.1 x10E3/uL  Bayer DCA Hb A1c Waived  Result Value Ref Range   HB A1C (BAYER DCA - WAIVED) 7.4 (H) <7.0 %      Assessment & Plan:   Problem List Items Addressed This Visit    None    Visit Diagnoses    Irritant contact dermatitis due to other chemical products    -  Primary   Rx for prednisone 20 mg for 5 days.  Rx for topical Triamcinolone cream.  Benadryl prn.  Consider shingles for persistent symptoms.  Video quality poor       Follow up plan: Return if symptoms worsen or fail to improve.

## 2019-04-20 ENCOUNTER — Telehealth: Payer: Self-pay | Admitting: Family Medicine

## 2019-04-20 NOTE — Telephone Encounter (Signed)
She can try it. I'm not sure of the data behind it or how much it would help, but she's welcome to give it a shot

## 2019-04-20 NOTE — Telephone Encounter (Signed)
Routing to provider  

## 2019-04-20 NOTE — Telephone Encounter (Signed)
Patient notified

## 2019-04-20 NOTE — Telephone Encounter (Signed)
Pt daughter called and stated that pt is having left shoulder pain due to arthritis and would like to know if it was safe to try CBD oil. Please advise

## 2019-05-11 DIAGNOSIS — Z20828 Contact with and (suspected) exposure to other viral communicable diseases: Secondary | ICD-10-CM | POA: Diagnosis not present

## 2019-05-13 ENCOUNTER — Telehealth: Payer: Self-pay | Admitting: Family Medicine

## 2019-05-13 NOTE — Telephone Encounter (Signed)
Patient's husband requesting a refill of prednisone on her behalf due to pain in sides. Husband states that this has happened numerous times before and that Dr. Wynetta Emery is aware of issue. Patient requesting medication be sent without OV, if possible. Please advise.

## 2019-05-14 DIAGNOSIS — Z20828 Contact with and (suspected) exposure to other viral communicable diseases: Secondary | ICD-10-CM | POA: Diagnosis not present

## 2019-05-14 NOTE — Telephone Encounter (Signed)
Please schedule virtual visit

## 2019-05-15 ENCOUNTER — Ambulatory Visit (INDEPENDENT_AMBULATORY_CARE_PROVIDER_SITE_OTHER): Payer: BC Managed Care – PPO | Admitting: Primary Care

## 2019-05-15 ENCOUNTER — Telehealth: Payer: Self-pay | Admitting: Pulmonary Disease

## 2019-05-15 ENCOUNTER — Other Ambulatory Visit: Payer: Self-pay

## 2019-05-15 ENCOUNTER — Encounter: Payer: Self-pay | Admitting: Primary Care

## 2019-05-15 ENCOUNTER — Encounter: Payer: Self-pay | Admitting: Family Medicine

## 2019-05-15 DIAGNOSIS — J441 Chronic obstructive pulmonary disease with (acute) exacerbation: Secondary | ICD-10-CM

## 2019-05-15 MED ORDER — NYSTATIN 100000 UNIT/ML MT SUSP: 5.0000 mL | Freq: Four times a day (QID) | OROMUCOSAL | 0 refills | Status: DC

## 2019-05-15 MED ORDER — PREDNISONE 10 MG PO TABS: ORAL_TABLET | ORAL | 0 refills | Status: DC

## 2019-05-15 NOTE — Progress Notes (Signed)
Virtual Visit via Telephone Note  I connected with Monica Hull on 05/15/19 at  9:30 AM EST by telephone and verified that I am speaking with the correct person using two identifiers.  Location: Patient: Home Provider: Office   I discussed the limitations, risks, security and privacy concerns of performing an evaluation and management service by telephone and the availability of in person appointments. I also discussed with the patient that there may be a patient responsible charge related to this service. The patient expressed understanding and agreed to proceed.   History of Present Illness: 58 year old female, current every day smoker. PMH significant for COPD, sleep apnea, allergic rhinitis, GERD, hypertension, carotid stenosis, diet controlled diabetes mellitus, hyperlipidemia. Patient of Dr. Patsey Berthold, last seen on 03/25/19. Started on Spiriva and given spacer with his Symbicort.   05/15/2019 Patient contacted today for acute televisit. Patient reports increase in dry cough and wheeze over the last several days. Breathing is baseline. She thinks her symptoms are due to the colder weather. States that her brother has covid and is in the hospital with pneumonia. Her daughter tested positive for covid but has no symptoms. She has had indirect contact with both family members but states that she tested negative herself. Denies fever, change in smell/taste, chest tightness, N/V/D.   Observations/Objective:  - No significant shortness of breath, wheezing or cough noted during phone call  Assessment and Plan:  COPD exacerbation - Increased dry cough and wheezing x 3-4 days d/t weather change - No obvious signs of bacterial infection, holding off on abx at this time - RX prednisone taper (40mg  x 3 days; 30mg  x 3 days; 20mg  x 3 days; 10mg  x 3 days) - Continue Symbicort 160 with spacer and Spiriva respimat 2.5 - Use albuterol nebulizer scheduled q8 hours for shortness of  breath/wheezing  - Monitor for fever, purulent sputum or worsening respiratory symptoms  Follow Up Instructions:   - Call office if symptoms do not improve or worsen   I discussed the assessment and treatment plan with the patient. The patient was provided an opportunity to ask questions and all were answered. The patient agreed with the plan and demonstrated an understanding of the instructions.   The patient was advised to call back or seek an in-person evaluation if the symptoms worsen or if the condition fails to improve as anticipated.  I provided 18 minutes of non-face-to-face time during this encounter.   Martyn Ehrich, NP

## 2019-05-15 NOTE — Patient Instructions (Signed)
Nice speaking with you Monica Hull, treating you for COPD exacerbation with prednisone taper. No obvious signs of bacterial infection or pneumonia, holding off on antibiotic at this time.   - RX prednisone taper (40mg  x 3 days; 30mg  x 3 days; 20mg  x 3 days; 10mg  x 3 days) - Continue Symbicort 160 with spacer and Spiriva respimat 2.5 - Monitor for fever, purulent sputum or worsening respiratory symptoms - Notify office if symptoms do not improve

## 2019-05-15 NOTE — Telephone Encounter (Signed)
Called and spoke to Monica Hull. Monica Hull states she developed a dry cough and wheezing 2-3 days ago. Sob is baseline.  No f/c/s. Using symbicort BID, spiriva and albuterol daily.  Not using any OTC medications.  Monica Hull has been scheduled for phone visit with beth today at 9:30a.

## 2019-05-18 ENCOUNTER — Ambulatory Visit: Payer: Self-pay | Admitting: Pulmonary Disease

## 2019-05-20 ENCOUNTER — Encounter: Payer: Self-pay | Admitting: Family Medicine

## 2019-05-20 ENCOUNTER — Other Ambulatory Visit: Payer: Self-pay

## 2019-05-20 ENCOUNTER — Ambulatory Visit (INDEPENDENT_AMBULATORY_CARE_PROVIDER_SITE_OTHER): Payer: BC Managed Care – PPO | Admitting: Family Medicine

## 2019-05-20 DIAGNOSIS — J441 Chronic obstructive pulmonary disease with (acute) exacerbation: Secondary | ICD-10-CM

## 2019-05-20 DIAGNOSIS — Z20828 Contact with and (suspected) exposure to other viral communicable diseases: Secondary | ICD-10-CM | POA: Diagnosis not present

## 2019-05-20 MED ORDER — AMOXICILLIN-POT CLAVULANATE 875-125 MG PO TABS: 1.0000 | ORAL_TABLET | Freq: Two times a day (BID) | ORAL | 0 refills | Status: DC

## 2019-05-20 NOTE — Assessment & Plan Note (Signed)
Doing better on the prednisone from pulm. To have COVID test again today. Concern for pneumonia with pain. Augmentin sent if not getting better. Cont Monitoring

## 2019-05-20 NOTE — Progress Notes (Signed)
There were no vitals taken for this visit.   Subjective:    Patient ID: Monica Hull, female    DOB: 06-08-1961, 58 y.o.   MRN: [id number]  HPI: Monica Hull is a 57 y.o. female  Chief Complaint  Patient presents with  . Other    Patient states that she was having some pain in her right lung, but it is getting since starting prednisone    UPPER RESPIRATORY TRACT INFECTION- was supposed to have a pulmonology appointment, but canceled as she's on isolation Exposures to [place]. Getting tested again this PM. Has tested negative 2x Duration: a week Worst symptom: side pain Fever: no Cough: yes Shortness of breath: yes Wheezing: yes Chest pain: yes Chest tightness: yes Chest congestion: yes Nasal congestion: yes Runny nose: yes Post nasal drip: yes Sneezing: no Sore throat: yes Swollen glands: no Sinus pressure: no Headache: no Face pain: no Toothache: no Ear pain: no  Ear pressure: no  Eyes red/itching:no Eye drainage/crusting: no  Vomiting: no Rash: no Fatigue: yes Sick contacts: yes Strep contacts: no  Context: better Recurrent sinusitis: no Relief with OTC cold/cough medications: no  Treatments attempted: prednisone   Relevant past medical, surgical, family and social history reviewed and updated as indicated. Interim medical history since our last visit reviewed. Allergies and medications reviewed and updated.  Review of Systems  Constitutional: Positive for fatigue. Negative for activity change, appetite change, chills, diaphoresis, fever and unexpected weight change.  HENT: Positive for congestion, postnasal drip, rhinorrhea and sinus pain. Negative for dental problem, drooling, ear discharge, ear pain, facial swelling, hearing loss, mouth sores, nosebleeds, sinus pressure, sneezing, sore throat, tinnitus, trouble swallowing and voice change.   Respiratory: Positive for cough, shortness of breath and wheezing. Negative for apnea, choking,  chest tightness and stridor.   Cardiovascular: Negative.   Gastrointestinal: Negative.   Neurological: Negative.   Psychiatric/Behavioral: Negative.     Per HPI unless specifically indicated above     Objective:    There were no vitals taken for this visit.  Wt Readings from Last 3 Encounters:  04/07/19 188 lb (85.3 kg)  03/25/19 188 lb 12.8 oz (85.6 kg)  03/23/19 187 lb (84.8 kg)    Physical Exam Vitals signs and nursing note reviewed.  Constitutional:      General: She is not in acute distress.    Appearance: Normal appearance. She is not ill-appearing, toxic-appearing or diaphoretic.  HENT:     Head: Normocephalic and atraumatic.     Right Ear: External ear normal.     Left Ear: External ear normal.     Nose: Nose normal.     Mouth/Throat:     Mouth: Mucous membranes are moist.     Pharynx: Oropharynx is clear.  Eyes:     General: No scleral icterus.       Right eye: No discharge.        Left eye: No discharge.     Conjunctiva/sclera: Conjunctivae normal.     Pupils: Pupils are equal, round, and reactive to light.  Neck:     Musculoskeletal: Normal range of motion.  Pulmonary:     Effort: Pulmonary effort is normal. No respiratory distress.     Comments: Speaking in full sentences Musculoskeletal: Normal range of motion.  Skin:    Coloration: Skin is not jaundiced or pale.     Findings: No bruising, erythema, lesion or rash.  Neurological:     Mental Status: She is alert and  oriented to person, place, and time. Mental status is at baseline.  Psychiatric:        Mood and Affect: Mood normal.        Behavior: Behavior normal.        Thought Content: Thought content normal.        Judgment: Judgment normal.     Results for orders placed or performed in visit on 02/27/19  TSH  Result Value Ref Range   TSH 3.350 0.450 - 4.500 uIU/mL  Microalbumin, Urine Waived  Result Value Ref Range   Microalb, Ur Waived 30 (H) 0 - 19 mg/L   Creatinine, Urine Waived 200  10 - 300 mg/dL   Microalb/Creat Ratio <30 <30 mg/g  Lipid Panel w/o Chol/HDL Ratio  Result Value Ref Range   Cholesterol, Total 179 100 - 199 mg/dL   Triglycerides 093 (H) 0 - 149 mg/dL   HDL 53 >11 mg/dL   VLDL Cholesterol Cal 31 5 - 40 mg/dL   LDL Chol Calc (NIH) 95 0 - 99 mg/dL  Comprehensive metabolic panel  Result Value Ref Range   Glucose 158 (H) 65 - 99 mg/dL   BUN 14 6 - 24 mg/dL   Creatinine, Ser 2.16 0.57 - 1.00 mg/dL   GFR calc non Af Amer 94 >59 mL/min/1.73   GFR calc Af Amer 108 >59 mL/min/1.73   BUN/Creatinine Ratio 20 9 - 23   Sodium 142 134 - 144 mmol/L   Potassium 4.2 3.5 - 5.2 mmol/L   Chloride 103 96 - 106 mmol/L   CO2 23 20 - 29 mmol/L   Calcium 8.8 8.7 - 10.2 mg/dL   Total Protein 6.2 6.0 - 8.5 g/dL   Albumin 3.9 3.8 - 4.9 g/dL   Globulin, Total 2.3 1.5 - 4.5 g/dL   Albumin/Globulin Ratio 1.7 1.2 - 2.2   Bilirubin Total 0.5 0.0 - 1.2 mg/dL   Alkaline Phosphatase 110 39 - 117 IU/L   AST 41 (H) 0 - 40 IU/L   ALT 61 (H) 0 - 32 IU/L  CBC with Differential/Platelet  Result Value Ref Range   WBC 10.9 (H) 3.4 - 10.8 x10E3/uL   RBC 4.98 3.77 - 5.28 x10E6/uL   Hemoglobin 15.2 11.1 - 15.9 g/dL   Hematocrit 24.4 69.5 - 46.6 %   MCV 91 79 - 97 fL   MCH 30.5 26.6 - 33.0 pg   MCHC 33.7 31.5 - 35.7 g/dL   RDW 07.2 25.7 - 50.5 %   Platelets 248 150 - 450 x10E3/uL   Neutrophils 49 Not Estab. %   Lymphs 41 Not Estab. %   Monocytes 6 Not Estab. %   Eos 2 Not Estab. %   Basos 1 Not Estab. %   Neutrophils Absolute 5.4 1.4 - 7.0 x10E3/uL   Lymphocytes Absolute 4.4 (H) 0.7 - 3.1 x10E3/uL   Monocytes Absolute 0.6 0.1 - 0.9 x10E3/uL   EOS (ABSOLUTE) 0.3 0.0 - 0.4 x10E3/uL   Basophils Absolute 0.1 0.0 - 0.2 x10E3/uL   Immature Granulocytes 1 Not Estab. %   Immature Grans (Abs) 0.1 0.0 - 0.1 x10E3/uL  Bayer DCA Hb A1c Waived  Result Value Ref Range   HB A1C (BAYER DCA - WAIVED) 7.4 (H) <7.0 %      Assessment & Plan:   Problem List Items Addressed This Visit       Respiratory   COPD exacerbation (HCC) - Primary    Doing better on the prednisone from pulm. To have COVID test  again today. Concern for pneumonia with pain. Augmentin sent if not getting better. Cont Monitoring          Follow up plan: Return if symptoms worsen or fail to improve.   . This visit was completed via D[employer] due to the restrictions of the COVID-19 pandemic. All issues as above were discussed and addressed. Physical exam was done as above through visual confirmation on Doximity. If it was felt that the patient should be evaluated in the office, they were directed there. The patient verbally consented to this visit. . Location of the patient: home . Location of the provider: home . Those involved with this call:  . Provider: Olevia PerchesMegan Johnson, DO . CMA: Tiffany Reel, CMA . Front Desk/Registration: Adela Portshristan Williamson  . Time spent on call: 15 minutes with patient face to face via video conference. More than 50% of this time was spent in counseling and coordination of care. 23 minutes total spent in review of patient's record and preparation of their chart.

## 2019-05-26 ENCOUNTER — Encounter: Payer: Self-pay | Admitting: Family Medicine

## 2019-05-30 DIAGNOSIS — Z20828 Contact with and (suspected) exposure to other viral communicable diseases: Secondary | ICD-10-CM | POA: Diagnosis not present

## 2019-06-09 ENCOUNTER — Telehealth: Payer: Self-pay | Admitting: Pulmonary Disease

## 2019-06-09 ENCOUNTER — Ambulatory Visit: Payer: BC Managed Care – PPO | Admitting: Pulmonary Disease

## 2019-06-09 NOTE — Telephone Encounter (Signed)
Spoke to pt, who stated that her grandchildren's step mom tested positive for covid today.  Pt stated that she watched her grandchildren daily. Pt is questioning her chances of catching this virus, as she wears her mask when watching the children.  I have made pt aware that she could still catch this virus even with wearing a mask, however the mask does help decrease the chance some.  Pt stated that she would be scheduling an appt to get tested.  Nothing further is needed.

## 2019-06-11 DIAGNOSIS — Z20828 Contact with and (suspected) exposure to other viral communicable diseases: Secondary | ICD-10-CM | POA: Diagnosis not present

## 2019-06-17 DIAGNOSIS — Z20828 Contact with and (suspected) exposure to other viral communicable diseases: Secondary | ICD-10-CM | POA: Diagnosis not present

## 2019-07-01 ENCOUNTER — Other Ambulatory Visit: Payer: Self-pay

## 2019-07-02 ENCOUNTER — Ambulatory Visit: Payer: Self-pay

## 2019-07-07 ENCOUNTER — Ambulatory Visit: Payer: Self-pay | Admitting: Pulmonary Disease

## 2019-07-07 DIAGNOSIS — J449 Chronic obstructive pulmonary disease, unspecified: Secondary | ICD-10-CM

## 2019-07-07 DIAGNOSIS — F17210 Nicotine dependence, cigarettes, uncomplicated: Secondary | ICD-10-CM

## 2019-07-07 MED ORDER — PREDNISONE 10 MG (21) PO TBPK: ORAL_TABLET | ORAL | 0 refills | Status: DC

## 2019-07-07 NOTE — Patient Instructions (Signed)
1.  Follow-up in 3 weeks  2.  We sent a steroid taper.  3.  Continue to work on discontinuation of smoking  4.  I will discuss with your doctor changing your blood pressure medication.

## 2019-07-07 NOTE — Progress Notes (Signed)
 Assessment & Plan:  1. 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 Tobacco dependence due to cigarettes   Patient Instructions  1.  Follow-up in 3 weeks  2.  We sent a steroid taper.  3.  Continue to work on discontinuation of smoking  4.  I will discuss with your doctor changing your blood pressure medication.    Consider discontinuing lisinopril  Tablet pack  Please note: late entry documentation due to logistical difficulties during COVID-19 pandemic. This note is filed for information purposes only, and is not intended to be used for billing, nor does it represent the full scope/nature of the visit in question. Please see any associated scanned media linked to date of encounter for additional pertinent information.  Subjective:    HPI: Monica Hull is a 59 y.o. female presenting to the pulmonology clinic on 07/07/2019 with report of: No chief complaint on file.  Virtual Visit Via Video or Telephone Note:   This visit type was conducted due to national recommendations for restrictions regarding the COVID-19 pandemic .  This format is felt to be most appropriate for this patient at this time.  All issues noted in this document were discussed and addressed.  No physical exam was performed (except for noted visual exam findings with Video Visits).    I connected with Monica Hull by  telephone at 3:03 PM and verified that I was speaking with the correct person using two identifiers. Location patient: home Location provider: [facility] Persons participating in the virtual visit: patient, physician   I discussed the limitations, risks, security and privacy concerns of performing an evaluation and management service by video and the availability of in person appointments. The patient expressed understanding and agreed to proceed.   Outpatient Encounter Medications as of 07/07/2019  Medication Sig Note    Spacer/Aero-Holding Chambers (AEROCHAMBER MV) inhaler Use as instructed 03/27/2023: Needs refill   [DISCONTINUED] albuterol  (PROVENTIL ) (2.5 MG/3ML) 0.083% nebulizer solution Take 3 mLs (2.5 mg total) by nebulization every 6 (six) hours as needed for wheezing or shortness of breath.    [DISCONTINUED] albuterol  (VENTOLIN  HFA) 108 (90 Base) MCG/ACT inhaler Inhale 2 puffs into the lungs every 6 (six) hours as needed for wheezing or shortness of breath.    [DISCONTINUED] amoxicillin -clavulanate (AUGMENTIN ) 875-125 MG tablet Take 1 tablet by mouth 2 (two) times daily.    [DISCONTINUED] atorvastatin  (LIPITOR) 20 MG tablet Take 1 tablet (20 mg total) by mouth daily.    [DISCONTINUED] budesonide -formoterol  (SYMBICORT ) 160-4.5 MCG/ACT inhaler Inhale 2 puffs into the lungs 2 (two) times daily.    [DISCONTINUED] cetirizine  (ZYRTEC ) 10 MG tablet Take 1 tablet (10 mg total) by mouth daily.    [DISCONTINUED] diclofenac  (VOLTAREN ) 50 MG EC tablet Take 1 tablet (50 mg total) by mouth 4 (four) times daily.    [DISCONTINUED] fesoterodine  (TOVIAZ ) 8 MG TB24 tablet Take 1 tablet (8 mg total) by mouth daily.    [DISCONTINUED] fluticasone  (FLONASE ) 50 MCG/ACT nasal spray Place 2 sprays into both nostrils daily.    [DISCONTINUED] lisinopril  (ZESTRIL ) 30 MG tablet Take 1 tablet (30 mg total) by mouth daily.    [DISCONTINUED] nystatin  (MYCOSTATIN ) 100000 UNIT/ML suspension Take 5 mLs (500,000 Units total) by mouth 4 (four) times daily. (Patient not taking: Reported on 10/02/2021)    [DISCONTINUED] pantoprazole  (PROTONIX ) 40 MG tablet Take 1 tablet (40 mg total) by mouth every morning.    [DISCONTINUED] predniSONE  (DELTASONE ) 10 MG tablet Take 4 tabs  po daily x 3 days; then 3 tabs daily x3 days; then 2 tabs daily x3 days; then 1 tab daily x 3 days; then stop    [DISCONTINUED] predniSONE  (STERAPRED UNI-PAK 21 TAB) 10 MG (21) TBPK tablet Take as directed in package    [DISCONTINUED] Tiotropium Bromide Monohydrate  (SPIRIVA   RESPIMAT) 2.5 MCG/ACT AERS Inhale 2 puffs into the lungs daily.    [DISCONTINUED] tiZANidine (ZANAFLEX) 4 MG capsule Take 4 mg by mouth 3 (three) times daily as needed for muscle spasms. Reported on 07/11/2015    [DISCONTINUED] triamcinolone  cream (KENALOG ) 0.1 % Apply 1 application topically 2 (two) times daily.    No facility-administered encounter medications on file as of 07/07/2019.      Objective:   There were no vitals filed for this visit.   Physical exam documentation is limited by delayed entry of information.

## 2019-07-11 DIAGNOSIS — Z20828 Contact with and (suspected) exposure to other viral communicable diseases: Secondary | ICD-10-CM | POA: Diagnosis not present

## 2019-07-14 ENCOUNTER — Ambulatory Visit: Payer: BC Managed Care – PPO | Attending: Internal Medicine

## 2019-07-14 ENCOUNTER — Encounter: Payer: Self-pay | Admitting: Family Medicine

## 2019-07-14 ENCOUNTER — Ambulatory Visit (INDEPENDENT_AMBULATORY_CARE_PROVIDER_SITE_OTHER): Payer: Self-pay | Admitting: Family Medicine

## 2019-07-14 ENCOUNTER — Other Ambulatory Visit: Payer: Self-pay

## 2019-07-14 VITALS — Temp 98.3°F | Ht 62.5 in | Wt 185.0 lb

## 2019-07-14 DIAGNOSIS — Z20822 Contact with and (suspected) exposure to covid-19: Secondary | ICD-10-CM

## 2019-07-14 DIAGNOSIS — J069 Acute upper respiratory infection, unspecified: Secondary | ICD-10-CM

## 2019-07-14 DIAGNOSIS — J441 Chronic obstructive pulmonary disease with (acute) exacerbation: Secondary | ICD-10-CM

## 2019-07-14 MED ORDER — AZITHROMYCIN 250 MG PO TABS: ORAL_TABLET | ORAL | 0 refills | Status: DC

## 2019-07-14 MED ORDER — PREDNISONE 10 MG PO TABS: ORAL_TABLET | ORAL | 0 refills | Status: DC

## 2019-07-14 NOTE — Progress Notes (Signed)
Temp 98.3 F (36.8 C) (Temporal)   Ht 5' 2.5" (1.588 m)   Wt 185 lb (83.9 kg)   BMI 33.30 kg/m    Subjective:    Patient ID: Monica Hull, female    DOB: 08-26-1960, 59 y.o.   MRN: [id number]  HPI: Monica Hull is a 59 y.o. female  Chief Complaint  Patient presents with  . Headache  . Nasal Congestion  . Ear Problem    right ear pressure. symptoms started 3 days ago    . This visit was completed via telephone due to the restrictions of the COVID-19 pandemic. All issues as above were discussed and addressed. Physical exam was done as above through visual confirmation on telephone. If it was felt that the patient should be evaluated in the office, they were directed there. The patient verbally consented to this visit. . Location of the patient: home . Location of the provider: home . Those involved with this call:  . Provider: Merrie Roof, PA-C . CMA: Lesle Chris, [place] . Front Desk/Registration: Jill Side  . Time spent on call: 15 minutes on the phone discussing health concerns. 5 minutes total spent in review of patient's record and preparation of their chart. I verified patient identity using two factors (patient name and date of birth). Patient consents verbally to being seen via telemedicine visit today.   About 3 days of sinus pain and pressure, nasal congestion, sinus headaches, right ear pain and pressure. Husband positive for COVID but she's tested negative over the weekend and retested this morning. Denies fever, chills, CP, body aches, sore throat. Does have a cough and some SOB but stays that way between her COPD and SOB. Pulmonologist gave her prednisone for this but just finished those this morning. Has been consistent with allergy and inhaler regimen.   Relevant past medical, surgical, family and social history reviewed and updated as indicated. Interim medical history since our last visit reviewed. Allergies and medications reviewed and  updated.  Review of Systems  Per HPI unless specifically indicated above     Objective:    Temp 98.3 F (36.8 C) (Temporal)   Ht 5' 2.5" (1.588 m)   Wt 185 lb (83.9 kg)   BMI 33.30 kg/m   Wt Readings from Last 3 Encounters:  07/14/19 185 lb (83.9 kg)  04/07/19 188 lb (85.3 kg)  03/25/19 188 lb 12.8 oz (85.6 kg)    Physical Exam  Unable to perform PE due to technical difficulties with video technology  Results for orders placed or performed in visit on 02/27/19  TSH  Result Value Ref Range   TSH 3.350 0.450 - 4.500 uIU/mL  Microalbumin, Urine Waived  Result Value Ref Range   Microalb, Ur Waived 30 (H) 0 - 19 mg/L   Creatinine, Urine Waived 200 10 - 300 mg/dL   Microalb/Creat Ratio <30 <30 mg/g  Lipid Panel w/o Chol/HDL Ratio  Result Value Ref Range   Cholesterol, Total 179 100 - 199 mg/dL   Triglycerides 179 (H) 0 - 149 mg/dL   HDL 53 >39 mg/dL   VLDL Cholesterol Cal 31 5 - 40 mg/dL   LDL Chol Calc (NIH) 95 0 - 99 mg/dL  Comprehensive metabolic panel  Result Value Ref Range   Glucose 158 (H) 65 - 99 mg/dL   BUN 14 6 - 24 mg/dL   Creatinine, Ser 0.71 0.57 - 1.00 mg/dL   GFR calc non Af Amer 94 >59 mL/min/1.73   GFR calc  Af Amer 108 >59 mL/min/1.73   BUN/Creatinine Ratio 20 9 - 23   Sodium 142 134 - 144 mmol/L   Potassium 4.2 3.5 - 5.2 mmol/L   Chloride 103 96 - 106 mmol/L   CO2 23 20 - 29 mmol/L   Calcium 8.8 8.7 - 10.2 mg/dL   Total Protein 6.2 6.0 - 8.5 g/dL   Albumin 3.9 3.8 - 4.9 g/dL   Globulin, Total 2.3 1.5 - 4.5 g/dL   Albumin/Globulin Ratio 1.7 1.2 - 2.2   Bilirubin Total 0.5 0.0 - 1.2 mg/dL   Alkaline Phosphatase 110 39 - 117 IU/L   AST 41 (H) 0 - 40 IU/L   ALT 61 (H) 0 - 32 IU/L  CBC with Differential/Platelet  Result Value Ref Range   WBC 10.9 (H) 3.4 - 10.8 x10E3/uL   RBC 4.98 3.77 - 5.28 x10E6/uL   Hemoglobin 15.2 11.1 - 15.9 g/dL   Hematocrit 79.8 92.1 - 46.6 %   MCV 91 79 - 97 fL   MCH 30.5 26.6 - 33.0 pg   MCHC 33.7 31.5 - 35.7 g/dL     RDW 19.4 17.4 - 08.1 %   Platelets 248 150 - 450 x10E3/uL   Neutrophils 49 Not Estab. %   Lymphs 41 Not Estab. %   Monocytes 6 Not Estab. %   Eos 2 Not Estab. %   Basos 1 Not Estab. %   Neutrophils Absolute 5.4 1.4 - 7.0 x10E3/uL   Lymphocytes Absolute 4.4 (H) 0.7 - 3.1 x10E3/uL   Monocytes Absolute 0.6 0.1 - 0.9 x10E3/uL   EOS (ABSOLUTE) 0.3 0.0 - 0.4 x10E3/uL   Basophils Absolute 0.1 0.0 - 0.2 x10E3/uL   Immature Granulocytes 1 Not Estab. %   Immature Grans (Abs) 0.1 0.0 - 0.1 x10E3/uL  Bayer DCA Hb A1c Waived  Result Value Ref Range   HB A1C (BAYER DCA - WAIVED) 7.4 (H) <7.0 %      Assessment & Plan:   Problem List Items Addressed This Visit      Respiratory   COPD (chronic obstructive pulmonary disease) (HCC) - Primary    Exacerbation from acute illness. Tx with prednisone, zpak, continued inhaler regimen. F/u if worsening or not improving      Relevant Medications   azithromycin (ZITHROMAX) 250 MG tablet   predniSONE (DELTASONE) 10 MG tablet   Recurrent URI (upper respiratory infection)    Await repeat COVID testing, continue quarantine. Zpak, prednisone sent. Supportive care and return testing reviewed      Relevant Medications   azithromycin (ZITHROMAX) 250 MG tablet       Follow up plan: Return if symptoms worsen or fail to improve.

## 2019-07-14 NOTE — Assessment & Plan Note (Signed)
Await repeat COVID testing, continue quarantine. Zpak, prednisone sent. Supportive care and return testing reviewed

## 2019-07-14 NOTE — Assessment & Plan Note (Addendum)
Exacerbation from acute illness. Tx with prednisone, zpak, continued inhaler regimen. F/u if worsening or not improving

## 2019-07-15 ENCOUNTER — Other Ambulatory Visit: Payer: Self-pay | Admitting: Family Medicine

## 2019-07-15 ENCOUNTER — Telehealth: Payer: Self-pay

## 2019-07-15 LAB — NOVEL CORONAVIRUS, NAA: SARS-CoV-2, NAA: NOT DETECTED

## 2019-07-15 MED ORDER — BUDESONIDE-FORMOTEROL FUMARATE 160-4.5 MCG/ACT IN AERO: 2.0000 | INHALATION_SPRAY | Freq: Two times a day (BID) | RESPIRATORY_TRACT | 3 refills | Status: DC

## 2019-07-15 NOTE — Telephone Encounter (Signed)
Fax from pharmacy. Drug change request. Budesonide/form 160/4.80mcg not covered by patient's insurance. Requesting change to Fluticasone/Salmeterol, Wixela Inhub, Advair HFA, Breo, Dulera.   Change to one of the medications or do you want me to attempt PA?

## 2019-07-16 NOTE — Telephone Encounter (Signed)
I think she's had bad reactions to some of these previously- can we check with patient (she should know) and if she has, do a PA- if she doesn't think so, I'll change her to one of them.

## 2019-07-17 NOTE — Telephone Encounter (Signed)
Perfect. Thank you!

## 2019-07-17 NOTE — Telephone Encounter (Signed)
Spoke with patient.  Pharmacy called her yesterday and she can get her medicine for $40 with insurance and she stated that's what she is going to do. She picked it up yesterday.  Patient stated she got a rash and thrush from the Advair. I added Advair to her allergy list. Routing to provider as FYI.

## 2019-07-30 ENCOUNTER — Ambulatory Visit: Payer: BC Managed Care – PPO | Attending: Internal Medicine

## 2019-07-30 ENCOUNTER — Other Ambulatory Visit: Payer: BC Managed Care – PPO

## 2019-07-31 ENCOUNTER — Other Ambulatory Visit: Payer: Self-pay | Admitting: Family Medicine

## 2019-08-01 DIAGNOSIS — Z20828 Contact with and (suspected) exposure to other viral communicable diseases: Secondary | ICD-10-CM | POA: Diagnosis not present

## 2019-08-04 ENCOUNTER — Ambulatory Visit: Payer: BC Managed Care – PPO | Admitting: Pulmonary Disease

## 2019-08-04 DIAGNOSIS — F17210 Nicotine dependence, cigarettes, uncomplicated: Secondary | ICD-10-CM

## 2019-08-04 DIAGNOSIS — J449 Chronic obstructive pulmonary disease, unspecified: Secondary | ICD-10-CM

## 2019-08-04 NOTE — Progress Notes (Signed)
 Assessment & Plan:  1. 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 Tobacco dependence due to cigarettes   Patient Instructions  We will see you in follow-up in 4 to 6 weeks time that should be an in person visit.  We will need to listen to your lungs carefully at that time.  Please note: late entry documentation due to logistical difficulties during COVID-19 pandemic. This note is filed for information purposes only, and is not intended to be used for billing, nor does it represent the full scope/nature of the visit in question. Please see any associated scanned media linked to date of encounter for additional pertinent information.  Subjective:    HPI: Monica Hull is a 59 y.o. female presenting to the pulmonology clinic on 08/04/2019 with report of: No chief complaint on file.   Virtual Visit Via Video or Telephone Note:   This visit type was conducted due to national recommendations for restrictions regarding the COVID-19 pandemic .  This format is felt to be most appropriate for this patient at this time.  All issues noted in this document were discussed and addressed.  No physical exam was performed (except for noted visual exam findings with Video Visits).    I connected with Monica Hull by telephone at 10:34 AM and verified that I was speaking with the correct person using two identifiers. Location patient: home Location provider: [facility] Persons participating in the virtual visit: patient, physician   I discussed the limitations, risks, security and privacy concerns of performing an evaluation and management service by video and the availability of in person appointments. The patient expressed understanding and agreed to proceed.  Outpatient Encounter Medications as of 08/04/2019  Medication Sig Note   Spacer/Aero-Holding Chambers (AEROCHAMBER MV) inhaler Use as instructed 03/27/2023: Needs refill   [DISCONTINUED]  albuterol  (PROVENTIL ) (2.5 MG/3ML) 0.083% nebulizer solution Take 3 mLs (2.5 mg total) by nebulization every 6 (six) hours as needed for wheezing or shortness of breath.    [DISCONTINUED] albuterol  (VENTOLIN  HFA) 108 (90 Base) MCG/ACT inhaler Inhale 2 puffs into the lungs every 6 (six) hours as needed for wheezing or shortness of breath.    [DISCONTINUED] amoxicillin -clavulanate (AUGMENTIN ) 875-125 MG tablet Take 1 tablet by mouth 2 (two) times daily.    [DISCONTINUED] atorvastatin  (LIPITOR) 20 MG tablet Take 1 tablet (20 mg total) by mouth daily.    [DISCONTINUED] azithromycin  (ZITHROMAX ) 250 MG tablet Take 2 tabs day one, then 1 tab daily until complete    [DISCONTINUED] budesonide -formoterol  (SYMBICORT ) 160-4.5 MCG/ACT inhaler Inhale 2 puffs into the lungs 2 (two) times daily.    [DISCONTINUED] budesonide -formoterol  (SYMBICORT ) 160-4.5 MCG/ACT inhaler Inhale 2 puffs into the lungs 2 (two) times daily.    [DISCONTINUED] cetirizine  (ZYRTEC ) 10 MG tablet Take 1 tablet (10 mg total) by mouth daily.    [DISCONTINUED] diclofenac  (VOLTAREN ) 50 MG EC tablet Take 1 tablet (50 mg total) by mouth 4 (four) times daily.    [DISCONTINUED] fesoterodine  (TOVIAZ ) 8 MG TB24 tablet Take 1 tablet (8 mg total) by mouth daily.    [DISCONTINUED] fluticasone  (FLONASE ) 50 MCG/ACT nasal spray Place 2 sprays into both nostrils daily.    [DISCONTINUED] lisinopril  (ZESTRIL ) 30 MG tablet Take 1 tablet (30 mg total) by mouth daily.    [DISCONTINUED] nystatin  (MYCOSTATIN ) 100000 UNIT/ML suspension Take 5 mLs (500,000 Units total) by mouth 4 (four) times daily. (Patient not taking: Reported on 10/02/2021)    [DISCONTINUED] pantoprazole  (PROTONIX ) 40 MG tablet Take  1 tablet (40 mg total) by mouth every morning.    [DISCONTINUED] pantoprazole  (PROTONIX ) 40 MG tablet TAKE 1 TABLET BY MOUTH EVERY MORNING    [DISCONTINUED] predniSONE  (DELTASONE ) 10 MG tablet Take 6 tabs day one, 5 tabs day two, 4 tabs day three, etc    [DISCONTINUED]  predniSONE  (STERAPRED UNI-PAK 21 TAB) 10 MG (21) TBPK tablet Take as directed in package    [DISCONTINUED] Tiotropium Bromide Monohydrate  (SPIRIVA  RESPIMAT) 2.5 MCG/ACT AERS Inhale 2 puffs into the lungs daily.    [DISCONTINUED] tiZANidine (ZANAFLEX) 4 MG capsule Take 4 mg by mouth 3 (three) times daily as needed for muscle spasms. Reported on 07/11/2015    [DISCONTINUED] triamcinolone  cream (KENALOG ) 0.1 % Apply 1 application topically 2 (two) times daily.    No facility-administered encounter medications on file as of 08/04/2019.      Objective:   There were no vitals filed for this visit.   Physical exam documentation is limited by delayed entry of information.

## 2019-08-04 NOTE — Patient Instructions (Signed)
We will see you in follow-up in 4 to 6 weeks time that should be an in person visit.  We will need to listen to your lungs carefully at that time.

## 2019-08-12 ENCOUNTER — Other Ambulatory Visit: Payer: Self-pay

## 2019-08-12 MED ORDER — SPIRIVA RESPIMAT 2.5 MCG/ACT IN AERS: 2.0000 | INHALATION_SPRAY | Freq: Every day | RESPIRATORY_TRACT | 3 refills | Status: DC

## 2019-08-19 ENCOUNTER — Other Ambulatory Visit: Payer: Self-pay

## 2019-08-20 ENCOUNTER — Other Ambulatory Visit: Payer: Self-pay

## 2019-08-20 MED ORDER — SPIRIVA RESPIMAT 2.5 MCG/ACT IN AERS: 2.0000 | INHALATION_SPRAY | Freq: Every day | RESPIRATORY_TRACT | 3 refills | Status: DC

## 2019-09-10 ENCOUNTER — Ambulatory Visit: Payer: BC Managed Care – PPO | Attending: Internal Medicine

## 2019-09-10 DIAGNOSIS — Z23 Encounter for immunization: Secondary | ICD-10-CM

## 2019-09-10 NOTE — Progress Notes (Signed)
   Covid-19 Vaccination Clinic  Name:  Jennifr Gaeta    MRN: [id number] DOB: 1961-01-01  09/10/2019  Ms. Gracia was observed post Covid-19 immunization for 30 minutes based on pre-vaccination screening without incident. She was provided with Vaccine Information Sheet and instruction to access the V-Safe system.   Ms. Lampron was instructed to call 911 with any severe reactions post vaccine: Marland Kitchen Difficulty breathing  . Swelling of face and throat  . A fast heartbeat  . A bad rash all over body  . Dizziness and weakness   Immunizations Administered    Name Date Dose VIS Date Route   Pfizer COVID-19 Vaccine 09/10/2019 10:57 AM 0.3 mL 06/05/2019 Intramuscular   Manufacturer: [employer], [employer]   Lot: [id number]   NDC: [address]-2

## 2019-09-11 ENCOUNTER — Ambulatory Visit: Payer: BC Managed Care – PPO | Admitting: Pulmonary Disease

## 2019-09-11 ENCOUNTER — Other Ambulatory Visit: Payer: Self-pay

## 2019-09-11 ENCOUNTER — Encounter: Payer: Self-pay | Admitting: Pulmonary Disease

## 2019-09-11 VITALS — BP 130/88 | HR 87 | Temp 98.2°F | Ht 62.5 in | Wt 184.6 lb

## 2019-09-11 DIAGNOSIS — J418 Mixed simple and mucopurulent chronic bronchitis: Secondary | ICD-10-CM

## 2019-09-11 DIAGNOSIS — F17210 Nicotine dependence, cigarettes, uncomplicated: Secondary | ICD-10-CM

## 2019-09-11 NOTE — Patient Instructions (Signed)
Continue Symbicort and Spiriva as you are doing  Continue efforts to stop smoking  See in follow-up in 6 months time call sooner should any new problems arise

## 2019-09-11 NOTE — Progress Notes (Signed)
    Assessment & Plan:  1. 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 Tobacco dependence due to cigarettes   Patient Instructions  Continue Symbicort  and Spiriva  as you are doing  Continue efforts to stop smoking  See in follow-up in 6 months time call sooner should any new problems arise  Please note: late entry documentation due to logistical difficulties during COVID-19 pandemic. This note is filed for information purposes only, and is not intended to be used for billing, nor does it represent the full scope/nature of the visit in question. Please see any associated scanned media linked to date of encounter for additional pertinent information.  Subjective:    HPI: Monica Hull is a 59 y.o. female presenting to the pulmonology clinic on 09/11/2019 with report of: Follow-up (Patient has a dry cough but otherwise feels well.)     Outpatient Encounter Medications as of 09/11/2019  Medication Sig Note   Spacer/Aero-Holding Chambers (AEROCHAMBER MV) inhaler Use as instructed 03/27/2023: Needs refill   [DISCONTINUED] albuterol  (PROVENTIL ) (2.5 MG/3ML) 0.083% nebulizer solution Take 3 mLs (2.5 mg total) by nebulization every 6 (six) hours as needed for wheezing or shortness of breath.    [DISCONTINUED] albuterol  (VENTOLIN  HFA) 108 (90 Base) MCG/ACT inhaler Inhale 2 puffs into the lungs every 6 (six) hours as needed for wheezing or shortness of breath.    [DISCONTINUED] atorvastatin  (LIPITOR) 20 MG tablet Take 1 tablet (20 mg total) by mouth daily.    [DISCONTINUED] budesonide -formoterol  (SYMBICORT ) 160-4.5 MCG/ACT inhaler Inhale 2 puffs into the lungs 2 (two) times daily.    [DISCONTINUED] cetirizine  (ZYRTEC ) 10 MG tablet Take 1 tablet (10 mg total) by mouth daily.    [DISCONTINUED] diclofenac  (VOLTAREN ) 50 MG EC tablet Take 1 tablet (50 mg total) by mouth 4 (four) times daily.    [DISCONTINUED] fesoterodine  (TOVIAZ ) 8 MG TB24 tablet Take 1 tablet (8  mg total) by mouth daily.    [DISCONTINUED] fluticasone  (FLONASE ) 50 MCG/ACT nasal spray Place 2 sprays into both nostrils daily.    [DISCONTINUED] lisinopril  (ZESTRIL ) 30 MG tablet Take 1 tablet (30 mg total) by mouth daily.    [DISCONTINUED] nystatin  (MYCOSTATIN ) 100000 UNIT/ML suspension Take 5 mLs (500,000 Units total) by mouth 4 (four) times daily. (Patient not taking: Reported on 10/02/2021)    [DISCONTINUED] pantoprazole  (PROTONIX ) 40 MG tablet TAKE 1 TABLET BY MOUTH EVERY MORNING    [DISCONTINUED] Tiotropium Bromide Monohydrate  (SPIRIVA  RESPIMAT) 2.5 MCG/ACT AERS Inhale 2 puffs into the lungs daily.    [DISCONTINUED] tiZANidine (ZANAFLEX) 4 MG capsule Take 4 mg by mouth 3 (three) times daily as needed for muscle spasms. Reported on 07/11/2015    [DISCONTINUED] triamcinolone  cream (KENALOG ) 0.1 % Apply 1 application topically 2 (two) times daily.    [DISCONTINUED] azithromycin  (ZITHROMAX ) 250 MG tablet Take 2 tabs day one, then 1 tab daily until complete    [DISCONTINUED] predniSONE  (DELTASONE ) 10 MG tablet Take 6 tabs day one, 5 tabs day two, 4 tabs day three, etc    No facility-administered encounter medications on file as of 09/11/2019.      Objective:   Vitals:   09/11/19 0835  BP: 130/88  Pulse: 87  Temp: 98.2 F (36.8 C)  Height: 5' 2.5 (1.588 m)  Weight: 184 lb 9.6 oz (83.7 kg)  SpO2: 94%  TempSrc: Temporal  BMI (Calculated): 33.2     Physical exam documentation is limited by delayed entry of information.

## 2019-10-06 ENCOUNTER — Ambulatory Visit: Payer: BC Managed Care – PPO | Attending: Internal Medicine

## 2019-10-06 DIAGNOSIS — Z23 Encounter for immunization: Secondary | ICD-10-CM

## 2019-10-06 NOTE — Progress Notes (Signed)
   Covid-19 Vaccination Clinic  Name:  Porfiria Heinrich    MRN: [id number] DOB: April 18, 1961  10/06/2019  Ms. Smedberg was observed post Covid-19 immunization for 15 minutes without incident. She was provided with Vaccine Information Sheet and instruction to access the V-Safe system.   Ms. Lindahl was instructed to call 911 with any severe reactions post vaccine: Marland Kitchen Difficulty breathing  . Swelling of face and throat  . A fast heartbeat  . A bad rash all over body  . Dizziness and weakness   Immunizations Administered    Name Date Dose VIS Date Route   Pfizer COVID-19 Vaccine 10/06/2019 10:55 AM 0.3 mL 06/05/2019 Intramuscular   Manufacturer: [employer], [employer]   Lot: [id number]   NDC: [address]-5

## 2019-10-27 ENCOUNTER — Other Ambulatory Visit: Payer: Self-pay | Admitting: Family Medicine

## 2019-10-27 ENCOUNTER — Encounter: Payer: Self-pay | Admitting: Family Medicine

## 2019-10-27 MED ORDER — FESOTERODINE FUMARATE ER 8 MG PO TB24: 8.0000 mg | ORAL_TABLET | Freq: Every day | ORAL | 3 refills | Status: DC

## 2019-11-09 ENCOUNTER — Encounter: Payer: Self-pay | Admitting: Family Medicine

## 2019-11-10 ENCOUNTER — Telehealth: Payer: Self-pay | Admitting: Pulmonary Disease

## 2019-11-10 NOTE — Telephone Encounter (Signed)
  ATC patient to get more information LMTCB   Pt states that she is having pain on her left side, doesn't know if she is going in to pleurisy and she's having a lot of wheezing. She wants to know if DG could call in an antibiotic and prednisone. Pharmacy [facility] on [address]

## 2019-11-11 NOTE — Telephone Encounter (Signed)
Called and spoke with patient   Pt states that she is having pain on her left side, doesn't know if she is going in to pleurisy and she's having a lot of wheezing and dry coughing that started end of last week/weekend. She wants to know if DG could call in an antibiotic and prednisone. Pharmacy [facility] [address]  Please advise

## 2019-11-11 NOTE — Telephone Encounter (Signed)
Called and spoke with patient and she states she has an appointment with her PCP tomorrow and would like to discuss it with them as well and then go from there. Informed patient that was fine if that is what she wanted to do and I would make note of it. If her symptoms got worse she needed to go to urgent care or emergency room. Patient expressed understanding. Nothing further needed at this time.

## 2019-11-11 NOTE — Telephone Encounter (Signed)
If she is having pain I recommend that she be evaluated at either urgent care or emergency department as she may need radiographs and further evaluation.  Dr. Reece Agar.

## 2019-11-12 ENCOUNTER — Ambulatory Visit (INDEPENDENT_AMBULATORY_CARE_PROVIDER_SITE_OTHER): Payer: BC Managed Care – PPO | Admitting: Family Medicine

## 2019-11-12 ENCOUNTER — Encounter: Payer: Self-pay | Admitting: Family Medicine

## 2019-11-12 ENCOUNTER — Ambulatory Visit
Admission: RE | Admit: 2019-11-12 | Discharge: 2019-11-12 | Disposition: A | Discharge status: Home / Self Care | Payer: BC Managed Care – PPO | Source: Ambulatory Visit | Attending: Family Medicine | Admitting: Family Medicine

## 2019-11-12 ENCOUNTER — Other Ambulatory Visit: Payer: Self-pay

## 2019-11-12 VITALS — BP 130/84 | HR 71 | Temp 97.9°F | Ht 61.61 in | Wt 183.6 lb

## 2019-11-12 DIAGNOSIS — J441 Chronic obstructive pulmonary disease with (acute) exacerbation: Secondary | ICD-10-CM | POA: Diagnosis not present

## 2019-11-12 DIAGNOSIS — I10 Essential (primary) hypertension: Secondary | ICD-10-CM | POA: Diagnosis not present

## 2019-11-12 DIAGNOSIS — R079 Chest pain, unspecified: Secondary | ICD-10-CM | POA: Diagnosis not present

## 2019-11-12 DIAGNOSIS — E782 Mixed hyperlipidemia: Secondary | ICD-10-CM

## 2019-11-12 DIAGNOSIS — I700 Atherosclerosis of aorta: Secondary | ICD-10-CM

## 2019-11-12 DIAGNOSIS — E119 Type 2 diabetes mellitus without complications: Secondary | ICD-10-CM | POA: Diagnosis not present

## 2019-11-12 DIAGNOSIS — R300 Dysuria: Secondary | ICD-10-CM | POA: Diagnosis not present

## 2019-11-12 LAB — UA/M W/RFLX CULTURE, ROUTINE
Bilirubin, UA: NEGATIVE
Glucose, UA: NEGATIVE
Ketones, UA: NEGATIVE
Leukocytes,UA: NEGATIVE
Nitrite, UA: NEGATIVE
Protein,UA: NEGATIVE
RBC, UA: NEGATIVE
Specific Gravity, UA: <1.005 — ABNORMAL LOW (ref 1.005–1.030)
Urobilinogen, Ur: 0.2 mg/dL (ref 0.2–1.0)
pH, UA: 5.5 (ref 5.0–7.5)

## 2019-11-12 LAB — BAYER DCA HB A1C WAIVED: HB A1C (BAYER DCA - WAIVED): 7.2 % — ABNORMAL HIGH (ref <7.0)

## 2019-11-12 MED ORDER — LISINOPRIL 30 MG PO TABS: 30.0000 mg | ORAL_TABLET | Freq: Every day | ORAL | 1 refills | Status: DC

## 2019-11-12 MED ORDER — FLUTICASONE PROPIONATE 50 MCG/ACT NA SUSP: 2.0000 | Freq: Every day | NASAL | 11 refills | Status: DC

## 2019-11-12 MED ORDER — ATORVASTATIN CALCIUM 20 MG PO TABS: 20.0000 mg | ORAL_TABLET | Freq: Every day | ORAL | 1 refills | Status: DC

## 2019-11-12 MED ORDER — BUDESONIDE-FORMOTEROL FUMARATE 160-4.5 MCG/ACT IN AERO: 2.0000 | INHALATION_SPRAY | Freq: Two times a day (BID) | RESPIRATORY_TRACT | 3 refills | Status: DC

## 2019-11-12 MED ORDER — PANTOPRAZOLE SODIUM 40 MG PO TBEC: 40.0000 mg | DELAYED_RELEASE_TABLET | Freq: Every morning | ORAL | 1 refills | Status: DC

## 2019-11-12 MED ORDER — ALBUTEROL SULFATE (2.5 MG/3ML) 0.083% IN NEBU: 2.5000 mg | INHALATION_SOLUTION | Freq: Four times a day (QID) | RESPIRATORY_TRACT | 1 refills | Status: DC | PRN

## 2019-11-12 MED ORDER — AZITHROMYCIN 250 MG PO TABS
ORAL_TABLET | ORAL | 0 refills | Status: DC
Start: 2019-11-12 — End: 2019-11-27

## 2019-11-12 MED ORDER — PREDNISONE 10 MG PO TABS
ORAL_TABLET | ORAL | 0 refills | Status: DC
Start: 2019-11-12 — End: 2019-11-27

## 2019-11-12 MED ORDER — METFORMIN HCL ER (MOD) 500 MG PO TB24: 500.0000 mg | ORAL_TABLET | Freq: Every day | ORAL | 3 refills | Status: DC

## 2019-11-12 MED ORDER — CETIRIZINE HCL 10 MG PO TABS: 10.0000 mg | ORAL_TABLET | Freq: Every day | ORAL | 4 refills | Status: DC

## 2019-11-12 NOTE — Progress Notes (Signed)
BP 130/84 (BP Location: Left Arm, Patient Position: Sitting, Cuff Size: Normal)   Pulse 71   Temp 97.9 F (36.6 C) (Oral)   Ht 5' 1.61" (1.565 m)   Wt 183 lb 9.6 oz (83.3 kg)   SpO2 97%   BMI 34.00 kg/m    Subjective:    Patient ID: Monica Hull, female    DOB: 22-Dec-1960, 59 y.o.   MRN: [id number]  HPI: Monica Hull is a 59 y.o. female  Chief Complaint  Patient presents with  . Hypertension  . Wheezing  . Flank Pain   UPPER RESPIRATORY TRACT INFECTION Duration: less than a month Worst symptom: cough, wheezing Fever: no Cough: yes Shortness of breath: yes Wheezing: yes Chest pain: yes- woke her up yesterday, had eaten spicy food Chest tightness: yes Chest congestion: yes Nasal congestion: no Runny nose: no Post nasal drip: no Sneezing: no Sore throat: no Swollen glands: no Sinus pressure: no Headache: no Face pain: no Toothache: no Ear pain: no  Ear pressure: no  Eyes red/itching:no Eye drainage/crusting: no  Vomiting: no Rash: no Fatigue: yes Sick contacts: no Strep contacts: no  Context: stable Recurrent sinusitis: no Relief with OTC cold/cough medications: no  Treatments attempted: inhalers  DIABETES Hypoglycemic episodes:no Polydipsia/polyuria: no Visual disturbance: no Chest pain: no Paresthesias: no Glucose Monitoring: yes  Accucheck frequency: occasionally Taking Insulin?: no Blood Pressure Monitoring: not checking Retinal Examination: Not up to Date Foot Exam: Up to Date Diabetic Education: Completed Pneumovax: Up to Date Influenza: Up to Date Aspirin: yes  HYPERTENSION / HYPERLIPIDEMIA Satisfied with current treatment? yes Duration of hypertension: chronic BP monitoring frequency: not checking BP range:  BP medication side effects: no Past BP meds: lisinopril Duration of hyperlipidemia: chronic Cholesterol medication side effects: no Cholesterol supplements: none Past cholesterol medications:  atorvastatin Medication compliance: excellent compliance Aspirin: yes Recent stressors: yes Recurrent headaches: no Visual changes: no Palpitations: no Dyspnea: yes Chest pain: no Lower extremity edema: no Dizzy/lightheaded: no  Relevant past medical, surgical, family and social history reviewed and updated as indicated. Interim medical history since our last visit reviewed. Allergies and medications reviewed and updated.  Review of Systems  Constitutional: Positive for fatigue. Negative for activity change, appetite change, chills, diaphoresis, fever and unexpected weight change.  HENT: Positive for congestion, postnasal drip and rhinorrhea. Negative for dental problem, drooling, ear discharge, ear pain, facial swelling, hearing loss, mouth sores, nosebleeds, sinus pressure, sinus pain, sneezing, sore throat, tinnitus, trouble swallowing and voice change.   Eyes: Negative.   Respiratory: Positive for cough, chest tightness, shortness of breath and wheezing. Negative for apnea, choking and stridor.   Cardiovascular: Negative.   Gastrointestinal: Negative.   Musculoskeletal: Negative.   Neurological: Negative.     Per HPI unless specifically indicated above     Objective:    BP 130/84 (BP Location: Left Arm, Patient Position: Sitting, Cuff Size: Normal)   Pulse 71   Temp 97.9 F (36.6 C) (Oral)   Ht 5' 1.61" (1.565 m)   Wt 183 lb 9.6 oz (83.3 kg)   SpO2 97%   BMI 34.00 kg/m   Wt Readings from Last 3 Encounters:  11/12/19 183 lb 9.6 oz (83.3 kg)  09/11/19 184 lb 9.6 oz (83.7 kg)  07/14/19 185 lb (83.9 kg)    Physical Exam Vitals and nursing note reviewed.  Constitutional:      General: She is not in acute distress.    Appearance: Normal appearance. She is not ill-appearing, toxic-appearing  or diaphoretic.  HENT:     Head: Normocephalic and atraumatic.     Right Ear: External ear normal.     Left Ear: External ear normal.     Nose: Nose normal.     Mouth/Throat:      Mouth: Mucous membranes are moist.     Pharynx: Oropharynx is clear.  Eyes:     General: No scleral icterus.       Right eye: No discharge.        Left eye: No discharge.     Extraocular Movements: Extraocular movements intact.     Conjunctiva/sclera: Conjunctivae normal.     Pupils: Pupils are equal, round, and reactive to light.  Cardiovascular:     Rate and Rhythm: Normal rate and regular rhythm.     Pulses: Normal pulses.     Heart sounds: Normal heart sounds. No murmur. No friction rub. No gallop.   Pulmonary:     Effort: Pulmonary effort is normal. No respiratory distress.     Breath sounds: No stridor. Wheezing and rhonchi present. No rales.  Chest:     Chest wall: No tenderness.  Musculoskeletal:        General: Normal range of motion.     Cervical back: Normal range of motion and neck supple.  Skin:    General: Skin is warm and dry.     Capillary Refill: Capillary refill takes less than 2 seconds.     Coloration: Skin is not jaundiced or pale.     Findings: No bruising, erythema, lesion or rash.  Neurological:     General: No focal deficit present.     Mental Status: She is alert and oriented to person, place, and time. Mental status is at baseline.  Psychiatric:        Mood and Affect: Mood normal.        Behavior: Behavior normal.        Thought Content: Thought content normal.        Judgment: Judgment normal.     Results for orders placed or performed in visit on 11/12/19  Bayer DCA Hb A1c Waived  Result Value Ref Range   HB A1C (BAYER DCA - WAIVED) 7.2 (H) <7.0 %  Comprehensive metabolic panel  Result Value Ref Range   Glucose 147 (H) 65 - 99 mg/dL   BUN 7 6 - 24 mg/dL   Creatinine, Ser 0.76 0.57 - 1.00 mg/dL   GFR calc non Af Amer 86 >59 mL/min/1.73   GFR calc Af Amer 99 >59 mL/min/1.73   BUN/Creatinine Ratio 9 9 - 23   Sodium 141 134 - 144 mmol/L   Potassium 3.9 3.5 - 5.2 mmol/L   Chloride 102 96 - 106 mmol/L   CO2 24 20 - 29 mmol/L   Calcium 9.1  8.7 - 10.2 mg/dL   Total Protein 7.0 6.0 - 8.5 g/dL   Albumin 4.1 3.8 - 4.9 g/dL   Globulin, Total 2.9 1.5 - 4.5 g/dL   Albumin/Globulin Ratio 1.4 1.2 - 2.2   Bilirubin Total 0.6 0.0 - 1.2 mg/dL   Alkaline Phosphatase 138 (H) 48 - 121 IU/L   AST 41 (H) 0 - 40 IU/L   ALT 44 (H) 0 - 32 IU/L  Lipid Panel w/o Chol/HDL Ratio  Result Value Ref Range   Cholesterol, Total 185 100 - 199 mg/dL   Triglycerides 132 0 - 149 mg/dL   HDL 40 >39 mg/dL   VLDL Cholesterol Cal 24 5 -  40 mg/dL   LDL Chol Calc (NIH) 992 (H) 0 - 99 mg/dL  UA/M w/rflx Culture, Routine   Specimen: Urine   URINE  Result Value Ref Range   Specific Gravity, UA <1.005 (L) 1.005 - 1.030   pH, UA 5.5 5.0 - 7.5   Color, UA Yellow Yellow   Appearance Ur Clear Clear   Leukocytes,UA Negative Negative   Protein,UA Negative Negative/Trace   Glucose, UA Negative Negative   Ketones, UA Negative Negative   RBC, UA Negative Negative   Bilirubin, UA Negative Negative   Urobilinogen, Ur 0.2 0.2 - 1.0 mg/dL   Nitrite, UA Negative Negative      Assessment & Plan:   Problem List Items Addressed This Visit      Cardiovascular and Mediastinum   Hypertension    Under good control on current regimen. Continue current regimen. Continue to monitor. Call with any concerns. Refills given. Labs drawn today       Relevant Medications   lisinopril (ZESTRIL) 30 MG tablet   atorvastatin (LIPITOR) 20 MG tablet   Other Relevant Orders   Comprehensive metabolic panel (Completed)   Aortic atherosclerosis (HCC)    Will keep BP and cholesterol and sugars under good control. Continue to monitor. Call with any concerns.       Relevant Medications   lisinopril (ZESTRIL) 30 MG tablet   atorvastatin (LIPITOR) 20 MG tablet     Respiratory   COPD exacerbation (HCC) - Primary    Not under good control. Will treat her with prednisone, albuterol and azithromycin. Get CXR to make sure no pneumonia. Await results. Recheck 2 weeks.       Relevant  Medications   fluticasone (FLONASE) 50 MCG/ACT nasal spray   cetirizine (ZYRTEC) 10 MG tablet   budesonide-formoterol (SYMBICORT) 160-4.5 MCG/ACT inhaler   albuterol (PROVENTIL) (2.5 MG/3ML) 0.083% nebulizer solution   predniSONE (DELTASONE) 10 MG tablet   azithromycin (ZITHROMAX) 250 MG tablet   Other Relevant Orders   DG Chest 2 View (Completed)     Endocrine   Diet-controlled diabetes mellitus (HCC)    Still elevated with A1c of 7.2- will start metformin. Continue to monitor. Call with any concerns.       Relevant Medications   lisinopril (ZESTRIL) 30 MG tablet   atorvastatin (LIPITOR) 20 MG tablet   Other Relevant Orders   Bayer DCA Hb A1c Waived (Completed)   Comprehensive metabolic panel (Completed)     Other   Hyperlipidemia    Under good control on current regimen. Continue current regimen. Continue to monitor. Call with any concerns. Refills given. Labs drawn today.        Relevant Medications   lisinopril (ZESTRIL) 30 MG tablet   atorvastatin (LIPITOR) 20 MG tablet   Other Relevant Orders   Comprehensive metabolic panel (Completed)   Lipid Panel w/o Chol/HDL Ratio (Completed)    Other Visit Diagnoses    Dysuria       UA clear. Flank pain likely due to her COPD. Call with any concerns.    Relevant Orders   UA/M w/rflx Culture, Routine (Completed)   Chest pain, unspecified type       EKG normal. Likely due to her COPD. Warning signs for which to go to the ER discussed.    Relevant Orders   EKG 12-Lead (Completed)       Follow up plan: Return in about 2 weeks (around 11/26/2019).

## 2019-11-12 NOTE — Telephone Encounter (Signed)
Noted  

## 2019-11-13 ENCOUNTER — Telehealth: Payer: Self-pay

## 2019-11-13 LAB — LIPID PANEL W/O CHOL/HDL RATIO
Cholesterol, Total: 185 mg/dL (ref 100–199)
HDL: 40 mg/dL (ref >39)
LDL Chol Calc (NIH): 121 mg/dL — ABNORMAL HIGH (ref 0–99)
Triglycerides: 132 mg/dL (ref 0–149)
VLDL Cholesterol Cal: 24 mg/dL (ref 5–40)

## 2019-11-13 LAB — COMPREHENSIVE METABOLIC PANEL
ALT: 44 IU/L — ABNORMAL HIGH (ref 0–32)
AST: 41 IU/L — ABNORMAL HIGH (ref 0–40)
Albumin/Globulin Ratio: 1.4 (ref 1.2–2.2)
Albumin: 4.1 g/dL (ref 3.8–4.9)
Alkaline Phosphatase: 138 IU/L — ABNORMAL HIGH (ref 48–121)
BUN/Creatinine Ratio: 9 (ref 9–23)
BUN: 7 mg/dL (ref 6–24)
Bilirubin Total: 0.6 mg/dL (ref 0.0–1.2)
CO2: 24 mmol/L (ref 20–29)
Calcium: 9.1 mg/dL (ref 8.7–10.2)
Chloride: 102 mmol/L (ref 96–106)
Creatinine, Ser: 0.76 mg/dL (ref 0.57–1.00)
GFR calc Af Amer: 99 mL/min/{1.73_m2} (ref >59)
GFR calc non Af Amer: 86 mL/min/{1.73_m2} (ref >59)
Globulin, Total: 2.9 g/dL (ref 1.5–4.5)
Glucose: 147 mg/dL — ABNORMAL HIGH (ref 65–99)
Potassium: 3.9 mmol/L (ref 3.5–5.2)
Sodium: 141 mmol/L (ref 134–144)
Total Protein: 7 g/dL (ref 6.0–8.5)

## 2019-11-13 NOTE — Telephone Encounter (Signed)
I received a PA for metFORMIN (GLUMETZA) 500 MG (MOD) 24 hr tablet.  It asks me has the patient tried the GENERIC to Glucophage XR. I don't see where patient has been on Metformin in the past. It will be hard to cover.  The alternatives are: Metformin HCL (Glucophage) 500mg , 850mg , and 1000mg  Tabs Metformin HCL ER (Glucophage XR) 500mg , 750mg  Metformin HCL ER (OSMOTIC) 500mg , 1000mg  tabs  Change to above or continue PA?

## 2019-11-15 MED ORDER — METFORMIN HCL ER 500 MG PO TB24: 500.0000 mg | ORAL_TABLET | Freq: Every day | ORAL | 3 refills | Status: DC

## 2019-11-15 NOTE — Assessment & Plan Note (Signed)
Will keep BP and cholesterol and sugars under good control. Continue to monitor. Call with any concerns.  

## 2019-11-15 NOTE — Assessment & Plan Note (Signed)
Not under good control. Will treat her with prednisone, albuterol and azithromycin. Get CXR to make sure no pneumonia. Await results. Recheck 2 weeks.

## 2019-11-15 NOTE — Assessment & Plan Note (Signed)
Still elevated with A1c of 7.2- will start metformin. Continue to monitor. Call with any concerns.

## 2019-11-15 NOTE — Assessment & Plan Note (Signed)
Under good control on current regimen. Continue current regimen. Continue to monitor. Call with any concerns. Refills given. Labs drawn today.   

## 2019-11-16 NOTE — Telephone Encounter (Signed)
metFORMIN (GLUCOPHAGE-XR) 500 MG 24 hr tablet sent to pharmacy by Dr. Laural Benes.

## 2019-11-19 ENCOUNTER — Ambulatory Visit: Payer: Self-pay | Admitting: Family Medicine

## 2019-11-27 ENCOUNTER — Other Ambulatory Visit: Payer: Self-pay

## 2019-11-27 ENCOUNTER — Ambulatory Visit (INDEPENDENT_AMBULATORY_CARE_PROVIDER_SITE_OTHER): Payer: BC Managed Care – PPO | Admitting: Family Medicine

## 2019-11-27 ENCOUNTER — Encounter: Payer: Self-pay | Admitting: Family Medicine

## 2019-11-27 VITALS — BP 151/88 | HR 76 | Temp 98.4°F | Ht 61.81 in | Wt 183.6 lb

## 2019-11-27 DIAGNOSIS — J441 Chronic obstructive pulmonary disease with (acute) exacerbation: Secondary | ICD-10-CM | POA: Diagnosis not present

## 2019-11-27 DIAGNOSIS — K047 Periapical abscess without sinus: Secondary | ICD-10-CM

## 2019-11-27 DIAGNOSIS — I10 Essential (primary) hypertension: Secondary | ICD-10-CM | POA: Diagnosis not present

## 2019-11-27 DIAGNOSIS — M25561 Pain in right knee: Secondary | ICD-10-CM | POA: Diagnosis not present

## 2019-11-27 DIAGNOSIS — M25562 Pain in left knee: Secondary | ICD-10-CM

## 2019-11-27 DIAGNOSIS — G8929 Other chronic pain: Secondary | ICD-10-CM

## 2019-11-27 MED ORDER — LISINOPRIL 30 MG PO TABS: 30.0000 mg | ORAL_TABLET | Freq: Every day | ORAL | 1 refills | Status: DC

## 2019-11-27 MED ORDER — AMOXICILLIN-POT CLAVULANATE 875-125 MG PO TABS
1.0000 | ORAL_TABLET | Freq: Two times a day (BID) | ORAL | 0 refills | Status: DC
Start: 2019-11-27 — End: 2020-04-26

## 2019-11-27 NOTE — Assessment & Plan Note (Signed)
Running high. Has been taking a lower dose of her medicine. Will send her Rx to local pharmacy. Call with any concerns.

## 2019-11-27 NOTE — Assessment & Plan Note (Signed)
Resolved. Lungs clear. Continue inhalers. Call with any concerns.

## 2019-11-27 NOTE — Progress Notes (Signed)
BP (!) 151/88 (BP Location: Left Arm, Patient Position: Sitting, Cuff Size: Normal)   Pulse 76   Temp 98.4 F (36.9 C) (Oral)   Ht 5' 1.81" (1.57 m)   Wt 183 lb 9.6 oz (83.3 kg)   SpO2 97%   BMI 33.79 kg/m    Subjective:    Patient ID: Monica Hull, female    DOB: 1960-09-24, 59 y.o.   MRN: [id number]  HPI: Monica Hull is a 59 y.o. female  Chief Complaint  Patient presents with  . COPD  . Knee Pain   DENTAL PAIN Duration: weeks Involved teeth: left and lower Dentist evaluation: no Mechanism of injury:  no trauma Onset: sudden Severity: severe Quality: sharp and throbbing Frequency: constant Radiation: no Aggravating factors: cold, heat, hard foods and chewing Alleviating factors: nothing Status: worse Treatments attempted: ice, APAP and NSAIDs Relief with NSAIDs?: no Fevers: no Swelling: yes Redness: yes Paresthesias / decreased sensation: no Sinus pressure: no  KNEE PAIN Duration: about a week Involved knee: bilateral R>L Mechanism of injury: unknown Location:diffuse Onset: gradual Severity: severe  Quality:  Aching sore Frequency: constant Radiation: yes Aggravating factors: weight bearing, walking, running and stairs  Alleviating factors: rest  Status: worse Treatments attempted: voltaren, rest, ice, heat and APAP  Relief with NSAIDs?:  mild Weakness with weight bearing or walking: yes Sensation of giving way: yes Locking: yes Popping: yes Bruising: no Swelling: yes Redness: no Paresthesias/decreased sensation: no Fevers: no  HYPERTENSION- waiting on her shipment from [facility], has been taking her son's lisinopril, but it's a lower dose Hypertension status: uncontrolled  Satisfied with current treatment? yes Duration of hypertension: chronic BP monitoring frequency:  not checking BP medication side effects:  no Medication compliance: good compliance Previous BP meds:lisinopril Aspirin: no Recurrent  headaches: no Visual changes: no Palpitations: no Dyspnea: no Chest pain: no Lower extremity edema: no Dizzy/lightheaded: no  Breathing has been doing a lot better. Feeling more like herself. Less coughing and wheezing and no SOB or fevers. No other concerns.   Relevant past medical, surgical, family and social history reviewed and updated as indicated. Interim medical history since our last visit reviewed. Allergies and medications reviewed and updated.  Review of Systems  Constitutional: Negative.   HENT: Negative.   Respiratory: Positive for cough and wheezing. Negative for apnea, choking, chest tightness, shortness of breath and stridor.   Cardiovascular: Negative.   Gastrointestinal: Negative.   Psychiatric/Behavioral: Negative.     Per HPI unless specifically indicated above     Objective:    BP (!) 151/88 (BP Location: Left Arm, Patient Position: Sitting, Cuff Size: Normal)   Pulse 76   Temp 98.4 F (36.9 C) (Oral)   Ht 5' 1.81" (1.57 m)   Wt 183 lb 9.6 oz (83.3 kg)   SpO2 97%   BMI 33.79 kg/m   Wt Readings from Last 3 Encounters:  11/27/19 183 lb 9.6 oz (83.3 kg)  11/12/19 183 lb 9.6 oz (83.3 kg)  09/11/19 184 lb 9.6 oz (83.7 kg)    Physical Exam Vitals and nursing note reviewed.  Constitutional:      General: She is not in acute distress.    Appearance: Normal appearance. She is not ill-appearing, toxic-appearing or diaphoretic.  HENT:     Head: Normocephalic and atraumatic.     Right Ear: External ear normal.     Left Ear: External ear normal.     Nose: Nose normal.     Mouth/Throat:  Mouth: Mucous membranes are moist.     Pharynx: Oropharynx is clear.  Eyes:     General: No scleral icterus.       Right eye: No discharge.        Left eye: No discharge.     Extraocular Movements: Extraocular movements intact.     Conjunctiva/sclera: Conjunctivae normal.     Pupils: Pupils are equal, round, and reactive to light.  Cardiovascular:     Rate and  Rhythm: Normal rate and regular rhythm.     Pulses: Normal pulses.     Heart sounds: Normal heart sounds. No murmur. No friction rub. No gallop.   Pulmonary:     Effort: Pulmonary effort is normal. No respiratory distress.     Breath sounds: Normal breath sounds. No stridor. No wheezing, rhonchi or rales.  Chest:     Chest wall: No tenderness.  Musculoskeletal:        General: Normal range of motion.     Cervical back: Normal range of motion and neck supple.  Skin:    General: Skin is warm and dry.     Capillary Refill: Capillary refill takes less than 2 seconds.     Coloration: Skin is not jaundiced or pale.     Findings: No bruising, erythema, lesion or rash.  Neurological:     General: No focal deficit present.     Mental Status: She is alert and oriented to person, place, and time. Mental status is at baseline.  Psychiatric:        Mood and Affect: Mood normal.        Behavior: Behavior normal.        Thought Content: Thought content normal.        Judgment: Judgment normal.     Results for orders placed or performed in visit on 11/12/19  Bayer DCA Hb A1c Waived  Result Value Ref Range   HB A1C (BAYER DCA - WAIVED) 7.2 (H) <7.0 %  Comprehensive metabolic panel  Result Value Ref Range   Glucose 147 (H) 65 - 99 mg/dL   BUN 7 6 - 24 mg/dL   Creatinine, Ser 0.76 0.57 - 1.00 mg/dL   GFR calc non Af Amer 86 >59 mL/min/1.73   GFR calc Af Amer 99 >59 mL/min/1.73   BUN/Creatinine Ratio 9 9 - 23   Sodium 141 134 - 144 mmol/L   Potassium 3.9 3.5 - 5.2 mmol/L   Chloride 102 96 - 106 mmol/L   CO2 24 20 - 29 mmol/L   Calcium 9.1 8.7 - 10.2 mg/dL   Total Protein 7.0 6.0 - 8.5 g/dL   Albumin 4.1 3.8 - 4.9 g/dL   Globulin, Total 2.9 1.5 - 4.5 g/dL   Albumin/Globulin Ratio 1.4 1.2 - 2.2   Bilirubin Total 0.6 0.0 - 1.2 mg/dL   Alkaline Phosphatase 138 (H) 48 - 121 IU/L   AST 41 (H) 0 - 40 IU/L   ALT 44 (H) 0 - 32 IU/L  Lipid Panel w/o Chol/HDL Ratio  Result Value Ref Range    Cholesterol, Total 185 100 - 199 mg/dL   Triglycerides 132 0 - 149 mg/dL   HDL 40 >39 mg/dL   VLDL Cholesterol Cal 24 5 - 40 mg/dL   LDL Chol Calc (NIH) 121 (H) 0 - 99 mg/dL  UA/M w/rflx Culture, Routine   Specimen: Urine   URINE  Result Value Ref Range   Specific Gravity, UA <1.005 (L) 1.005 - 1.030   pH,  UA 5.5 5.0 - 7.5   Color, UA Yellow Yellow   Appearance Ur Clear Clear   Leukocytes,UA Negative Negative   Protein,UA Negative Negative/Trace   Glucose, UA Negative Negative   Ketones, UA Negative Negative   RBC, UA Negative Negative   Bilirubin, UA Negative Negative   Urobilinogen, Ur 0.2 0.2 - 1.0 mg/dL   Nitrite, UA Negative Negative      Assessment & Plan:   Problem List Items Addressed This Visit      Cardiovascular and Mediastinum   Hypertension    Running high. Has been taking a lower dose of her medicine. Will send her Rx to local pharmacy. Call with any concerns.       Relevant Medications   lisinopril (ZESTRIL) 30 MG tablet     Respiratory   COPD exacerbation (HCC)    Resolved. Lungs clear. Continue inhalers. Call with any concerns.        Other Visit Diagnoses    Dental infection    -  Primary   Will treat with augmentin until she gets in with dentistry. Call with any concerns.    Chronic pain of both knees       Getting worse. Will get her into ortho. Referral generated today. Call with any concerns. Continue voltaren for now.    Relevant Orders   Ambulatory referral to Orthopedic Surgery       Follow up plan: Return 2.5 months, for DM.

## 2019-12-14 DIAGNOSIS — M25562 Pain in left knee: Secondary | ICD-10-CM | POA: Diagnosis not present

## 2019-12-14 DIAGNOSIS — G8929 Other chronic pain: Secondary | ICD-10-CM | POA: Diagnosis not present

## 2019-12-14 DIAGNOSIS — M25561 Pain in right knee: Secondary | ICD-10-CM | POA: Diagnosis not present

## 2019-12-14 DIAGNOSIS — M170 Bilateral primary osteoarthritis of knee: Secondary | ICD-10-CM | POA: Diagnosis not present

## 2019-12-31 ENCOUNTER — Encounter: Payer: Self-pay | Admitting: Family Medicine

## 2020-01-04 ENCOUNTER — Ambulatory Visit: Payer: Self-pay | Admitting: Family

## 2020-01-06 ENCOUNTER — Ambulatory Visit: Payer: Self-pay | Admitting: Family

## 2020-01-06 NOTE — Progress Notes (Deleted)
Office Visit    Patient Name: Monica Hull Date of Encounter: 01/06/2020  Primary Care Provider:  Dorcas Carrow, DO Primary Cardiologist:  No primary care provider on file. Electrophysiologist:  None   Chief Complaint    Monica Hull is a 59 y.o. female with a hx of GERD, COPD, HTN, HLD, tobacco use, carotid artery  presents today for follow-up of HTN, carotid stenosis  Past Medical History    Past Medical History:  Diagnosis Date  . Asthma   . Carpal tunnel syndrome   . Chronic kidney disease    H/O KIDNEY STONES  . COPD (chronic obstructive pulmonary disease) (HCC)   . Diabetes mellitus without complication (HCC)    DIET CONTROLLED  . Elevated liver function tests   . GERD (gastroesophageal reflux disease)   . Headache    MIGRAINES  . Hyperlipidemia   . Hypertension   . Impaired fasting glucose   . Sleep apnea    Not using CPAP- referred to sleep center  . Tobacco abuse    Past Surgical History:  Procedure Laterality Date  . CARPAL TUNNEL RELEASE    . CESAREAN SECTION     X2  . HYSTEROSCOPY WITH D & C N/A 07/11/2015   Procedure: DILATATION AND CURETTAGE /HYSTEROSCOPY;  Surgeon: Hildred Laser, MD;  Location: [facility];  Service: Gynecology;  Laterality: N/A;  . PALATE / UVULA BIOPSY / EXCISION      Allergies  Allergies  Allergen Reactions  . Advair Hfa [Fluticasone-Salmeterol] Rash    Rash and thrush  . Anoro Ellipta [Umeclidinium-Vilanterol] Rash    Causes patients tongue to break out  . Biaxin [Clarithromycin] Nausea And Vomiting and Rash    History of Present Illness    Monica Hull is a 59 y.o. female with a hx of GERD, HLD, COPD, HTN, tobacco use, carotid artery stenosis last seen 02/2018 by Dr. Mariah Milling.  When seen 02/2018 Zetia was added to her regimen of Lipitor 20 mg daily.  Her blood pressure was mildly elevated in the setting of stress and she was not recommended for increased antihypertensive medication due to  episodes of lightheadedness, near syncope, likely vasovagal.  She had carotid duplex 04/2018 with no hemodynamically significant plaque noted in bilateral CCA with bilateral ICA less than 50% stenosed.  Noted left thyroid anechoic, avascular area 0.6 cm X0 0.6 cm.  Seen by primary care 6-4/21 with resolution of COPD exacerbation.  She has been seen in consult 12/14/2019 by orthopedic surgery for knee pain with plan for injections.  EKGs/Labs/Other Studies Reviewed:   The following studies were reviewed today:  Carotid duplex 04/2018 Anechoic, avascular area seen in the left thyroid, measuring 0.6cm x  0.6cm. Area has a hyperechoic border    Summary:  Right Carotid: Non-hemodynamically significant plaque <50% noted in the  CCA. The                 ECA appears <50% stenosed. The extracranial vessels were                 near-normal with only minimal wall thickening or plaque.   Left Carotid: Non-hemodynamically significant plaque noted in the CCA. The  ECA               appears <50% stenosed. The extracranial vessels were  near-normal               with only minimal wall thickening or plaque.   Vertebrals:  Bilateral vertebral arteries demonstrate antegrade flow.  Subclavians: Normal flow hemodynamics were seen in bilateral subclavian               arteries.    EKG:  EKG is ordered today.  The ekg ordered today demonstrates ***  Recent Labs: 02/27/2019: Hemoglobin 15.2; Platelets 248; TSH 3.350 11/12/2019: ALT 44; BUN 7; Creatinine, Ser 0.76; Potassium 3.9; Sodium 141  Recent Lipid Panel    Component Value Date/Time   CHOL 185 11/12/2019 0940   CHOL 195 05/23/2018 1053   TRIG 132 11/12/2019 0940   TRIG 183 (H) 05/23/2018 1053   HDL 40 11/12/2019 0940   VLDL 37 (H) 05/23/2018 1053   LDLCALC 121 (H) 11/12/2019 0940    Home Medications   No outpatient medications have been marked as taking for the 01/06/20 encounter (Appointment) with Alver Sorrow, NP.      Review of  Systems    ***   ROS All other systems reviewed and are otherwise negative except as noted above.  Physical Exam    VS:  There were no vitals taken for this visit. , BMI There is no height or weight on file to calculate BMI. GEN: Well nourished, well developed, in no acute distress. HEENT: normal. Neck: Supple, no JVD, carotid bruits, or masses. Cardiac: ***RRR, no murmurs, rubs, or gallops. No clubbing, cyanosis, edema.  ***Radials/DP/PT 2+ and equal bilaterally.  Respiratory:  ***Respirations regular and unlabored, clear to auscultation bilaterally. GI: Soft, nontender, nondistended, BS + x 4. MS: No deformity or atrophy. Skin: Warm and dry, no rash. Neuro:  Strength and sensation are intact. Psych: Normal affect.  Assessment & Plan    1. HTN-  2. HLD-11/12/2019 total cholesterol 185, HDL 40, LDL 121. 3. COPD-  4. Carotid stenosis -  5. Thyroid nodule -   Disposition: Follow up {follow up:15908} with ***   Alver Sorrow, NP 01/06/2020, 1:55 PM

## 2020-01-11 ENCOUNTER — Encounter: Payer: Self-pay | Admitting: Family

## 2020-01-21 DIAGNOSIS — M25561 Pain in right knee: Secondary | ICD-10-CM | POA: Diagnosis not present

## 2020-01-21 DIAGNOSIS — M25562 Pain in left knee: Secondary | ICD-10-CM | POA: Diagnosis not present

## 2020-01-21 DIAGNOSIS — G8929 Other chronic pain: Secondary | ICD-10-CM | POA: Diagnosis not present

## 2020-01-21 DIAGNOSIS — M170 Bilateral primary osteoarthritis of knee: Secondary | ICD-10-CM | POA: Diagnosis not present

## 2020-01-27 ENCOUNTER — Ambulatory Visit: Payer: BC Managed Care – PPO | Admitting: Family Medicine

## 2020-01-28 ENCOUNTER — Ambulatory Visit: Payer: Self-pay | Admitting: Family

## 2020-02-04 ENCOUNTER — Ambulatory Visit: Payer: BC Managed Care – PPO | Admitting: Family Medicine

## 2020-02-12 ENCOUNTER — Ambulatory Visit: Payer: Self-pay | Admitting: Family

## 2020-02-12 ENCOUNTER — Encounter: Payer: Self-pay | Admitting: Family Medicine

## 2020-02-16 DIAGNOSIS — M25512 Pain in left shoulder: Secondary | ICD-10-CM | POA: Diagnosis not present

## 2020-02-16 DIAGNOSIS — M19011 Primary osteoarthritis, right shoulder: Secondary | ICD-10-CM | POA: Diagnosis not present

## 2020-02-16 DIAGNOSIS — M7541 Impingement syndrome of right shoulder: Secondary | ICD-10-CM | POA: Diagnosis not present

## 2020-02-16 DIAGNOSIS — G8929 Other chronic pain: Secondary | ICD-10-CM | POA: Diagnosis not present

## 2020-02-16 DIAGNOSIS — M25511 Pain in right shoulder: Secondary | ICD-10-CM | POA: Diagnosis not present

## 2020-03-29 ENCOUNTER — Encounter: Payer: Self-pay | Admitting: *Deleted

## 2020-03-29 ENCOUNTER — Telehealth: Payer: Self-pay | Admitting: *Deleted

## 2020-03-29 DIAGNOSIS — Z87891 Personal history of nicotine dependence: Secondary | ICD-10-CM

## 2020-03-29 DIAGNOSIS — Z122 Encounter for screening for malignant neoplasm of respiratory organs: Secondary | ICD-10-CM

## 2020-03-29 NOTE — Telephone Encounter (Signed)
Attempted to contact and schedule lung screening scan. Message left for patient to call back to schedule. 

## 2020-03-31 NOTE — Telephone Encounter (Signed)
Attempted to contact and schedule lung screening scan. Message left for patient to call back to schedule. 

## 2020-04-01 NOTE — Telephone Encounter (Signed)
Contacted and scheduled. Current smoker, 60 pack year

## 2020-04-01 NOTE — Addendum Note (Signed)
Addended by: Jonne Ply on: 04/01/2020 08:35 AM   Modules accepted: Orders

## 2020-04-19 ENCOUNTER — Ambulatory Visit: Payer: BC Managed Care – PPO | Admitting: Family Medicine

## 2020-04-20 ENCOUNTER — Other Ambulatory Visit: Payer: Self-pay

## 2020-04-20 ENCOUNTER — Ambulatory Visit
Admission: RE | Admit: 2020-04-20 | Discharge: 2020-04-20 | Disposition: A | Discharge status: Home / Self Care | Payer: BC Managed Care – PPO | Source: Ambulatory Visit | Attending: Nurse Practitioner | Admitting: Nurse Practitioner

## 2020-04-20 DIAGNOSIS — Z87891 Personal history of nicotine dependence: Secondary | ICD-10-CM | POA: Diagnosis not present

## 2020-04-20 DIAGNOSIS — Z122 Encounter for screening for malignant neoplasm of respiratory organs: Secondary | ICD-10-CM | POA: Diagnosis not present

## 2020-04-20 DIAGNOSIS — F17210 Nicotine dependence, cigarettes, uncomplicated: Secondary | ICD-10-CM | POA: Diagnosis not present

## 2020-04-21 ENCOUNTER — Telehealth: Payer: Self-pay | Admitting: *Deleted

## 2020-04-21 DIAGNOSIS — I2510 Atherosclerotic heart disease of native coronary artery without angina pectoris: Secondary | ICD-10-CM

## 2020-04-21 DIAGNOSIS — I77810 Thoracic aortic ectasia: Secondary | ICD-10-CM

## 2020-04-21 NOTE — Telephone Encounter (Signed)
Notified patient of LDCT lung cancer screening program results with recommendation for 12 month follow up imaging. Also notified of incidental findings noted below and is encouraged to discuss further with PCP who will receive a copy of this note and/or the CT report. Patient verbalizes understanding.   IMPRESSION: 1. Lung-RADS 2S, benign appearance or behavior. Continue annual screening with low-dose chest CT without contrast in 12 months. 2. The "S" modifier above refers to potentially clinically significant non lung cancer related findings. Specifically, there is aortic atherosclerosis, in addition to 3 vessel coronary artery disease. Please note that although the presence of coronary artery calcium documents the presence of coronary artery disease, the severity of this disease and any potential stenosis cannot be assessed on this non-gated CT examination. Assessment for potential risk factor modification, dietary therapy or pharmacologic therapy may be warranted, if clinically indicated. 3. There is also ectasia of the ascending thoracic aorta (4.0 cm in diameter). Attention at time of repeat annual low-dose lung cancer screening chest CT is recommended to ensure continued stability. 4. Mild diffuse bronchial wall thickening with mild centrilobular and paraseptal emphysema; imaging findings suggestive of underlying COPD.  Aortic Atherosclerosis (ICD10-I70.0) and Emphysema (ICD10-J43.9).

## 2020-04-22 DIAGNOSIS — M7541 Impingement syndrome of right shoulder: Secondary | ICD-10-CM | POA: Diagnosis not present

## 2020-04-22 DIAGNOSIS — M25512 Pain in left shoulder: Secondary | ICD-10-CM | POA: Diagnosis not present

## 2020-04-22 DIAGNOSIS — M19011 Primary osteoarthritis, right shoulder: Secondary | ICD-10-CM | POA: Diagnosis not present

## 2020-04-22 DIAGNOSIS — M25511 Pain in right shoulder: Secondary | ICD-10-CM | POA: Diagnosis not present

## 2020-04-26 ENCOUNTER — Ambulatory Visit (INDEPENDENT_AMBULATORY_CARE_PROVIDER_SITE_OTHER): Payer: BC Managed Care – PPO | Admitting: Nurse Practitioner

## 2020-04-26 ENCOUNTER — Encounter: Payer: Self-pay | Admitting: Nurse Practitioner

## 2020-04-26 DIAGNOSIS — J069 Acute upper respiratory infection, unspecified: Secondary | ICD-10-CM | POA: Diagnosis not present

## 2020-04-26 MED ORDER — PREDNISONE 20 MG PO TABS: 20.0000 mg | ORAL_TABLET | Freq: Every day | ORAL | 0 refills | Status: DC

## 2020-04-26 NOTE — Assessment & Plan Note (Signed)
Acute, ongoing.  With cough ongoing for weeks, worried about pneumonia however low dose CT scan from last week did not show any acute processes in lungs.  Likely allergic rhinitis vs. Viral syndrome.  Encouraged patient to start allergic rhinitis regimen and will also start burst of prednisone x 4 days.  Continue inhaler regimen and use of albuterol/nebulizer until shortness of breath resolves.  If symptoms persist or worsen, consider chest x-ray and/or antibiotics.

## 2020-04-26 NOTE — Progress Notes (Signed)
Ht 5\' 1"  (1.549 m)   BMI 34.58 kg/m    Subjective:    Patient ID: , female    DOB: Jan 01, 1961, 59 y.o.   MRN: [id number]  HPI: Monica Hull is a 59 y.o. female presenting with upper respiratory tract infection symptoms.  Chief Complaint  Patient presents with  . Cough    X1-2 weeks, SOB started last night, congested, no fever, no headaches, no chills    UPPER RESPIRATORY TRACT INFECTION Onset: coughing has been present for several weeks; got stopped up about 2 days ago.  Sunday night, breathing woke her up.  Worst symptom: breathing Fever: no Cough: yes - dry, hacky Shortness of breath: yes - can't catch breath Wheezing: yes; using albuterol inhaler and nebulizer and "seems to help some" Chest pain: no Chest tightness: no Chest congestion: yes Nasal congestion: yes Runny nose: no Post nasal drip: yes Sneezing: no Sore throat: yes Swollen glands: no Sinus pressure: yes Headache: no Face pain: no Toothache: no Ear pain: no  Ear pressure: no  Eyes red/itching:no Eye drainage/crusting: no  Nausea: no Vomiting: no  Appetite: no change Rash: no Fatigue: no Sick contacts: no Strep contacts: yes ; grand daughter an daughter have had strep throat Context: stable Recurrent sinusitis: no Relief with OTC cold/cough medications: no  Treatments attempted: Symbicort, Spiriva (does these every day), increasing nebulizer usage and albuterol recently, Tylenol; tessalon pearls  Allergies  Allergen Reactions  . Advair Hfa [Fluticasone-Salmeterol] Rash    Rash and thrush  . Anoro Ellipta [Umeclidinium-Vilanterol] Rash    Causes patients tongue to break out  . Biaxin [Clarithromycin] Nausea And Vomiting and Rash  . Fluticasone Rash   Outpatient Encounter Medications as of 04/26/2020  Medication Sig  . albuterol (PROVENTIL) (2.5 MG/3ML) 0.083% nebulizer solution Take 3 mLs (2.5 mg total) by nebulization every 6 (six) hours as needed for  wheezing or shortness of breath.  13/07/2019 albuterol (VENTOLIN HFA) 108 (90 Base) MCG/ACT inhaler Inhale 2 puffs into the lungs every 6 (six) hours as needed for wheezing or shortness of breath.  . budesonide-formoterol (SYMBICORT) 160-4.5 MCG/ACT inhaler Inhale 2 puffs into the lungs 2 (two) times daily.  . cetirizine (ZYRTEC) 10 MG tablet Take 1 tablet (10 mg total) by mouth daily.  . diazepam (VALIUM) 5 MG tablet Take 5 mg by mouth 2 (two) times daily as needed.  . diclofenac (VOLTAREN) 50 MG EC tablet Take 1 tablet (50 mg total) by mouth 4 (four) times daily.  . fesoterodine (TOVIAZ) 8 MG TB24 tablet Take 1 tablet (8 mg total) by mouth daily.  . fluticasone (FLONASE) 50 MCG/ACT nasal spray Place 2 sprays into both nostrils daily.  Marland Kitchen lisinopril (ZESTRIL) 30 MG tablet Take 1 tablet (30 mg total) by mouth daily.  . metFORMIN (GLUCOPHAGE-XR) 500 MG 24 hr tablet Take 1 tablet (500 mg total) by mouth daily with breakfast.  . nystatin (MYCOSTATIN) 100000 UNIT/ML suspension Take 5 mLs (500,000 Units total) by mouth 4 (four) times daily.  . pantoprazole (PROTONIX) 40 MG tablet Take 1 tablet (40 mg total) by mouth every morning.  Marland Kitchen Spacer/Aero-Holding Chambers (AEROCHAMBER MV) inhaler Use as instructed  . Tiotropium Bromide Monohydrate (SPIRIVA RESPIMAT) 2.5 MCG/ACT AERS Inhale 2 puffs into the lungs daily.  Marland Kitchen tiZANidine (ZANAFLEX) 4 MG capsule Take 4 mg by mouth 3 (three) times daily as needed for muscle spasms. Reported on 07/11/2015  . triamcinolone cream (KENALOG) 0.1 % Apply 1 application topically 2 (two) times daily. (  Patient not taking: Reported on 04/26/2020)  . [DISCONTINUED] amoxicillin-clavulanate (AUGMENTIN) 875-125 MG tablet Take 1 tablet by mouth 2 (two) times daily. (Patient not taking: Reported on 04/26/2020)  . [DISCONTINUED] atorvastatin (LIPITOR) 20 MG tablet Take 1 tablet (20 mg total) by mouth daily. (Patient not taking: Reported on 04/26/2020)   No facility-administered encounter medications  on file as of 04/26/2020.   Patient Active Problem List   Diagnosis Date Noted  . Aortic atherosclerosis (HCC) 04/08/2019  . COPD exacerbation (HCC) 02/20/2019  . Syncope 03/24/2018  . Allergic rhinitis 02/27/2017  . Carotid stenosis 02/16/2016  . URI (upper respiratory infection) 01/10/2016  . Tobacco abuse counseling 01/10/2016  . Thyroid nodule 11/01/2015  . Diet-controlled diabetes mellitus (HCC) 08/18/2015  . PMB (postmenopausal bleeding) 06/30/2015  . Obesity (BMI 30.0-34.9) 06/30/2015  . Left arm pain 02/01/2015  . Paresthesias 02/01/2015  . DJD (degenerative joint disease), cervical 02/01/2015  . Stress incontinence 12/21/2014  . Asthma   . GERD (gastroesophageal reflux disease)   . Sleep apnea   . Hyperlipidemia   . Hypertension   . COPD (chronic obstructive pulmonary disease) (HCC)   . Nicotine dependence, cigarettes, w unsp disorders    Past Medical History:  Diagnosis Date  . Asthma   . Carpal tunnel syndrome   . Chronic kidney disease    H/O KIDNEY STONES  . COPD (chronic obstructive pulmonary disease) (HCC)   . Diabetes mellitus without complication (HCC)    DIET CONTROLLED  . Elevated liver function tests   . GERD (gastroesophageal reflux disease)   . Headache    MIGRAINES  . Hyperlipidemia   . Hypertension   . Impaired fasting glucose   . Sleep apnea    Not using CPAP- referred to sleep center  . Tobacco abuse    Relevant past medical, surgical, family and social history reviewed and updated as indicated. Interim medical history since our last visit reviewed.  Review of Systems  Constitutional: Negative.  Negative for activity change, appetite change, chills, fatigue and fever.  HENT: Positive for congestion, postnasal drip, sinus pressure, sinus pain and sore throat. Negative for ear discharge, ear pain, rhinorrhea, sneezing and trouble swallowing.   Eyes: Negative.  Negative for discharge, redness and itching.  Respiratory: Positive for cough,  shortness of breath and wheezing. Negative for apnea and chest tightness.   Cardiovascular: Negative.  Negative for chest pain.  Gastrointestinal: Negative.  Negative for constipation, diarrhea, nausea and vomiting.  Skin: Negative.  Negative for rash.  Neurological: Negative.  Negative for weakness, light-headedness and headaches.  Psychiatric/Behavioral: Negative.     Per HPI unless specifically indicated above     Objective:    Ht 5\' 1"  (1.549 m)   BMI 34.58 kg/m   Wt Readings from Last 3 Encounters:  04/20/20 183 lb (83 kg)  11/27/19 183 lb 9.6 oz (83.3 kg)  11/12/19 183 lb 9.6 oz (83.3 kg)    Physical Exam Vitals and nursing note reviewed.  Constitutional:      General: She is not in acute distress.    Appearance: Normal appearance. She is not ill-appearing or toxic-appearing.  HENT:     Head: Normocephalic and atraumatic.     Right Ear: External ear normal.     Left Ear: External ear normal.     Nose: Nose normal. No congestion or rhinorrhea.     Mouth/Throat:     Mouth: Mucous membranes are moist.     Pharynx: Oropharynx is clear.  Eyes:  General: No scleral icterus.       Right eye: No discharge.        Left eye: No discharge.     Extraocular Movements: Extraocular movements intact.  Cardiovascular:     Comments: Unable to assess heart sounds via virtual visit Pulmonary:     Effort: Pulmonary effort is normal. No respiratory distress.     Comments: Unable to assess lung sounds via virtual visit; patient talking in complete sentences without accessory muscle use Abdominal:     Comments: Unable to assess bowel sounds via virtual visit  Musculoskeletal:        General: Normal range of motion.     Cervical back: Normal range of motion.  Skin:    Coloration: Skin is not jaundiced or pale.     Findings: No erythema.  Neurological:     Mental Status: She is alert and oriented to person, place, and time.  Psychiatric:        Mood and Affect: Mood normal.          Behavior: Behavior normal.        Thought Content: Thought content normal.        Judgment: Judgment normal.       Assessment & Plan:   Problem List Items Addressed This Visit      Respiratory   URI (upper respiratory infection)    Acute, ongoing.  With cough ongoing for weeks, worried about pneumonia however low dose CT scan from last week did not show any acute processes in lungs.  Likely allergic rhinitis vs. Viral syndrome.  Encouraged patient to start allergic rhinitis regimen and will also start burst of prednisone x 4 days.  Continue inhaler regimen and use of albuterol/nebulizer until shortness of breath resolves.  If symptoms persist or worsen, consider chest x-ray and/or antibiotics.           Follow up plan: No follow-ups on file.  Due to the catastrophic nature of the COVID-19 pandemic, this visit was completed via audio and visual contact via [facility] due to the restrictions of the COVID-19 pandemic. All issues as above were discussed and addressed. Physical exam was done as above through visual confirmation on [employer]. If it was felt that the patient should be evaluated in the office, they were directed there. The patient verbally consented to this visit."} . Location of the patient: home . Location of the provider: home . Those involved with this call:  . Provider: Mardene Celeste, DNP . CMA: Laurian Brim, CMA . Front Desk/Registration: Harriet Pho  . Time spent on call: 12 minutes with patient face to face via video conference. More than 50% of this time was spent in counseling and coordination of care. 20 minutes total spent in review of patient's record and preparation of their chart.  I verified patient identity using two factors (patient name and date of birth). Patient consents verbally to being seen via telemedicine visit today.

## 2020-04-26 NOTE — Patient Instructions (Signed)

## 2020-05-02 ENCOUNTER — Ambulatory Visit: Payer: BC Managed Care – PPO | Admitting: Family Medicine

## 2020-05-02 ENCOUNTER — Encounter: Payer: Self-pay | Admitting: Family Medicine

## 2020-05-02 ENCOUNTER — Telehealth: Payer: Self-pay | Admitting: Family Medicine

## 2020-05-02 ENCOUNTER — Other Ambulatory Visit: Payer: Self-pay

## 2020-05-02 VITALS — BP 117/81 | HR 78 | Temp 98.1°F | Wt 181.0 lb

## 2020-05-02 DIAGNOSIS — E782 Mixed hyperlipidemia: Secondary | ICD-10-CM | POA: Diagnosis not present

## 2020-05-02 DIAGNOSIS — H538 Other visual disturbances: Secondary | ICD-10-CM

## 2020-05-02 DIAGNOSIS — J441 Chronic obstructive pulmonary disease with (acute) exacerbation: Secondary | ICD-10-CM

## 2020-05-02 DIAGNOSIS — E119 Type 2 diabetes mellitus without complications: Secondary | ICD-10-CM

## 2020-05-02 DIAGNOSIS — Z1231 Encounter for screening mammogram for malignant neoplasm of breast: Secondary | ICD-10-CM

## 2020-05-02 DIAGNOSIS — I10 Essential (primary) hypertension: Secondary | ICD-10-CM | POA: Diagnosis not present

## 2020-05-02 LAB — MICROALBUMIN, URINE WAIVED
Creatinine, Urine Waived: 100 mg/dL (ref 10–300)
Microalb, Ur Waived: 10 mg/L (ref 0–19)
Microalb/Creat Ratio: <30 mg/g (ref <30)

## 2020-05-02 LAB — BAYER DCA HB A1C WAIVED: HB A1C (BAYER DCA - WAIVED): 6.9 % (ref <7.0)

## 2020-05-02 MED ORDER — LISINOPRIL 30 MG PO TABS
30.0000 mg | ORAL_TABLET | Freq: Every day | ORAL | 1 refills | Status: DC
Start: 2020-05-02 — End: 2020-05-17

## 2020-05-02 MED ORDER — ATORVASTATIN CALCIUM 80 MG PO TABS: 80.0000 mg | ORAL_TABLET | ORAL | 0 refills | Status: DC

## 2020-05-02 MED ORDER — PANTOPRAZOLE SODIUM 40 MG PO TBEC
40.0000 mg | DELAYED_RELEASE_TABLET | Freq: Every morning | ORAL | 1 refills | Status: DC
Start: 2020-05-02 — End: 2020-12-20

## 2020-05-02 MED ORDER — DICLOFENAC SODIUM 50 MG PO TBEC
50.0000 mg | DELAYED_RELEASE_TABLET | Freq: Four times a day (QID) | ORAL | 6 refills | Status: DC
Start: 2020-05-02 — End: 2021-01-05

## 2020-05-02 MED ORDER — PREDNISONE 10 MG PO TABS: ORAL_TABLET | ORAL | 0 refills | Status: DC

## 2020-05-02 MED ORDER — METFORMIN HCL ER 500 MG PO TB24
500.0000 mg | ORAL_TABLET | Freq: Every day | ORAL | 1 refills | Status: DC
Start: 2020-05-02 — End: 2020-08-05

## 2020-05-02 NOTE — Assessment & Plan Note (Signed)
Rechecking labs today. Await results. Treat as needed.  °

## 2020-05-02 NOTE — Assessment & Plan Note (Signed)
Still very wheezy. Will do another 12 day taper of prednisone and recheck 2 weeks. Call with any concerns. Inhaler refills up to date. Continue to follow with pulmonology.

## 2020-05-02 NOTE — Progress Notes (Signed)
BP 117/81   Pulse 78   Temp 98.1 F (36.7 C)   Wt 181 lb (82.1 kg)   SpO2 98%   BMI 34.20 kg/m    Subjective:    Patient ID: Monica Hull, female    DOB: 12-Oct-1960, 59 y.o.   MRN: [id number]  HPI: Monica Hull is a 59 y.o. female  Chief Complaint  Patient presents with  . Diabetes  . Hypertension   Just finished a 4 day burst of predniosne for a COPD exacerbation. Still wheezing. Still SOB. Still tired, but feeling better.   HYPERTENSION / HYPERLIPIDEMIA Satisfied with current treatment? yes Duration of hypertension: chronic BP monitoring frequency: not checking BP medication side effects: no Past BP meds: lisinopril Duration of hyperlipidemia: chronic Cholesterol medication side effects: no Cholesterol supplements: none Past cholesterol medications: atorvastatin Medication compliance: fair compliance Aspirin: no Recent stressors: no Recurrent headaches: no Visual changes: no Palpitations: no Dyspnea: no Chest pain: no Lower extremity edema: no Dizzy/lightheaded: no  DIABETES Hypoglycemic episodes:no Polydipsia/polyuria: no Visual disturbance: no Chest pain: no Paresthesias: yes Glucose Monitoring: yes  Accucheck frequency: occasionally Taking Insulin?: no Blood Pressure Monitoring: not checking Retinal Examination: Not up to Date Foot Exam: Up to Date Diabetic Education: Completed Pneumovax: Up to Date Influenza: Up to Date Aspirin: yes  Relevant past medical, surgical, family and social history reviewed and updated as indicated. Interim medical history since our last visit reviewed. Allergies and medications reviewed and updated.  Review of Systems  Per HPI unless specifically indicated above     Objective:    BP 117/81   Pulse 78   Temp 98.1 F (36.7 C)   Wt 181 lb (82.1 kg)   SpO2 98%   BMI 34.20 kg/m   Wt Readings from Last 3 Encounters:  05/02/20 181 lb (82.1 kg)  04/20/20 183 lb (83 kg)  11/27/19 183 lb  9.6 oz (83.3 kg)    Physical Exam Vitals and nursing note reviewed.  Constitutional:      General: She is not in acute distress.    Appearance: Normal appearance. She is not ill-appearing, toxic-appearing or diaphoretic.  HENT:     Head: Normocephalic and atraumatic.     Right Ear: External ear normal.     Left Ear: External ear normal.     Nose: Nose normal.     Mouth/Throat:     Mouth: Mucous membranes are moist.     Pharynx: Oropharynx is clear.  Eyes:     General: No scleral icterus.       Right eye: No discharge.        Left eye: No discharge.     Extraocular Movements: Extraocular movements intact.     Conjunctiva/sclera: Conjunctivae normal.     Pupils: Pupils are equal, round, and reactive to light.  Cardiovascular:     Rate and Rhythm: Normal rate and regular rhythm.     Pulses: Normal pulses.     Heart sounds: Normal heart sounds. No murmur heard.  No friction rub. No gallop.   Pulmonary:     Effort: Pulmonary effort is normal. No respiratory distress.     Breath sounds: No stridor. Wheezing and rhonchi present. No rales.  Chest:     Chest wall: No tenderness.  Musculoskeletal:        General: Normal range of motion.     Cervical back: Normal range of motion and neck supple.  Skin:    General: Skin is warm and dry.  Capillary Refill: Capillary refill takes less than 2 seconds.     Coloration: Skin is not jaundiced or pale.     Findings: No bruising, erythema, lesion or rash.  Neurological:     General: No focal deficit present.     Mental Status: She is alert and oriented to person, place, and time. Mental status is at baseline.  Psychiatric:        Mood and Affect: Mood normal.        Behavior: Behavior normal.        Thought Content: Thought content normal.        Judgment: Judgment normal.     Results for orders placed or performed in visit on 11/12/19  Bayer DCA Hb A1c Waived  Result Value Ref Range   HB A1C (BAYER DCA - WAIVED) 7.2 (H) <7.0 %   Comprehensive metabolic panel  Result Value Ref Range   Glucose 147 (H) 65 - 99 mg/dL   BUN 7 6 - 24 mg/dL   Creatinine, Ser 1.61 0.57 - 1.00 mg/dL   GFR calc non Af Amer 86 >59 mL/min/1.73   GFR calc Af Amer 99 >59 mL/min/1.73   BUN/Creatinine Ratio 9 9 - 23   Sodium 141 134 - 144 mmol/L   Potassium 3.9 3.5 - 5.2 mmol/L   Chloride 102 96 - 106 mmol/L   CO2 24 20 - 29 mmol/L   Calcium 9.1 8.7 - 10.2 mg/dL   Total Protein 7.0 6.0 - 8.5 g/dL   Albumin 4.1 3.8 - 4.9 g/dL   Globulin, Total 2.9 1.5 - 4.5 g/dL   Albumin/Globulin Ratio 1.4 1.2 - 2.2   Bilirubin Total 0.6 0.0 - 1.2 mg/dL   Alkaline Phosphatase 138 (H) 48 - 121 IU/L   AST 41 (H) 0 - 40 IU/L   ALT 44 (H) 0 - 32 IU/L  Lipid Panel w/o Chol/HDL Ratio  Result Value Ref Range   Cholesterol, Total 185 100 - 199 mg/dL   Triglycerides 096 0 - 149 mg/dL   HDL 40 >04 mg/dL   VLDL Cholesterol Cal 24 5 - 40 mg/dL   LDL Chol Calc (NIH) 540 (H) 0 - 99 mg/dL  UA/M w/rflx Culture, Routine   Specimen: Urine   URINE  Result Value Ref Range   Specific Gravity, UA <1.005 (L) 1.005 - 1.030   pH, UA 5.5 5.0 - 7.5   Color, UA Yellow Yellow   Appearance Ur Clear Clear   Leukocytes,UA Negative Negative   Protein,UA Negative Negative/Trace   Glucose, UA Negative Negative   Ketones, UA Negative Negative   RBC, UA Negative Negative   Bilirubin, UA Negative Negative   Urobilinogen, Ur 0.2 0.2 - 1.0 mg/dL   Nitrite, UA Negative Negative      Assessment & Plan:   Problem List Items Addressed This Visit      Cardiovascular and Mediastinum   Hypertension    Under good control on current regimen. Continue current regimen. Continue to monitor. Call with any concerns. Refills given. Labs drawn today.        Relevant Medications   atorvastatin (LIPITOR) 80 MG tablet   lisinopril (ZESTRIL) 30 MG tablet     Respiratory   COPD (chronic obstructive pulmonary disease) (HCC)    Still very wheezy. Will do another 12 day taper of prednisone  and recheck 2 weeks. Call with any concerns. Inhaler refills up to date. Continue to follow with pulmonology.       Relevant Medications  predniSONE (DELTASONE) 10 MG tablet   Other Relevant Orders   Comprehensive metabolic panel     Endocrine   Diet-controlled diabetes mellitus (HCC) - Primary    Rechecking labs today. Await results. Treat as needed.       Relevant Medications   atorvastatin (LIPITOR) 80 MG tablet   metFORMIN (GLUCOPHAGE-XR) 500 MG 24 hr tablet   lisinopril (ZESTRIL) 30 MG tablet   Other Relevant Orders   Bayer DCA Hb A1c Waived   Comprehensive metabolic panel   Microalbumin, Urine Waived   Ambulatory referral to Ophthalmology     Other   Hyperlipidemia    Has not been taking her medicine because she forgets it. Will increase dose and change to 1x a week dosing. Recheck 3 months.       Relevant Medications   atorvastatin (LIPITOR) 80 MG tablet   lisinopril (ZESTRIL) 30 MG tablet   Other Relevant Orders   Comprehensive metabolic panel   Lipid Panel w/o Chol/HDL Ratio    Other Visit Diagnoses    Essential hypertension       Relevant Medications   atorvastatin (LIPITOR) 80 MG tablet   lisinopril (ZESTRIL) 30 MG tablet   Other Relevant Orders   Comprehensive metabolic panel   Microalbumin, Urine Waived   Blurred vision       Needs eye exam. Referral generated today.    Relevant Orders   Ambulatory referral to Ophthalmology   Encounter for screening mammogram for malignant neoplasm of breast       Mammogram ordered today.   Relevant Orders   MM 3D SCREEN BREAST BILATERAL       Follow up plan: Return 2 weeks lung recheck, 3 months physical.

## 2020-05-02 NOTE — Telephone Encounter (Signed)
Pharmacy called to verify if directions for atorvastatin (LIPITOR) 80 MG tablet  To take 1 tab once a week was correct/ please advise

## 2020-05-02 NOTE — Telephone Encounter (Signed)
Verified that directions are correct in the patient's chart. Tried calling the pharmacy back, was on hold waiting for an answer for almost 20 minutes. Will try to call back in the morning.

## 2020-05-02 NOTE — Assessment & Plan Note (Signed)
Under good control on current regimen. Continue current regimen. Continue to monitor. Call with any concerns. Refills given. Labs drawn today.   

## 2020-05-02 NOTE — Assessment & Plan Note (Signed)
Has not been taking her medicine because she forgets it. Will increase dose and change to 1x a week dosing. Recheck 3 months.

## 2020-05-03 LAB — COMPREHENSIVE METABOLIC PANEL
ALT: 42 IU/L — ABNORMAL HIGH (ref 0–32)
AST: 34 IU/L (ref 0–40)
Albumin/Globulin Ratio: 1.5 (ref 1.2–2.2)
Albumin: 4.3 g/dL (ref 3.8–4.9)
Alkaline Phosphatase: 137 IU/L — ABNORMAL HIGH (ref 44–121)
BUN/Creatinine Ratio: 12 (ref 12–28)
BUN: 8 mg/dL (ref 8–27)
Bilirubin Total: 0.7 mg/dL (ref 0.0–1.2)
CO2: 22 mmol/L (ref 20–29)
Calcium: 9.6 mg/dL (ref 8.7–10.3)
Chloride: 105 mmol/L (ref 96–106)
Creatinine, Ser: 0.68 mg/dL (ref 0.57–1.00)
GFR calc Af Amer: 110 mL/min/{1.73_m2} (ref >59)
GFR calc non Af Amer: 95 mL/min/{1.73_m2} (ref >59)
Globulin, Total: 2.8 g/dL (ref 1.5–4.5)
Glucose: 154 mg/dL — ABNORMAL HIGH (ref 65–99)
Potassium: 4.2 mmol/L (ref 3.5–5.2)
Sodium: 141 mmol/L (ref 134–144)
Total Protein: 7.1 g/dL (ref 6.0–8.5)

## 2020-05-03 LAB — LIPID PANEL W/O CHOL/HDL RATIO
Cholesterol, Total: 220 mg/dL — ABNORMAL HIGH (ref 100–199)
HDL: 44 mg/dL (ref >39)
LDL Chol Calc (NIH): 154 mg/dL — ABNORMAL HIGH (ref 0–99)
Triglycerides: 120 mg/dL (ref 0–149)
VLDL Cholesterol Cal: 22 mg/dL (ref 5–40)

## 2020-05-03 NOTE — Telephone Encounter (Signed)
Pharmacy notified.

## 2020-05-05 DIAGNOSIS — M5030 Other cervical disc degeneration, unspecified cervical region: Secondary | ICD-10-CM | POA: Diagnosis not present

## 2020-05-05 DIAGNOSIS — M5412 Radiculopathy, cervical region: Secondary | ICD-10-CM | POA: Diagnosis not present

## 2020-05-10 ENCOUNTER — Other Ambulatory Visit: Payer: Self-pay | Admitting: Family Medicine

## 2020-05-10 DIAGNOSIS — Z1231 Encounter for screening mammogram for malignant neoplasm of breast: Secondary | ICD-10-CM

## 2020-05-16 ENCOUNTER — Ambulatory Visit: Payer: BC Managed Care – PPO | Admitting: Family Medicine

## 2020-05-17 ENCOUNTER — Other Ambulatory Visit: Payer: Self-pay | Admitting: Family Medicine

## 2020-05-24 ENCOUNTER — Ambulatory Visit
Admission: RE | Admit: 2020-05-24 | Discharge: 2020-05-24 | Disposition: A | Discharge status: Home / Self Care | Payer: BC Managed Care – PPO | Source: Ambulatory Visit | Attending: Family Medicine | Admitting: Family Medicine

## 2020-05-24 ENCOUNTER — Other Ambulatory Visit: Payer: Self-pay

## 2020-05-24 DIAGNOSIS — Z1231 Encounter for screening mammogram for malignant neoplasm of breast: Secondary | ICD-10-CM | POA: Diagnosis not present

## 2020-05-25 NOTE — Progress Notes (Signed)
Office Visit    Patient Name: Monica Hull Date of Encounter: 05/27/2020  Primary Care Provider:  Dorcas CarrowJohnson, Megan P, DO Primary Cardiologist:  Julien Nordmannimothy Gollan, MD Electrophysiologist:  None   Chief Complaint    Monica Hull is a 59 y.o. female with a hx of GERD, COPD, coronary artery calcification on CT scan, tobacco use, carotid stenosis, chest pain, diet controlled diabetes, syncope, asthma, CKD,  HTN, HLD presents today for follow up of HTN, carotid artery disease.   Past Medical History    Past Medical History:  Diagnosis Date  . Asthma   . Carpal tunnel syndrome   . Chronic kidney disease    H/O KIDNEY STONES  . COPD (chronic obstructive pulmonary disease) (HCC)   . Diabetes mellitus without complication (HCC)    DIET CONTROLLED  . Elevated liver function tests   . GERD (gastroesophageal reflux disease)   . Headache    MIGRAINES  . Hyperlipidemia   . Hypertension   . Impaired fasting glucose   . Sleep apnea    Not using CPAP- referred to sleep center  . Tobacco abuse    Past Surgical History:  Procedure Laterality Date  . CARPAL TUNNEL RELEASE    . CESAREAN SECTION     X2  . HYSTEROSCOPY WITH D & C N/A 07/11/2015   Procedure: DILATATION AND CURETTAGE /HYSTEROSCOPY;  Surgeon: Hildred LaserAnika Cherry, MD;  Location: [facility] OR[facility];  Service: Gynecology;  Laterality: N/A;  . PALATE / UVULA BIOPSY / EXCISION      Allergies  Allergies  Allergen Reactions  . Advair Hfa [Fluticasone-Salmeterol] Rash    Rash and thrush  . Anoro Ellipta [Umeclidinium-Vilanterol] Rash    Causes patients tongue to break out  . Biaxin [Clarithromycin] Nausea And Vomiting and Rash  . Fluticasone Rash    History of Present Illness    Monica Hull is a 59 y.o. female with a hx of GERD, COPD, tobacco use, coronary artery calcification on CT scan carotid stenosis, chest pain, diet controlled diabetes, syncope, asthma, CKD,  HTN, HLD  last seen 03/24/2018 by Dr.  Mariah MillingGollan.  She has had previous syncope episodes which were presumed vasovagal.  She had previous CT scan in 2017 which showed right-sided carotid stenosis.  She was recommended for carotid duplex.  She was carotid duplex 04/2018 with bilateral nonhemodynamically significant less than 50% plaque in the CCA.  There is also noted anechoic, asvascular area on left thyroid 0.6cmx0.6cm which has been followed with ultrasound.  At last clinic visit 03/14/2018 she was started on Zetia 10 mg daily.   Seen by her primary care 05/02/20 for follow up after COPD exacerbation. She was recommended for another 12 day prednisone taper.  She had lipid panel with total cholesterol 220 and LDL 154. She noted forgetting doses of her Atorvastatin. Dose was increased and changed to 80mg  once per week.   Presents today for follow-up. She finished a second Prednisone taper a few weeks ago. Tells me the Prednisone made her full of energy and have difficulty sleeping. She now feels drained. She sleeps in the bed with 3 pillows which is her baseline.  Denies edema.  Tells me she gets short of breath with minimal activity.  She also notices wheezing.  She is overdue for follow-up with pulmonary.  Tells me she occasionally will get chest tightness with her dyspnea on exertion though overall not bothersome.  We reviewed that her recent CT lung cancer screening showed three-vessel coronary disease.  She tells me her last stress test was a number of years ago.  We discussed tactics for medication compliance including using a pill organizer.  She reports occasional sensation of racing heartbeat when she lays down to go to sleep at night though tells me this self resolves and is overall not bothersome.  Denies lightheadedness, dizziness, near-syncope, syncope.  No formal exercise routine.  EKGs/Labs/Other Studies Reviewed:   The following studies were reviewed today:  CT chest lung cancer screening IMPRESSION: 1. Lung-RADS 2S, benign  appearance or behavior. Continue annual screening with low-dose chest CT without contrast in 12 months. 2. The "S" modifier above refers to potentially clinically significant non lung cancer related findings. Specifically, there is aortic atherosclerosis, in addition to 3 vessel coronary artery disease. Please note that although the presence of coronary artery calcium documents the presence of coronary artery disease, the severity of this disease and any potential stenosis cannot be assessed on this non-gated CT examination. Assessment for potential risk factor modification, dietary therapy or pharmacologic therapy may be warranted, if clinically indicated. 3. There is also ectasia of the ascending thoracic aorta (4.0 cm in diameter). Attention at time of repeat annual low-dose lung cancer screening chest CT is recommended to ensure continued stability. 4. Mild diffuse bronchial wall thickening with mild centrilobular and paraseptal emphysema; imaging findings suggestive of underlying COPD.   Aortic Atherosclerosis (ICD10-I70.0) and Emphysema (ICD10-J43.9).  Carotid duplex 04/2018 Anechoic, avascular area seen in the left thyroid, measuring 0.6cm x  0.6cm. Area has a hyperechoic border    Summary:  Right Carotid: Non-hemodynamically significant plaque <50% noted in the  CCA. The                 ECA appears <50% stenosed. The extracranial vessels were                 near-normal with only minimal wall thickening or plaque.   Left Carotid: Non-hemodynamically significant plaque noted in the CCA. The  ECA               appears <50% stenosed. The extracranial vessels were  near-normal               with only minimal wall thickening or plaque.   Vertebrals:  Bilateral vertebral arteries demonstrate antegrade flow.  Subclavians: Normal flow hemodynamics were seen in bilateral subclavian               arteries.   EKG:  EKG is ordered today.  The ekg ordered today demonstrates NSR 86  bpm with no acute ST/T wave changes.  Recent Labs: 05/02/2020: ALT 42; BUN 8; Creatinine, Ser 0.68; Potassium 4.2; Sodium 141  Recent Lipid Panel    Component Value Date/Time   CHOL 220 (H) 05/02/2020 0914   CHOL 195 05/23/2018 1053   TRIG 120 05/02/2020 0914   TRIG 183 (H) 05/23/2018 1053   HDL 44 05/02/2020 0914   VLDL 37 (H) 05/23/2018 1053   LDLCALC 154 (H) 05/02/2020 0914    Home Medications   Current Meds  Medication Sig  . albuterol (PROVENTIL) (2.5 MG/3ML) 0.083% nebulizer solution Take 3 mLs (2.5 mg total) by nebulization every 6 (six) hours as needed for wheezing or shortness of breath.  Marland Kitchen albuterol (VENTOLIN HFA) 108 (90 Base) MCG/ACT inhaler Inhale 2 puffs into the lungs every 6 (six) hours as needed for wheezing or shortness of breath.  Marland Kitchen atorvastatin (LIPITOR) 80 MG tablet Take 1 tablet (80 mg total) by mouth  once a week.  . budesonide-formoterol (SYMBICORT) 160-4.5 MCG/ACT inhaler Inhale 2 puffs into the lungs 2 (two) times daily.  . cetirizine (ZYRTEC) 10 MG tablet Take 1 tablet (10 mg total) by mouth daily.  . diclofenac (VOLTAREN) 50 MG EC tablet Take 1 tablet (50 mg total) by mouth 4 (four) times daily.  . fesoterodine (TOVIAZ) 8 MG TB24 tablet Take 1 tablet (8 mg total) by mouth daily.  . fluticasone (FLONASE) 50 MCG/ACT nasal spray Place 2 sprays into both nostrils daily.  Marland Kitchen lisinopril (ZESTRIL) 30 MG tablet TAKE 1 TABLET(30 MG) BY MOUTH DAILY  . metFORMIN (GLUCOPHAGE-XR) 500 MG 24 hr tablet Take 1 tablet (500 mg total) by mouth daily with breakfast.  . nystatin (MYCOSTATIN) 100000 UNIT/ML suspension Take 5 mLs (500,000 Units total) by mouth 4 (four) times daily.  . pantoprazole (PROTONIX) 40 MG tablet Take 1 tablet (40 mg total) by mouth every morning.  . predniSONE (DELTASONE) 10 MG tablet 6 tabs today and tomorrow, 5 tabs the next 2 days, decrease by 1 every other day until gone.  Marland Kitchen Spacer/Aero-Holding Chambers (AEROCHAMBER MV) inhaler Use as instructed  .  Tiotropium Bromide Monohydrate (SPIRIVA RESPIMAT) 2.5 MCG/ACT AERS Inhale 2 puffs into the lungs daily.  Marland Kitchen tiZANidine (ZANAFLEX) 4 MG capsule Take 4 mg by mouth 3 (three) times daily as needed for muscle spasms. Reported on 07/11/2015  . triamcinolone cream (KENALOG) 0.1 % Apply 1 application topically 2 (two) times daily.     Review of Systems     Review of Systems  Constitutional: Positive for malaise/fatigue. Negative for chills and fever.  Cardiovascular: Positive for chest pain and dyspnea on exertion. Negative for irregular heartbeat, leg swelling, near-syncope, orthopnea, palpitations and syncope.  Respiratory: Negative for cough, shortness of breath and wheezing.   Gastrointestinal: Negative for melena, nausea and vomiting.  Genitourinary: Negative for hematuria.  Neurological: Negative for dizziness, light-headedness and weakness.   All other systems reviewed and are otherwise negative except as noted above.  Physical Exam    VS:  BP 118/74 (BP Location: Left Arm, Patient Position: Sitting, Cuff Size: Normal)   Pulse 86   Ht 5\' 2"  (1.575 m)   Wt 182 lb (82.6 kg)   SpO2 98%   BMI 33.29 kg/m  , BMI Body mass index is 33.29 kg/m.  Wt Readings from Last 3 Encounters:  05/27/20 182 lb (82.6 kg)  05/02/20 181 lb (82.1 kg)  04/20/20 183 lb (83 kg)    GEN: Well nourished, well developed, in no acute distress. HEENT: normal. Neck: Supple, no JVD, carotid bruits, or masses. Cardiac: RRR, no murmurs, rubs, or gallops. No clubbing, cyanosis, edema.  Radials/DP/PT 2+ and equal bilaterally.  Respiratory:  Respirations regular and unlabored, clear to auscultation bilaterally. GI: Soft, nontender, nondistended. MS: No deformity or atrophy. Skin: Warm and dry, no rash. Neuro:  Strength and sensation are intact. Psych: Normal affect.  Assessment & Plan   1. HTN- BP well controlled. Continue current antihypertensive regimen including lisinopril 40 mg daily.   2. Coronary artery  calcification on CT / DOE -CT lung cancer screening performed 03/2019 is 03/2020 demonstrates three-vessel coronary disease.  She reports increasing dyspnea on exertion associated with occasional chest tightness.  As such, plan for ischemic evaluation with cardiac CTA.  Strong family history of coronary disease and risk factors include hyperlipidemia, DM2, overweight, tobacco use.  Anticipate her DOE is likely multifactorial deconditioning, lung disease, current tobacco use though cannot exclude anginal equivalent in clinic for  ischemic evaluation.  If ischemic evaluation is unremarkable echocardiogram may be of benefit.  3. Carotid artery disease-no amaurosis fugax or symptoms of worsening carotid stenosis.  Continue high intensity statin.  She tells me her aspirin was stopped by another healthcare provider and she is not sure why.  She is hesitant resuming and we will discuss again after CT, as above.  4. DM2-continue to follow with primary care provider.  5. HLD-lipid panel 05/02/2020 with LDL 154.  Her primary care changed her atorvastatin to 80 mg once per week.  Difficulty with medication compliance.  Encouraged to utilize pill Dance movement psychotherapist.  Plan for repeat lipid panel at follow-up.  Anticipate she will need to take this medication more than once per week. Ideally LDL goal <70.  6. Obesity-weight loss via diet and exercise encouraged.  We discussed the role that her weight could plan her coronary disease as well as diabetes.  7. Syncope-previous episodes presumed vasovagal.  Reports no recurrence.  8. Tobacco use -not interested in quitting. Smoking cessation encouraged. Recommend utilization of [id number].  Disposition: Follow up in 2 month(s) with Dr. Mariah Milling or APP  Signed, Alver Sorrow, NP 05/27/2020, 9:30 AM [facility]

## 2020-05-27 ENCOUNTER — Ambulatory Visit (INDEPENDENT_AMBULATORY_CARE_PROVIDER_SITE_OTHER): Payer: BC Managed Care – PPO | Admitting: Family

## 2020-05-27 ENCOUNTER — Encounter: Payer: Self-pay | Admitting: Family

## 2020-05-27 ENCOUNTER — Other Ambulatory Visit: Payer: Self-pay

## 2020-05-27 VITALS — BP 118/74 | HR 86 | Ht 62.0 in | Wt 182.0 lb

## 2020-05-27 DIAGNOSIS — E1165 Type 2 diabetes mellitus with hyperglycemia: Secondary | ICD-10-CM

## 2020-05-27 DIAGNOSIS — R0600 Dyspnea, unspecified: Secondary | ICD-10-CM | POA: Diagnosis not present

## 2020-05-27 DIAGNOSIS — I2510 Atherosclerotic heart disease of native coronary artery without angina pectoris: Secondary | ICD-10-CM | POA: Diagnosis not present

## 2020-05-27 DIAGNOSIS — R072 Precordial pain: Secondary | ICD-10-CM

## 2020-05-27 DIAGNOSIS — E785 Hyperlipidemia, unspecified: Secondary | ICD-10-CM

## 2020-05-27 DIAGNOSIS — I2584 Coronary atherosclerosis due to calcified coronary lesion: Secondary | ICD-10-CM

## 2020-05-27 DIAGNOSIS — R55 Syncope and collapse: Secondary | ICD-10-CM

## 2020-05-27 DIAGNOSIS — Z720 Tobacco use: Secondary | ICD-10-CM

## 2020-05-27 DIAGNOSIS — I10 Essential (primary) hypertension: Secondary | ICD-10-CM

## 2020-05-27 DIAGNOSIS — R0609 Other forms of dyspnea: Secondary | ICD-10-CM

## 2020-05-27 MED ORDER — METOPROLOL TARTRATE 100 MG PO TABS: ORAL_TABLET | ORAL | 0 refills | Status: DC

## 2020-05-27 NOTE — Patient Instructions (Addendum)
Medication Instructions:  Your physician has recommended you make the following change in your medication:   RECOMMEND using your Spiriva twice daily as prescribed. This helps it to work most efficiently.  *If you need a refill on your cardiac medications before your next appointment, please call your pharmacy*   Lab Work: None ordered today.    Testing/Procedures: Your physician has requested that you have cardiac CT. Cardiac computed tomography (CT) is a painless test that uses an x-ray machine to take clear, detailed pictures of your heart.  Your cardiac CT will be scheduled at one of the below locations:   [facility] [address] [place], [place] [address] [phone]  [place] [address] [place], [place] [address] [phone]  If scheduled at [facility], please arrive at the [facility] main entrance of [facility] 30 minutes prior to test start time. Proceed to the [facility] Radiology Department (first floor) to check-in and test prep.  If scheduled at [facility], please arrive 15 mins early for check-in and test prep.  Please follow these instructions carefully (unless otherwise directed):  On the Night Before the Test: . Be sure to Drink plenty of water. . Do not consume any caffeinated/decaffeinated beverages or chocolate 12 hours prior to your test. . Do not take any antihistamines 12 hours prior to your test.   On the Day of the Test: . Drink plenty of water. Do not drink any water within one hour of the test. . Do not eat any food 4 hours prior to the test. . You may take your regular medications prior to the test.  . Take metoprolol (Lopressor) 100 mg two hours prior to test x 1 dose . FEMALES- please wear underwire-free bra if available       After the Test: . Drink plenty of water. . After receiving IV contrast, you may experience a  mild flushed feeling. This is normal. . On occasion, you may experience a mild rash up to 24 hours after the test. This is not dangerous. If this occurs, you can take Benadryl 25 mg and increase your fluid intake. . If you experience trouble breathing, this can be serious. If it is severe call 911 IMMEDIATELY. If it is mild, please call our office. . If you take any of these medications: Glipizide/Metformin, Avandament, Glucavance, please do not take 48 hours after completing test unless otherwise instructed.   Once we have confirmed authorization from your insurance company, we will call you to set up a date and time for your test. Based on how quickly your insurance processes prior authorizations requests, please allow up to 4 weeks to be contacted for scheduling your Cardiac CT appointment. Be advised that routine Cardiac CT appointments could be scheduled as many as 8 weeks after your provider has ordered it.  For non-scheduling related questions, please contact the cardiac imaging nurse navigator should you have any questions/concerns: Marchia Bond, Cardiac Imaging Nurse Navigator Burley Saver, Interim Cardiac Imaging Nurse [place] [facility] Direct Office Dial: [phone]   For scheduling needs, including cancellations and rescheduling, please call [place], [phone].     Follow-Up: At [facility], you and your health needs are our priority.  As part of our continuing mission to provide you with exceptional heart care, we have created designated Provider Care Teams.  These Care Teams include your primary Cardiologist (physician) and Advanced Practice  Providers (APPs -  Physician Assistants and Nurse Practitioners) who all work together to provide you with the care you need, when you need it.  We recommend signing up for the patient portal called "MyChart".  Sign up information is provided on this After Visit Summary.  MyChart is used to connect with  patients for Virtual Visits (Telemedicine).  Patients are able to view lab/test results, encounter notes, upcoming appointments, etc.  Non-urgent messages can be sent to your provider as well.   To learn more about what you can do with MyChart, go to [email or web address].    Your next appointment:   2 month(s)  The format for your next appointment:   In Person  Provider:   You may see Ida Rogue, MD or one of the following Advanced Practice Providers on your designated Care Team:    Murray Hodgkins, NP  Christell Faith, PA-C  Marrianne Mood, PA-C  Cadence [place], [place]  Laurann Montana, NP  Other Instructions  Pursed Lip Breathing Pursed lip breathing is a technique to relieve the feeling of being short of breath. Some long-term respiratory conditions, like chronic obstructive pulmonary disease (COPD) and severe asthma, can make it hard to breathe out (exhale) all of the air in your lungs. This can make air that has less oxygen than normal build up in your lungs (air trapping). Trapped air means your lungs fill with less fresh air when you breathe in (inhale). As a result, you feel short of breath. Pursed lip breathing keeps your airways open longer when you exhale and empties more air from your lungs. This makes more space for fresh air when you inhale. Pursed lip breathing can also slow down your breathing and help your body not have to work so hard to breathe. Over time, pursed lip breathing may help you be able to be more physically active and do more activities. How to perform pursed lip breathing Being short of breath can make you tense and anxious. Before you start this breathing exercise, take a minute to relax your shoulders and close your eyes. Then: 1. Start the exercise by closing your mouth. 2. Breathe in through your nose, taking a normal breath. You can do this at your normal rate of breathing. If you feel you are not getting enough air, breathe in while slowly counting  to 2 or 3. 3. Pucker (purse) your lips as if you were going to whistle. 4. Gently tighten your abdomen muscles or press on your belly to help push the air out. 5. Breathe out slowly through your pursed lips. Take at least twice as long to breathe out as it takes you to breathe in. 6. Make sure that you breathe out all of the air, but do not force air out. 7. Repeat the exercise until your breathing improves. Ask your health care provider how often and how long to do this exercise. Follow these instructions at home:  Take over-the-counter and prescription medicines only as told by your health care provider.  Return to your normal activities as told by your health care provider. Ask your health care provider what activities are safe for you.  Do not use any products that contain nicotine or tobacco, such as cigarettes and e-cigarettes. If you need help quitting, ask your health care provider.  Keep all follow-up visits as told by your health care provider. This is important. Contact a health care provider if:  Your shortness of breath gets worse.  You become less able to exercise  or be active.  You develop a cough.  You develop a fever. Get help right away if:  You are struggling to breathe.  Your shortness of breath prevents you from engaging in any activity. Summary  Pursed lip breathing is a breathing technique that helps to remove trapped air from your lungs. It helps you get more oxygen into your lungs and makes your body have to work less hard to breathe.  Pursed lip breathing can gradually make you more able to be physically active.  You can do pursed lip breathing on your own at home.  Ask your health care provider how often and how long you should do pursed lip breathing. This information is not intended to replace advice given to you by your health care provider. Make sure you discuss any questions you have with your health care provider. Document Revised: 05/24/2017  Document Reviewed: 05/03/2016 Elsevier Patient Education  [place].    Cardiac CT Angiogram A cardiac CT angiogram is a procedure to look at the heart and the area around the heart. It may be done to help find the cause of chest pains or other symptoms of heart disease. During this procedure, a substance called contrast dye is injected into the blood vessels in the area to be checked. A large X-ray machine, called a CT scanner, then takes detailed pictures of the heart and the surrounding area. The procedure is also sometimes called a coronary CT angiogram, coronary artery scanning, or CTA. A cardiac CT angiogram allows the health care provider to see how well blood is flowing to and from the heart. The health care provider will be able to see if there are any problems, such as:  Blockage or narrowing of the coronary arteries in the heart.  Fluid around the heart.  Signs of weakness or disease in the muscles, valves, and tissues of the heart. Tell a health care provider about:  Any allergies you have. This is especially important if you have had a previous allergic reaction to contrast dye.  All medicines you are taking, including vitamins, herbs, eye drops, creams, and over-the-counter medicines.  Any blood disorders you have.  Any surgeries you have had.  Any medical conditions you have.  Whether you are pregnant or may be pregnant.  Any anxiety disorders, chronic pain, or other conditions you have that may increase your stress or prevent you from lying still. What are the risks? Generally, this is a safe procedure. However, problems may occur, including:  Bleeding.  Infection.  Allergic reactions to medicines or dyes.  Damage to other structures or organs.  Kidney damage from the contrast dye that is used.  Increased risk of cancer from radiation exposure. This risk is low. Talk with your health care provider about: ? The risks and benefits of testing. ? How  you can receive the lowest dose of radiation. What happens before the procedure?  Wear comfortable clothing and remove any jewelry, glasses, dentures, and hearing aids.  Follow instructions from your health care provider about eating and drinking. This may include: ? For 12 hours before the procedure -- avoid caffeine. This includes tea, coffee, soda, energy drinks, and diet pills. Drink plenty of water or other fluids that do not have caffeine in them. Being well hydrated can prevent complications. ? For 4-6 hours before the procedure -- stop eating and drinking. The contrast dye can cause nausea, but this is less likely if your stomach is empty.  Ask your health care  provider about changing or stopping your regular medicines. This is especially important if you are taking diabetes medicines, blood thinners, or medicines to treat problems with erections (erectile dysfunction). What happens during the procedure?   Hair on your chest may need to be removed so that small sticky patches called electrodes can be placed on your chest. These will transmit information that helps to monitor your heart during the procedure.  An IV will be inserted into one of your veins.  You might be given a medicine to control your heart rate during the procedure. This will help to ensure that good images are obtained.  You will be asked to lie on an exam table. This table will slide in and out of the CT machine during the procedure.  Contrast dye will be injected into the IV. You might feel warm, or you may get a metallic taste in your mouth.  You will be given a medicine called nitroglycerin. This will relax or dilate the arteries in your heart.  The table that you are lying on will move into the CT machine tunnel for the scan.  The person running the machine will give you instructions while the scans are being done. You may be asked to: ? Keep your arms above your head. ? Hold your breath. ? Stay very  still, even if the table is moving.  When the scanning is complete, you will be moved out of the machine.  The IV will be removed. The procedure may vary among health care providers and hospitals. What can I expect after the procedure? After your procedure, it is common to have:  A metallic taste in your mouth from the contrast dye.  A feeling of warmth.  A headache from the nitroglycerin. Follow these instructions at home:  Take over-the-counter and prescription medicines only as told by your health care provider.  If you are told, drink enough fluid to keep your urine pale yellow. This will help to flush the contrast dye out of your body.  Most people can return to their normal activities right after the procedure. Ask your health care provider what activities are safe for you.  It is up to you to get the results of your procedure. Ask your health care provider, or the department that is doing the procedure, when your results will be ready.  Keep all follow-up visits as told by your health care provider. This is important. Contact a health care provider if:  You have any symptoms of allergy to the contrast dye. These include: ? Shortness of breath. ? Rash or hives. ? A racing heartbeat. Summary  A cardiac CT angiogram is a procedure to look at the heart and the area around the heart. It may be done to help find the cause of chest pains or other symptoms of heart disease.  During this procedure, a large X-ray machine, called a CT scanner, takes detailed pictures of the heart and the surrounding area after a contrast dye has been injected into blood vessels in the area.  Ask your health care provider about changing or stopping your regular medicines before the procedure. This is especially important if you are taking diabetes medicines, blood thinners, or medicines to treat erectile dysfunction.  If you are told, drink enough fluid to keep your urine pale yellow. This will help  to flush the contrast dye out of your body. This information is not intended to replace advice given to you by your health care provider. Make  sure you discuss any questions you have with your health care provider. Document Revised: 02/04/2019 Document Reviewed: 02/04/2019 Elsevier Patient Education  [place].

## 2020-05-30 ENCOUNTER — Other Ambulatory Visit: Payer: Self-pay | Admitting: Family Medicine

## 2020-05-30 DIAGNOSIS — R928 Other abnormal and inconclusive findings on diagnostic imaging of breast: Secondary | ICD-10-CM

## 2020-05-31 ENCOUNTER — Other Ambulatory Visit: Payer: Self-pay

## 2020-05-31 ENCOUNTER — Ambulatory Visit (INDEPENDENT_AMBULATORY_CARE_PROVIDER_SITE_OTHER): Payer: BC Managed Care – PPO | Admitting: Family Medicine

## 2020-05-31 ENCOUNTER — Encounter: Payer: Self-pay | Admitting: Family Medicine

## 2020-05-31 VITALS — BP 141/91 | HR 87 | Temp 98.7°F | Wt 180.6 lb

## 2020-05-31 DIAGNOSIS — J441 Chronic obstructive pulmonary disease with (acute) exacerbation: Secondary | ICD-10-CM | POA: Diagnosis not present

## 2020-05-31 MED ORDER — ALBUTEROL SULFATE (2.5 MG/3ML) 0.083% IN NEBU
2.5000 mg | INHALATION_SOLUTION | Freq: Once | RESPIRATORY_TRACT | Status: AC
  Administered 2020-05-31: 2.5 mg via RESPIRATORY_TRACT

## 2020-05-31 NOTE — Progress Notes (Signed)
BP (!) 141/91   Pulse 87   Temp 98.7 F (37.1 C)   Wt 180 lb 9.6 oz (81.9 kg)   SpO2 98%   BMI 33.03 kg/m    Subjective:    Patient ID: Monica Hull, female    DOB: 12-14-1960, 59 y.o.   MRN: [id number]  HPI: Monica Hull is a 59 y.o. female  Chief Complaint  Patient presents with  . COPD   Feeling much better. Still really tired. She notes that she has finished her antibiotics. She is still coughing. Still feeling short of breath, but significantly better than she was. She is only using her nebulizer 1x a week. She has been using her inhalers. No other concerns or complaints at this time.   Relevant past medical, surgical, family and social history reviewed and updated as indicated. Interim medical history since our last visit reviewed. Allergies and medications reviewed and updated.  Review of Systems  Constitutional: Negative.   Respiratory: Positive for cough, chest tightness, shortness of breath and wheezing. Negative for apnea, choking and stridor.   Cardiovascular: Negative.   Gastrointestinal: Negative.   Neurological: Negative.   Psychiatric/Behavioral: Negative.     Per HPI unless specifically indicated above     Objective:    BP (!) 141/91   Pulse 87   Temp 98.7 F (37.1 C)   Wt 180 lb 9.6 oz (81.9 kg)   SpO2 98%   BMI 33.03 kg/m   Wt Readings from Last 3 Encounters:  05/31/20 180 lb 9.6 oz (81.9 kg)  05/27/20 182 lb (82.6 kg)  05/02/20 181 lb (82.1 kg)    Physical Exam Vitals and nursing note reviewed.  Constitutional:      General: She is not in acute distress.    Appearance: Normal appearance. She is not ill-appearing, toxic-appearing or diaphoretic.  HENT:     Head: Normocephalic and atraumatic.     Right Ear: External ear normal.     Left Ear: External ear normal.     Nose: Nose normal.     Mouth/Throat:     Mouth: Mucous membranes are moist.     Pharynx: Oropharynx is clear.  Eyes:     General: No scleral  icterus.       Right eye: No discharge.        Left eye: No discharge.     Extraocular Movements: Extraocular movements intact.     Conjunctiva/sclera: Conjunctivae normal.     Pupils: Pupils are equal, round, and reactive to light.  Cardiovascular:     Rate and Rhythm: Normal rate and regular rhythm.     Pulses: Normal pulses.     Heart sounds: Normal heart sounds. No murmur heard.  No friction rub. No gallop.   Pulmonary:     Effort: Pulmonary effort is normal. No respiratory distress.     Breath sounds: No stridor. Wheezing and rhonchi present. No rales.  Chest:     Chest wall: No tenderness.  Musculoskeletal:        General: Normal range of motion.     Cervical back: Normal range of motion and neck supple.  Skin:    General: Skin is warm and dry.     Capillary Refill: Capillary refill takes less than 2 seconds.     Coloration: Skin is not jaundiced or pale.     Findings: No bruising, erythema, lesion or rash.  Neurological:     General: No focal deficit present.  Mental Status: She is alert and oriented to person, place, and time. Mental status is at baseline.  Psychiatric:        Mood and Affect: Mood normal.        Behavior: Behavior normal.        Thought Content: Thought content normal.        Judgment: Judgment normal.     Results for orders placed or performed in visit on 05/02/20  Bayer DCA Hb A1c Waived  Result Value Ref Range   HB A1C (BAYER DCA - WAIVED) 6.9 <7.0 %  Comprehensive metabolic panel  Result Value Ref Range   Glucose 154 (H) 65 - 99 mg/dL   BUN 8 8 - 27 mg/dL   Creatinine, Ser 8.58 0.57 - 1.00 mg/dL   GFR calc non Af Amer 95 >59 mL/min/1.73   GFR calc Af Amer 110 >59 mL/min/1.73   BUN/Creatinine Ratio 12 12 - 28   Sodium 141 134 - 144 mmol/L   Potassium 4.2 3.5 - 5.2 mmol/L   Chloride 105 96 - 106 mmol/L   CO2 22 20 - 29 mmol/L   Calcium 9.6 8.7 - 10.3 mg/dL   Total Protein 7.1 6.0 - 8.5 g/dL   Albumin 4.3 3.8 - 4.9 g/dL   Globulin,  Total 2.8 1.5 - 4.5 g/dL   Albumin/Globulin Ratio 1.5 1.2 - 2.2   Bilirubin Total 0.7 0.0 - 1.2 mg/dL   Alkaline Phosphatase 137 (H) 44 - 121 IU/L   AST 34 0 - 40 IU/L   ALT 42 (H) 0 - 32 IU/L  Lipid Panel w/o Chol/HDL Ratio  Result Value Ref Range   Cholesterol, Total 220 (H) 100 - 199 mg/dL   Triglycerides 850 0 - 149 mg/dL   HDL 44 >27 mg/dL   VLDL Cholesterol Cal 22 5 - 40 mg/dL   LDL Chol Calc (NIH) 741 (H) 0 - 99 mg/dL  Microalbumin, Urine Waived  Result Value Ref Range   Microalb, Ur Waived 10 0 - 19 mg/L   Creatinine, Urine Waived 100 10 - 300 mg/dL   Microalb/Creat Ratio <30 <30 mg/g      Assessment & Plan:   Problem List Items Addressed This Visit      Respiratory   COPD (chronic obstructive pulmonary disease) (HCC) - Primary    Still wheezy. Encouraged inhalers and nebulizers. Continue to follow with pulmonology. Call with any concerns.           Follow up plan: Return if symptoms worsen or fail to improve.

## 2020-05-31 NOTE — Assessment & Plan Note (Signed)
Still wheezy. Encouraged inhalers and nebulizers. Continue to follow with pulmonology. Call with any concerns.

## 2020-06-04 ENCOUNTER — Other Ambulatory Visit: Payer: Self-pay

## 2020-06-04 ENCOUNTER — Ambulatory Visit
Admission: RE | Admit: 2020-06-04 | Discharge: 2020-06-04 | Disposition: A | Discharge status: Home / Self Care | Payer: BC Managed Care – PPO | Source: Ambulatory Visit | Attending: Family Medicine | Admitting: Family Medicine

## 2020-06-04 DIAGNOSIS — R928 Other abnormal and inconclusive findings on diagnostic imaging of breast: Secondary | ICD-10-CM

## 2020-06-04 DIAGNOSIS — R921 Mammographic calcification found on diagnostic imaging of breast: Secondary | ICD-10-CM | POA: Diagnosis not present

## 2020-06-14 ENCOUNTER — Other Ambulatory Visit (HOSPITAL_COMMUNITY): Payer: Self-pay | Admitting: *Deleted

## 2020-06-14 DIAGNOSIS — R072 Precordial pain: Secondary | ICD-10-CM

## 2020-06-15 ENCOUNTER — Other Ambulatory Visit: Payer: Self-pay

## 2020-06-15 ENCOUNTER — Other Ambulatory Visit
Admission: RE | Admit: 2020-06-15 | Discharge: 2020-06-15 | Disposition: A | Discharge status: Home / Self Care | Payer: BC Managed Care – PPO | Attending: Cardiology | Admitting: Cardiology

## 2020-06-15 DIAGNOSIS — R072 Precordial pain: Secondary | ICD-10-CM | POA: Insufficient documentation

## 2020-06-15 LAB — BASIC METABOLIC PANEL
Anion gap: 10 (ref 5–15)
BUN: 9 mg/dL (ref 6–20)
CO2: 23 mmol/L (ref 22–32)
Calcium: 9.3 mg/dL (ref 8.9–10.3)
Chloride: 104 mmol/L (ref 98–111)
Creatinine, Ser: 0.59 mg/dL (ref 0.44–1.00)
GFR, Estimated: >60 mL/min (ref >60)
Glucose, Bld: 234 mg/dL — ABNORMAL HIGH (ref 70–99)
Potassium: 3.9 mmol/L (ref 3.5–5.1)
Sodium: 137 mmol/L (ref 135–145)

## 2020-06-16 ENCOUNTER — Telehealth: Payer: Self-pay | Admitting: *Deleted

## 2020-06-16 ENCOUNTER — Ambulatory Visit
Admission: RE | Admit: 2020-06-16 | Discharge: 2020-06-16 | Disposition: A | Discharge status: Home / Self Care | Payer: BC Managed Care – PPO | Source: Ambulatory Visit | Attending: Family | Admitting: Family

## 2020-06-16 ENCOUNTER — Other Ambulatory Visit: Payer: Self-pay

## 2020-06-16 DIAGNOSIS — R0600 Dyspnea, unspecified: Secondary | ICD-10-CM | POA: Diagnosis not present

## 2020-06-16 DIAGNOSIS — R072 Precordial pain: Secondary | ICD-10-CM

## 2020-06-16 DIAGNOSIS — R0609 Other forms of dyspnea: Secondary | ICD-10-CM

## 2020-06-16 MED ORDER — NITROGLYCERIN 0.4 MG SL SUBL
0.8000 mg | SUBLINGUAL_TABLET | Freq: Once | SUBLINGUAL | Status: AC
  Administered 2020-06-16: 13:00:00 0.8 mg via SUBLINGUAL

## 2020-06-16 MED ORDER — IOHEXOL 350 MG/ML SOLN
90.0000 mL | Freq: Once | INTRAVENOUS | Status: AC | PRN
  Administered 2020-06-16: 13:00:00 90 mL via INTRAVENOUS

## 2020-06-16 NOTE — Telephone Encounter (Signed)
Spoke to pt, notified of results of cardiac CT. Pt verbalized understanding. She will start Aspirin EC 81mg  daily. Med list updated. Pt has no further questions at this time.

## 2020-06-16 NOTE — Telephone Encounter (Signed)
-----   Message from Alver Sorrow, NP sent at 06/16/2020  4:17 PM EST ----- Cardiac CTA with moderate stenosis in one of her heart arteries (called the LAD) also with minimal calcifications in the RCA and LCx. Recommend medical management - recommend adding Aspirin EC 81 mg daily. We will update further with plan of care once the FFR has resulted - this is additional test on the images to assess the blood flow that will let us know how to best proceed.

## 2020-06-16 NOTE — Progress Notes (Signed)
Patient tolerated CT well. Drinking water sitting in chair. Vital signs stable encourage to drink water throughout day.Reasons explained and verbalized understanding. Ambulated steady gait.   

## 2020-06-17 DIAGNOSIS — R072 Precordial pain: Secondary | ICD-10-CM | POA: Diagnosis not present

## 2020-06-20 ENCOUNTER — Telehealth: Payer: Self-pay | Admitting: *Deleted

## 2020-06-20 DIAGNOSIS — I2584 Coronary atherosclerosis due to calcified coronary lesion: Secondary | ICD-10-CM

## 2020-06-20 DIAGNOSIS — E782 Mixed hyperlipidemia: Secondary | ICD-10-CM

## 2020-06-20 DIAGNOSIS — Z79899 Other long term (current) drug therapy: Secondary | ICD-10-CM

## 2020-06-20 NOTE — Telephone Encounter (Signed)
-----   Message from Alver Sorrow, NP sent at 06/20/2020 12:39 PM EST ----- Cardiac CTA shows moderate nonobstructive coronary disease. FFR shows no significant stenosis which is a good result. Recommend medical management. Continue Aspirin 81mg  daily. She is presently taking Atorvastatin 80mg  once per week, please [facility] within 1-2 weeks for fasting lipid panel/CMP. Most recent LDL was 154 and goal is <70. Further medical management to be addressed at February appt.

## 2020-06-20 NOTE — Telephone Encounter (Signed)
Spoke to pt, notified of results and recc below. Pt did confirm that she is still taking Atorvastatin 80mg  once per week.  Pt verbalized understanding of instructions: she will have FASTING lab work in 1-2 weeks at the [facility] for lipid/CMP.  Orders placed. Pt has no questions at this time.

## 2020-06-29 ENCOUNTER — Encounter: Payer: Self-pay | Admitting: Family Medicine

## 2020-07-04 ENCOUNTER — Other Ambulatory Visit: Payer: Self-pay | Admitting: Family Medicine

## 2020-07-05 ENCOUNTER — Telehealth: Payer: BC Managed Care – PPO | Admitting: Family Medicine

## 2020-07-05 NOTE — Progress Notes (Deleted)
Virtual Visit via Video Note  I connected with Monica Hull on 07/05/20 at  9:00 AM EST by a video enabled telemedicine application and verified that I am speaking with the correct person using two identifiers.  Location: Patient: *** Provider: ***   I discussed the limitations of evaluation and management by telemedicine and the availability of in person appointments. The patient expressed understanding and agreed to proceed.  History of Present Illness:  UPPER RESPIRATORY TRACT INFECTION Worst symptom: Fever: {Blank single:19197::"yes","no"} Cough: {Blank single:19197::"yes","no"} Shortness of breath: {Blank single:19197::"yes","no"} Wheezing: {Blank single:19197::"yes","no"} Chest pain: {Blank single:19197::"yes","no","yes, with cough"} Chest tightness: {Blank single:19197::"yes","no"} Chest congestion: {Blank single:19197::"yes","no"} Nasal congestion: {Blank single:19197::"yes","no"} Runny nose: {Blank single:19197::"yes","no"} Post nasal drip: {Blank single:19197::"yes","no"} Sneezing: {Blank single:19197::"yes","no"} Sore throat: {Blank single:19197::"yes","no"} Swollen glands: {Blank single:19197::"yes","no"} Sinus pressure: {Blank single:19197::"yes","no"} Headache: {Blank single:19197::"yes","no"} Face pain: {Blank single:19197::"yes","no"} Toothache: {Blank single:19197::"yes","no"} Ear pain: {Blank single:19197::"yes","no"} {Blank single:19197::""right","left","bilateral"} Ear pressure: {Blank single:19197::"yes","no"} {Blank single:19197::""right","left","bilateral"} Eyes red/itching:{Blank single:19197::"yes","no"} Eye drainage/crusting: {Blank single:19197::"yes","no"}  Vomiting: {Blank single:19197::"yes","no"} Rash: {Blank single:19197::"yes","no"} Fatigue: {Blank single:19197::"yes","no"} Sick contacts: {Blank single:19197::"yes","no"} Strep contacts: {Blank single:19197::"yes","no"}  Context: {Blank  multiple:19196::"better","worse","stable","fluctuating"} Recurrent sinusitis: {Blank single:19197::"yes","no"} Relief with OTC cold/cough medications: {Blank single:19197::"yes","no"}  Treatments attempted: {Blank multiple:19196::"none","cold/sinus","mucinex","anti-histamine","pseudoephedrine","cough syrup","antibiotics"}     Observations/Objective:   Assessment and Plan:   Follow Up Instructions:    I discussed the assessment and treatment plan with the patient. The patient was provided an opportunity to ask questions and all were answered. The patient agreed with the plan and demonstrated an understanding of the instructions.   The patient was advised to call back or seek an in-person evaluation if the symptoms worsen or if the condition fails to improve as anticipated.  I provided *** minutes of non-face-to-face time during this encounter.   Caro Laroche, DO

## 2020-07-18 ENCOUNTER — Other Ambulatory Visit
Admission: RE | Admit: 2020-07-18 | Discharge: 2020-07-18 | Disposition: A | Discharge status: Home / Self Care | Payer: BC Managed Care – PPO | Attending: Family | Admitting: Family

## 2020-07-18 ENCOUNTER — Other Ambulatory Visit: Payer: Self-pay

## 2020-07-18 DIAGNOSIS — I2510 Atherosclerotic heart disease of native coronary artery without angina pectoris: Secondary | ICD-10-CM | POA: Diagnosis not present

## 2020-07-18 DIAGNOSIS — H5203 Hypermetropia, bilateral: Secondary | ICD-10-CM | POA: Diagnosis not present

## 2020-07-18 DIAGNOSIS — E782 Mixed hyperlipidemia: Secondary | ICD-10-CM | POA: Diagnosis not present

## 2020-07-18 DIAGNOSIS — Z79899 Other long term (current) drug therapy: Secondary | ICD-10-CM | POA: Insufficient documentation

## 2020-07-18 DIAGNOSIS — I2584 Coronary atherosclerosis due to calcified coronary lesion: Secondary | ICD-10-CM | POA: Insufficient documentation

## 2020-07-18 LAB — COMPREHENSIVE METABOLIC PANEL
ALT: 42 U/L (ref 0–44)
AST: 35 U/L (ref 15–41)
Albumin: 3.5 g/dL (ref 3.5–5.0)
Alkaline Phosphatase: 102 U/L (ref 38–126)
Anion gap: 9 (ref 5–15)
BUN: 8 mg/dL (ref 6–20)
CO2: 27 mmol/L (ref 22–32)
Calcium: 8.9 mg/dL (ref 8.9–10.3)
Chloride: 104 mmol/L (ref 98–111)
Creatinine, Ser: 0.66 mg/dL (ref 0.44–1.00)
GFR, Estimated: >60 mL/min (ref >60)
Glucose, Bld: 287 mg/dL — ABNORMAL HIGH (ref 70–99)
Potassium: 3.8 mmol/L (ref 3.5–5.1)
Sodium: 140 mmol/L (ref 135–145)
Total Bilirubin: 0.9 mg/dL (ref 0.3–1.2)
Total Protein: 7.3 g/dL (ref 6.5–8.1)

## 2020-07-18 LAB — LIPID PANEL
Cholesterol: 195 mg/dL (ref 0–200)
HDL: 44 mg/dL (ref >40)
LDL Cholesterol: 120 mg/dL — ABNORMAL HIGH (ref 0–99)
Total CHOL/HDL Ratio: 4.4 RATIO
Triglycerides: 155 mg/dL — ABNORMAL HIGH (ref <150)
VLDL: 31 mg/dL (ref 0–40)

## 2020-07-18 LAB — HM DIABETES EYE EXAM

## 2020-07-19 ENCOUNTER — Telehealth: Payer: Self-pay | Admitting: *Deleted

## 2020-07-19 DIAGNOSIS — E782 Mixed hyperlipidemia: Secondary | ICD-10-CM

## 2020-07-19 MED ORDER — ATORVASTATIN CALCIUM 80 MG PO TABS: 80.0000 mg | ORAL_TABLET | Freq: Every day | ORAL | 3 refills | Status: DC

## 2020-07-19 NOTE — Telephone Encounter (Signed)
-----   Message from Alver Sorrow, NP sent at 07/18/2020  1:07 PM EST ----- Normal kidney function, liver function, electrolytes. LDL not at goal of less than 70. She is presently only taking Atorvastatin 80mg  once per week. Recommend increasing to daily and repeat lipid panel in 6-8 weeks at the [facility].

## 2020-07-19 NOTE — Telephone Encounter (Signed)
Spoke to pt, notified of results and recc below. Pt verbalized understanding, she will: Change from Atorvastatin 80mg  weekly to daily and [facility]  New Rx sent to pt's pharmacy and lab orders placed.

## 2020-07-24 NOTE — Progress Notes (Addendum)
Cardiology Office Note  Date:  07/25/2020   ID:  Monica Hull, DOB Apr 02, 1961, MRN [id number]  PCP:  Dorcas Carrow, DO   Chief Complaint  Patient presents with  . Follow-up    2 Months follow up and review CT results. Medications verbally reviewed with patient.     HPI:  Monica Hull is a 60 year-old woman with history of smoking,  GERD,  COPD, smokes 1- 1 1/2 ppd hypertension,  Smoking 1 ppd heavy carotid calcification on the right Previous episodes of chest pain and syncope dating back to 2000  Stress test 2011, no ischemia who presents for symptoms of chest pain and syncope.  Recent studies reviewed with her CT chest 1. Coronary calcium score of 249. This was 95th percentile for age and sex matched control. 2. Normal coronary origin with right dominance. 3. Calcified and non calcified plaque in the mid LAD causing moderate stenosis (50%). 4. Minimal calcifications (<25%) in the mid RCA and LCx arteries  In the past was on a Taking lipitor 1x a week Not taking it daily  BP well controlled Hand cramps, chronic issue  No regular exercise program Long history concerning her smoking, still smokes 1.5 packs/day  No recent syncope Prior episodes near syncope when aggravated, will get nauseous, lightheaded  No recent hospitalizations for COPD exacerbations Has had prior spells   CT scan of the head and neck  Report details thyroid nodule, sinus disease, mastoid disease  heavy carotid calcification on the right No significant atherosclerosis in the aortic arch  On discussion concerning her smoking  tried Wellbutrin, uncertain if she tried Chantix, tried nicotine patches She may try vapors   heavy snorer No previous sleep study  Other past medical history rare episodes of syncope dating back to 2000 Symptoms typically present with nausea of uncertain etiology, followed by lightheadedness and syncope. Suspected to be vasovagal   typically passes  out for 5-10 minutes at a time. Lips go blue, she is limp. Eventually she comes to.   PMH:   has a past medical history of Asthma, Carpal tunnel syndrome, Chronic kidney disease, COPD (chronic obstructive pulmonary disease) (HCC), Diabetes mellitus without complication (HCC), Elevated liver function tests, GERD (gastroesophageal reflux disease), Headache, Hyperlipidemia, Hypertension, Impaired fasting glucose, Sleep apnea, and Tobacco abuse.  PSH:    Past Surgical History:  Procedure Laterality Date  . CARPAL TUNNEL RELEASE    . CESAREAN SECTION     X2  . HYSTEROSCOPY WITH D & C N/A 07/11/2015   Procedure: DILATATION AND CURETTAGE /HYSTEROSCOPY;  Surgeon: Hildred Laser, MD;  Location: [facility] ORS;  Service: Gynecology;  Laterality: N/A;  . PALATE / UVULA BIOPSY / EXCISION      Current Outpatient Medications  Medication Sig Dispense Refill  . albuterol (PROVENTIL) (2.5 MG/3ML) 0.083% nebulizer solution Take 3 mLs (2.5 mg total) by nebulization every 6 (six) hours as needed for wheezing or shortness of breath. 150 mL 1  . albuterol (VENTOLIN HFA) 108 (90 Base) MCG/ACT inhaler Inhale 2 puffs into the lungs every 6 (six) hours as needed for wheezing or shortness of breath. 18 g 6  . aspirin EC 81 MG tablet Take 81 mg by mouth daily. Swallow whole.    Marland Kitchen atorvastatin (LIPITOR) 80 MG tablet Take 1 tablet (80 mg total) by mouth daily. 90 tablet 3  . budesonide-formoterol (SYMBICORT) 160-4.5 MCG/ACT inhaler Inhale 2 puffs into the lungs 2 (two) times daily. 3 Inhaler 3  . cetirizine (ZYRTEC) 10 MG  tablet Take 1 tablet (10 mg total) by mouth daily. 90 tablet 4  . diclofenac (VOLTAREN) 50 MG EC tablet Take 1 tablet (50 mg total) by mouth 4 (four) times daily. 120 tablet 6  . fesoterodine (TOVIAZ) 8 MG TB24 tablet Take 1 tablet (8 mg total) by mouth daily. 90 tablet 3  . fluticasone (FLONASE) 50 MCG/ACT nasal spray Place 2 sprays into both nostrils daily. 16 g 11  . lisinopril (ZESTRIL) 30 MG tablet TAKE 1  TABLET(30 MG) BY MOUTH DAILY 30 tablet 0  . metFORMIN (GLUCOPHAGE-XR) 500 MG 24 hr tablet Take 1 tablet (500 mg total) by mouth daily with breakfast. 90 tablet 1  . metoprolol tartrate (LOPRESSOR) 100 MG tablet Take 1 tablet (100 mg) by mouth 2 hours prior to your Cardiac CT 1 tablet 0  . nystatin (MYCOSTATIN) 100000 UNIT/ML suspension Take 5 mLs (500,000 Units total) by mouth 4 (four) times daily. 60 mL 0  . pantoprazole (PROTONIX) 40 MG tablet Take 1 tablet (40 mg total) by mouth every morning. 90 tablet 1  . Spacer/Aero-Holding Chambers (AEROCHAMBER MV) inhaler Use as instructed 1 each 0  . Tiotropium Bromide Monohydrate (SPIRIVA RESPIMAT) 2.5 MCG/ACT AERS Inhale 2 puffs into the lungs daily. 12 g 3  . tiZANidine (ZANAFLEX) 4 MG capsule Take 4 mg by mouth 3 (three) times daily as needed for muscle spasms. Reported on 07/11/2015    . triamcinolone cream (KENALOG) 0.1 % Apply 1 application topically 2 (two) times daily. 30 g 0   No current facility-administered medications for this visit.     Allergies:   Advair hfa [fluticasone-salmeterol], Anoro ellipta [umeclidinium-vilanterol], Biaxin [clarithromycin], and Fluticasone   Social History:  The patient  reports that she has been smoking cigarettes. She has a 58.50 pack-year smoking history. She has never used smokeless tobacco. She reports that she does not drink alcohol and does not use drugs.   Family History:   family history includes Alcohol abuse in her father; Arthritis in her mother; Asthma in her mother; Breast cancer (age of onset: 68) in her maternal aunt; Breast cancer (age of onset: 65) in her maternal aunt; Diabetes in her brother and mother; Heart disease in her mother; Hyperlipidemia in her father and mother; Hypertension in her father and mother; Kidney disease in her mother; Lung disease in her mother; Pancreatic cancer in her mother.    Review of Systems: Review of Systems  Constitutional: Negative.   Respiratory: Positive  for shortness of breath.   Cardiovascular: Negative.   Gastrointestinal: Negative.   Musculoskeletal: Negative.   Neurological: Negative.   Psychiatric/Behavioral: Negative.   All other systems reviewed and are negative.   PHYSICAL EXAM: VS:  BP 130/80 (BP Location: Left Arm, Patient Position: Sitting, Cuff Size: Normal)   Pulse 92   Ht 5\' 2"  (1.575 m)   Wt 182 lb (82.6 kg)   SpO2 96%   BMI 33.29 kg/m  , BMI Body mass index is 33.29 kg/m. Constitutional:  oriented to person, place, and time. No distress.  HENT:  Head: Grossly normal Eyes:  no discharge. No scleral icterus.  Neck: No JVD, no carotid bruits  Cardiovascular: Regular rate and rhythm, no murmurs appreciated Pulmonary/Chest: Coarse breath sounds bilaterally Abdominal: Soft.  no distension.  no tenderness.  Musculoskeletal: Normal range of motion Neurological:  normal muscle tone. Coordination normal. No atrophy Skin: Skin warm and dry Psychiatric: normal affect, pleasant   Recent Labs: 07/18/2020: ALT 42; BUN 8; Creatinine, Ser 0.66; Potassium 3.8;  Sodium 140    Lipid Panel Lab Results  Component Value Date   CHOL 195 07/18/2020   HDL 44 07/18/2020   LDLCALC 120 (H) 07/18/2020   TRIG 155 (H) 07/18/2020      Wt Readings from Last 3 Encounters:  07/25/20 182 lb (82.6 kg)  05/31/20 180 lb 9.6 oz (81.9 kg)  05/27/20 182 lb (82.6 kg)       ASSESSMENT AND PLAN:  Essential hypertension  Blood pressure is well controlled on today's visit. No changes made to the medications.  Carotid stenosis, right CT scan images from 2017  Tobacco cessation recommended Recommend she take Lipitor daily Last ultrasound November 2019, less than 50% bilaterally  emphysema (HCC) Smoking cessation recommended Followed by pulmonary,  Long discussion with her, she would like to try vapors Discussed other modalities as well  Diet-controlled diabetes mellitus (HCC) Unable to exercise Chronic shortness of breath  limiting activities  Obesity (BMI 30.0-34.9) We have encouraged continued exercise, careful diet management in an effort to lose weight.  Chest pain, unspecified chest pain type No anginal symptoms, no further testing Smoking cessation recommended  Hyperlipidemia Prior noncompliance with Lipitor, now she is taking this daily No need to monitor her cramping   Total encounter time more than 25 minutes  Greater than 50% was spent in counseling and coordination of care with the patient   Disposition:   F/U  12 months   No orders of the defined types were placed in this encounter.    Signed, Dossie Arbour, M.D., Ph.D. 07/25/2020  [facility] [place], [place] [phone]  he

## 2020-07-25 ENCOUNTER — Other Ambulatory Visit: Payer: Self-pay

## 2020-07-25 ENCOUNTER — Encounter: Payer: Self-pay | Admitting: Cardiovascular Disease

## 2020-07-25 ENCOUNTER — Ambulatory Visit: Payer: BC Managed Care – PPO | Admitting: Cardiovascular Disease

## 2020-07-25 VITALS — BP 130/80 | HR 92 | Ht 62.0 in | Wt 182.0 lb

## 2020-07-25 DIAGNOSIS — I2584 Coronary atherosclerosis due to calcified coronary lesion: Secondary | ICD-10-CM

## 2020-07-25 DIAGNOSIS — I700 Atherosclerosis of aorta: Secondary | ICD-10-CM

## 2020-07-25 DIAGNOSIS — E1165 Type 2 diabetes mellitus with hyperglycemia: Secondary | ICD-10-CM | POA: Diagnosis not present

## 2020-07-25 DIAGNOSIS — R0600 Dyspnea, unspecified: Secondary | ICD-10-CM

## 2020-07-25 DIAGNOSIS — I2510 Atherosclerotic heart disease of native coronary artery without angina pectoris: Secondary | ICD-10-CM | POA: Diagnosis not present

## 2020-07-25 DIAGNOSIS — R0609 Other forms of dyspnea: Secondary | ICD-10-CM

## 2020-07-25 MED ORDER — LISINOPRIL 30 MG PO TABS: 30.0000 mg | ORAL_TABLET | Freq: Every day | ORAL | 3 refills | Status: DC

## 2020-07-25 NOTE — Patient Instructions (Addendum)
Work on the smoking cessation  Please consider Vaps or chantix  Medication Instructions:  No changes  Try magnesium and CoQ10 for hand cramps (can get at local drug stores) Tonic water can also help   Lab work: No new labs needed  Testing/Procedures: No new testing needed   Follow-Up: At [facility], you and your health needs are our priority.  As part of our continuing mission to provide you with exceptional heart care, we have created designated Provider Care Teams.  These Care Teams include your primary Cardiologist (physician) and Advanced Practice Providers (APPs -  Physician Assistants and Nurse Practitioners) who all work together to provide you with the care you need, when you need it.  . You will need a follow up appointment in 12 months  . Providers on your designated Care Team:   . Nicolasa Ducking, NP . Eula Listen, PA-C . Marisue Ivan, PA-C  Any Other Special Instructions Will Be Listed Below (If Applicable).  COVID-19 Vaccine Information can be found at: [email or web address] For questions related to vaccine distribution or appointments, please email [email or web address] or call [phone].     Managing the Challenge of Quitting Smoking Quitting smoking is a physical and mental challenge. You will face cravings, withdrawal symptoms, and temptation. Before quitting, work with your health care provider to make a plan that can help you manage quitting. Preparation can help you quit and keep you from giving in. How to manage lifestyle changes Managing stress Stress can make you want to smoke, and wanting to smoke may cause stress. It is important to find ways to manage your stress. You might try some of the following:  Practice relaxation techniques. ? Breathe slowly and deeply, in through your nose and out through your mouth. ? Listen to music. ? Soak in a bath or take a shower. ? Imagine a  peaceful place or vacation.  Get some support. ? Talk with family or friends about your stress. ? Join a support group. ? Talk with a counselor or therapist.  Get some physical activity. ? Go for a walk, run, or bike ride. ? Play a favorite sport. ? Practice yoga.   Medicines Talk with your health care provider about medicines that might help you deal with cravings and make quitting easier for you. Relationships Social situations can be difficult when you are quitting smoking. To manage this, you can:  Avoid parties and other social situations where people might be smoking.  Avoid alcohol.  Leave right away if you have the urge to smoke.  Explain to your family and friends that you are quitting smoking. Ask for support and let them know you might be a bit grumpy.  Plan activities where smoking is not an option. General instructions Be aware that many people gain weight after they quit smoking. However, not everyone does. To keep from gaining weight, have a plan in place before you quit and stick to the plan after you quit. Your plan should include:  Having healthy snacks. When you have a craving, it may help to: ? Eat popcorn, carrots, celery, or other cut vegetables. ? Chew sugar-free gum.  Changing how you eat. ? Eat small portion sizes at meals. ? Eat 4-6 small meals throughout the day instead of 1-2 large meals a day. ? Be mindful when you eat. Do not watch television or do other things that might distract you as you eat.  Exercising regularly. ? Make time to exercise each day. If you do not have  time for a long workout, do short bouts of exercise for 5-10 minutes several times a day. ? Do some form of strengthening exercise, such as weight lifting. ? Do some exercise that gets your heart beating and causes you to breathe deeply, such as walking fast, running, swimming, or biking. This is very important.  Drinking plenty of water or other low-calorie or no-calorie  drinks. Drink 6-8 glasses of water daily.   How to recognize withdrawal symptoms Your body and mind may experience discomfort as you try to get used to not having nicotine in your system. These effects are called withdrawal symptoms. They may include:  Feeling hungrier than normal.  Having trouble concentrating.  Feeling irritable or restless.  Having trouble sleeping.  Feeling depressed.  Craving a cigarette. To manage withdrawal symptoms:  Avoid places, people, and activities that trigger your cravings.  Remember why you want to quit.  Get plenty of sleep.  Avoid coffee and other caffeinated drinks. These may worsen some of your symptoms. These symptoms may surprise you. But be assured that they are normal to have when quitting smoking. How to manage cravings Come up with a plan for how to deal with your cravings. The plan should include the following:  A definition of the specific situation you want to deal with.  An alternative action you will take.  A clear idea for how this action will help.  The name of someone who might help you with this. Cravings usually last for 5-10 minutes. Consider taking the following actions to help you with your plan to deal with cravings:  Keep your mouth busy. ? Chew sugar-free gum. ? Suck on hard candies or a straw. ? Brush your teeth.  Keep your hands and body busy. ? Change to a different activity right away. ? Squeeze or play with a ball. ? Do an activity or a hobby, such as making bead jewelry, practicing needlepoint, or working with wood. ? Mix up your normal routine. ? Take a short exercise break. Go for a quick walk or run up and down stairs.  Focus on doing something kind or helpful for someone else.  Call a friend or family member to talk during a craving.  Join a support group.  Contact a quitline. Where to find support To get help or find a support group:  Call the National Cancer Institute's Smoking Quitline:  1-800-QUIT NOW (401) 507-1127)  Visit the website of the Substance Abuse and Mental Health Services Administration: SkateOasis.com.pt  Text QUIT to SmokefreeTXT: 440347 Where to find more information Visit these websites to find more information on quitting smoking:  National Cancer Institute: www.smokefree.gov  American Lung Association: www.lung.org  American Cancer Society: www.cancer.org  Centers for Disease Control and Prevention: FootballExhibition.com.br  American Heart Association: www.heart.org Contact a health care provider if:  You want to change your plan for quitting.  The medicines you are taking are not helping.  Your eating feels out of control or you cannot sleep. Get help right away if:  You feel depressed or become very anxious. Summary  Quitting smoking is a physical and mental challenge. You will face cravings, withdrawal symptoms, and temptation to smoke again. Preparation can help you as you go through these challenges.  Try different techniques to manage stress, handle social situations, and prevent weight gain.  You can deal with cravings by keeping your mouth busy (such as by chewing gum), keeping your hands and body busy, calling family or friends, or contacting a quitline  for people who want to quit smoking.  You can deal with withdrawal symptoms by avoiding places where people smoke, getting plenty of rest, and avoiding drinks with caffeine. This information is not intended to replace advice given to you by your health care provider. Make sure you discuss any questions you have with your health care provider. Document Revised: 03/31/2019 Document Reviewed: 03/31/2019 Elsevier Patient Education  2021 [employer].

## 2020-07-29 ENCOUNTER — Ambulatory Visit: Payer: BC Managed Care – PPO | Admitting: Cardiovascular Disease

## 2020-08-05 ENCOUNTER — Other Ambulatory Visit: Payer: Self-pay

## 2020-08-05 ENCOUNTER — Other Ambulatory Visit (HOSPITAL_COMMUNITY)
Admission: RE | Admit: 2020-08-05 | Discharge: 2020-08-05 | Disposition: A | Discharge status: Home / Self Care | Payer: BC Managed Care – PPO | Source: Ambulatory Visit | Attending: Family Medicine | Admitting: Family Medicine

## 2020-08-05 ENCOUNTER — Encounter: Payer: Self-pay | Admitting: Family Medicine

## 2020-08-05 ENCOUNTER — Ambulatory Visit (INDEPENDENT_AMBULATORY_CARE_PROVIDER_SITE_OTHER): Payer: BC Managed Care – PPO | Admitting: Family Medicine

## 2020-08-05 VITALS — BP 153/86 | HR 66 | Temp 97.8°F | Ht 62.25 in | Wt 182.2 lb

## 2020-08-05 DIAGNOSIS — J441 Chronic obstructive pulmonary disease with (acute) exacerbation: Secondary | ICD-10-CM | POA: Diagnosis not present

## 2020-08-05 DIAGNOSIS — E782 Mixed hyperlipidemia: Secondary | ICD-10-CM

## 2020-08-05 DIAGNOSIS — I10 Essential (primary) hypertension: Secondary | ICD-10-CM | POA: Diagnosis not present

## 2020-08-05 DIAGNOSIS — Z124 Encounter for screening for malignant neoplasm of cervix: Secondary | ICD-10-CM | POA: Diagnosis not present

## 2020-08-05 DIAGNOSIS — Z720 Tobacco use: Secondary | ICD-10-CM | POA: Diagnosis not present

## 2020-08-05 DIAGNOSIS — Z0000 Encounter for general adult medical examination without abnormal findings: Secondary | ICD-10-CM

## 2020-08-05 DIAGNOSIS — I700 Atherosclerosis of aorta: Secondary | ICD-10-CM

## 2020-08-05 DIAGNOSIS — E119 Type 2 diabetes mellitus without complications: Secondary | ICD-10-CM | POA: Diagnosis not present

## 2020-08-05 LAB — URINALYSIS, ROUTINE W REFLEX MICROSCOPIC
Bilirubin, UA: NEGATIVE
Ketones, UA: NEGATIVE
Leukocytes,UA: NEGATIVE
Nitrite, UA: NEGATIVE
Protein,UA: NEGATIVE
RBC, UA: NEGATIVE
Specific Gravity, UA: 1.02 (ref 1.005–1.030)
Urobilinogen, Ur: 1 mg/dL (ref 0.2–1.0)
pH, UA: 6.5 (ref 5.0–7.5)

## 2020-08-05 LAB — MICROALBUMIN, URINE WAIVED
Creatinine, Urine Waived: 200 mg/dL (ref 10–300)
Microalb, Ur Waived: 80 mg/L — ABNORMAL HIGH (ref 0–19)

## 2020-08-05 LAB — BAYER DCA HB A1C WAIVED: HB A1C (BAYER DCA - WAIVED): 8.3 % — ABNORMAL HIGH (ref <7.0)

## 2020-08-05 MED ORDER — METFORMIN HCL ER 500 MG PO TB24: 500.0000 mg | ORAL_TABLET | Freq: Two times a day (BID) | ORAL | 1 refills | Status: DC

## 2020-08-05 NOTE — Assessment & Plan Note (Signed)
Under good control on current regimen. Continue current regimen. Continue to monitor. Call with any concerns. Refills given. Labs drawn today.   

## 2020-08-05 NOTE — Assessment & Plan Note (Signed)
Will keep BP and cholesterol under good control. Continue to monitor. Call with any concerns.  

## 2020-08-05 NOTE — Patient Instructions (Signed)
Health Maintenance, Female Adopting a healthy lifestyle and getting preventive care are important in promoting health and wellness. Ask your health care provider about:  The right schedule for you to have regular tests and exams.  Things you can do on your own to prevent diseases and keep yourself healthy. What should I know about diet, weight, and exercise? Eat a healthy diet  Eat a diet that includes plenty of vegetables, fruits, low-fat dairy products, and lean protein.  Do not eat a lot of foods that are high in solid fats, added sugars, or sodium.   Maintain a healthy weight Body mass index (BMI) is used to identify weight problems. It estimates body fat based on height and weight. Your health care provider can help determine your BMI and help you achieve or maintain a healthy weight. Get regular exercise Get regular exercise. This is one of the most important things you can do for your health. Most adults should:  Exercise for at least 150 minutes each week. The exercise should increase your heart rate and make you sweat (moderate-intensity exercise).  Do strengthening exercises at least twice a week. This is in addition to the moderate-intensity exercise.  Spend less time sitting. Even light physical activity can be beneficial. Watch cholesterol and blood lipids Have your blood tested for lipids and cholesterol at 61 years of age, then have this test every 5 years. Have your cholesterol levels checked more often if:  Your lipid or cholesterol levels are high.  You are older than 61 years of age.  You are at high risk for heart disease. What should I know about cancer screening? Depending on your health history and family history, you may need to have cancer screening at various ages. This may include screening for:  Breast cancer.  Cervical cancer.  Colorectal cancer.  Skin cancer.  Lung cancer. What should I know about heart disease, diabetes, and high blood  pressure? Blood pressure and heart disease  High blood pressure causes heart disease and increases the risk of stroke. This is more likely to develop in people who have high blood pressure readings, are of African descent, or are overweight.  Have your blood pressure checked: ? Every 3-5 years if you are 18-39 years of age. ? Every year if you are 40 years old or older. Diabetes Have regular diabetes screenings. This checks your fasting blood sugar level. Have the screening done:  Once every three years after age 40 if you are at a normal weight and have a low risk for diabetes.  More often and at a younger age if you are overweight or have a high risk for diabetes. What should I know about preventing infection? Hepatitis B If you have a higher risk for hepatitis B, you should be screened for this virus. Talk with your health care provider to find out if you are at risk for hepatitis B infection. Hepatitis C Testing is recommended for:  Everyone born from 1945 through 1965.  Anyone with known risk factors for hepatitis C. Sexually transmitted infections (STIs)  Get screened for STIs, including gonorrhea and chlamydia, if: ? You are sexually active and are younger than 61 years of age. ? You are older than 61 years of age and your health care provider tells you that you are at risk for this type of infection. ? Your sexual activity has changed since you were last screened, and you are at increased risk for chlamydia or gonorrhea. Ask your health care provider   if you are at risk.  Ask your health care provider about whether you are at high risk for HIV. Your health care provider may recommend a prescription medicine to help prevent HIV infection. If you choose to take medicine to prevent HIV, you should first get tested for HIV. You should then be tested every 3 months for as long as you are taking the medicine. Pregnancy  If you are about to stop having your period (premenopausal) and  you may become pregnant, seek counseling before you get pregnant.  Take 400 to 800 micrograms (mcg) of folic acid every day if you become pregnant.  Ask for birth control (contraception) if you want to prevent pregnancy. Osteoporosis and menopause Osteoporosis is a disease in which the bones lose minerals and strength with aging. This can result in bone fractures. If you are 65 years old or older, or if you are at risk for osteoporosis and fractures, ask your health care provider if you should:  Be screened for bone loss.  Take a calcium or vitamin D supplement to lower your risk of fractures.  Be given hormone replacement therapy (HRT) to treat symptoms of menopause. Follow these instructions at home: Lifestyle  Do not use any products that contain nicotine or tobacco, such as cigarettes, e-cigarettes, and chewing tobacco. If you need help quitting, ask your health care provider.  Do not use street drugs.  Do not share needles.  Ask your health care provider for help if you need support or information about quitting drugs. Alcohol use  Do not drink alcohol if: ? Your health care provider tells you not to drink. ? You are pregnant, may be pregnant, or are planning to become pregnant.  If you drink alcohol: ? Limit how much you use to 0-1 drink a day. ? Limit intake if you are breastfeeding.  Be aware of how much alcohol is in your drink. In the [place] one drink equals one 12 oz bottle of beer (355 mL), one 5 oz glass of wine (148 mL), or one 1 oz glass of hard liquor (44 mL). General instructions  Schedule regular health, dental, and eye exams.  Stay current with your vaccines.  Tell your health care provider if: ? You often feel depressed. ? You have ever been abused or do not feel safe at home. Summary  Adopting a healthy lifestyle and getting preventive care are important in promoting health and wellness.  Follow your health care provider's instructions about healthy  diet, exercising, and getting tested or screened for diseases.  Follow your health care provider's instructions on monitoring your cholesterol and blood pressure. This information is not intended to replace advice given to you by your health care provider. Make sure you discuss any questions you have with your health care provider. Document Revised: 06/04/2018 Document Reviewed: 06/04/2018 Elsevier Patient Education  2021 Elsevier Inc.  

## 2020-08-05 NOTE — Progress Notes (Signed)
BP (!) 153/86   Pulse 66   Temp 97.8 F (36.6 C)   Ht 5' 2.25" (1.581 m)   Wt 182 lb 3.2 oz (82.6 kg)   SpO2 97%   BMI 33.06 kg/m    Subjective:    Patient ID: Monica Hull, female    DOB: April 19, 1961, 60 y.o.   MRN: [id number]  HPI: Monica Hull is a 60 y.o. female presenting on 08/05/2020 for comprehensive medical examination. Current medical complaints include:  DIABETES Hypoglycemic episodes:no Polydipsia/polyuria: yes Visual disturbance: no Chest pain: no Paresthesias: no Glucose Monitoring: no  Accucheck frequency: Not Checking Taking Insulin?: no Blood Pressure Monitoring: not checking Retinal Examination: Up to Date Foot Exam: Up to Date Diabetic Education: Completed Pneumovax: Up to Date Influenza: Up to Date Aspirin: yes  HYPERTENSION / HYPERLIPIDEMIA Satisfied with current treatment? yes Duration of hypertension: chronic BP monitoring frequency: not checking BP medication side effects: no Past BP meds: lisinopril Duration of hyperlipidemia: chronic Cholesterol medication side effects: no Cholesterol supplements: none Past cholesterol medications: atorvastatin Medication compliance: excellent compliance Aspirin: yes Recent stressors: no Recurrent headaches: no Visual changes: no Palpitations: no Dyspnea: no Chest pain: no Lower extremity edema: no Dizzy/lightheaded: no  COPD COPD status: stable Satisfied with current treatment?: yes Oxygen use: no Dyspnea frequency: daily Cough frequency: daily Rescue inhaler frequency:  daily Limitation of activity: yes Productive cough: no Pneumovax: Up to Date Influenza: Up to Date  She currently lives with: husband  Menopausal Symptoms: no  Depression Screen done today and results listed below:  Depression screen [facility] 2/9 08/05/2020 04/26/2020 11/12/2019 05/23/2018 05/09/2017  Decreased Interest 0 0 0 0 0  Down, Depressed, Hopeless 0 0 0 0 0  PHQ - 2 Score 0 0 0 0 0  Altered  sleeping - 2 2 3  0  Tired, decreased energy - 0 3 3 2   Change in appetite - 0 0 0 0  Feeling bad or failure about yourself  - 0 0 0 0  Trouble concentrating - 0 0 0 0  Moving slowly or fidgety/restless - 0 0 0 0  Suicidal thoughts - 0 0 0 0  PHQ-9 Score - 2 5 6 2   Difficult doing work/chores - Not difficult at all Not difficult at all Somewhat difficult -    Past Medical History:  Past Medical History:  Diagnosis Date  . Asthma   . Carpal tunnel syndrome   . Chronic kidney disease    H/O KIDNEY STONES  . COPD (chronic obstructive pulmonary disease) (HCC)   . Diabetes mellitus without complication (HCC)    DIET CONTROLLED  . Elevated liver function tests   . GERD (gastroesophageal reflux disease)   . Headache    MIGRAINES  . Hyperlipidemia   . Hypertension   . Impaired fasting glucose   . Sleep apnea    Not using CPAP- referred to sleep center  . Tobacco abuse     Surgical History:  Past Surgical History:  Procedure Laterality Date  . CARPAL TUNNEL RELEASE    . CESAREAN SECTION     X2  . HYSTEROSCOPY WITH D & C N/A 07/11/2015   Procedure: DILATATION AND CURETTAGE /HYSTEROSCOPY;  Surgeon: Hildred Laser, MD;  Location: [facility] ORS;  Service: Gynecology;  Laterality: N/A;  . PALATE / UVULA BIOPSY / EXCISION      Medications:  Current Outpatient Medications on File Prior to Visit  Medication Sig  . albuterol (PROVENTIL) (2.5 MG/3ML) 0.083% nebulizer solution Take 3  mLs (2.5 mg total) by nebulization every 6 (six) hours as needed for wheezing or shortness of breath.  Marland Kitchen albuterol (VENTOLIN HFA) 108 (90 Base) MCG/ACT inhaler Inhale 2 puffs into the lungs every 6 (six) hours as needed for wheezing or shortness of breath.  Marland Kitchen aspirin EC 81 MG tablet Take 81 mg by mouth daily. Swallow whole.  Marland Kitchen atorvastatin (LIPITOR) 80 MG tablet Take 1 tablet (80 mg total) by mouth daily.  . budesonide-formoterol (SYMBICORT) 160-4.5 MCG/ACT inhaler Inhale 2 puffs into the lungs 2 (two) times daily.   . cetirizine (ZYRTEC) 10 MG tablet Take 1 tablet (10 mg total) by mouth daily.  . diclofenac (VOLTAREN) 50 MG EC tablet Take 1 tablet (50 mg total) by mouth 4 (four) times daily.  . fesoterodine (TOVIAZ) 8 MG TB24 tablet Take 1 tablet (8 mg total) by mouth daily.  . fluticasone (FLONASE) 50 MCG/ACT nasal spray Place 2 sprays into both nostrils daily.  Marland Kitchen lisinopril (ZESTRIL) 30 MG tablet Take 1 tablet (30 mg total) by mouth daily.  Marland Kitchen nystatin (MYCOSTATIN) 100000 UNIT/ML suspension Take 5 mLs (500,000 Units total) by mouth 4 (four) times daily.  . pantoprazole (PROTONIX) 40 MG tablet Take 1 tablet (40 mg total) by mouth every morning.  Marland Kitchen Spacer/Aero-Holding Chambers (AEROCHAMBER MV) inhaler Use as instructed  . Tiotropium Bromide Monohydrate (SPIRIVA RESPIMAT) 2.5 MCG/ACT AERS Inhale 2 puffs into the lungs daily.  Marland Kitchen tiZANidine (ZANAFLEX) 4 MG capsule Take 4 mg by mouth 3 (three) times daily as needed for muscle spasms. Reported on 07/11/2015  . triamcinolone cream (KENALOG) 0.1 % Apply 1 application topically 2 (two) times daily.   No current facility-administered medications on file prior to visit.    Allergies:  Allergies  Allergen Reactions  . Advair Hfa [Fluticasone-Salmeterol] Rash    Rash and thrush  . Anoro Ellipta [Umeclidinium-Vilanterol] Rash    Causes patients tongue to break out  . Biaxin [Clarithromycin] Nausea And Vomiting and Rash  . Fluticasone Rash    Social History:  Social History   Socioeconomic History  . Marital status: Married    Spouse name: Not on file  . Number of children: Not on file  . Years of education: Not on file  . Highest education level: Not on file  Occupational History  . Not on file  Tobacco Use  . Smoking status: Current Every Day Smoker    Packs/day: 1.50    Years: 39.00    Pack years: 58.50    Types: Cigarettes  . Smokeless tobacco: Never Used  Vaping Use  . Vaping Use: Never used  Substance and Sexual Activity  . Alcohol use: No     Alcohol/week: 0.0 standard drinks  . Drug use: No  . Sexual activity: Not Currently    Partners: Male  Other Topics Concern  . Not on file  Social History Narrative  . Not on file   Social Determinants of Health   Financial Resource Strain: Not on file  Food Insecurity: Not on file  Transportation Needs: Not on file  Physical Activity: Not on file  Stress: Not on file  Social Connections: Not on file  Intimate Partner Violence: Not on file   Social History   Tobacco Use  Smoking Status Current Every Day Smoker  . Packs/day: 1.50  . Years: 39.00  . Pack years: 58.50  . Types: Cigarettes  Smokeless Tobacco Never Used   Social History   Substance and Sexual Activity  Alcohol Use No  .  Alcohol/week: 0.0 standard drinks    Family History:  Family History  Problem Relation Age of Onset  . Arthritis Mother   . Asthma Mother   . Diabetes Mother   . Heart disease Mother   . Hyperlipidemia Mother   . Hypertension Mother   . Kidney disease Mother   . Lung disease Mother   . Pancreatic cancer Mother   . Alcohol abuse Father   . Hypertension Father   . Hyperlipidemia Father   . Diabetes Brother   . Breast cancer Maternal Aunt 58  . Breast cancer Maternal Aunt 48    Past medical history, surgical history, medications, allergies, family history and social history reviewed with patient today and changes made to appropriate areas of the chart.   Review of Systems  Constitutional: Negative.   HENT: Positive for hearing loss (R ear). Negative for congestion, ear discharge, ear pain, nosebleeds, sinus pain, sore throat and tinnitus.   Eyes: Negative.   Respiratory: Positive for cough, shortness of breath and wheezing. Negative for hemoptysis, sputum production and stridor.   Cardiovascular: Positive for palpitations. Negative for chest pain, orthopnea, claudication, leg swelling and PND.  Gastrointestinal: Positive for abdominal pain (with food choices), blood in stool  (from hemorrhoid), constipation, diarrhea and heartburn. Negative for melena, nausea and vomiting.  Genitourinary: Negative.   Musculoskeletal: Positive for joint pain and myalgias. Negative for back pain, falls and neck pain.  Skin: Negative.   Neurological: Negative.   Endo/Heme/Allergies: Positive for environmental allergies and polydipsia. Bruises/bleeds easily.  Psychiatric/Behavioral: Negative.    All other ROS negative except what is listed above and in the HPI.      Objective:    BP (!) 153/86   Pulse 66   Temp 97.8 F (36.6 C)   Ht 5' 2.25" (1.581 m)   Wt 182 lb 3.2 oz (82.6 kg)   SpO2 97%   BMI 33.06 kg/m   Wt Readings from Last 3 Encounters:  08/05/20 182 lb 3.2 oz (82.6 kg)  07/25/20 182 lb (82.6 kg)  05/31/20 180 lb 9.6 oz (81.9 kg)    Physical Exam Vitals and nursing note reviewed. Exam conducted with a chaperone present.  Constitutional:      General: She is not in acute distress.    Appearance: Normal appearance. She is not ill-appearing, toxic-appearing or diaphoretic.  HENT:     Head: Normocephalic and atraumatic.     Right Ear: Tympanic membrane, ear canal and external ear normal. There is no impacted cerumen.     Left Ear: Tympanic membrane, ear canal and external ear normal. There is no impacted cerumen.     Nose: Nose normal. No congestion or rhinorrhea.     Mouth/Throat:     Mouth: Mucous membranes are moist.     Pharynx: Oropharynx is clear. No oropharyngeal exudate or posterior oropharyngeal erythema.  Eyes:     General: No scleral icterus.       Right eye: No discharge.        Left eye: No discharge.     Extraocular Movements: Extraocular movements intact.     Conjunctiva/sclera: Conjunctivae normal.     Pupils: Pupils are equal, round, and reactive to light.  Neck:     Vascular: No carotid bruit.  Cardiovascular:     Rate and Rhythm: Normal rate and regular rhythm.     Pulses: Normal pulses.     Heart sounds: No murmur heard. No friction  rub. No gallop.   Pulmonary:  Effort: Pulmonary effort is normal. No respiratory distress.     Breath sounds: Normal breath sounds. No stridor. No wheezing, rhonchi or rales.  Chest:     Chest wall: No tenderness.  Breasts:     Right: Normal.     Left: Normal.    Abdominal:     General: Abdomen is flat. Bowel sounds are normal. There is no distension.     Palpations: Abdomen is soft. There is no mass.     Tenderness: There is no abdominal tenderness. There is no right CVA tenderness, left CVA tenderness, guarding or rebound.     Hernia: No hernia is present.  Genitourinary:    Labia:        Right: No rash, tenderness, lesion or injury.        Left: No rash, tenderness, lesion or injury.      Urethra: No prolapse, urethral pain, urethral swelling or urethral lesion.     Vagina: Normal.     Cervix: Normal.     Uterus: Normal.      Adnexa: Right adnexa normal and left adnexa normal.  Musculoskeletal:        General: No swelling, tenderness, deformity or signs of injury.     Cervical back: Normal range of motion and neck supple. No rigidity. No muscular tenderness.     Right lower leg: No edema.     Left lower leg: No edema.  Lymphadenopathy:     Cervical: No cervical adenopathy.  Skin:    General: Skin is warm and dry.     Capillary Refill: Capillary refill takes less than 2 seconds.     Coloration: Skin is not jaundiced or pale.     Findings: No bruising, erythema, lesion or rash.  Neurological:     General: No focal deficit present.     Mental Status: She is alert and oriented to person, place, and time. Mental status is at baseline.     Cranial Nerves: No cranial nerve deficit.     Sensory: No sensory deficit.     Motor: No weakness.     Coordination: Coordination normal.     Gait: Gait normal.     Deep Tendon Reflexes: Reflexes normal.  Psychiatric:        Mood and Affect: Mood normal.        Behavior: Behavior normal.        Thought Content: Thought content  normal.        Judgment: Judgment normal.     Results for orders placed or performed in visit on 08/05/20  Urinalysis, Routine w reflex microscopic  Result Value Ref Range   Specific Gravity, UA 1.020 1.005 - 1.030   pH, UA 6.5 5.0 - 7.5   Color, UA Yellow Yellow   Appearance Ur Clear Clear   Leukocytes,UA Negative Negative   Protein,UA Negative Negative/Trace   Glucose, UA 3+ (A) Negative   Ketones, UA Negative Negative   RBC, UA Negative Negative   Bilirubin, UA Negative Negative   Urobilinogen, Ur 1.0 0.2 - 1.0 mg/dL   Nitrite, UA Negative Negative  Bayer DCA Hb A1c Waived  Result Value Ref Range   HB A1C (BAYER DCA - WAIVED) 8.3 (H) <7.0 %  Microalbumin, Urine Waived  Result Value Ref Range   Microalb, Ur Waived 80 (H) 0 - 19 mg/L   Creatinine, Urine Waived 200 10 - 300 mg/dL   Microalb/Creat Ratio 30-300 (H) <30 mg/g      Assessment &  Plan:   Problem List Items Addressed This Visit      Cardiovascular and Mediastinum   Hypertension    Running a little high today. Will work on [employer] and recheck 3 months. Call with any concerns.       Aortic atherosclerosis (HCC)    Will keep BP and cholesterol under good control. Continue to monitor. Call with any concerns.         Respiratory   COPD (chronic obstructive pulmonary disease) (HCC)    Under good control on current regimen. Continue current regimen. Continue to monitor. Call with any concerns. Refills given. Labs drawn today.        Relevant Orders   CBC with Differential/Platelet   Comprehensive metabolic panel     Endocrine   Diet-controlled diabetes mellitus (HCC)    Not doing well with A1c up to 8.3. Will increase her metformin to BID and recheck 3 months. Call with any concerns. Continue to monitor.       Relevant Medications   metFORMIN (GLUCOPHAGE-XR) 500 MG 24 hr tablet   Other Relevant Orders   CBC with Differential/Platelet   Comprehensive metabolic panel   Bayer DCA Hb H8N Waived  (Completed)   Microalbumin, Urine Waived (Completed)     Other   Hyperlipidemia    Under good control on current regimen. Continue current regimen. Continue to monitor. Call with any concerns. Refills given. Labs drawn today.       Relevant Orders   CBC with Differential/Platelet   Comprehensive metabolic panel   Lipid Panel w/o Chol/HDL Ratio    Other Visit Diagnoses    Routine general medical examination at a health care facility    -  Primary   Vaccines up to date. Screening labs checked today. Pap done today. Mammo and colonoscopy up to date. Continue diet and exercise. Call with any concerns.    Tobacco abuse       Not ready to quit. Continue to work on cutting down. Call with any concerns.    Relevant Orders   Urinalysis, Routine w reflex microscopic (Completed)   Screening for cervical cancer       Pap done today.    Relevant Orders   Cytology - PAP   Essential hypertension       Relevant Orders   CBC with Differential/Platelet   Comprehensive metabolic panel   TSH   Microalbumin, Urine Waived (Completed)       Follow up plan: Return in about 3 months (around 11/02/2020).   LABORATORY TESTING:  - Pap smear: pap done  IMMUNIZATIONS:   - Tdap: Tetanus vaccination status reviewed: last tetanus booster within 10 years. - Influenza: Up to date - Pneumovax: Up to date - Prevnar: Up to date - COVID: Up to date  SCREENING: -Mammogram: Up to date  - Colonoscopy: Up to date  - Bone Density: Up to date   PATIENT COUNSELING:   Advised to take 1 mg of folate supplement per day if capable of pregnancy.   Sexuality: Discussed sexually transmitted diseases, partner selection, use of condoms, avoidance of unintended pregnancy  and contraceptive alternatives.   Advised to avoid cigarette smoking.  I discussed with the patient that most people either abstain from alcohol or drink within safe limits (<=14/week and <=4 drinks/occasion for males, <=7/weeks and <= 3  drinks/occasion for females) and that the risk for alcohol disorders and other health effects rises proportionally with the number of drinks per week and how often a  drinker exceeds daily limits.  Discussed cessation/primary prevention of drug use and availability of treatment for abuse.   Diet: Encouraged to adjust caloric intake to maintain  or achieve ideal body weight, to reduce intake of dietary saturated fat and total fat, to limit sodium intake by avoiding high sodium foods and not adding table salt, and to maintain adequate dietary potassium and calcium preferably from fresh fruits, vegetables, and low-fat dairy products.    stressed the importance of regular exercise  Injury prevention: Discussed safety belts, safety helmets, smoke detector, smoking near bedding or upholstery.   Dental health: Discussed importance of regular tooth brushing, flossing, and dental visits.    NEXT PREVENTATIVE PHYSICAL DUE IN 1 YEAR. Return in about 3 months (around 11/02/2020).

## 2020-08-05 NOTE — Assessment & Plan Note (Signed)
Not doing well with A1c up to 8.3. Will increase her metformin to BID and recheck 3 months. Call with any concerns. Continue to monitor.

## 2020-08-05 NOTE — Assessment & Plan Note (Signed)
Running a little high today. Will work on [employer] and recheck 3 months. Call with any concerns.

## 2020-08-06 LAB — CBC WITH DIFFERENTIAL/PLATELET
Basophils Absolute: 0.1 10*3/uL (ref 0.0–0.2)
Basos: 1 %
EOS (ABSOLUTE): 0.3 10*3/uL (ref 0.0–0.4)
Eos: 3 %
Hematocrit: 47.3 % — ABNORMAL HIGH (ref 34.0–46.6)
Hemoglobin: 16.1 g/dL — ABNORMAL HIGH (ref 11.1–15.9)
Immature Grans (Abs): 0 10*3/uL (ref 0.0–0.1)
Immature Granulocytes: 0 %
Lymphocytes Absolute: 2.5 10*3/uL (ref 0.7–3.1)
Lymphs: 32 %
MCH: 30.7 pg (ref 26.6–33.0)
MCHC: 34 g/dL (ref 31.5–35.7)
MCV: 90 fL (ref 79–97)
Monocytes Absolute: 0.5 10*3/uL (ref 0.1–0.9)
Monocytes: 6 %
Neutrophils Absolute: 4.6 10*3/uL (ref 1.4–7.0)
Neutrophils: 58 %
Platelets: 219 10*3/uL (ref 150–450)
RBC: 5.24 x10E6/uL (ref 3.77–5.28)
RDW: 12.3 % (ref 11.7–15.4)
WBC: 7.9 10*3/uL (ref 3.4–10.8)

## 2020-08-06 LAB — COMPREHENSIVE METABOLIC PANEL
ALT: 34 IU/L — ABNORMAL HIGH (ref 0–32)
AST: 36 IU/L (ref 0–40)
Albumin/Globulin Ratio: 1.5 (ref 1.2–2.2)
Albumin: 4.2 g/dL (ref 3.8–4.9)
Alkaline Phosphatase: 145 IU/L — ABNORMAL HIGH (ref 44–121)
BUN/Creatinine Ratio: 10 — ABNORMAL LOW (ref 12–28)
BUN: 7 mg/dL — ABNORMAL LOW (ref 8–27)
Bilirubin Total: 0.6 mg/dL (ref 0.0–1.2)
CO2: 23 mmol/L (ref 20–29)
Calcium: 9.5 mg/dL (ref 8.7–10.3)
Chloride: 100 mmol/L (ref 96–106)
Creatinine, Ser: 0.7 mg/dL (ref 0.57–1.00)
GFR calc Af Amer: 109 mL/min/{1.73_m2} (ref >59)
GFR calc non Af Amer: 94 mL/min/{1.73_m2} (ref >59)
Globulin, Total: 2.8 g/dL (ref 1.5–4.5)
Glucose: 239 mg/dL — ABNORMAL HIGH (ref 65–99)
Potassium: 3.9 mmol/L (ref 3.5–5.2)
Sodium: 140 mmol/L (ref 134–144)
Total Protein: 7 g/dL (ref 6.0–8.5)

## 2020-08-06 LAB — TSH: TSH: 1.12 u[IU]/mL (ref 0.450–4.500)

## 2020-08-06 LAB — LIPID PANEL W/O CHOL/HDL RATIO
Cholesterol, Total: 150 mg/dL (ref 100–199)
HDL: 44 mg/dL (ref >39)
LDL Chol Calc (NIH): 80 mg/dL (ref 0–99)
Triglycerides: 147 mg/dL (ref 0–149)
VLDL Cholesterol Cal: 26 mg/dL (ref 5–40)

## 2020-08-11 LAB — CYTOLOGY - PAP
Adequacy: ABSENT
Comment: NEGATIVE
Diagnosis: NEGATIVE
High risk HPV: NEGATIVE

## 2020-08-15 ENCOUNTER — Other Ambulatory Visit: Payer: Self-pay | Admitting: Pulmonary Disease

## 2020-09-22 ENCOUNTER — Telehealth (INDEPENDENT_AMBULATORY_CARE_PROVIDER_SITE_OTHER): Payer: BC Managed Care – PPO | Admitting: Nurse Practitioner

## 2020-09-22 ENCOUNTER — Encounter: Payer: Self-pay | Admitting: Nurse Practitioner

## 2020-09-22 ENCOUNTER — Ambulatory Visit: Payer: Self-pay | Admitting: *Deleted

## 2020-09-22 DIAGNOSIS — J441 Chronic obstructive pulmonary disease with (acute) exacerbation: Secondary | ICD-10-CM | POA: Diagnosis not present

## 2020-09-22 MED ORDER — PREDNISONE 10 MG PO TABS: ORAL_TABLET | ORAL | 0 refills | Status: DC

## 2020-09-22 MED ORDER — DOXYCYCLINE HYCLATE 100 MG PO TABS: 100.0000 mg | ORAL_TABLET | Freq: Two times a day (BID) | ORAL | 0 refills | Status: DC

## 2020-09-22 NOTE — Patient Instructions (Signed)

## 2020-09-22 NOTE — Assessment & Plan Note (Signed)
Acute exacerbation. Grandson recently sick with gastroenteritis. Recommend a covid-19 test. She has one at home she will take and call with the results. She is up to date on covid-19 vaccinations and booster. Continue using inhalers and nebulizer at home. Will send in prednisone and doxycycline to pharmacy. Encourage fluids and rest. Follow-up in 2 weeks or sooner if symptoms worsen.

## 2020-09-22 NOTE — Progress Notes (Signed)
Acute Office Visit  Subjective:    Patient ID: Monica Hull, female    DOB: 11/05/1960, 60 y.o.   MRN: [id number]  Chief Complaint  Patient presents with  . Cough    Pt states she has been having a cough, SOB, and wheezing since last night.     HPI Patient is in today for cough, shortness of breath, and wheezing. Her cough worsened 2 weeks ago and her wheezing and shortness of breath started a couple days ago. She has been using her inhalers to help with her symptoms. She is asking for prednisone to help with her symptoms.  UPPER RESPIRATORY TRACT INFECTION  Worst symptom: cough Fever: yes, maybe last night Cough: yes, dry Shortness of breath: yes, with activity Wheezing: yes Chest pain: no Chest tightness: no Chest congestion: no Nasal congestion: yes Runny nose: no Post nasal drip: no Sneezing: no Sore throat: no Swollen glands: no Sinus pressure: yes Headache: yes Face pain: no Toothache: yes Ear pain: no  Ear pressure: no  Eyes red/itching:no Eye drainage/crusting: no  Vomiting: no , nausea yes Rash: no Fatigue: yes Sick contacts: yes, grandson has stomach bug a few weeks ago Strep contacts: no  Context: stable Recurrent sinusitis: no Relief with OTC cold/cough medications: no  Treatments attempted: none    Past Medical History:  Diagnosis Date  . Asthma   . Carpal tunnel syndrome   . Chronic kidney disease    H/O KIDNEY STONES  . COPD (chronic obstructive pulmonary disease) (HCC)   . Diabetes mellitus without complication (HCC)    DIET CONTROLLED  . Elevated liver function tests   . GERD (gastroesophageal reflux disease)   . Headache    MIGRAINES  . Hyperlipidemia   . Hypertension   . Impaired fasting glucose   . Sleep apnea    Not using CPAP- referred to sleep center  . Tobacco abuse     Past Surgical History:  Procedure Laterality Date  . CARPAL TUNNEL RELEASE    . CESAREAN SECTION     X2  . HYSTEROSCOPY WITH D & C N/A  07/11/2015   Procedure: DILATATION AND CURETTAGE /HYSTEROSCOPY;  Surgeon: Hildred Laser, MD;  Location: [facility] ORS;  Service: Gynecology;  Laterality: N/A;  . PALATE / UVULA BIOPSY / EXCISION      Family History  Problem Relation Age of Onset  . Arthritis Mother   . Asthma Mother   . Diabetes Mother   . Heart disease Mother   . Hyperlipidemia Mother   . Hypertension Mother   . Kidney disease Mother   . Lung disease Mother   . Pancreatic cancer Mother   . Alcohol abuse Father   . Hypertension Father   . Hyperlipidemia Father   . Diabetes Brother   . Breast cancer Maternal Aunt 58  . Breast cancer Maternal Aunt 37    Social History   Socioeconomic History  . Marital status: Married    Spouse name: Not on file  . Number of children: Not on file  . Years of education: Not on file  . Highest education level: Not on file  Occupational History  . Not on file  Tobacco Use  . Smoking status: Current Every Day Smoker    Packs/day: 1.50    Years: 39.00    Pack years: 58.50    Types: Cigarettes  . Smokeless tobacco: Never Used  Vaping Use  . Vaping Use: Never used  Substance and Sexual Activity  . Alcohol  use: No    Alcohol/week: 0.0 standard drinks  . Drug use: No  . Sexual activity: Not Currently    Partners: Male  Other Topics Concern  . Not on file  Social History Narrative  . Not on file   Social Determinants of Health   Financial Resource Strain: Not on file  Food Insecurity: Not on file  Transportation Needs: Not on file  Physical Activity: Not on file  Stress: Not on file  Social Connections: Not on file  Intimate Partner Violence: Not on file    Outpatient Medications Prior to Visit  Medication Sig Dispense Refill  . albuterol (PROVENTIL) (2.5 MG/3ML) 0.083% nebulizer solution Take 3 mLs (2.5 mg total) by nebulization every 6 (six) hours as needed for wheezing or shortness of breath. 150 mL 1  . albuterol (VENTOLIN HFA) 108 (90 Base) MCG/ACT inhaler Inhale  2 puffs into the lungs every 6 (six) hours as needed for wheezing or shortness of breath. 18 g 6  . aspirin EC 81 MG tablet Take 81 mg by mouth daily. Swallow whole.    Marland Kitchen atorvastatin (LIPITOR) 80 MG tablet Take 1 tablet (80 mg total) by mouth daily. 90 tablet 3  . budesonide-formoterol (SYMBICORT) 160-4.5 MCG/ACT inhaler Inhale 2 puffs into the lungs 2 (two) times daily. 3 Inhaler 3  . cetirizine (ZYRTEC) 10 MG tablet Take 1 tablet (10 mg total) by mouth daily. 90 tablet 4  . diclofenac (VOLTAREN) 50 MG EC tablet Take 1 tablet (50 mg total) by mouth 4 (four) times daily. 120 tablet 6  . fesoterodine (TOVIAZ) 8 MG TB24 tablet Take 1 tablet (8 mg total) by mouth daily. 90 tablet 3  . fluticasone (FLONASE) 50 MCG/ACT nasal spray Place 2 sprays into both nostrils daily. 16 g 11  . lisinopril (ZESTRIL) 30 MG tablet Take 1 tablet (30 mg total) by mouth daily. 90 tablet 3  . metFORMIN (GLUCOPHAGE-XR) 500 MG 24 hr tablet Take 1 tablet (500 mg total) by mouth 2 (two) times daily with a meal. 180 tablet 1  . nystatin (MYCOSTATIN) 100000 UNIT/ML suspension Take 5 mLs (500,000 Units total) by mouth 4 (four) times daily. 60 mL 0  . pantoprazole (PROTONIX) 40 MG tablet Take 1 tablet (40 mg total) by mouth every morning. 90 tablet 1  . Spacer/Aero-Holding Chambers (AEROCHAMBER MV) inhaler Use as instructed 1 each 0  . SPIRIVA RESPIMAT 2.5 MCG/ACT AERS INHALE 2 PUFFS INTO THE LUNGS DAILY 12 g 0  . tiZANidine (ZANAFLEX) 4 MG capsule Take 4 mg by mouth 3 (three) times daily as needed for muscle spasms. Reported on 07/11/2015    . triamcinolone cream (KENALOG) 0.1 % Apply 1 application topically 2 (two) times daily. 30 g 0   No facility-administered medications prior to visit.    Allergies  Allergen Reactions  . Advair Hfa [Fluticasone-Salmeterol] Rash    Rash and thrush  . Anoro Ellipta [Umeclidinium-Vilanterol] Rash    Causes patients tongue to break out  . Biaxin [Clarithromycin] Nausea And Vomiting and  Rash  . Fluticasone Rash    Review of Systems  Constitutional: Positive for fatigue and fever (didn't measure, but feels like she had one).  HENT: Positive for congestion, rhinorrhea and sinus pressure. Negative for ear pain, postnasal drip and sore throat.   Eyes: Negative.   Respiratory: Positive for shortness of breath (when laying down) and wheezing. Negative for chest tightness.   Cardiovascular: Negative.   Gastrointestinal: Positive for abdominal pain, constipation and nausea. Negative for  vomiting.  Genitourinary: Negative.   Musculoskeletal: Negative.   Skin: Negative.   Neurological: Negative.   Psychiatric/Behavioral: Negative.        Objective:    Physical Exam Vitals and nursing note reviewed.  Constitutional:      General: She is not in acute distress.    Appearance: Normal appearance.  HENT:     Head: Normocephalic.  Eyes:     Conjunctiva/sclera: Conjunctivae normal.  Pulmonary:     Comments: Able to talk in complete sentences. Dry cough noted during visit Neurological:     Mental Status: She is alert and oriented to person, place, and time.  Psychiatric:        Mood and Affect: Mood normal.        Behavior: Behavior normal.        Thought Content: Thought content normal.        Judgment: Judgment normal.     There were no vitals taken for this visit. Wt Readings from Last 3 Encounters:  08/05/20 182 lb 3.2 oz (82.6 kg)  07/25/20 182 lb (82.6 kg)  05/31/20 180 lb 9.6 oz (81.9 kg)     Lab Results  Component Value Date   TSH 1.120 08/05/2020   Lab Results  Component Value Date   WBC 7.9 08/05/2020   HGB 16.1 (H) 08/05/2020   HCT 47.3 (H) 08/05/2020   MCV 90 08/05/2020   PLT 219 08/05/2020   Lab Results  Component Value Date   NA 140 08/05/2020   K 3.9 08/05/2020   CO2 23 08/05/2020   GLUCOSE 239 (H) 08/05/2020   BUN 7 (L) 08/05/2020   CREATININE 0.70 08/05/2020   BILITOT 0.6 08/05/2020   ALKPHOS 145 (H) 08/05/2020   AST 36  08/05/2020   ALT 34 (H) 08/05/2020   PROT 7.0 08/05/2020   ALBUMIN 4.2 08/05/2020   CALCIUM 9.5 08/05/2020   ANIONGAP 9 07/18/2020   Lab Results  Component Value Date   CHOL 150 08/05/2020   Lab Results  Component Value Date   HDL 44 08/05/2020   Lab Results  Component Value Date   LDLCALC 80 08/05/2020   Lab Results  Component Value Date   TRIG 147 08/05/2020   Lab Results  Component Value Date   CHOLHDL 4.4 07/18/2020   Lab Results  Component Value Date   HGBA1C 8.3 (H) 08/05/2020       Assessment & Plan:   Problem List Items Addressed This Visit      Respiratory   Chronic obstructive pulmonary disease with acute exacerbation (HCC) - Primary    Acute exacerbation. Grandson recently sick with gastroenteritis. Recommend a covid-19 test. She has one at home she will take and call with the results. She is up to date on covid-19 vaccinations and booster. Continue using inhalers and nebulizer at home. Will send in prednisone and doxycycline to pharmacy. Encourage fluids and rest. Follow-up in 2 weeks or sooner if symptoms worsen.      Relevant Medications   predniSONE (DELTASONE) 10 MG tablet       Meds ordered this encounter  Medications  . predniSONE (DELTASONE) 10 MG tablet    Sig: Take 6 tablets for 2 days, then 5 tablets for 2 days, then decrease by 1 tablet every two days until gone    Dispense:  42 tablet    Refill:  0  . doxycycline (VIBRA-TABS) 100 MG tablet    Sig: Take 1 tablet (100 mg total) by mouth 2 (  two) times daily.    Dispense:  20 tablet    Refill:  0     . This visit was completed via MyChart due to the restrictions of the COVID-19 pandemic. All issues as above were discussed and addressed. Physical exam was done as above through visual confirmation on MyChart. If it was felt that the patient should be evaluated in the office, they were directed there. The patient verbally consented to this visit. . Location of the patient:  home . Location of the provider: work . Those involved with this call:  . Provider: Alene Mires, DNP . CMA: Wilhemena Durie, CMA . Front Desk/Registration: Harriet Pho  . Time spent on call: 20 minutes with patient face to face via video conference. More than 50% of this time was spent in counseling and coordination of care. 20 minutes total spent in review of patient's record and preparation of their chart.    Gerre Scull, NP

## 2020-09-22 NOTE — Telephone Encounter (Signed)
  Pt called in c/o wheezing that started last night.   She is also having some shortness of breath which she says is normal for her.   "I have COPD and asthma".   "The weather change always messes me up".   She is using her inhalers which are helping but the wheezing is concerning her.  Covid questionnaire completed.  She prefers a video visit.   I made her an appt for today at 11:00 MyChart video visit with Rodman Pickle, NP.  PCP Dr Olevia Perches did not have any openings today.  Protocol is to be seen within 4 hrs.  I sent my notes to [facility].     Reason for Disposition . [1] Longstanding difficulty breathing (e.g., CHF, COPD, emphysema) AND [2] WORSE than normal    COPD and asthma  Answer Assessment - Initial Assessment Questions 1. RESPIRATORY STATUS: "Describe your breathing?" (e.g., wheezing, shortness of breath, unable to speak, severe coughing)      Coughing and wheezing began last night.   I have COPD and asthma.   2. ONSET: "When did this breathing problem begin?"      Last night 3. PATTERN "Does the difficult breathing come and go, or has it been constant since it started?"      Coming and going.   I use 2 inhalers. 4. SEVERITY: "How bad is your breathing?" (e.g., mild, moderate, severe)    - MILD: No SOB at rest, mild SOB with walking, speaks normally in sentences, can lay down, no retractions, pulse < 100.    - MODERATE: SOB at rest, SOB with minimal exertion and prefers to sit, cannot lie down flat, speaks in phrases, mild retractions, audible wheezing, pulse 100-120.    - SEVERE: Very SOB at rest, speaks in single words, struggling to breathe, sitting hunched forward, retractions, pulse > 120      Just my normal but the wheezing is new. 5. RECURRENT SYMPTOM: "Have you had difficulty breathing before?" If Yes, ask: "When was the last time?" and "What happened that time?"      Yes 6. CARDIAC HISTORY: "Do you have any history of heart disease?" (e.g., heart  attack, angina, bypass surgery, angioplasty)      No 7. LUNG HISTORY: "Do you have any history of lung disease?"  (e.g., pulmonary embolus, asthma, emphysema)     COPD and asthma 8. CAUSE: "What do you think is causing the breathing problem?"      My asthma The weather changes it causes me to wheeze and get short of breath. 9. OTHER SYMPTOMS: "Do you have any other symptoms? (e.g., dizziness, runny nose, cough, chest pain, fever)     I think a little fever but not sure.   My nose is stopped up.  The pollen gets me.   My gums are causing my teeth to hurt.   I have some bad teeth.   I'm seeing a dentist to see about getting the bad teeth pulled. 10. PREGNANCY: "Is there any chance you are pregnant?" "When was your last menstrual period?"       N/A due to age 60. TRAVEL: "Have you traveled out of the country in the last month?" (e.g., travel history, exposures)       *No Answer*  Protocols used: BREATHING DIFFICULTY-A-AH

## 2020-09-23 ENCOUNTER — Telehealth: Payer: Self-pay

## 2020-09-23 NOTE — Telephone Encounter (Signed)
Thank you :)

## 2020-09-23 NOTE — Telephone Encounter (Signed)
Pt stated her covid test was negative and wanted to make provider aware.

## 2020-10-10 ENCOUNTER — Ambulatory Visit: Payer: BC Managed Care – PPO | Admitting: Family Medicine

## 2020-10-10 ENCOUNTER — Other Ambulatory Visit: Payer: Self-pay

## 2020-10-10 ENCOUNTER — Encounter: Payer: Self-pay | Admitting: Family Medicine

## 2020-10-10 VITALS — BP 142/90 | HR 85 | Temp 98.5°F | Wt 180.0 lb

## 2020-10-10 DIAGNOSIS — K0889 Other specified disorders of teeth and supporting structures: Secondary | ICD-10-CM | POA: Diagnosis not present

## 2020-10-10 DIAGNOSIS — J441 Chronic obstructive pulmonary disease with (acute) exacerbation: Secondary | ICD-10-CM | POA: Diagnosis not present

## 2020-10-10 MED ORDER — AMOXICILLIN-POT CLAVULANATE 875-125 MG PO TABS: 1.0000 | ORAL_TABLET | Freq: Two times a day (BID) | ORAL | 0 refills | Status: DC

## 2020-10-10 MED ORDER — ALBUTEROL SULFATE (2.5 MG/3ML) 0.083% IN NEBU
2.5000 mg | INHALATION_SOLUTION | Freq: Once | RESPIRATORY_TRACT | Status: AC
  Administered 2020-10-10: 2.5 mg via RESPIRATORY_TRACT

## 2020-10-10 NOTE — Progress Notes (Signed)
BP (!) 142/90   Pulse 85   Temp 98.5 F (36.9 C)   Wt 180 lb (81.6 kg)   SpO2 97%   BMI 32.66 kg/m    Subjective:    Patient ID: Monica Hull, female    DOB: 12/26/1960, 60 y.o.   MRN: [id number]  HPI: Monica Hull is a 60 y.o. female  Chief Complaint  Patient presents with  . COPD  . Dental Pain    Patient states her took started hurting a few days ago, her left side of her jaw is swollen. Has dental appointment on 10/20/20   Breathing is doing better. She has been using her inhalers  DENTAL PAIN Duration: a few days Involved teeth: left and lower Dentist evaluation: scheduled for 4/28 Mechanism of injury:  unknown Onset: sudden Severity: severe Quality: aching and sore Frequency: constant Radiation: none Aggravating factors: cold, heat, hard foods and chewing Alleviating factors: nothing Status: worse Treatments attempted: nothing Relief with NSAIDs?: no Fevers: no Swelling: yes Redness: no Paresthesias / decreased sensation: no Sinus pressure: no  Relevant past medical, surgical, family and social history reviewed and updated as indicated. Interim medical history since our last visit reviewed. Allergies and medications reviewed and updated.  Review of Systems  Constitutional: Negative.   HENT: Positive for dental problem. Negative for congestion, drooling, ear discharge, ear pain, facial swelling, hearing loss, mouth sores, nosebleeds, postnasal drip, rhinorrhea, sinus pressure, sinus pain, sneezing, sore throat, tinnitus, trouble swallowing and voice change.   Respiratory: Positive for cough, shortness of breath and wheezing. Negative for apnea, choking, chest tightness and stridor.   Cardiovascular: Negative.   Gastrointestinal: Negative.   Musculoskeletal: Negative.   Neurological: Negative.     Per HPI unless specifically indicated above     Objective:    BP (!) 142/90   Pulse 85   Temp 98.5 F (36.9 C)   Wt 180 lb (81.6  kg)   SpO2 97%   BMI 32.66 kg/m   Wt Readings from Last 3 Encounters:  10/10/20 180 lb (81.6 kg)  08/05/20 182 lb 3.2 oz (82.6 kg)  07/25/20 182 lb (82.6 kg)    Physical Exam Vitals and nursing note reviewed.  Constitutional:      General: She is not in acute distress.    Appearance: Normal appearance. She is not ill-appearing, toxic-appearing or diaphoretic.  HENT:     Head: Normocephalic and atraumatic.     Comments: Swelling on L lower jaw    Right Ear: External ear normal.     Left Ear: External ear normal.     Nose: Nose normal.     Mouth/Throat:     Mouth: Mucous membranes are moist.     Pharynx: Oropharynx is clear.  Eyes:     General: No scleral icterus.       Right eye: No discharge.        Left eye: No discharge.     Extraocular Movements: Extraocular movements intact.     Conjunctiva/sclera: Conjunctivae normal.     Pupils: Pupils are equal, round, and reactive to light.  Cardiovascular:     Rate and Rhythm: Normal rate and regular rhythm.     Pulses: Normal pulses.     Heart sounds: Normal heart sounds. No murmur heard. No friction rub. No gallop.   Pulmonary:     Effort: Pulmonary effort is normal. No respiratory distress.     Breath sounds: No stridor. Rhonchi (bilaterally) present. No wheezing or  rales.  Chest:     Chest wall: No tenderness.  Musculoskeletal:        General: Normal range of motion.     Cervical back: Normal range of motion and neck supple.  Skin:    General: Skin is warm and dry.     Capillary Refill: Capillary refill takes less than 2 seconds.     Coloration: Skin is not jaundiced or pale.     Findings: No bruising, erythema, lesion or rash.  Neurological:     General: No focal deficit present.     Mental Status: She is alert and oriented to person, place, and time. Mental status is at baseline.  Psychiatric:        Mood and Affect: Mood normal.        Behavior: Behavior normal.        Thought Content: Thought content normal.         Judgment: Judgment normal.     Results for orders placed or performed in visit on 08/05/20  CBC with Differential/Platelet  Result Value Ref Range   WBC 7.9 3.4 - 10.8 x10E3/uL   RBC 5.24 3.77 - 5.28 x10E6/uL   Hemoglobin 16.1 (H) 11.1 - 15.9 g/dL   Hematocrit 17.6 (H) 16.0 - 46.6 %   MCV 90 79 - 97 fL   MCH 30.7 26.6 - 33.0 pg   MCHC 34.0 31.5 - 35.7 g/dL   RDW 73.7 10.6 - 26.9 %   Platelets 219 150 - 450 x10E3/uL   Neutrophils 58 Not Estab. %   Lymphs 32 Not Estab. %   Monocytes 6 Not Estab. %   Eos 3 Not Estab. %   Basos 1 Not Estab. %   Neutrophils Absolute 4.6 1.4 - 7.0 x10E3/uL   Lymphocytes Absolute 2.5 0.7 - 3.1 x10E3/uL   Monocytes Absolute 0.5 0.1 - 0.9 x10E3/uL   EOS (ABSOLUTE) 0.3 0.0 - 0.4 x10E3/uL   Basophils Absolute 0.1 0.0 - 0.2 x10E3/uL   Immature Granulocytes 0 Not Estab. %   Immature Grans (Abs) 0.0 0.0 - 0.1 x10E3/uL  Comprehensive metabolic panel  Result Value Ref Range   Glucose 239 (H) 65 - 99 mg/dL   BUN 7 (L) 8 - 27 mg/dL   Creatinine, Ser 4.85 0.57 - 1.00 mg/dL   GFR calc non Af Amer 94 >59 mL/min/1.73   GFR calc Af Amer 109 >59 mL/min/1.73   BUN/Creatinine Ratio 10 (L) 12 - 28   Sodium 140 134 - 144 mmol/L   Potassium 3.9 3.5 - 5.2 mmol/L   Chloride 100 96 - 106 mmol/L   CO2 23 20 - 29 mmol/L   Calcium 9.5 8.7 - 10.3 mg/dL   Total Protein 7.0 6.0 - 8.5 g/dL   Albumin 4.2 3.8 - 4.9 g/dL   Globulin, Total 2.8 1.5 - 4.5 g/dL   Albumin/Globulin Ratio 1.5 1.2 - 2.2   Bilirubin Total 0.6 0.0 - 1.2 mg/dL   Alkaline Phosphatase 145 (H) 44 - 121 IU/L   AST 36 0 - 40 IU/L   ALT 34 (H) 0 - 32 IU/L  Lipid Panel w/o Chol/HDL Ratio  Result Value Ref Range   Cholesterol, Total 150 100 - 199 mg/dL   Triglycerides 462 0 - 149 mg/dL   HDL 44 >70 mg/dL   VLDL Cholesterol Cal 26 5 - 40 mg/dL   LDL Chol Calc (NIH) 80 0 - 99 mg/dL  Urinalysis, Routine w reflex microscopic  Result Value Ref Range  Specific Gravity, UA 1.020 1.005 - 1.030   pH, UA  6.5 5.0 - 7.5   Color, UA Yellow Yellow   Appearance Ur Clear Clear   Leukocytes,UA Negative Negative   Protein,UA Negative Negative/Trace   Glucose, UA 3+ (A) Negative   Ketones, UA Negative Negative   RBC, UA Negative Negative   Bilirubin, UA Negative Negative   Urobilinogen, Ur 1.0 0.2 - 1.0 mg/dL   Nitrite, UA Negative Negative  TSH  Result Value Ref Range   TSH 1.120 0.450 - 4.500 uIU/mL  Bayer DCA Hb A1c Waived  Result Value Ref Range   HB A1C (BAYER DCA - WAIVED) 8.3 (H) <7.0 %  Microalbumin, Urine Waived  Result Value Ref Range   Microalb, Ur Waived 80 (H) 0 - 19 mg/L   Creatinine, Urine Waived 200 10 - 300 mg/dL   Microalb/Creat Ratio 30-300 (H) <30 mg/g  Cytology - PAP  Result Value Ref Range   High risk HPV Negative    Adequacy      Satisfactory for evaluation; transformation zone component ABSENT.   Diagnosis      - Negative for intraepithelial lesion or malignancy (NILM)   Comment Normal Reference Range HPV - Negative       Assessment & Plan:   Problem List Items Addressed This Visit      Respiratory   Chronic obstructive pulmonary disease with acute exacerbation (HCC) - Primary    Still really wheezy and rhoncherous. Neb in the office today. Lungs clear after neb- will continue nebs at home. No need for prednisone now.       Relevant Medications   albuterol (PROVENTIL) (2.5 MG/3ML) 0.083% nebulizer solution 2.5 mg    Other Visit Diagnoses    Pain, dental       Will treat with augmentin. Advised her to get in with her dentist ASAP- appt scheduled for 4/28       Follow up plan: Return in about 4 weeks (around 11/07/2020).

## 2020-10-10 NOTE — Assessment & Plan Note (Signed)
Still really wheezy and rhoncherous. Neb in the office today. Lungs clear after neb- will continue nebs at home. No need for prednisone now.

## 2020-11-02 ENCOUNTER — Ambulatory Visit: Payer: BC Managed Care – PPO | Admitting: Family Medicine

## 2020-11-07 ENCOUNTER — Encounter: Payer: Self-pay | Admitting: Family Medicine

## 2020-11-07 ENCOUNTER — Ambulatory Visit: Payer: BC Managed Care – PPO | Admitting: Family Medicine

## 2020-11-07 ENCOUNTER — Other Ambulatory Visit: Payer: Self-pay

## 2020-11-07 VITALS — BP 114/78 | HR 82 | Temp 97.8°F | Wt 179.0 lb

## 2020-11-07 DIAGNOSIS — G8929 Other chronic pain: Secondary | ICD-10-CM

## 2020-11-07 DIAGNOSIS — E119 Type 2 diabetes mellitus without complications: Secondary | ICD-10-CM | POA: Diagnosis not present

## 2020-11-07 DIAGNOSIS — J418 Mixed simple and mucopurulent chronic bronchitis: Secondary | ICD-10-CM | POA: Diagnosis not present

## 2020-11-07 DIAGNOSIS — M25512 Pain in left shoulder: Secondary | ICD-10-CM

## 2020-11-07 DIAGNOSIS — M25511 Pain in right shoulder: Secondary | ICD-10-CM | POA: Diagnosis not present

## 2020-11-07 LAB — BAYER DCA HB A1C WAIVED: HB A1C (BAYER DCA - WAIVED): 10.2 % — ABNORMAL HIGH (ref <7.0)

## 2020-11-07 MED ORDER — METFORMIN HCL ER 500 MG PO TB24: 1000.0000 mg | ORAL_TABLET | Freq: Two times a day (BID) | ORAL | 1 refills | Status: DC

## 2020-11-07 MED ORDER — EMPAGLIFLOZIN 25 MG PO TABS
25.0000 mg | ORAL_TABLET | Freq: Every day | ORAL | 3 refills | Status: DC
Start: 2020-11-07 — End: 2021-02-07

## 2020-11-07 NOTE — Assessment & Plan Note (Signed)
Not doing well with a1c of 10.3 up from 8.3- will increase metformin to 1000mg  BID and add jardiance. Recheck  84months. Call with any concerns.

## 2020-11-07 NOTE — Assessment & Plan Note (Signed)
Under good control on current regimen. Continue current regimen. Continue to monitor. Call with any concerns. Refills up to date.   

## 2020-11-07 NOTE — Progress Notes (Signed)
BP 114/78   Pulse 82   Temp 97.8 F (36.6 C)   Wt 179 lb (81.2 kg)   SpO2 96%   BMI 32.48 kg/m    Subjective:    Patient ID: Monica Hull, female    DOB: 19-Sep-1960, 60 y.o.   MRN: [id number]  HPI: Monica Hull is a 60 y.o. female  Chief Complaint  Patient presents with  . COPD  . Diabetes   DIABETES Hypoglycemic episodes:no Polydipsia/polyuria: yes Visual disturbance: no Chest pain: no Paresthesias: yes  Glucose Monitoring: yes  Accucheck frequency: occasionally  Fasting glucose: 200s Taking Insulin?: no Blood Pressure Monitoring: not checking Retinal Examination: Up to Date Foot Exam: Up to Date Diabetic Education: Completed Pneumovax: Up to Date Influenza: Up to Date Aspirin: yes   COPD COPD status: stable Satisfied with current treatment?: yes Oxygen use: no Dyspnea frequency: with activity Cough frequency: most days Rescue inhaler frequency: 2x a day  Limitation of activity: yes Pneumovax: Up to Date Influenza: Up to Date   Relevant past medical, surgical, family and social history reviewed and updated as indicated. Interim medical history since our last visit reviewed. Allergies and medications reviewed and updated.  Review of Systems  Constitutional: Negative.   Respiratory: Negative.   Cardiovascular: Negative.   Gastrointestinal: Negative.   Musculoskeletal: Positive for arthralgias and myalgias. Negative for back pain, gait problem, joint swelling, neck pain and neck stiffness.  Neurological: Negative.   Psychiatric/Behavioral: Negative.     Per HPI unless specifically indicated above     Objective:    BP 114/78   Pulse 82   Temp 97.8 F (36.6 C)   Wt 179 lb (81.2 kg)   SpO2 96%   BMI 32.48 kg/m   Wt Readings from Last 3 Encounters:  11/07/20 179 lb (81.2 kg)  10/10/20 180 lb (81.6 kg)  08/05/20 182 lb 3.2 oz (82.6 kg)    Physical Exam Vitals and nursing note reviewed.  Constitutional:      General:  She is not in acute distress.    Appearance: Normal appearance. She is not ill-appearing, toxic-appearing or diaphoretic.  HENT:     Head: Normocephalic and atraumatic.     Right Ear: External ear normal.     Left Ear: External ear normal.     Nose: Nose normal.     Mouth/Throat:     Mouth: Mucous membranes are moist.     Pharynx: Oropharynx is clear.  Eyes:     General: No scleral icterus.       Right eye: No discharge.        Left eye: No discharge.     Extraocular Movements: Extraocular movements intact.     Conjunctiva/sclera: Conjunctivae normal.     Pupils: Pupils are equal, round, and reactive to light.  Cardiovascular:     Rate and Rhythm: Normal rate and regular rhythm.     Pulses: Normal pulses.     Heart sounds: Normal heart sounds. No murmur heard. No friction rub. No gallop.   Pulmonary:     Effort: Pulmonary effort is normal. No respiratory distress.     Breath sounds: Normal breath sounds. No stridor. No wheezing, rhonchi or rales.  Chest:     Chest wall: No tenderness.  Musculoskeletal:        General: Normal range of motion.     Cervical back: Normal range of motion and neck supple.  Skin:    General: Skin is warm and dry.  Capillary Refill: Capillary refill takes less than 2 seconds.     Coloration: Skin is not jaundiced or pale.     Findings: No bruising, erythema, lesion or rash.  Neurological:     General: No focal deficit present.     Mental Status: She is alert and oriented to person, place, and time. Mental status is at baseline.  Psychiatric:        Mood and Affect: Mood normal.        Behavior: Behavior normal.        Thought Content: Thought content normal.        Judgment: Judgment normal.     Results for orders placed or performed in visit on 08/05/20  CBC with Differential/Platelet  Result Value Ref Range   WBC 7.9 3.4 - 10.8 x10E3/uL   RBC 5.24 3.77 - 5.28 x10E6/uL   Hemoglobin 16.1 (H) 11.1 - 15.9 g/dL   Hematocrit 94.8 (H) 54.6 -  46.6 %   MCV 90 79 - 97 fL   MCH 30.7 26.6 - 33.0 pg   MCHC 34.0 31.5 - 35.7 g/dL   RDW 27.0 35.0 - 09.3 %   Platelets 219 150 - 450 x10E3/uL   Neutrophils 58 Not Estab. %   Lymphs 32 Not Estab. %   Monocytes 6 Not Estab. %   Eos 3 Not Estab. %   Basos 1 Not Estab. %   Neutrophils Absolute 4.6 1.4 - 7.0 x10E3/uL   Lymphocytes Absolute 2.5 0.7 - 3.1 x10E3/uL   Monocytes Absolute 0.5 0.1 - 0.9 x10E3/uL   EOS (ABSOLUTE) 0.3 0.0 - 0.4 x10E3/uL   Basophils Absolute 0.1 0.0 - 0.2 x10E3/uL   Immature Granulocytes 0 Not Estab. %   Immature Grans (Abs) 0.0 0.0 - 0.1 x10E3/uL  Comprehensive metabolic panel  Result Value Ref Range   Glucose 239 (H) 65 - 99 mg/dL   BUN 7 (L) 8 - 27 mg/dL   Creatinine, Ser 8.18 0.57 - 1.00 mg/dL   GFR calc non Af Amer 94 >59 mL/min/1.73   GFR calc Af Amer 109 >59 mL/min/1.73   BUN/Creatinine Ratio 10 (L) 12 - 28   Sodium 140 134 - 144 mmol/L   Potassium 3.9 3.5 - 5.2 mmol/L   Chloride 100 96 - 106 mmol/L   CO2 23 20 - 29 mmol/L   Calcium 9.5 8.7 - 10.3 mg/dL   Total Protein 7.0 6.0 - 8.5 g/dL   Albumin 4.2 3.8 - 4.9 g/dL   Globulin, Total 2.8 1.5 - 4.5 g/dL   Albumin/Globulin Ratio 1.5 1.2 - 2.2   Bilirubin Total 0.6 0.0 - 1.2 mg/dL   Alkaline Phosphatase 145 (H) 44 - 121 IU/L   AST 36 0 - 40 IU/L   ALT 34 (H) 0 - 32 IU/L  Lipid Panel w/o Chol/HDL Ratio  Result Value Ref Range   Cholesterol, Total 150 100 - 199 mg/dL   Triglycerides 299 0 - 149 mg/dL   HDL 44 >37 mg/dL   VLDL Cholesterol Cal 26 5 - 40 mg/dL   LDL Chol Calc (NIH) 80 0 - 99 mg/dL  Urinalysis, Routine w reflex microscopic  Result Value Ref Range   Specific Gravity, UA 1.020 1.005 - 1.030   pH, UA 6.5 5.0 - 7.5   Color, UA Yellow Yellow   Appearance Ur Clear Clear   Leukocytes,UA Negative Negative   Protein,UA Negative Negative/Trace   Glucose, UA 3+ (A) Negative   Ketones, UA Negative Negative   RBC,  UA Negative Negative   Bilirubin, UA Negative Negative   Urobilinogen, Ur  1.0 0.2 - 1.0 mg/dL   Nitrite, UA Negative Negative  TSH  Result Value Ref Range   TSH 1.120 0.450 - 4.500 uIU/mL  Bayer DCA Hb A1c Waived  Result Value Ref Range   HB A1C (BAYER DCA - WAIVED) 8.3 (H) <7.0 %  Microalbumin, Urine Waived  Result Value Ref Range   Microalb, Ur Waived 80 (H) 0 - 19 mg/L   Creatinine, Urine Waived 200 10 - 300 mg/dL   Microalb/Creat Ratio 30-300 (H) <30 mg/g  Cytology - PAP  Result Value Ref Range   High risk HPV Negative    Adequacy      Satisfactory for evaluation; transformation zone component ABSENT.   Diagnosis      - Negative for intraepithelial lesion or malignancy (NILM)   Comment Normal Reference Range HPV - Negative       Assessment & Plan:   Problem List Items Addressed This Visit      Respiratory   COPD (chronic obstructive pulmonary disease) (HCC)    Under good control on current regimen. Continue current regimen. Continue to monitor. Call with any concerns. Refills up to date.          Endocrine   Diet-controlled diabetes mellitus (HCC) - Primary     Not doing well with a1c of 10.3 up from 8.3- will increase metformin to 1000mg  BID and add jardiance. Recheck  76months. Call with any concerns.       Relevant Medications   metFORMIN (GLUCOPHAGE-XR) 500 MG 24 hr tablet   empagliflozin (JARDIANCE) 25 MG TABS tablet   Other Relevant Orders   Bayer DCA Hb A1c Waived    Other Visit Diagnoses    Chronic pain of both shoulders       Referral back to ortho made today. Call with any concerns.    Relevant Orders   Ambulatory referral to Orthopedic Surgery       Follow up plan: Return in about 3 months (around 02/07/2021) for physical.

## 2020-11-25 DIAGNOSIS — M7541 Impingement syndrome of right shoulder: Secondary | ICD-10-CM | POA: Diagnosis not present

## 2020-11-25 DIAGNOSIS — M25511 Pain in right shoulder: Secondary | ICD-10-CM | POA: Diagnosis not present

## 2020-11-25 DIAGNOSIS — M19011 Primary osteoarthritis, right shoulder: Secondary | ICD-10-CM | POA: Diagnosis not present

## 2020-11-25 DIAGNOSIS — M25512 Pain in left shoulder: Secondary | ICD-10-CM | POA: Diagnosis not present

## 2020-12-07 DIAGNOSIS — M19012 Primary osteoarthritis, left shoulder: Secondary | ICD-10-CM | POA: Diagnosis not present

## 2020-12-07 DIAGNOSIS — G8929 Other chronic pain: Secondary | ICD-10-CM | POA: Diagnosis not present

## 2020-12-07 DIAGNOSIS — M25512 Pain in left shoulder: Secondary | ICD-10-CM | POA: Diagnosis not present

## 2020-12-16 DIAGNOSIS — M25511 Pain in right shoulder: Secondary | ICD-10-CM | POA: Diagnosis not present

## 2020-12-16 DIAGNOSIS — G8929 Other chronic pain: Secondary | ICD-10-CM | POA: Diagnosis not present

## 2020-12-16 DIAGNOSIS — M19011 Primary osteoarthritis, right shoulder: Secondary | ICD-10-CM | POA: Diagnosis not present

## 2020-12-20 ENCOUNTER — Other Ambulatory Visit: Payer: Self-pay | Admitting: Family Medicine

## 2021-01-05 ENCOUNTER — Telehealth (INDEPENDENT_AMBULATORY_CARE_PROVIDER_SITE_OTHER): Payer: BC Managed Care – PPO | Admitting: Internal Medicine

## 2021-01-05 ENCOUNTER — Encounter: Payer: Self-pay | Admitting: Internal Medicine

## 2021-01-05 ENCOUNTER — Encounter: Payer: Self-pay | Admitting: Family Medicine

## 2021-01-05 DIAGNOSIS — K0889 Other specified disorders of teeth and supporting structures: Secondary | ICD-10-CM | POA: Diagnosis not present

## 2021-01-05 MED ORDER — DICLOFENAC SODIUM 50 MG PO TBEC: 50.0000 mg | DELAYED_RELEASE_TABLET | Freq: Two times a day (BID) | ORAL | 0 refills | Status: DC

## 2021-01-05 MED ORDER — AMOXICILLIN-POT CLAVULANATE 500-125 MG PO TABS: 1.0000 | ORAL_TABLET | Freq: Two times a day (BID) | ORAL | 0 refills | Status: AC

## 2021-01-05 NOTE — Progress Notes (Signed)
There were no vitals taken for this visit.   Subjective:    Patient ID: Monica Hull, female    DOB: February 05, 1961, 60 y.o.   MRN: [id number]  Chief Complaint  Patient presents with   Telehealth Consent        Dental Pain    Left side for the past 2 days.     HPI: Monica Hull is a 60 y.o. female   This visit was completed via video visit through MyChart due to the restrictions of the COVID-19 pandemic. All issues as above were discussed and addressed. Physical exam was done as above through visual confirmation on video through MyChart. If it was felt that the patient should be evaluated in the office, they were directed there. The patient verbally consented to this visit. Location of the patient: work Location of the provider: home Those involved with this call:  Provider: Loura Pardon, MD CMA: Tristan Schroeder, CMA Front Desk/Registration: Beverely Pace  Time spent on call: 10 minutes with patient face to face via video conference. More than 50% of this time was spent in counseling and coordination of care.  total spent in review of patient's record and preparation of their chart.    Dental Pain  This is a new problem. The current episode started in the past 7 days. The problem has been gradually worsening. The pain is at a severity of 6/10. The pain is moderate. Associated symptoms include facial pain. Pertinent negatives include no fever or sinus pressure.   Chief Complaint  Patient presents with   Telehealth Consent        Dental Pain    Left side for the past 2 days.     Relevant past medical, surgical, family and social history reviewed and updated as indicated. Interim medical history since our last visit reviewed. Allergies and medications reviewed and updated.  Review of Systems  Constitutional:  Negative for fever.  HENT:  Negative for sinus pressure.    Per HPI unless specifically indicated above     Objective:    There were  no vitals taken for this visit.  Wt Readings from Last 3 Encounters:  11/07/20 179 lb (81.2 kg)  10/10/20 180 lb (81.6 kg)  08/05/20 182 lb 3.2 oz (82.6 kg)    Physical Exam  Unable to peform sec to virtual visit.   Results for orders placed or performed in visit on 11/07/20  Bayer DCA Hb A1c Waived  Result Value Ref Range   HB A1C (BAYER DCA - WAIVED) 10.2 (H) <7.0 %        Current Outpatient Medications:    albuterol (PROVENTIL) (2.5 MG/3ML) 0.083% nebulizer solution, Take 3 mLs (2.5 mg total) by nebulization every 6 (six) hours as needed for wheezing or shortness of breath., Disp: 150 mL, Rfl: 1   albuterol (VENTOLIN HFA) 108 (90 Base) MCG/ACT inhaler, Inhale 2 puffs into the lungs every 6 (six) hours as needed for wheezing or shortness of breath., Disp: 18 g, Rfl: 6   aspirin EC 81 MG tablet, Take 81 mg by mouth daily. Swallow whole., Disp: , Rfl:    atorvastatin (LIPITOR) 80 MG tablet, Take 1 tablet (80 mg total) by mouth daily., Disp: 90 tablet, Rfl: 3   budesonide-formoterol (SYMBICORT) 160-4.5 MCG/ACT inhaler, Inhale 2 puffs into the lungs 2 (two) times daily., Disp: 3 Inhaler, Rfl: 3   cetirizine (ZYRTEC) 10 MG tablet, Take 1 tablet (10 mg total) by mouth daily., Disp: 90 tablet,  Rfl: 4   diclofenac (VOLTAREN) 50 MG EC tablet, Take 1 tablet (50 mg total) by mouth 4 (four) times daily., Disp: 120 tablet, Rfl: 6   empagliflozin (JARDIANCE) 25 MG TABS tablet, Take 1 tablet (25 mg total) by mouth daily before breakfast., Disp: 30 tablet, Rfl: 3   fesoterodine (TOVIAZ) 8 MG TB24 tablet, Take 1 tablet (8 mg total) by mouth daily., Disp: 90 tablet, Rfl: 3   fluticasone (FLONASE) 50 MCG/ACT nasal spray, Place 2 sprays into both nostrils daily., Disp: 16 g, Rfl: 11   lisinopril (ZESTRIL) 30 MG tablet, Take 1 tablet (30 mg total) by mouth daily., Disp: 90 tablet, Rfl: 3   metFORMIN (GLUCOPHAGE-XR) 500 MG 24 hr tablet, Take 2 tablets (1,000 mg total) by mouth 2 (two) times daily with a  meal., Disp: 360 tablet, Rfl: 1   nystatin (MYCOSTATIN) 100000 UNIT/ML suspension, Take 5 mLs (500,000 Units total) by mouth 4 (four) times daily., Disp: 60 mL, Rfl: 0   pantoprazole (PROTONIX) 40 MG tablet, TAKE 1 TABLET BY MOUTH EVERY MORNING, Disp: 90 tablet, Rfl: 1   Spacer/Aero-Holding Chambers (AEROCHAMBER MV) inhaler, Use as instructed, Disp: 1 each, Rfl: 0   SPIRIVA RESPIMAT 2.5 MCG/ACT AERS, INHALE 2 PUFFS INTO THE LUNGS DAILY, Disp: 12 g, Rfl: 0   tiZANidine (ZANAFLEX) 4 MG capsule, Take 4 mg by mouth 3 (three) times daily as needed for muscle spasms. Reported on 07/11/2015, Disp: , Rfl:    triamcinolone cream (KENALOG) 0.1 %, Apply 1 application topically 2 (two) times daily., Disp: 30 g, Rfl: 0    Assessment & Plan:  Tooth abscess / pain :  Will rx with augmentin  Is a second infection in < 2 months will need to fu with dentist asap pt verbalised understanding and will do this asap Diclofenac for pain   Problem List Items Addressed This Visit   None    No orders of the defined types were placed in this encounter.    Meds ordered this encounter  Medications   amoxicillin-clavulanate (AUGMENTIN) 500-125 MG tablet    Sig: Take 1 tablet (500 mg total) by mouth 2 (two) times daily for 5 days.    Dispense:  10 tablet    Refill:  0   diclofenac (VOLTAREN) 50 MG EC tablet    Sig: Take 1 tablet (50 mg total) by mouth 2 (two) times daily for 7 days.    Dispense:  14 tablet    Refill:  0     Follow up plan: No follow-ups on file.

## 2021-02-06 NOTE — Progress Notes (Signed)
BP (!) 149/92   Pulse 67   Temp 98.1 F (36.7 C) (Oral)   Ht 5' 2.24" (1.581 m)   Wt 176 lb 12.8 oz (80.2 kg)   SpO2 97%   BMI 32.08 kg/m    Subjective:    Patient ID: Monica Hull, female    DOB: December 25, 1960, 60 y.o.   MRN: [id number]  HPI: Akita Maxim is a 60 y.o. female presenting on 02/07/2021 for comprehensive medical examination. Current medical complaints include:  DIABETES Hypoglycemic episodes:no Polydipsia/polyuria: no Visual disturbance: no Chest pain: no Paresthesias: yes Glucose Monitoring: no Taking Insulin?: no Blood Pressure Monitoring: not checking Retinal Examination: Up to Date Foot Exam: Up to Date Diabetic Education: Completed Pneumovax: Up to Date Influenza: Up to Date Aspirin: no  HYPERTENSION / HYPERLIPIDEMIA Satisfied with current treatment? yes Duration of hypertension: chronic BP monitoring frequency: not checking BP medication side effects: no Past BP meds: lisinopril Duration of hyperlipidemia: chronic Cholesterol medication side effects: no Cholesterol supplements: none Past cholesterol medications: atorvastatin Medication compliance: excellent compliance Aspirin: no Recent stressors: no Recurrent headaches: no Visual changes: no Palpitations: no Dyspnea: yes Chest pain: no Lower extremity edema: no Dizzy/lightheaded: no  COPD COPD status: stable Satisfied with current treatment?: yes Oxygen use: no Dyspnea frequency: all the time Cough frequency: several times a day Rescue inhaler frequency:  daily Limitation of activity: yes Pneumovax: Up to Date Influenza: Up to Date  She currently lives with: husband and kids Menopausal Symptoms: no  Depression Screen done today and results listed below:  Depression screen [facility] 2/9 02/07/2021 01/05/2021 08/05/2020 04/26/2020 11/12/2019  Decreased Interest 0 0 0 0 0  Down, Depressed, Hopeless 0 0 0 0 0  PHQ - 2 Score 0 0 0 0 0  Altered sleeping 0 - - 2 2   Tired, decreased energy 0 - - 0 3  Change in appetite 0 - - 0 0  Feeling bad or failure about yourself  0 - - 0 0  Trouble concentrating 0 - - 0 0  Moving slowly or fidgety/restless 0 - - 0 0  Suicidal thoughts - - - 0 0  PHQ-9 Score 0 - - 2 5  Difficult doing work/chores Not difficult at all - - Not difficult at all Not difficult at all    Past Medical History:  Past Medical History:  Diagnosis Date   Asthma    Carpal tunnel syndrome    Chronic kidney disease    H/O KIDNEY STONES   COPD (chronic obstructive pulmonary disease) (HCC)    Diabetes mellitus without complication (HCC)    DIET CONTROLLED   Elevated liver function tests    GERD (gastroesophageal reflux disease)    Headache    MIGRAINES   Hyperlipidemia    Hypertension    Impaired fasting glucose    Sleep apnea    Not using CPAP- referred to sleep center   Tobacco abuse     Surgical History:  Past Surgical History:  Procedure Laterality Date   CARPAL TUNNEL RELEASE     CESAREAN SECTION     X2   HYSTEROSCOPY WITH D & C N/A 07/11/2015   Procedure: DILATATION AND CURETTAGE /HYSTEROSCOPY;  Surgeon: Hildred Laser, MD;  Location: [facility] ORS;  Service: Gynecology;  Laterality: N/A;   PALATE / UVULA BIOPSY / EXCISION      Medications:  Current Outpatient Medications on File Prior to Visit  Medication Sig   albuterol (PROVENTIL) (2.5 MG/3ML) 0.083% nebulizer solution Take  3 mLs (2.5 mg total) by nebulization every 6 (six) hours as needed for wheezing or shortness of breath.   albuterol (VENTOLIN HFA) 108 (90 Base) MCG/ACT inhaler Inhale 2 puffs into the lungs every 6 (six) hours as needed for wheezing or shortness of breath.   aspirin EC 81 MG tablet Take 81 mg by mouth daily. Swallow whole.   atorvastatin (LIPITOR) 80 MG tablet Take 1 tablet (80 mg total) by mouth daily.   budesonide-formoterol (SYMBICORT) 160-4.5 MCG/ACT inhaler Inhale 2 puffs into the lungs 2 (two) times daily.   fesoterodine (TOVIAZ) 8 MG TB24  tablet Take 1 tablet (8 mg total) by mouth daily.   fluticasone (FLONASE) 50 MCG/ACT nasal spray Place 2 sprays into both nostrils daily.   lisinopril (ZESTRIL) 30 MG tablet Take 1 tablet (30 mg total) by mouth daily.   metFORMIN (GLUCOPHAGE-XR) 500 MG 24 hr tablet Take 2 tablets (1,000 mg total) by mouth 2 (two) times daily with a meal.   nystatin (MYCOSTATIN) 100000 UNIT/ML suspension Take 5 mLs (500,000 Units total) by mouth 4 (four) times daily.   pantoprazole (PROTONIX) 40 MG tablet TAKE 1 TABLET BY MOUTH EVERY MORNING   Spacer/Aero-Holding Chambers (AEROCHAMBER MV) inhaler Use as instructed   SPIRIVA RESPIMAT 2.5 MCG/ACT AERS INHALE 2 PUFFS INTO THE LUNGS DAILY   tiZANidine (ZANAFLEX) 4 MG capsule Take 4 mg by mouth 3 (three) times daily as needed for muscle spasms. Reported on 07/11/2015   triamcinolone cream (KENALOG) 0.1 % Apply 1 application topically 2 (two) times daily.   No current facility-administered medications on file prior to visit.    Allergies:  Allergies  Allergen Reactions   Advair Hfa [Fluticasone-Salmeterol] Rash    Rash and thrush   Anoro Ellipta [Umeclidinium-Vilanterol] Rash    Causes patients tongue to break out   Biaxin [Clarithromycin] Nausea And Vomiting and Rash   Fluticasone Rash    Social History:  Social History   Socioeconomic History   Marital status: Married    Spouse name: Not on file   Number of children: Not on file   Years of education: Not on file   Highest education level: Not on file  Occupational History   Not on file  Tobacco Use   Smoking status: Every Day    Packs/day: 1.50    Years: 39.00    Pack years: 58.50    Types: Cigarettes   Smokeless tobacco: Never  Vaping Use   Vaping Use: Never used  Substance and Sexual Activity   Alcohol use: No    Alcohol/week: 0.0 standard drinks   Drug use: No   Sexual activity: Not Currently    Partners: Male  Other Topics Concern   Not on file  Social History Narrative   Not on  file   Social Determinants of Health   Financial Resource Strain: Not on file  Food Insecurity: Not on file  Transportation Needs: Not on file  Physical Activity: Not on file  Stress: Not on file  Social Connections: Not on file  Intimate Partner Violence: Not on file   Social History   Tobacco Use  Smoking Status Every Day   Packs/day: 1.50   Years: 39.00   Pack years: 58.50   Types: Cigarettes  Smokeless Tobacco Never   Social History   Substance and Sexual Activity  Alcohol Use No   Alcohol/week: 0.0 standard drinks    Family History:  Family History  Problem Relation Age of Onset   Arthritis Mother  Asthma Mother    Diabetes Mother    Heart disease Mother    Hyperlipidemia Mother    Hypertension Mother    Kidney disease Mother    Lung disease Mother    Pancreatic cancer Mother    Alcohol abuse Father    Hypertension Father    Hyperlipidemia Father    Diabetes Brother    Breast cancer Maternal Aunt 22   Breast cancer Maternal Aunt 55    Past medical history, surgical history, medications, allergies, family history and social history reviewed with patient today and changes made to appropriate areas of the chart.   Review of Systems  Constitutional: Negative.   HENT: Negative.    Eyes: Negative.   Respiratory:  Positive for cough, shortness of breath and wheezing. Negative for hemoptysis and sputum production.   Cardiovascular:  Positive for palpitations. Negative for chest pain, orthopnea, claudication, leg swelling and PND.  Gastrointestinal:  Positive for blood in stool, constipation and diarrhea. Negative for abdominal pain, heartburn, melena, nausea and vomiting.  Genitourinary: Negative.   Musculoskeletal:  Positive for joint pain. Negative for back pain, falls, myalgias and neck pain.  Skin: Negative.   Neurological:  Positive for tingling. Negative for dizziness, tremors, sensory change, speech change, focal weakness, seizures, loss of  consciousness, weakness and headaches.  Endo/Heme/Allergies:  Positive for polydipsia. Negative for environmental allergies. Does not bruise/bleed easily.  Psychiatric/Behavioral: Negative.    All other ROS negative except what is listed above and in the HPI.      Objective:    BP (!) 149/92   Pulse 67   Temp 98.1 F (36.7 C) (Oral)   Ht 5' 2.24" (1.581 m)   Wt 176 lb 12.8 oz (80.2 kg)   SpO2 97%   BMI 32.08 kg/m   Wt Readings from Last 3 Encounters:  02/07/21 176 lb 12.8 oz (80.2 kg)  11/07/20 179 lb (81.2 kg)  10/10/20 180 lb (81.6 kg)    Physical Exam Vitals and nursing note reviewed.  Constitutional:      General: She is not in acute distress.    Appearance: Normal appearance. She is not ill-appearing, toxic-appearing or diaphoretic.  HENT:     Head: Normocephalic and atraumatic.     Right Ear: Tympanic membrane, ear canal and external ear normal. There is no impacted cerumen.     Left Ear: Tympanic membrane, ear canal and external ear normal. There is no impacted cerumen.     Nose: Nose normal. No congestion or rhinorrhea.     Mouth/Throat:     Mouth: Mucous membranes are moist.     Pharynx: Oropharynx is clear. No oropharyngeal exudate or posterior oropharyngeal erythema.  Eyes:     General: No scleral icterus.       Right eye: No discharge.        Left eye: No discharge.     Extraocular Movements: Extraocular movements intact.     Conjunctiva/sclera: Conjunctivae normal.     Pupils: Pupils are equal, round, and reactive to light.  Neck:     Vascular: No carotid bruit.  Cardiovascular:     Rate and Rhythm: Normal rate and regular rhythm.     Pulses: Normal pulses.     Heart sounds: No murmur heard.   No friction rub. No gallop.  Pulmonary:     Effort: Pulmonary effort is normal. No respiratory distress.     Breath sounds: No stridor. Rhonchi present. No wheezing or rales.  Chest:  Chest wall: No tenderness.  Abdominal:     General: Abdomen is flat.  Bowel sounds are normal. There is no distension.     Palpations: Abdomen is soft. There is no mass.     Tenderness: There is no abdominal tenderness. There is no right CVA tenderness, left CVA tenderness, guarding or rebound.     Hernia: No hernia is present.  Genitourinary:    Comments: Breast and pelvic exams deferred with shared decision making Musculoskeletal:        General: No swelling, tenderness, deformity or signs of injury.     Cervical back: Normal range of motion and neck supple. No rigidity. No muscular tenderness.     Right lower leg: No edema.     Left lower leg: No edema.  Lymphadenopathy:     Cervical: No cervical adenopathy.  Skin:    General: Skin is warm and dry.     Capillary Refill: Capillary refill takes less than 2 seconds.     Coloration: Skin is not jaundiced or pale.     Findings: No bruising, erythema, lesion or rash.  Neurological:     General: No focal deficit present.     Mental Status: She is alert and oriented to person, place, and time. Mental status is at baseline.     Cranial Nerves: No cranial nerve deficit.     Sensory: No sensory deficit.     Motor: No weakness.     Coordination: Coordination normal.     Gait: Gait normal.     Deep Tendon Reflexes: Reflexes normal.  Psychiatric:        Mood and Affect: Mood normal.        Behavior: Behavior normal.        Thought Content: Thought content normal.        Judgment: Judgment normal.    Results for orders placed or performed in visit on 02/07/21  Urinalysis, Routine w reflex microscopic  Result Value Ref Range   Specific Gravity, UA 1.020 1.005 - 1.030   pH, UA 7.0 5.0 - 7.5   Color, UA Yellow Yellow   Appearance Ur Clear Clear   Leukocytes,UA Negative Negative   Protein,UA Negative Negative/Trace   Glucose, UA Negative Negative   Ketones, UA Negative Negative   RBC, UA Negative Negative   Bilirubin, UA Negative Negative   Urobilinogen, Ur 0.2 0.2 - 1.0 mg/dL   Nitrite, UA Negative  Negative  Bayer DCA Hb A1c Waived  Result Value Ref Range   HB A1C (BAYER DCA - WAIVED) 8.2 (H) <7.0 %  Microalbumin, Urine Waived  Result Value Ref Range   Microalb, Ur Waived 30 (H) 0 - 19 mg/L   Creatinine, Urine Waived 200 10 - 300 mg/dL   Microalb/Creat Ratio <30 <30 mg/g      Assessment & Plan:   Problem List Items Addressed This Visit       Cardiovascular and Mediastinum   Hypertension    Under good control on current regimen. Continue current regimen. Continue to monitor. Call with any concerns. Refills given. Labs drawn today.       Relevant Orders   CBC with Differential/Platelet   Comprehensive metabolic panel   Microalbumin, Urine Waived (Completed)   Aortic atherosclerosis (HCC)    Will keep BP, sugars and cholesterol under good control. Continue to monitor.       Relevant Orders   CBC with Differential/Platelet   Comprehensive metabolic panel   Lipid Panel w/o Chol/HDL Ratio  Respiratory   COPD (chronic obstructive pulmonary disease) (HCC)    Would like to switch to combo med- will check what's covered and let us know. Stable. Continue to follow with pulmonology.      Relevant Medications   cetirizine (ZYRTEC) 10 MG tablet     Digestive   GERD (gastroesophageal reflux disease)    Under good control on current regimen. Continue current regimen. Continue to monitor. Call with any concerns. Refills given. Labs drawn today.         Endocrine   Diet-controlled diabetes mellitus (HCC)    Better with a1c down to 8.2 from 10.2, but still running high. Will start trulicty and continue metformin and jardiance. Recheck 3 months. Call with any concerns.       Relevant Medications   empagliflozin (JARDIANCE) 25 MG TABS tablet   Dulaglutide (TRULICITY) 0.75 MG/0.5ML SOPN   Other Relevant Orders   CBC with Differential/Platelet   Comprehensive metabolic panel   Urinalysis, Routine w reflex microscopic (Completed)   Bayer DCA Hb A1c Waived (Completed)    Microalbumin, Urine Waived (Completed)   Thyroid nodule    Rechecking labs today. Await results. Treat as needed.       Relevant Orders   CBC with Differential/Platelet   Comprehensive metabolic panel   TSH     Other   Hyperlipidemia    Under good control on current regimen. Continue current regimen. Continue to monitor. Call with any concerns. Refills given. Labs drawn today.       Relevant Orders   CBC with Differential/Platelet   Comprehensive metabolic panel   Lipid Panel w/o Chol/HDL Ratio   Other Visit Diagnoses     Routine general medical examination at a health care facility    -  Primary   Vaccines up to date. Screening labs checked today. Pap up to date. Mammo and colonoscopy ordered. Continue diet and exercise. Call with any concerns.    Screening for colon cancer       Due for colonoscopy- referral generated today.   Relevant Orders   Ambulatory referral to Gastroenterology   Rectal bleeding       Due for colonoscopy- referral generated today.   Relevant Orders   Ambulatory referral to Gastroenterology   Encounter for screening mammogram for malignant neoplasm of breast       Mammogram ordered today.   Relevant Orders   MM 3D SCREEN BREAST BILATERAL        Follow up plan: Return in about 3 months (around 05/10/2021).   LABORATORY TESTING:  - Pap smear: up to date  IMMUNIZATIONS:   - Tdap: Tetanus vaccination status reviewed: last tetanus booster within 10 years. - Influenza: Postponed to flu season - Pneumovax: Up to date - Prevnar: Not applicable - COVID: Up to date - Shingrix vaccine: Administered today  SCREENING: -Mammogram: Ordered today  - Colonoscopy: Ordered today  - Bone Density: Not applicable   PATIENT COUNSELING:   Advised to take 1 mg of folate supplement per day if capable of pregnancy.   Sexuality: Discussed sexually transmitted diseases, partner selection, use of condoms, avoidance of unintended pregnancy  and contraceptive  alternatives.   Advised to avoid cigarette smoking.  I discussed with the patient that most people either abstain from alcohol or drink within safe limits (<=14/week and <=4 drinks/occasion for males, <=7/weeks and <= 3 drinks/occasion for females) and that the risk for alcohol disorders and other health effects rises proportionally with the number of drinks per  week and how often a drinker exceeds daily limits.  Discussed cessation/primary prevention of drug use and availability of treatment for abuse.   Diet: Encouraged to adjust caloric intake to maintain  or achieve ideal body weight, to reduce intake of dietary saturated fat and total fat, to limit sodium intake by avoiding high sodium foods and not adding table salt, and to maintain adequate dietary potassium and calcium preferably from fresh fruits, vegetables, and low-fat dairy products.    stressed the importance of regular exercise  Injury prevention: Discussed safety belts, safety helmets, smoke detector, smoking near bedding or upholstery.   Dental health: Discussed importance of regular tooth brushing, flossing, and dental visits.    NEXT PREVENTATIVE PHYSICAL DUE IN 1 YEAR. Return in about 3 months (around 05/10/2021).

## 2021-02-07 ENCOUNTER — Other Ambulatory Visit: Payer: Self-pay | Admitting: Family Medicine

## 2021-02-07 ENCOUNTER — Ambulatory Visit (INDEPENDENT_AMBULATORY_CARE_PROVIDER_SITE_OTHER): Payer: BC Managed Care – PPO | Admitting: Family Medicine

## 2021-02-07 ENCOUNTER — Encounter: Payer: Self-pay | Admitting: Family Medicine

## 2021-02-07 ENCOUNTER — Other Ambulatory Visit: Payer: Self-pay

## 2021-02-07 VITALS — BP 149/92 | HR 67 | Temp 98.1°F | Ht 62.24 in | Wt 176.8 lb

## 2021-02-07 DIAGNOSIS — Z23 Encounter for immunization: Secondary | ICD-10-CM | POA: Diagnosis not present

## 2021-02-07 DIAGNOSIS — Z0000 Encounter for general adult medical examination without abnormal findings: Secondary | ICD-10-CM | POA: Diagnosis not present

## 2021-02-07 DIAGNOSIS — E041 Nontoxic single thyroid nodule: Secondary | ICD-10-CM | POA: Diagnosis not present

## 2021-02-07 DIAGNOSIS — E119 Type 2 diabetes mellitus without complications: Secondary | ICD-10-CM

## 2021-02-07 DIAGNOSIS — I10 Essential (primary) hypertension: Secondary | ICD-10-CM | POA: Diagnosis not present

## 2021-02-07 DIAGNOSIS — Z1211 Encounter for screening for malignant neoplasm of colon: Secondary | ICD-10-CM

## 2021-02-07 DIAGNOSIS — J418 Mixed simple and mucopurulent chronic bronchitis: Secondary | ICD-10-CM

## 2021-02-07 DIAGNOSIS — E782 Mixed hyperlipidemia: Secondary | ICD-10-CM | POA: Diagnosis not present

## 2021-02-07 DIAGNOSIS — K219 Gastro-esophageal reflux disease without esophagitis: Secondary | ICD-10-CM

## 2021-02-07 DIAGNOSIS — Z1231 Encounter for screening mammogram for malignant neoplasm of breast: Secondary | ICD-10-CM

## 2021-02-07 DIAGNOSIS — I700 Atherosclerosis of aorta: Secondary | ICD-10-CM | POA: Diagnosis not present

## 2021-02-07 DIAGNOSIS — K625 Hemorrhage of anus and rectum: Secondary | ICD-10-CM

## 2021-02-07 LAB — URINALYSIS, ROUTINE W REFLEX MICROSCOPIC
Bilirubin, UA: NEGATIVE
Glucose, UA: NEGATIVE
Ketones, UA: NEGATIVE
Leukocytes,UA: NEGATIVE
Nitrite, UA: NEGATIVE
Protein,UA: NEGATIVE
RBC, UA: NEGATIVE
Specific Gravity, UA: 1.02 (ref 1.005–1.030)
Urobilinogen, Ur: 0.2 mg/dL (ref 0.2–1.0)
pH, UA: 7 (ref 5.0–7.5)

## 2021-02-07 LAB — BAYER DCA HB A1C WAIVED: HB A1C (BAYER DCA - WAIVED): 8.2 % — ABNORMAL HIGH (ref <7.0)

## 2021-02-07 LAB — MICROALBUMIN, URINE WAIVED
Creatinine, Urine Waived: 200 mg/dL (ref 10–300)
Microalb, Ur Waived: 30 mg/L — ABNORMAL HIGH (ref 0–19)
Microalb/Creat Ratio: <30 mg/g (ref <30)

## 2021-02-07 MED ORDER — TRELEGY ELLIPTA 100-62.5-25 MCG/INH IN AEPB: 1.0000 | INHALATION_SPRAY | Freq: Every day | RESPIRATORY_TRACT | 4 refills | Status: DC

## 2021-02-07 MED ORDER — EMPAGLIFLOZIN 25 MG PO TABS: 25.0000 mg | ORAL_TABLET | Freq: Every day | ORAL | 1 refills | Status: DC

## 2021-02-07 MED ORDER — TRULICITY 0.75 MG/0.5ML ~~LOC~~ SOAJ: 0.7500 mg | SUBCUTANEOUS | 3 refills | Status: AC

## 2021-02-07 MED ORDER — CETIRIZINE HCL 10 MG PO TABS: 10.0000 mg | ORAL_TABLET | Freq: Every day | ORAL | 4 refills | Status: DC

## 2021-02-07 NOTE — Assessment & Plan Note (Signed)
Under good control on current regimen. Continue current regimen. Continue to monitor. Call with any concerns. Refills given. Labs drawn today.   

## 2021-02-07 NOTE — Assessment & Plan Note (Signed)
Rechecking labs today. Await results. Treat as needed.  °

## 2021-02-07 NOTE — Assessment & Plan Note (Signed)
Will keep BP, sugars and cholesterol under good control. Continue to monitor.  

## 2021-02-07 NOTE — Assessment & Plan Note (Signed)
Would like to switch to combo med- will check what's covered and let us know. Stable. Continue to follow with pulmonology.

## 2021-02-07 NOTE — Assessment & Plan Note (Signed)
Better with a1c down to 8.2 from 10.2, but still running high. Will start trulicty and continue metformin and jardiance. Recheck 3 months. Call with any concerns.

## 2021-02-11 LAB — LIPID PANEL W/O CHOL/HDL RATIO
Cholesterol, Total: 169 mg/dL (ref 100–199)
HDL: 41 mg/dL (ref >39)
LDL Chol Calc (NIH): 100 mg/dL — ABNORMAL HIGH (ref 0–99)
Triglycerides: 157 mg/dL — ABNORMAL HIGH (ref 0–149)
VLDL Cholesterol Cal: 28 mg/dL (ref 5–40)

## 2021-02-11 LAB — CBC WITH DIFFERENTIAL/PLATELET
Basophils Absolute: 0.1 10*3/uL (ref 0.0–0.2)
Basos: 2 %
EOS (ABSOLUTE): 0.4 10*3/uL (ref 0.0–0.4)
Eos: 6 %
Hematocrit: 51.1 % — ABNORMAL HIGH (ref 34.0–46.6)
Hemoglobin: 16.6 g/dL — ABNORMAL HIGH (ref 11.1–15.9)
Immature Grans (Abs): 0 10*3/uL (ref 0.0–0.1)
Immature Granulocytes: 0 %
Lymphocytes Absolute: 2.9 10*3/uL (ref 0.7–3.1)
Lymphs: 41 %
MCH: 30.7 pg (ref 26.6–33.0)
MCHC: 32.5 g/dL (ref 31.5–35.7)
MCV: 95 fL (ref 79–97)
Monocytes Absolute: 0.3 10*3/uL (ref 0.1–0.9)
Monocytes: 5 %
Neutrophils Absolute: 3.3 10*3/uL (ref 1.4–7.0)
Neutrophils: 46 %
Platelets: 261 10*3/uL (ref 150–450)
RBC: 5.41 x10E6/uL — ABNORMAL HIGH (ref 3.77–5.28)
RDW: 12.9 % (ref 11.7–15.4)
WBC: 7 10*3/uL (ref 3.4–10.8)

## 2021-02-11 LAB — COMPREHENSIVE METABOLIC PANEL
ALT: 31 IU/L (ref 0–32)
AST: 29 IU/L (ref 0–40)
Albumin/Globulin Ratio: 1.5 (ref 1.2–2.2)
Albumin: 4.1 g/dL (ref 3.8–4.8)
Alkaline Phosphatase: 157 IU/L — ABNORMAL HIGH (ref 44–121)
BUN/Creatinine Ratio: 10 — ABNORMAL LOW (ref 12–28)
BUN: 7 mg/dL — ABNORMAL LOW (ref 8–27)
Bilirubin Total: 0.6 mg/dL (ref 0.0–1.2)
CO2: 25 mmol/L (ref 20–29)
Calcium: 9.3 mg/dL (ref 8.7–10.3)
Chloride: 102 mmol/L (ref 96–106)
Creatinine, Ser: 0.68 mg/dL (ref 0.57–1.00)
Globulin, Total: 2.8 g/dL (ref 1.5–4.5)
Glucose: 160 mg/dL — ABNORMAL HIGH (ref 65–99)
Potassium: 4 mmol/L (ref 3.5–5.2)
Sodium: 142 mmol/L (ref 134–144)
Total Protein: 6.9 g/dL (ref 6.0–8.5)
eGFR: 99 mL/min/{1.73_m2} (ref >59)

## 2021-02-11 LAB — TSH: TSH: 1.79 u[IU]/mL (ref 0.450–4.500)

## 2021-04-05 ENCOUNTER — Ambulatory Visit: Payer: BC Managed Care – PPO | Admitting: Gastroenterology

## 2021-04-06 ENCOUNTER — Encounter: Payer: Self-pay | Admitting: Family Medicine

## 2021-04-25 ENCOUNTER — Ambulatory Visit: Payer: BC Managed Care – PPO | Admitting: Family Medicine

## 2021-04-25 ENCOUNTER — Other Ambulatory Visit: Payer: Self-pay

## 2021-04-25 ENCOUNTER — Encounter: Payer: Self-pay | Admitting: Family Medicine

## 2021-04-25 VITALS — BP 162/96 | HR 83 | Temp 98.2°F | Wt 176.0 lb

## 2021-04-25 DIAGNOSIS — I10 Essential (primary) hypertension: Secondary | ICD-10-CM | POA: Diagnosis not present

## 2021-04-25 DIAGNOSIS — Z23 Encounter for immunization: Secondary | ICD-10-CM | POA: Diagnosis not present

## 2021-04-25 DIAGNOSIS — J441 Chronic obstructive pulmonary disease with (acute) exacerbation: Secondary | ICD-10-CM | POA: Diagnosis not present

## 2021-04-25 MED ORDER — PREDNISONE 10 MG PO TABS: ORAL_TABLET | ORAL | 0 refills | Status: DC

## 2021-04-25 MED ORDER — ALBUTEROL SULFATE (2.5 MG/3ML) 0.083% IN NEBU
2.5000 mg | INHALATION_SOLUTION | Freq: Once | RESPIRATORY_TRACT | Status: AC
  Administered 2021-04-25: 2.5 mg via RESPIRATORY_TRACT

## 2021-04-25 MED ORDER — BREZTRI AEROSPHERE 160-9-4.8 MCG/ACT IN AERO: 2.0000 | INHALATION_SPRAY | Freq: Two times a day (BID) | RESPIRATORY_TRACT | 0 refills | Status: DC

## 2021-04-25 MED ORDER — LISINOPRIL 40 MG PO TABS: 40.0000 mg | ORAL_TABLET | Freq: Every day | ORAL | 1 refills | Status: DC

## 2021-04-25 MED ORDER — AZITHROMYCIN 250 MG PO TABS: ORAL_TABLET | ORAL | 0 refills | Status: AC

## 2021-04-25 NOTE — Progress Notes (Signed)
BP (!) 162/96   Pulse 83   Temp 98.2 F (36.8 C)   Wt 176 lb (79.8 kg)   SpO2 96%   BMI 31.94 kg/m    Subjective:    Patient ID: Monica Hull, female    DOB: 17-Dec-1960, 60 y.o.   MRN: [id number]  HPI: Monica Hull is a 60 y.o. female  Chief Complaint  Patient presents with   Cough    Patient states she has had cough for a few weeks as well as wheezing    Has not been into see the pulmonogist in about about year.  COUGH Duration: about a month or so Circumstances of initial development of cough: unknown Cough severity: moderate Cough description: non-productive, hacking, and irritating Aggravating factors:  worse at night Alleviating factors: nebulizer Status:  stable Treatments attempted: albuterol Wheezing: yes Shortness of breath: yes Chest pain: yes Chest tightness:yes Nasal congestion: no Runny nose: no Postnasal drip: no Frequent throat clearing or swallowing: no Hemoptysis: no Fevers: no Night sweats: no Weight loss: no Heartburn: no Recent foreign travel: no Tuberculosis contacts: no  HYPERTENSION Hypertension status: uncontrolled  Satisfied with current treatment? no Duration of hypertension: chronic BP monitoring frequency:  not checking BP medication side effects:  no Medication compliance: excellent compliance Aspirin: yes Recurrent headaches: no Visual changes: no Palpitations: no Dyspnea: yes Chest pain: no Lower extremity edema: no Dizzy/lightheaded: no   Relevant past medical, surgical, family and social history reviewed and updated as indicated. Interim medical history since our last visit reviewed. Allergies and medications reviewed and updated.  Review of Systems  Constitutional:  Positive for fatigue. Negative for activity change, appetite change, chills, diaphoresis, fever and unexpected weight change.  HENT: Negative.    Respiratory:  Positive for cough, chest tightness, shortness of breath and  wheezing. Negative for apnea, choking and stridor.   Cardiovascular: Negative.   Gastrointestinal: Negative.   Musculoskeletal: Negative.   Neurological: Negative.   Psychiatric/Behavioral: Negative.     Per HPI unless specifically indicated above     Objective:    BP (!) 162/96   Pulse 83   Temp 98.2 F (36.8 C)   Wt 176 lb (79.8 kg)   SpO2 96%   BMI 31.94 kg/m   Wt Readings from Last 3 Encounters:  04/25/21 176 lb (79.8 kg)  02/07/21 176 lb 12.8 oz (80.2 kg)  11/07/20 179 lb (81.2 kg)    Physical Exam Vitals and nursing note reviewed.  Constitutional:      General: She is not in acute distress.    Appearance: Normal appearance. She is not ill-appearing, toxic-appearing or diaphoretic.  HENT:     Head: Normocephalic and atraumatic.     Right Ear: External ear normal.     Left Ear: External ear normal.     Nose: Nose normal.     Mouth/Throat:     Mouth: Mucous membranes are moist.     Pharynx: Oropharynx is clear.  Eyes:     General: No scleral icterus.       Right eye: No discharge.        Left eye: No discharge.     Extraocular Movements: Extraocular movements intact.     Conjunctiva/sclera: Conjunctivae normal.     Pupils: Pupils are equal, round, and reactive to light.  Cardiovascular:     Rate and Rhythm: Normal rate and regular rhythm.     Pulses: Normal pulses.     Heart sounds: Normal heart sounds. No  murmur heard.   No friction rub. No gallop.  Pulmonary:     Effort: Pulmonary effort is normal. No respiratory distress.     Breath sounds: No stridor. Wheezing and rhonchi present. No rales.  Chest:     Chest wall: No tenderness.  Musculoskeletal:        General: Normal range of motion.     Cervical back: Normal range of motion and neck supple.  Skin:    General: Skin is warm and dry.     Capillary Refill: Capillary refill takes less than 2 seconds.     Coloration: Skin is not jaundiced or pale.     Findings: No bruising, erythema, lesion or rash.   Neurological:     General: No focal deficit present.     Mental Status: She is alert and oriented to person, place, and time. Mental status is at baseline.  Psychiatric:        Mood and Affect: Mood normal.        Behavior: Behavior normal.        Thought Content: Thought content normal.        Judgment: Judgment normal.    Results for orders placed or performed in visit on 02/07/21  CBC with Differential/Platelet  Result Value Ref Range   WBC 7.0 3.4 - 10.8 x10E3/uL   RBC 5.41 (H) 3.77 - 5.28 x10E6/uL   Hemoglobin 16.6 (H) 11.1 - 15.9 g/dL   Hematocrit 51.1 (H) 34.0 - 46.6 %   MCV 95 79 - 97 fL   MCH 30.7 26.6 - 33.0 pg   MCHC 32.5 31.5 - 35.7 g/dL   RDW 12.9 11.7 - 15.4 %   Platelets 261 150 - 450 x10E3/uL   Neutrophils 46 Not Estab. %   Lymphs 41 Not Estab. %   Monocytes 5 Not Estab. %   Eos 6 Not Estab. %   Basos 2 Not Estab. %   Neutrophils Absolute 3.3 1.4 - 7.0 x10E3/uL   Lymphocytes Absolute 2.9 0.7 - 3.1 x10E3/uL   Monocytes Absolute 0.3 0.1 - 0.9 x10E3/uL   EOS (ABSOLUTE) 0.4 0.0 - 0.4 x10E3/uL   Basophils Absolute 0.1 0.0 - 0.2 x10E3/uL   Immature Granulocytes 0 Not Estab. %   Immature Grans (Abs) 0.0 0.0 - 0.1 x10E3/uL  Comprehensive metabolic panel  Result Value Ref Range   Glucose 160 (H) 65 - 99 mg/dL   BUN 7 (L) 8 - 27 mg/dL   Creatinine, Ser 0.68 0.57 - 1.00 mg/dL   eGFR 99 >59 mL/min/1.73   BUN/Creatinine Ratio 10 (L) 12 - 28   Sodium 142 134 - 144 mmol/L   Potassium 4.0 3.5 - 5.2 mmol/L   Chloride 102 96 - 106 mmol/L   CO2 25 20 - 29 mmol/L   Calcium 9.3 8.7 - 10.3 mg/dL   Total Protein 6.9 6.0 - 8.5 g/dL   Albumin 4.1 3.8 - 4.8 g/dL   Globulin, Total 2.8 1.5 - 4.5 g/dL   Albumin/Globulin Ratio 1.5 1.2 - 2.2   Bilirubin Total 0.6 0.0 - 1.2 mg/dL   Alkaline Phosphatase 157 (H) 44 - 121 IU/L   AST 29 0 - 40 IU/L   ALT 31 0 - 32 IU/L  Lipid Panel w/o Chol/HDL Ratio  Result Value Ref Range   Cholesterol, Total 169 100 - 199 mg/dL    Triglycerides 157 (H) 0 - 149 mg/dL   HDL 41 >39 mg/dL   VLDL Cholesterol Cal 28 5 - 40 mg/dL  LDL Chol Calc (NIH) 100 (H) 0 - 99 mg/dL  Urinalysis, Routine w reflex microscopic  Result Value Ref Range   Specific Gravity, UA 1.020 1.005 - 1.030   pH, UA 7.0 5.0 - 7.5   Color, UA Yellow Yellow   Appearance Ur Clear Clear   Leukocytes,UA Negative Negative   Protein,UA Negative Negative/Trace   Glucose, UA Negative Negative   Ketones, UA Negative Negative   RBC, UA Negative Negative   Bilirubin, UA Negative Negative   Urobilinogen, Ur 0.2 0.2 - 1.0 mg/dL   Nitrite, UA Negative Negative  TSH  Result Value Ref Range   TSH 1.790 0.450 - 4.500 uIU/mL  Bayer DCA Hb A1c Waived  Result Value Ref Range   HB A1C (BAYER DCA - WAIVED) 8.2 (H) <7.0 %  Microalbumin, Urine Waived  Result Value Ref Range   Microalb, Ur Waived 30 (H) 0 - 19 mg/L   Creatinine, Urine Waived 200 10 - 300 mg/dL   Microalb/Creat Ratio <30 <30 mg/g      Assessment & Plan:   Problem List Items Addressed This Visit       Cardiovascular and Mediastinum   Hypertension    Has been running high. Will increase her lisinopril to 82m and recheck at follow up in 2 weeks.       Relevant Medications   lisinopril (ZESTRIL) 40 MG tablet   Other Visit Diagnoses     [place]    -  Primary   In exacerbation. Will treat with prednisone and azithromycin. Will get her back into pulmonogist. Try Breztri- see how she does in 2 weeks. Call w/ concerns.    Relevant Medications   SPIRIVA RESPIMAT 2.5 MCG/ACT AERS   SYMBICORT 160-4.5 MCG/ACT inhaler   albuterol (PROVENTIL) (2.5 MG/3ML) 0.083% nebulizer solution 2.5 mg (Completed)   predniSONE (DELTASONE) 10 MG tablet   Budeson-Glycopyrrol-Formoterol (BREZTRI AEROSPHERE) 160-9-4.8 MCG/ACT AERO   azithromycin (ZITHROMAX) 250 MG tablet        Follow up plan: Return As scheduled.

## 2021-04-25 NOTE — Patient Instructions (Addendum)
You have an appointment to see Monica Dura, NP at [facility] in [place] on 05/10/21 at 12:00 PM. Their address is [address] [place], [place] [address] and their phone number is [phone].

## 2021-04-25 NOTE — Assessment & Plan Note (Signed)
Has been running high. Will increase her lisinopril to 40mg  and recheck at follow up in 2 weeks.

## 2021-04-27 ENCOUNTER — Other Ambulatory Visit: Payer: Self-pay | Admitting: Internal Medicine

## 2021-04-28 NOTE — Telephone Encounter (Signed)
Requested medications are due for refill today NO  Requested medications are on the active medication list NO  Last visit 01/05/21  Notes to clinic This med is not on current med list and it was ordered for two weeks only, please assess.  Requested Prescriptions  Pending Prescriptions Disp Refills   diclofenac (VOLTAREN) 50 MG EC tablet [Pharmacy Med Name: DICLOFENAC SODIUM 50MG  DR TABLETS] 14 tablet 0    Sig: TAKE 1 TABLET(50 MG) BY MOUTH TWICE DAILY FOR 7 DAYS     Analgesics:  NSAIDS Failed - 04/27/2021  4:08 PM      Failed - HGB in normal range and within 360 days    Hemoglobin  Date Value Ref Range Status  02/07/2021 16.6 (H) 11.1 - 15.9 g/dL Final          Passed - Cr in normal range and within 360 days    Creatinine, Ser  Date Value Ref Range Status  02/07/2021 0.68 0.57 - 1.00 mg/dL Final          Passed - Patient is not pregnant      Passed - Valid encounter within last 12 months    Recent Outpatient Visits           3 days ago COPD exacerbation (HCC)   [facility] [place], Megan P, DO   2 months ago Routine general medical examination at a health care facility   [facility], Megan P, DO   3 months ago Tooth pain   [facility] [place], Avanti, MD   5 months ago Diet-controlled diabetes mellitus [facility])   [facility] [place], Megan P, DO   6 months ago Chronic obstructive pulmonary disease with acute exacerbation [facility])   [facility], DO       Future Appointments             In 1 week Dorcas Carrow, NP [facility] [place]   In 1 week Glenford Bayley, Laural Benes, DO [facility]

## 2021-05-03 ENCOUNTER — Encounter: Payer: Self-pay | Admitting: Family Medicine

## 2021-05-08 ENCOUNTER — Other Ambulatory Visit: Payer: Self-pay | Admitting: Internal Medicine

## 2021-05-09 NOTE — Telephone Encounter (Signed)
Requested medication (s) are due for refill today:   Not on the med list  Requested medication (s) are on the active medication list:   No  Future visit scheduled:   Yes in 2 days with Dr. Laural Benes   Last ordered: Not on her medication list.  This is a duplicate request from pharmacy.   It was refused a few days ago.   Requested Prescriptions  Pending Prescriptions Disp Refills   diclofenac (VOLTAREN) 50 MG EC tablet [Pharmacy Med Name: DICLOFENAC SODIUM 50MG  DR TABLETS] 14 tablet 0    Sig: TAKE 1 TABLET(50 MG) BY MOUTH TWICE DAILY FOR 7 DAYS     Analgesics:  NSAIDS Failed - 05/08/2021  3:53 PM      Failed - HGB in normal range and within 360 days    Hemoglobin  Date Value Ref Range Status  02/07/2021 16.6 (H) 11.1 - 15.9 g/dL Final          Passed - Cr in normal range and within 360 days    Creatinine, Ser  Date Value Ref Range Status  02/07/2021 0.68 0.57 - 1.00 mg/dL Final          Passed - Patient is not pregnant      Passed - Valid encounter within last 12 months    Recent Outpatient Visits           2 weeks ago COPD exacerbation (HCC)   [facility] [place], Megan P, DO   3 months ago Routine general medical examination at a health care facility   [facility], Megan P, DO   4 months ago Tooth pain   [facility] [place], Avanti, MD   6 months ago Diet-controlled diabetes mellitus [facility])   [facility] [place], Megan P, DO   7 months ago Chronic obstructive pulmonary disease with acute exacerbation (HCC)   [facility] [place], [place], DO       Future Appointments             Tomorrow Penn yan, NP [facility]    In 2 days Glenford Bayley, Laural Benes, DO [facility]

## 2021-05-10 ENCOUNTER — Telehealth: Payer: Self-pay | Admitting: Primary Care

## 2021-05-10 ENCOUNTER — Ambulatory Visit: Payer: BC Managed Care – PPO | Admitting: Primary Care

## 2021-05-10 ENCOUNTER — Other Ambulatory Visit: Payer: Self-pay

## 2021-05-10 ENCOUNTER — Encounter: Payer: Self-pay | Admitting: Primary Care

## 2021-05-10 VITALS — BP 140/100 | HR 89 | Temp 97.8°F | Ht 62.0 in | Wt 177.6 lb

## 2021-05-10 DIAGNOSIS — J418 Mixed simple and mucopurulent chronic bronchitis: Secondary | ICD-10-CM | POA: Diagnosis not present

## 2021-05-10 DIAGNOSIS — F17210 Nicotine dependence, cigarettes, uncomplicated: Secondary | ICD-10-CM

## 2021-05-10 DIAGNOSIS — F17200 Nicotine dependence, unspecified, uncomplicated: Secondary | ICD-10-CM | POA: Diagnosis not present

## 2021-05-10 DIAGNOSIS — Z87891 Personal history of nicotine dependence: Secondary | ICD-10-CM

## 2021-05-10 MED ORDER — DALIRESP 250 MCG PO TABS: 1.0000 | ORAL_TABLET | Freq: Every day | ORAL | 0 refills | Status: DC

## 2021-05-10 NOTE — Telephone Encounter (Signed)
Patient is apart of [place] lung cancer screening program, due for LDCT in October 2022.

## 2021-05-10 NOTE — Patient Instructions (Addendum)
Recommendations: - If no clear benefit from [facility] would stay on Symbicort 160 and Spiriva  - Take mucinex 600mg  twice daily as needed for chest congestion - Use flutter valve three times a day  - Start Daliresp daily x 4 week; then we will increase to daily if tolerating (this medication is used to decrease amount of COPD exacerbations. If medication not covered please notify ) - Continue to work on tapering amount you are smoking and pick quit date  - I have contacted lung cancer screening program to schedule CT chest   Orders: - Flutter valve   Follow-up: - 3 months with Dr. [place] or sooner if needed     Roflumilast oral tablets What is this medication? ROFLUMILAST (roe FLUE mi last) decreases inflammation in the lungs. This medicine is used to prevent COPD flare-ups. Do not use this medicine to treat sudden breathing problems. This medicine may be used for other purposes; ask your health care provider or pharmacist if you have questions. COMMON BRAND NAME(S): Daliresp What should I tell my care team before I take this medication? They need to know if you have any of these conditions: liver disease mental illness an unusual or allergic reaction to roflumilast, other medicines, foods, dyes, or preservatives pregnant or trying to get pregnant breast-feeding How should I use this medication? Take this medicine by mouth with a glass of water. Follow the directions on the prescription label. You can take it with or without food. If it upsets your stomach, take it with food. Take your medicine at regular intervals. Do not take it more often than directed. Do not stop taking except on your doctor's advice. A special MedGuide will be given to you by the pharmacist with each prescription and refill. Be sure to read this information carefully each time. Talk to your pediatrician regarding the use of this medicine in children. Special care may be needed. Overdosage: If  you think you have taken too much of this medicine contact a poison control center or emergency room at once. NOTE: This medicine is only for you. Do not share this medicine with others. What if I miss a dose? If you miss a dose, take it as soon as you can. If it is almost time for your next dose, take only that dose. Do not take double or extra doses. What may interact with this medication? carbamazepine cimetidine enoxacin erythromycin fluvoxamine ketoconazole phenobarbital phenytoin rifampicin St. John's wort This list may not describe all possible interactions. Give your health care provider a list of all the medicines, herbs, non-prescription drugs, or dietary supplements you use. Also tell them if you smoke, drink alcohol, or use illegal drugs. Some items may interact with your medicine. What should I watch for while using this medication? Visit your doctor for regular check ups. Tell your doctor or healthcare professional if your symptoms do not start to get better or if they get worse. What side effects may I notice from receiving this medication? Side effects that you should report to your doctor or health care professional as soon as possible: allergic reactions like skin rash, itching or hives, swelling of the face, lips, or tongue anxious breathing problems suicidal thoughts or other mood changes trouble sleeping weight loss Side effects that usually do not require medical attention (report to your doctor or health care professional if they continue or are bothersome): back pain diarrhea dizziness general ill feeling or flu-like symptoms headache loss of appetite nausea, vomiting This  list may not describe all possible side effects. Call your doctor for medical advice about side effects. You may report side effects to FDA at 1-800-FDA-1088. Where should I keep my medication? Keep out of the reach of children. Store at room temperature between 15 and 30 degrees C (59  and 86 degrees F). Throw away any unused medicine after the expiration date. NOTE: This sheet is a summary. It may not cover all possible information. If you have questions about this medicine, talk to your doctor, pharmacist, or health care provider.  2022 Elsevier/Gold Standard (2015-07-14 00:00:00)

## 2021-05-10 NOTE — Assessment & Plan Note (Addendum)
-   Recently seen by PCP for AECOPD, treated with Prednisone taper and Zpack. She is feeling better, still has a chronic cough with some wheezing. Advised she start mucinex 600mg  BID. We will also add flutter valve and Daliresp daily x 4 weeks (then increase daily if tolerates) to help decrease  Exacerbations. Continue Symbicort 160 + Spiriva. FU in 3 months or sooner if needed.

## 2021-05-10 NOTE — Assessment & Plan Note (Addendum)
-   Currents smoker. Encourage tapering amount and picking quit date. She is followed by lung cancer screening program, overdue for LDCT (we will reach out to program coordinator to make sure this gets scheduled).

## 2021-05-10 NOTE — Progress Notes (Signed)
@Patient  ID: , female    DOB: June 21, 1961, 60 y.o.   MRN: [id number]  No chief complaint on file.   Referring provider: [id number], DO  HPI: 60 year old female, current every day smoker. PMH significant for COPD, sleep apnea, allergic rhinitis, GERD, hypertension, carotid stenosis, diet controlled diabetes mellitus, hyperlipidemia. Patient of Dr. 77, last seen on 09/11/19. Maintained on Symbicort and Spiriva.   05/10/2021- Interim hx  Patient presents today for 6 month follow-up. She saw PCP 2 weeks ago for acute visit, she had symptoms cough and wheezing. She was given prednisone taper and zpack. She is feeling some better. She gets sick on average 3-4 times a year, no recent hospitalizations for COPD. She keeps a chronic cough, not currently productive. She is experiencing less wheezing. She is on trial breztri aerosphere but has not noticed any signifciant difference compared to Symbicort/spiriva.    Allergies  Allergen Reactions   Advair Hfa [Fluticasone-Salmeterol] Rash    Rash and thrush   Anoro Ellipta [Umeclidinium-Vilanterol] Rash    Causes patients tongue to break out   Biaxin [Clarithromycin] Nausea And Vomiting and Rash   Fluticasone Rash    Immunization History  Administered Date(s) Administered   Influenza,inj,Quad PF,6+ Mos 03/07/2015, 02/29/2016, 05/09/2017, 03/24/2018, 03/05/2019, 04/14/2020, 04/25/2021   Influenza-Unspecified 03/25/2014   PFIZER(Purple Top)SARS-COV-2 Vaccination 09/10/2019, 10/06/2019, 04/14/2020   Pneumococcal Polysaccharide-23 09/17/2014   Tdap 12/21/2014   Zoster Recombinat (Shingrix) 02/07/2021    Past Medical History:  Diagnosis Date   Asthma    Carpal tunnel syndrome    Chronic kidney disease    H/O KIDNEY STONES   COPD (chronic obstructive pulmonary disease) (HCC)    Diabetes mellitus without complication (HCC)    DIET CONTROLLED   Elevated liver function tests    GERD (gastroesophageal reflux  disease)    Headache    MIGRAINES   Hyperlipidemia    Hypertension    Impaired fasting glucose    Sleep apnea    Not using CPAP- referred to sleep center   Tobacco abuse     Tobacco History: Social History   Tobacco Use  Smoking Status Every Day   Packs/day: 1.50   Years: 39.00   Pack years: 58.50   Types: Cigarettes  Smokeless Tobacco Never   Ready to quit: Not Answered Counseling given: Not Answered   Outpatient Medications Prior to Visit  Medication Sig Dispense Refill   albuterol (PROVENTIL) (2.5 MG/3ML) 0.083% nebulizer solution Take 3 mLs (2.5 mg total) by nebulization every 6 (six) hours as needed for wheezing or shortness of breath. 150 mL 1   albuterol (VENTOLIN HFA) 108 (90 Base) MCG/ACT inhaler Inhale 2 puffs into the lungs every 6 (six) hours as needed for wheezing or shortness of breath. 18 g 6   aspirin EC 81 MG tablet Take 81 mg by mouth daily. Swallow whole.     atorvastatin (LIPITOR) 80 MG tablet Take 1 tablet (80 mg total) by mouth daily. 90 tablet 3   Budeson-Glycopyrrol-Formoterol (BREZTRI AEROSPHERE) 160-9-4.8 MCG/ACT AERO Inhale 2 puffs into the lungs 2 (two) times daily. 5.9 g 0   cetirizine (ZYRTEC) 10 MG tablet Take 1 tablet (10 mg total) by mouth daily. 90 tablet 4   diclofenac (VOLTAREN) 50 MG EC tablet TAKE 1 TABLET(50 MG) BY MOUTH TWICE DAILY FOR 7 DAYS 14 tablet 0   empagliflozin (JARDIANCE) 25 MG TABS tablet Take 1 tablet (25 mg total) by mouth daily before breakfast. 90 tablet 1  fesoterodine (TOVIAZ) 8 MG TB24 tablet Take 1 tablet (8 mg total) by mouth daily. 90 tablet 3   fluticasone (FLONASE) 50 MCG/ACT nasal spray Place 2 sprays into both nostrils daily. 16 g 11   lisinopril (ZESTRIL) 40 MG tablet Take 1 tablet (40 mg total) by mouth daily. 90 tablet 1   metFORMIN (GLUCOPHAGE-XR) 500 MG 24 hr tablet Take 2 tablets (1,000 mg total) by mouth 2 (two) times daily with a meal. 360 tablet 1   nystatin (MYCOSTATIN) 100000 UNIT/ML suspension Take 5  mLs (500,000 Units total) by mouth 4 (four) times daily. 60 mL 0   pantoprazole (PROTONIX) 40 MG tablet TAKE 1 TABLET BY MOUTH EVERY MORNING 90 tablet 1   predniSONE (DELTASONE) 10 MG tablet 6 tabs today and tomorrow, 5 tabs the next 2 days, decrease by 1 every other day until gone 42 tablet 0   Spacer/Aero-Holding Chambers (AEROCHAMBER MV) inhaler Use as instructed 1 each 0   SPIRIVA RESPIMAT 2.5 MCG/ACT AERS SMARTSIG:2 Puff(s) Via Inhaler Daily     SYMBICORT 160-4.5 MCG/ACT inhaler Inhale 2 puffs into the lungs 2 (two) times daily.     tiZANidine (ZANAFLEX) 4 MG capsule Take 4 mg by mouth 3 (three) times daily as needed for muscle spasms. Reported on 07/11/2015     triamcinolone cream (KENALOG) 0.1 % Apply 1 application topically 2 (two) times daily. 30 g 0   TRULICITY 0.75 MG/0.5ML SOPN Inject 0.75 mg into the skin once a week.     No facility-administered medications prior to visit.    Review of Systems  Review of Systems  Constitutional: Negative.   HENT: Negative.    Respiratory:  Positive for cough and wheezing. Negative for chest tightness and shortness of breath.     Physical Exam  BP (!) 140/100 (BP Location: Left Arm, Patient Position: Sitting, Cuff Size: Normal)   Pulse 89   Temp 97.8 F (36.6 C) (Oral)   Ht 5\' 2"  (1.575 m)   Wt 177 lb 9.6 oz (80.6 kg)   SpO2 97%   BMI 32.48 kg/m  Physical Exam Constitutional:      Appearance: Normal appearance.  HENT:     Head: Normocephalic and atraumatic.     Mouth/Throat:     Mouth: Mucous membranes are moist.     Pharynx: Oropharynx is clear.  Cardiovascular:     Rate and Rhythm: Normal rate and regular rhythm.  Pulmonary:     Breath sounds: Wheezing and rhonchi present.  Musculoskeletal:        General: Normal range of motion.  Skin:    General: Skin is warm and dry.  Neurological:     General: No focal deficit present.     Mental Status: She is alert and oriented to person, place, and time. Mental status is at  baseline.  Psychiatric:        Mood and Affect: Mood normal.        Behavior: Behavior normal.        Thought Content: Thought content normal.        Judgment: Judgment normal.     Lab Results:  CBC    Component Value Date/Time   WBC 7.0 02/07/2021 0911   WBC 9.1 07/06/2015 0929   RBC 5.41 (H) 02/07/2021 0911   RBC 5.27 (H) 07/06/2015 0929   HGB 16.6 (H) 02/07/2021 0911   HCT 51.1 (H) 02/07/2021 0911   PLT 261 02/07/2021 0911   MCV 95 02/07/2021 0911   MCH  30.7 02/07/2021 0911   MCH 30.1 07/06/2015 0929   MCHC 32.5 02/07/2021 0911   MCHC 33.8 07/06/2015 0929   RDW 12.9 02/07/2021 0911   LYMPHSABS 2.9 02/07/2021 0911   EOSABS 0.4 02/07/2021 0911   BASOSABS 0.1 02/07/2021 0911    BMET    Component Value Date/Time   NA 142 02/07/2021 0911   K 4.0 02/07/2021 0911   CL 102 02/07/2021 0911   CO2 25 02/07/2021 0911   GLUCOSE 160 (H) 02/07/2021 0911   GLUCOSE 287 (H) 07/18/2020 1106   BUN 7 (L) 02/07/2021 0911   CREATININE 0.68 02/07/2021 0911   CALCIUM 9.3 02/07/2021 0911   GFRNONAA 94 08/05/2020 1023   GFRNONAA >60 07/18/2020 1106   GFRAA 109 08/05/2020 1023    BNP No results found for: BNP  ProBNP No results found for: PROBNP  Imaging: No results found.   Assessment & Plan:   COPD (chronic obstructive pulmonary disease) - Recently seen by PCP for AECOPD, treated with Prednisone taper and Zpack. She is feeling better, still has a chronic cough with some wheezing. Advised she start mucinex 600mg  BID. We will also add flutter valve and Daliresp daily x 4 weeks (then increase daily if tolerates) to help decrease  Exacerbations. Continue Symbicort 160 + Spiriva. FU in 3 months or sooner if needed.   Current smoker - Currents smoker. Encourage tapering amount and picking quit date. She is followed by lung cancer screening program, overdue for LDCT (we will reach out to program coordinator to make sure this gets scheduled).    ,  NP 05/10/2021

## 2021-05-11 ENCOUNTER — Ambulatory Visit: Payer: BC Managed Care – PPO | Admitting: Family Medicine

## 2021-05-11 ENCOUNTER — Encounter: Payer: Self-pay | Admitting: Family Medicine

## 2021-05-11 ENCOUNTER — Telehealth: Payer: BC Managed Care – PPO

## 2021-05-11 ENCOUNTER — Other Ambulatory Visit: Payer: Self-pay

## 2021-05-11 ENCOUNTER — Other Ambulatory Visit (HOSPITAL_COMMUNITY): Payer: Self-pay

## 2021-05-11 VITALS — BP 118/77 | HR 74 | Temp 97.9°F | Wt 177.0 lb

## 2021-05-11 DIAGNOSIS — I10 Essential (primary) hypertension: Secondary | ICD-10-CM | POA: Diagnosis not present

## 2021-05-11 DIAGNOSIS — J4521 Mild intermittent asthma with (acute) exacerbation: Secondary | ICD-10-CM | POA: Diagnosis not present

## 2021-05-11 DIAGNOSIS — E782 Mixed hyperlipidemia: Secondary | ICD-10-CM

## 2021-05-11 DIAGNOSIS — E119 Type 2 diabetes mellitus without complications: Secondary | ICD-10-CM | POA: Diagnosis not present

## 2021-05-11 DIAGNOSIS — J418 Mixed simple and mucopurulent chronic bronchitis: Secondary | ICD-10-CM

## 2021-05-11 LAB — BAYER DCA HB A1C WAIVED: HB A1C (BAYER DCA - WAIVED): 6.6 % — ABNORMAL HIGH (ref 4.8–5.6)

## 2021-05-11 MED ORDER — ALBUTEROL SULFATE (2.5 MG/3ML) 0.083% IN NEBU: 2.5000 mg | INHALATION_SOLUTION | Freq: Four times a day (QID) | RESPIRATORY_TRACT | 1 refills | Status: DC | PRN

## 2021-05-11 MED ORDER — ALBUTEROL SULFATE HFA 108 (90 BASE) MCG/ACT IN AERS: 2.0000 | INHALATION_SPRAY | Freq: Four times a day (QID) | RESPIRATORY_TRACT | 6 refills | Status: DC | PRN

## 2021-05-11 MED ORDER — METFORMIN HCL ER 500 MG PO TB24: 1000.0000 mg | ORAL_TABLET | Freq: Two times a day (BID) | ORAL | 1 refills | Status: DC

## 2021-05-11 MED ORDER — PANTOPRAZOLE SODIUM 40 MG PO TBEC: 40.0000 mg | DELAYED_RELEASE_TABLET | Freq: Every morning | ORAL | 1 refills | Status: DC

## 2021-05-11 MED ORDER — DICLOFENAC SODIUM 1 % EX GEL: 4.0000 g | Freq: Four times a day (QID) | CUTANEOUS | 6 refills | Status: DC

## 2021-05-11 MED ORDER — TRULICITY 0.75 MG/0.5ML ~~LOC~~ SOAJ: 0.7500 mg | SUBCUTANEOUS | 1 refills | Status: DC

## 2021-05-11 NOTE — Telephone Encounter (Signed)
Patient Advocate Encounter   Received notification from [facility] that prior authorization for Roflumilast is required by his/her insurance.   PA submitted on 05/11/21 Key [id number]  Status is pending    [facility] will continue to follow:  Patient Advocate Fax:  [phone]

## 2021-05-11 NOTE — Assessment & Plan Note (Signed)
Stable. Continue to follow with pulmonology. Call with any concerns. Continue to monitor.  

## 2021-05-11 NOTE — Assessment & Plan Note (Signed)
Doing well with a1c of 6.6. Continue current regimen. Continue to monitor. Call with any concerns.

## 2021-05-11 NOTE — Assessment & Plan Note (Signed)
Under good control on current regimen. Continue current regimen. Continue to monitor. Call with any concerns. Refills given. Labs drawn today.   

## 2021-05-11 NOTE — Progress Notes (Signed)
BP 118/77   Pulse 74   Temp 97.9 F (36.6 C)   Wt 177 lb (80.3 kg)   SpO2 97%   BMI 32.37 kg/m    Subjective:    Patient ID: Monica Hull, female    DOB: 03/12/1961, 60 y.o.   MRN: [id number]  HPI: Monica Hull is a 60 y.o. female  Chief Complaint  Patient presents with   Hypertension   Diabetes   COPD   Still has a little cough and wheezing.   DIABETES Hypoglycemic episodes:no Polydipsia/polyuria: no Visual disturbance: no Chest pain: no Paresthesias: no Glucose Monitoring: no  Accucheck frequency: Not Checking Taking Insulin?: no  Long acting insulin:  Short acting insulin: Blood Pressure Monitoring: not checking Retinal Examination: Up to Date Foot Exam: Up to Date Diabetic Education: Completed Pneumovax: Up to Date Influenza: Up to Date Aspirin: yes  HYPERTENSION / HYPERLIPIDEMIA Satisfied with current treatment? yes Duration of hypertension: chronic BP monitoring frequency: not checking BP medication side effects: no Duration of hyperlipidemia: chronic Cholesterol medication side effects: no Cholesterol supplements: none Past cholesterol medications: atorvastatin Medication compliance: excellent compliance Aspirin: yes Recent stressors: no Recurrent headaches: no Visual changes: no Palpitations: no Dyspnea: no Chest pain: no Lower extremity edema: no Dizzy/lightheaded: no   Relevant past medical, surgical, family and social history reviewed and updated as indicated. Interim medical history since our last visit reviewed. Allergies and medications reviewed and updated.  Review of Systems  Constitutional: Negative.   Respiratory:  Positive for shortness of breath and wheezing. Negative for apnea, cough, choking, chest tightness and stridor.   Cardiovascular: Negative.   Gastrointestinal: Negative.   Musculoskeletal: Negative.   Psychiatric/Behavioral: Negative.     Per HPI unless specifically indicated above      Objective:    BP 118/77   Pulse 74   Temp 97.9 F (36.6 C)   Wt 177 lb (80.3 kg)   SpO2 97%   BMI 32.37 kg/m   Wt Readings from Last 3 Encounters:  05/11/21 177 lb (80.3 kg)  05/10/21 177 lb 9.6 oz (80.6 kg)  04/25/21 176 lb (79.8 kg)    Physical Exam Vitals and nursing note reviewed.  Constitutional:      General: She is not in acute distress.    Appearance: Normal appearance. She is not ill-appearing, toxic-appearing or diaphoretic.  HENT:     Head: Normocephalic and atraumatic.     Right Ear: External ear normal.     Left Ear: External ear normal.     Nose: Nose normal.     Mouth/Throat:     Mouth: Mucous membranes are moist.     Pharynx: Oropharynx is clear.  Eyes:     General: No scleral icterus.       Right eye: No discharge.        Left eye: No discharge.     Extraocular Movements: Extraocular movements intact.     Conjunctiva/sclera: Conjunctivae normal.     Pupils: Pupils are equal, round, and reactive to light.  Cardiovascular:     Rate and Rhythm: Normal rate and regular rhythm.     Pulses: Normal pulses.     Heart sounds: Normal heart sounds. No murmur heard.   No friction rub. No gallop.  Pulmonary:     Effort: Pulmonary effort is normal. No respiratory distress.     Breath sounds: Normal breath sounds. No stridor. No wheezing, rhonchi or rales.  Chest:     Chest wall: No tenderness.  Musculoskeletal:        General: Normal range of motion.     Cervical back: Normal range of motion and neck supple.  Skin:    General: Skin is warm and dry.     Capillary Refill: Capillary refill takes less than 2 seconds.     Coloration: Skin is not jaundiced or pale.     Findings: No bruising, erythema, lesion or rash.  Neurological:     General: No focal deficit present.     Mental Status: She is alert and oriented to person, place, and time. Mental status is at baseline.  Psychiatric:        Mood and Affect: Mood normal.        Behavior: Behavior normal.         Thought Content: Thought content normal.        Judgment: Judgment normal.    Results for orders placed or performed in visit on 05/11/21  Bayer DCA Hb A1c Waived  Result Value Ref Range   HB A1C (BAYER DCA - WAIVED) 6.6 (H) 4.8 - 5.6 %      Assessment & Plan:   Problem List Items Addressed This Visit       Cardiovascular and Mediastinum   Hypertension - Primary    Under good control on current regimen. Continue current regimen. Continue to monitor. Call with any concerns. Refills given. Labs drawn today.        Relevant Orders   Comprehensive metabolic panel     Respiratory   COPD (chronic obstructive pulmonary disease) (HCC)    Stable. Continue to follow with pulmonology. Call with any concerns. Continue to monitor.       Relevant Medications   albuterol (VENTOLIN HFA) 108 (90 Base) MCG/ACT inhaler   albuterol (PROVENTIL) (2.5 MG/3ML) 0.083% nebulizer solution   Other Relevant Orders   AMB Referral to [facility]     Endocrine   Diet-controlled diabetes mellitus ([place])    Doing well with a1c of 6.6. Continue current regimen. Continue to monitor. Call with any concerns.       Relevant Medications   TRULICITY [id number] SOPN   metFORMIN (GLUCOPHAGE-XR) 500 MG 24 hr tablet   Other Relevant Orders   Comprehensive metabolic panel   Bayer DCA Hb A1c Waived (Completed)     Other   Hyperlipidemia    Under good control on current regimen. Continue current regimen. Continue to monitor. Call with any concerns. Refills given. Labs drawn today.       Relevant Orders   Comprehensive metabolic panel   Lipid Panel w/o Chol/HDL Ratio     Follow up plan: Return in about 3 months (around 08/11/2021).

## 2021-05-11 NOTE — Progress Notes (Signed)
Agree with the details of the visit as noted by Elizabeth Walsh, NP.  C. Laura Jalexis Breed, MD [facility] 

## 2021-05-12 ENCOUNTER — Other Ambulatory Visit (HOSPITAL_COMMUNITY): Payer: Self-pay

## 2021-05-12 LAB — COMPREHENSIVE METABOLIC PANEL
ALT: 36 IU/L — ABNORMAL HIGH (ref 0–32)
AST: 26 IU/L (ref 0–40)
Albumin/Globulin Ratio: 1.6 (ref 1.2–2.2)
Albumin: 4.1 g/dL (ref 3.8–4.8)
Alkaline Phosphatase: 143 IU/L — ABNORMAL HIGH (ref 44–121)
BUN/Creatinine Ratio: 13 (ref 12–28)
BUN: 9 mg/dL (ref 8–27)
Bilirubin Total: 0.8 mg/dL (ref 0.0–1.2)
CO2: 23 mmol/L (ref 20–29)
Calcium: 9.4 mg/dL (ref 8.7–10.3)
Chloride: 102 mmol/L (ref 96–106)
Creatinine, Ser: 0.69 mg/dL (ref 0.57–1.00)
Globulin, Total: 2.6 g/dL (ref 1.5–4.5)
Glucose: 151 mg/dL — ABNORMAL HIGH (ref 70–99)
Potassium: 4 mmol/L (ref 3.5–5.2)
Sodium: 141 mmol/L (ref 134–144)
Total Protein: 6.7 g/dL (ref 6.0–8.5)
eGFR: 99 mL/min/{1.73_m2} (ref >59)

## 2021-05-12 LAB — LIPID PANEL W/O CHOL/HDL RATIO
Cholesterol, Total: 160 mg/dL (ref 100–199)
HDL: 51 mg/dL (ref >39)
LDL Chol Calc (NIH): 86 mg/dL (ref 0–99)
Triglycerides: 128 mg/dL (ref 0–149)
VLDL Cholesterol Cal: 23 mg/dL (ref 5–40)

## 2021-05-12 NOTE — Telephone Encounter (Signed)
Spoke with pt and scheduled yearly LDCt for 05/23/21 1:00. Pt verbalized understanding. Nothing further needed.

## 2021-05-15 ENCOUNTER — Other Ambulatory Visit (HOSPITAL_COMMUNITY): Payer: Self-pay

## 2021-05-16 ENCOUNTER — Other Ambulatory Visit (HOSPITAL_COMMUNITY): Payer: Self-pay

## 2021-05-17 ENCOUNTER — Other Ambulatory Visit (HOSPITAL_COMMUNITY): Payer: Self-pay

## 2021-05-17 NOTE — Telephone Encounter (Signed)
Renewed this Key: [id number]. It was cancelled.

## 2021-05-22 ENCOUNTER — Other Ambulatory Visit: Payer: Self-pay

## 2021-05-23 ENCOUNTER — Other Ambulatory Visit: Payer: Self-pay

## 2021-05-23 ENCOUNTER — Ambulatory Visit
Admission: RE | Admit: 2021-05-23 | Discharge: 2021-05-23 | Disposition: A | Discharge status: Home / Self Care | Payer: BC Managed Care – PPO | Source: Ambulatory Visit | Attending: Acute Care | Admitting: Acute Care

## 2021-05-23 DIAGNOSIS — F17210 Nicotine dependence, cigarettes, uncomplicated: Secondary | ICD-10-CM

## 2021-05-23 DIAGNOSIS — Z87891 Personal history of nicotine dependence: Secondary | ICD-10-CM | POA: Insufficient documentation

## 2021-05-29 ENCOUNTER — Other Ambulatory Visit: Payer: Self-pay | Admitting: Family Medicine

## 2021-05-29 NOTE — Telephone Encounter (Signed)
Medication Refill - Medication:SPIRIVA RESPIMAT 2.5 MCG/ACT AERS   Has the patient contacted their pharmacy? yes (Agent: If no, request that the patient contact the pharmacy for the refill. If patient does not wish to contact the pharmacy document the reason why and proceed with request.) (Agent: If yes, when and what did the pharmacy advise?)contact pcp  Preferred Pharmacy (with phone number or street name): [id number] [place], [place] - [address] AT [facility] [address]  Phone:  [phone]  Fax:  [phone]   Has the patient been seen for an appointment in the last year OR does the patient have an upcoming appointment? yes  Agent: Please be advised that RX refills may take up to 3 business days. We ask that you follow-up with your pharmacy.

## 2021-05-29 NOTE — Telephone Encounter (Signed)
Patient is requesting update on PA.   Pharmacy team, can you guys assist with this? Why was PA cancelled. Patient stated that Roflumilast requires PA

## 2021-05-29 NOTE — Telephone Encounter (Signed)
Requested medication (s) are due for refill today: Yes  Requested medication (s) are on the active medication list: Yes  Last refill:  03/19/21  Future visit scheduled: Yes  Notes to clinic:  Unable to refill per protocol, last refill by another provider. Historical med      Requested Prescriptions  Pending Prescriptions Disp Refills   SPIRIVA RESPIMAT 2.5 MCG/ACT AERS       Pulmonology:  Anticholinergic Agents Passed - 05/29/2021 10:22 AM      Passed - Valid encounter within last 12 months    Recent Outpatient Visits           2 weeks ago Primary hypertension   [facility] [place], Megan P, DO   1 month ago COPD exacerbation [facility])   [facility] [place], Megan P, DO   3 months ago Routine general medical examination at a health care facility   [facility], Megan P, DO   4 months ago Tooth pain   [facility] [place], Avanti, MD   6 months ago Diet-controlled diabetes mellitus [facility])   [facility] Dorcas Carrow, DO       Future Appointments             In 2 months Salena Saner, MD [facility] [place]   In 2 months Laural Benes, Oralia Rud, DO [facility]            cf

## 2021-05-30 ENCOUNTER — Other Ambulatory Visit (HOSPITAL_COMMUNITY): Payer: Self-pay

## 2021-05-30 NOTE — Telephone Encounter (Signed)
Spoke to patient and confirmed insurance information.  Patient stated that she is covered under her spouse, Monica Hull. Member ID: [id number]  Pharmacy team please advise. Thanks

## 2021-05-30 NOTE — Telephone Encounter (Signed)
Spoke to patient and requested Rx plan info.   Rx group:[id number] BIN: [id number] ID: [id number]  She could not locate PCN number.

## 2021-05-31 ENCOUNTER — Other Ambulatory Visit (HOSPITAL_COMMUNITY): Payer: Self-pay

## 2021-05-31 NOTE — Telephone Encounter (Signed)
Patient Advocate Encounter  Prior Authorization for Roflumilast tabs has been approved.    PA# [id number]  Effective dates: 05/01/2021 through 05/31/2022  Per Test Claim Patients co-pay is $10.   Spoke with Pharmacy to Process.  Patient Advocate Fax:  [phone]

## 2021-05-31 NOTE — Telephone Encounter (Signed)
Patient is aware of below message and voiced her understanding.  Nothing further needed at this time.   

## 2021-06-04 ENCOUNTER — Other Ambulatory Visit: Payer: Self-pay | Admitting: Family Medicine

## 2021-06-05 ENCOUNTER — Encounter: Payer: Self-pay | Admitting: Pulmonary Disease

## 2021-06-05 MED ORDER — SPIRIVA RESPIMAT 2.5 MCG/ACT IN AERS: INHALATION_SPRAY | RESPIRATORY_TRACT | 5 refills | Status: DC

## 2021-06-06 DIAGNOSIS — F17210 Nicotine dependence, cigarettes, uncomplicated: Secondary | ICD-10-CM | POA: Diagnosis not present

## 2021-06-06 DIAGNOSIS — M25512 Pain in left shoulder: Secondary | ICD-10-CM | POA: Diagnosis not present

## 2021-06-06 DIAGNOSIS — M19011 Primary osteoarthritis, right shoulder: Secondary | ICD-10-CM | POA: Diagnosis not present

## 2021-06-06 DIAGNOSIS — M25511 Pain in right shoulder: Secondary | ICD-10-CM | POA: Diagnosis not present

## 2021-06-06 DIAGNOSIS — M25519 Pain in unspecified shoulder: Secondary | ICD-10-CM | POA: Diagnosis not present

## 2021-06-06 DIAGNOSIS — M19012 Primary osteoarthritis, left shoulder: Secondary | ICD-10-CM | POA: Diagnosis not present

## 2021-06-12 ENCOUNTER — Telehealth (INDEPENDENT_AMBULATORY_CARE_PROVIDER_SITE_OTHER): Payer: BC Managed Care – PPO | Admitting: Internal Medicine

## 2021-06-12 ENCOUNTER — Encounter: Payer: Self-pay | Admitting: Internal Medicine

## 2021-06-12 DIAGNOSIS — J0100 Acute maxillary sinusitis, unspecified: Secondary | ICD-10-CM | POA: Insufficient documentation

## 2021-06-12 MED ORDER — BENZONATATE 100 MG PO CAPS: 100.0000 mg | ORAL_CAPSULE | Freq: Two times a day (BID) | ORAL | 0 refills | Status: AC | PRN

## 2021-06-12 MED ORDER — FEXOFENADINE HCL 180 MG PO TABS: 180.0000 mg | ORAL_TABLET | Freq: Every day | ORAL | 1 refills | Status: DC

## 2021-06-12 MED ORDER — AMOXICILLIN-POT CLAVULANATE 875-125 MG PO TABS: 1.0000 | ORAL_TABLET | Freq: Two times a day (BID) | ORAL | 0 refills | Status: AC

## 2021-06-12 NOTE — Progress Notes (Signed)
There were no vitals taken for this visit.   Subjective:    Patient ID: Monica Hull, female    DOB: [birth year], 60 y.o.   MRN: [id number]  Chief Complaint  Patient presents with   Nasal Congestion    Started about 2 to 3 days ago.    Cough   Headache    HPI: Monica Hull is a 60 y.o. female   This visit was completed via video visit through [facility] due to the restrictions of the COVID-19 pandemic. All issues as above were discussed and addressed. Physical exam was done as above through visual confirmation on video through MyChart. If it was felt that the patient should be evaluated in the office, they were directed there. The patient verbally consented to this visit. Location of the patient: home Location of the provider: work Those involved with this call:  Provider: Charlynne Cousins, MD CMA: Frazier Butt, [place] Desk/Registration: [employer]  Time spent on call: 10 minutes with patient face to face via video conference. More than 50% of this time was spent in counseling and coordination of care. 10 minutes total spent in review of patient's record and preparation of their chart.  Started x 2 days ago, has a cough with this -non productive  No fever or chills.  Cough This is a new problem. The current episode started 1 to 4 weeks ago. The problem has been gradually worsening. The cough is Non-productive. Associated symptoms include headaches, nasal congestion and postnasal drip. Pertinent negatives include no chest pain, chills, ear congestion, ear pain, fever, heartburn, hemoptysis, myalgias or rash.  Headache  Associated symptoms include coughing. Pertinent negatives include no ear pain or fever.   Chief Complaint  Patient presents with   Nasal Congestion    Started about 2 to 3 days ago.    Cough   Headache    Relevant past medical, surgical, family and social history reviewed and updated as indicated. Interim medical history since our last  visit reviewed. Allergies and medications reviewed and updated.  Review of Systems  Constitutional:  Negative for chills and fever.  HENT:  Positive for postnasal drip. Negative for ear pain.   Respiratory:  Positive for cough. Negative for hemoptysis.   Cardiovascular:  Negative for chest pain.  Gastrointestinal:  Negative for heartburn.  Musculoskeletal:  Negative for myalgias.  Skin:  Negative for rash.  Neurological:  Positive for headaches.   Per HPI unless specifically indicated above     Objective:    There were no vitals taken for this visit.  Wt Readings from Last 3 Encounters:  05/23/21 171 lb (77.6 kg)  05/11/21 177 lb (80.3 kg)  05/10/21 177 lb 9.6 oz (80.6 kg)    Physical Exam Constitutional:      General: She is not in acute distress.    Appearance: She is well-developed and normal weight. She is not ill-appearing, toxic-appearing or diaphoretic.  Neurological:     Mental Status: She is alert.  Psychiatric:        Mood and Affect: Mood is not anxious or depressed.        Behavior: Behavior is not agitated.        Cognition and Memory: Cognition is not impaired.    Results for orders placed or performed in visit on 05/11/21  Comprehensive metabolic panel  Result Value Ref Range   Glucose 151 (H) 70 - 99 mg/dL   BUN 9 8 - 27 mg/dL   Creatinine,  Ser 0.69 0.57 - 1.00 mg/dL   eGFR 99 >59 mL/min/1.73   BUN/Creatinine Ratio 13 12 - 28   Sodium 141 134 - 144 mmol/L   Potassium 4.0 3.5 - 5.2 mmol/L   Chloride 102 96 - 106 mmol/L   CO2 23 20 - 29 mmol/L   Calcium 9.4 8.7 - 10.3 mg/dL   Total Protein 6.7 6.0 - 8.5 g/dL   Albumin 4.1 3.8 - 4.8 g/dL   Globulin, Total 2.6 1.5 - 4.5 g/dL   Albumin/Globulin Ratio 1.6 1.2 - 2.2   Bilirubin Total 0.8 0.0 - 1.2 mg/dL   Alkaline Phosphatase 143 (H) 44 - 121 IU/L   AST 26 0 - 40 IU/L   ALT 36 (H) 0 - 32 IU/L  Bayer DCA Hb A1c Waived  Result Value Ref Range   HB A1C (BAYER DCA - WAIVED) 6.6 (H) 4.8 - 5.6 %  Lipid  Panel w/o Chol/HDL Ratio  Result Value Ref Range   Cholesterol, Total 160 100 - 199 mg/dL   Triglycerides 128 0 - 149 mg/dL   HDL 51 >39 mg/dL   VLDL Cholesterol Cal 23 5 - 40 mg/dL   LDL Chol Calc (NIH) 86 0 - 99 mg/dL        Current Outpatient Medications:    albuterol (VENTOLIN HFA) 108 (90 Base) MCG/ACT inhaler, Inhale 2 puffs into the lungs every 6 (six) hours as needed for wheezing or shortness of breath., Disp: 18 g, Rfl: 6   amoxicillin-clavulanate (AUGMENTIN) 875-125 MG tablet, Take 1 tablet by mouth 2 (two) times daily for 10 days., Disp: 20 tablet, Rfl: 0   aspirin EC 81 MG tablet, Take 81 mg by mouth daily. Swallow whole., Disp: , Rfl:    atorvastatin (LIPITOR) 80 MG tablet, Take 1 tablet (80 mg total) by mouth daily., Disp: 90 tablet, Rfl: 3   benzonatate (TESSALON) 100 MG capsule, Take 1 capsule (100 mg total) by mouth 2 (two) times daily as needed for up to 20 days for cough., Disp: 20 capsule, Rfl: 0   diclofenac Sodium (VOLTAREN) 1 % GEL, Apply 4 g topically 4 (four) times daily., Disp: 350 g, Rfl: 6   fesoterodine (TOVIAZ) 8 MG TB24 tablet, Take 1 tablet (8 mg total) by mouth daily., Disp: 90 tablet, Rfl: 3   fexofenadine (ALLEGRA ALLERGY) 180 MG tablet, Take 1 tablet (180 mg total) by mouth daily., Disp: 10 tablet, Rfl: 1   fluticasone (FLONASE) 50 MCG/ACT nasal spray, Place 2 sprays into both nostrils daily., Disp: 16 g, Rfl: 11   lisinopril (ZESTRIL) 40 MG tablet, Take 1 tablet (40 mg total) by mouth daily., Disp: 90 tablet, Rfl: 1   metFORMIN (GLUCOPHAGE-XR) 500 MG 24 hr tablet, Take 2 tablets (1,000 mg total) by mouth 2 (two) times daily with a meal., Disp: 360 tablet, Rfl: 1   nystatin (MYCOSTATIN) 100000 UNIT/ML suspension, Take 5 mLs (500,000 Units total) by mouth 4 (four) times daily., Disp: 60 mL, Rfl: 0   pantoprazole (PROTONIX) 40 MG tablet, Take 1 tablet (40 mg total) by mouth every morning., Disp: 90 tablet, Rfl: 1   Roflumilast (DALIRESP) 250 MCG TABS, Take 1  tablet by mouth daily., Disp: 28 tablet, Rfl: 0   Spacer/Aero-Holding Chambers (AEROCHAMBER MV) inhaler, Use as instructed, Disp: 1 each, Rfl: 0   SPIRIVA RESPIMAT 2.5 MCG/ACT AERS, SMARTSIG:2 Puff(s) Via Inhaler Daily, Disp: 4 g, Rfl: 5   SYMBICORT 160-4.5 MCG/ACT inhaler, Inhale 2 puffs into the lungs 2 (two) times daily.,  Disp: , Rfl:    tiZANidine (ZANAFLEX) 4 MG capsule, Take 4 mg by mouth 3 (three) times daily as needed for muscle spasms. Reported on 07/11/2015, Disp: , Rfl:    triamcinolone cream (KENALOG) 0.1 %, Apply 1 application topically 2 (two) times daily., Disp: 30 g, Rfl: 0   TRULICITY 3.50 VD/7.3AQ SOPN, Inject 0.75 mg into the skin once a week., Disp: 3 mL, Rfl: 1   albuterol (PROVENTIL) (2.5 MG/3ML) 0.083% nebulizer solution, Take 3 mLs (2.5 mg total) by nebulization every 6 (six) hours as needed for wheezing or shortness of breath. (Patient not taking: Reported on 06/12/2021), Disp: 150 mL, Rfl: 1    Assessment & Plan:  URI: / acute sinusitis Will start pt on augmentin for such Will need to use tessalon/ allegra  pt advised to take Tylenol q 4- 6 hourly as needed. pt to take allegra q pm as needed and to call office if symptoms worsened pt verbalised understanding of such.   Problem List Items Addressed This Visit       Respiratory   Acute non-recurrent maxillary sinusitis - Primary   Relevant Medications   fexofenadine (ALLEGRA ALLERGY) 180 MG tablet   benzonatate (TESSALON) 100 MG capsule   amoxicillin-clavulanate (AUGMENTIN) 875-125 MG tablet     No orders of the defined types were placed in this encounter.    Meds ordered this encounter  Medications   fexofenadine (ALLEGRA ALLERGY) 180 MG tablet    Sig: Take 1 tablet (180 mg total) by mouth daily.    Dispense:  10 tablet    Refill:  1   benzonatate (TESSALON) 100 MG capsule    Sig: Take 1 capsule (100 mg total) by mouth 2 (two) times daily as needed for up to 20 days for cough.    Dispense:  20 capsule     Refill:  0   amoxicillin-clavulanate (AUGMENTIN) 875-125 MG tablet    Sig: Take 1 tablet by mouth 2 (two) times daily for 10 days.    Dispense:  20 tablet    Refill:  0     Follow up plan: No follow-ups on file.

## 2021-06-22 DIAGNOSIS — G8929 Other chronic pain: Secondary | ICD-10-CM | POA: Diagnosis not present

## 2021-06-22 DIAGNOSIS — M25511 Pain in right shoulder: Secondary | ICD-10-CM | POA: Diagnosis not present

## 2021-06-22 DIAGNOSIS — M19011 Primary osteoarthritis, right shoulder: Secondary | ICD-10-CM | POA: Diagnosis not present

## 2021-06-27 ENCOUNTER — Other Ambulatory Visit: Payer: Self-pay | Admitting: Family Medicine

## 2021-06-28 ENCOUNTER — Other Ambulatory Visit: Payer: Self-pay

## 2021-06-28 DIAGNOSIS — Z87891 Personal history of nicotine dependence: Secondary | ICD-10-CM

## 2021-06-28 DIAGNOSIS — F17210 Nicotine dependence, cigarettes, uncomplicated: Secondary | ICD-10-CM

## 2021-06-28 DIAGNOSIS — F17200 Nicotine dependence, unspecified, uncomplicated: Secondary | ICD-10-CM

## 2021-06-28 NOTE — Telephone Encounter (Signed)
Requested Prescriptions  Pending Prescriptions Disp Refills   TRULICITY 0.75 MG/0.5ML SOPN [Pharmacy Med Name: TRULICITY 0.75MG /0.5ML SDP 0.5ML] 2 mL 2    Sig: ADMINISTER 0.75 MG UNDER THE SKIN 1 TIME A WEEK     Endocrinology:  Diabetes - GLP-1 Receptor Agonists Passed - 06/27/2021  5:05 PM      Passed - HBA1C is between 0 and 7.9 and within 180 days    Hemoglobin A1C  Date Value Ref Range Status  02/29/2016 6.7  Final   HB A1C (BAYER DCA - WAIVED)  Date Value Ref Range Status  05/11/2021 6.6 (H) 4.8 - 5.6 % Final    Comment:             Prediabetes: 5.7 - 6.4          Diabetes: >6.4          Glycemic control for adults with diabetes: <7.0          Passed - Valid encounter within last 6 months    Recent Outpatient Visits          2 weeks ago Acute non-recurrent maxillary sinusitis   [facility] Vigg, Avanti, MD   1 month ago Primary hypertension   [facility] Abrams, Megan P, DO   2 months ago COPD exacerbation (HCC)   [facility] Carmine, Megan P, DO   4 months ago Routine general medical examination at a health care facility   [facility], Megan P, DO   5 months ago Tooth pain   [facility] Vigg, Avanti, MD      Future Appointments            In 1 month Salena Saner, MD [facility] [place]   In 1 month [place], Oralia Rud, DO [facility]

## 2021-07-04 DIAGNOSIS — M19012 Primary osteoarthritis, left shoulder: Secondary | ICD-10-CM | POA: Diagnosis not present

## 2021-07-04 DIAGNOSIS — M25512 Pain in left shoulder: Secondary | ICD-10-CM | POA: Diagnosis not present

## 2021-07-04 DIAGNOSIS — Z86010 Personal history of colonic polyps: Secondary | ICD-10-CM | POA: Diagnosis not present

## 2021-07-04 DIAGNOSIS — R1310 Dysphagia, unspecified: Secondary | ICD-10-CM | POA: Diagnosis not present

## 2021-07-04 DIAGNOSIS — G8929 Other chronic pain: Secondary | ICD-10-CM | POA: Diagnosis not present

## 2021-07-04 DIAGNOSIS — K219 Gastro-esophageal reflux disease without esophagitis: Secondary | ICD-10-CM | POA: Diagnosis not present

## 2021-07-17 ENCOUNTER — Other Ambulatory Visit: Payer: Self-pay | Admitting: Primary Care

## 2021-07-20 ENCOUNTER — Ambulatory Visit: Payer: BC Managed Care – PPO

## 2021-07-20 ENCOUNTER — Encounter: Payer: Self-pay | Admitting: Pulmonary Disease

## 2021-07-20 MED ORDER — SPIRIVA RESPIMAT 2.5 MCG/ACT IN AERS: 2.0000 | INHALATION_SPRAY | Freq: Every day | RESPIRATORY_TRACT | 2 refills | Status: DC

## 2021-07-20 NOTE — Telephone Encounter (Signed)
Called and spoke to patient about RX for Spiriva. Sent in to patients preferred pharmacy Nothing further needed.

## 2021-07-21 ENCOUNTER — Ambulatory Visit
Admission: RE | Admit: 2021-07-21 | Discharge: 2021-07-21 | Disposition: A | Discharge status: Home / Self Care | Payer: BC Managed Care – PPO | Source: Ambulatory Visit | Attending: Family Medicine | Admitting: Family Medicine

## 2021-07-21 DIAGNOSIS — Z1231 Encounter for screening mammogram for malignant neoplasm of breast: Secondary | ICD-10-CM | POA: Diagnosis not present

## 2021-07-28 ENCOUNTER — Encounter: Payer: Self-pay | Admitting: Pulmonary Disease

## 2021-07-28 NOTE — Telephone Encounter (Signed)
Patient is asking for a Nicotine patch to be sent in to the [facility] pharmacy. Are we able to do this?  If so what dosage of patch would you like to send in?

## 2021-08-01 ENCOUNTER — Telehealth: Payer: Self-pay | Admitting: Family Medicine

## 2021-08-01 MED ORDER — NICOTINE 21 MG/24HR TD PT24
21.0000 mg | MEDICATED_PATCH | TRANSDERMAL | 1 refills | Status: DC
Start: 2021-08-01 — End: 2022-02-06

## 2021-08-01 NOTE — Telephone Encounter (Signed)
Pharmacist alled in , wanting to [facility] if patient is still taking jaudiance ,metformin and  atorvastatin (LIPITOR) 80 MG tablet

## 2021-08-01 NOTE — Telephone Encounter (Signed)
Left message for [facility] pharmacist with [facility] regarding patient medication list. Oneita Kras to give our office a call back if she has any questions regarding the voice message.

## 2021-08-01 NOTE — Telephone Encounter (Signed)
21mg  Nicotine patch has been sent in to patient preferred pharmacy. Nothing further needed.

## 2021-08-01 NOTE — Telephone Encounter (Signed)
Okay for nicotine patches.  If she is smoking 1 pack a day she would start with the 21 mg size.  If she is smoking less than 1 pack a day she can start with a 14 mg size.  She should put the patch on in the morning and then take it off at that time.  This will allow her to sleep better through the night.

## 2021-08-01 NOTE — Telephone Encounter (Signed)
Aniceto Boss says she will fax over medications that the patient has not filled, she says they have not been able to reach the patient with the number on file. Essentially wants to make sure the patient has access to what she needs. Metformin and Atorvastatin are missing recent claims. Please advise

## 2021-08-03 NOTE — Telephone Encounter (Signed)
Tried calling patient. No answer and no VM available. Will try to call again.

## 2021-08-03 NOTE — Telephone Encounter (Signed)
She should be. Can we call patient and make sure she's taking her meds?

## 2021-08-05 DIAGNOSIS — Z03818 Encounter for observation for suspected exposure to other biological agents ruled out: Secondary | ICD-10-CM | POA: Diagnosis not present

## 2021-08-05 DIAGNOSIS — J019 Acute sinusitis, unspecified: Secondary | ICD-10-CM | POA: Diagnosis not present

## 2021-08-05 DIAGNOSIS — J209 Acute bronchitis, unspecified: Secondary | ICD-10-CM | POA: Diagnosis not present

## 2021-08-05 DIAGNOSIS — J441 Chronic obstructive pulmonary disease with (acute) exacerbation: Secondary | ICD-10-CM | POA: Diagnosis not present

## 2021-08-05 DIAGNOSIS — U071 COVID-19: Secondary | ICD-10-CM | POA: Diagnosis not present

## 2021-08-07 ENCOUNTER — Telehealth: Payer: Self-pay | Admitting: Pulmonary Disease

## 2021-08-07 NOTE — Telephone Encounter (Signed)
Spoke to patient.  She stated that she received call from our office. No voicemail was left.  I do not see record of our office attempting to contact patient.  Nothing further needed at this time.

## 2021-08-07 NOTE — Telephone Encounter (Signed)
Called and spoke to patient. She states that she is still taking all of her medications. Last filled at [facility].

## 2021-08-10 ENCOUNTER — Ambulatory Visit: Payer: BC Managed Care – PPO | Admitting: Pulmonary Disease

## 2021-08-11 ENCOUNTER — Ambulatory Visit: Payer: BC Managed Care – PPO | Admitting: Family Medicine

## 2021-08-11 ENCOUNTER — Telehealth (INDEPENDENT_AMBULATORY_CARE_PROVIDER_SITE_OTHER): Payer: BC Managed Care – PPO | Admitting: Family Medicine

## 2021-08-11 DIAGNOSIS — Z538 Procedure and treatment not carried out for other reasons: Secondary | ICD-10-CM

## 2021-08-11 NOTE — Progress Notes (Signed)
Currently on covid quarantine. Will get her back in for A1c when she is feeling better. Up to date on refills.

## 2021-08-14 ENCOUNTER — Ambulatory Visit (INDEPENDENT_AMBULATORY_CARE_PROVIDER_SITE_OTHER): Payer: BC Managed Care – PPO | Admitting: Family Medicine

## 2021-08-14 ENCOUNTER — Encounter: Payer: Self-pay | Admitting: Family Medicine

## 2021-08-14 ENCOUNTER — Other Ambulatory Visit: Payer: Self-pay

## 2021-08-14 VITALS — BP 130/85 | HR 72 | Temp 97.6°F | Wt 173.0 lb

## 2021-08-14 DIAGNOSIS — U071 COVID-19: Secondary | ICD-10-CM

## 2021-08-14 DIAGNOSIS — E119 Type 2 diabetes mellitus without complications: Secondary | ICD-10-CM

## 2021-08-14 DIAGNOSIS — E041 Nontoxic single thyroid nodule: Secondary | ICD-10-CM

## 2021-08-14 DIAGNOSIS — E782 Mixed hyperlipidemia: Secondary | ICD-10-CM | POA: Diagnosis not present

## 2021-08-14 DIAGNOSIS — I10 Essential (primary) hypertension: Secondary | ICD-10-CM

## 2021-08-14 DIAGNOSIS — K219 Gastro-esophageal reflux disease without esophagitis: Secondary | ICD-10-CM

## 2021-08-14 DIAGNOSIS — I700 Atherosclerosis of aorta: Secondary | ICD-10-CM

## 2021-08-14 DIAGNOSIS — Z0000 Encounter for general adult medical examination without abnormal findings: Secondary | ICD-10-CM | POA: Diagnosis not present

## 2021-08-14 DIAGNOSIS — J441 Chronic obstructive pulmonary disease with (acute) exacerbation: Secondary | ICD-10-CM

## 2021-08-14 DIAGNOSIS — E1169 Type 2 diabetes mellitus with other specified complication: Secondary | ICD-10-CM

## 2021-08-14 DIAGNOSIS — J418 Mixed simple and mucopurulent chronic bronchitis: Secondary | ICD-10-CM

## 2021-08-14 LAB — MICROALBUMIN, URINE WAIVED
Creatinine, Urine Waived: 200 mg/dL (ref 10–300)
Microalb, Ur Waived: 30 mg/L — ABNORMAL HIGH (ref 0–19)
Microalb/Creat Ratio: <30 mg/g (ref <30)

## 2021-08-14 LAB — MICROSCOPIC EXAMINATION

## 2021-08-14 LAB — URINALYSIS, ROUTINE W REFLEX MICROSCOPIC
Bilirubin, UA: NEGATIVE
Glucose, UA: NEGATIVE
Ketones, UA: NEGATIVE
Nitrite, UA: NEGATIVE
Protein,UA: NEGATIVE
RBC, UA: NEGATIVE
Specific Gravity, UA: 1.02 (ref 1.005–1.030)
Urobilinogen, Ur: 0.2 mg/dL (ref 0.2–1.0)
pH, UA: 6 (ref 5.0–7.5)

## 2021-08-14 LAB — BAYER DCA HB A1C WAIVED: HB A1C (BAYER DCA - WAIVED): 8.9 % — ABNORMAL HIGH (ref 4.8–5.6)

## 2021-08-14 MED ORDER — TRULICITY 1.5 MG/0.5ML ~~LOC~~ SOAJ: 1.5000 mg | SUBCUTANEOUS | 1 refills | Status: DC

## 2021-08-14 MED ORDER — ALBUTEROL SULFATE (2.5 MG/3ML) 0.083% IN NEBU: 2.5000 mg | INHALATION_SOLUTION | Freq: Once | RESPIRATORY_TRACT | Status: DC

## 2021-08-14 MED ORDER — FLUTICASONE PROPIONATE 50 MCG/ACT NA SUSP: 2.0000 | Freq: Every day | NASAL | 11 refills | Status: DC

## 2021-08-14 NOTE — Assessment & Plan Note (Signed)
Under good control on current regimen. Continue current regimen. Continue to monitor. Call with any concerns. Refills given. Labs drawn today.   

## 2021-08-14 NOTE — Assessment & Plan Note (Signed)
Will keep BP sugars and cholesterol under good control. Continue to monitor.

## 2021-08-14 NOTE — Assessment & Plan Note (Signed)
Rechecking labs today. Await results. Treat as needed.  °

## 2021-08-14 NOTE — Progress Notes (Signed)
BP 130/85    Pulse 72    Temp 97.6 F (36.4 C)    Wt 173 lb (78.5 kg)    SpO2 96%    BMI 31.64 kg/m    Subjective:    Patient ID: Monica Hull, female    DOB: [birth year], 61 y.o.   MRN: [id number]  HPI: Monica Hull is a 61 y.o. female presenting on 08/14/2021 for comprehensive medical examination. Current medical complaints include:  DIABETES Hypoglycemic episodes:no Polydipsia/polyuria: no Visual disturbance: no Chest pain: no Paresthesias: no Glucose Monitoring: yes  Accucheck frequency: Daily  Fasting glucose: 102 Taking Insulin?: no Blood Pressure Monitoring: not checking Retinal Examination: Not up to Date Foot Exam: Up to Date Diabetic Education: Completed Pneumovax: Up to Date Influenza: Up to Date Aspirin: yes  HYPERTENSION / HYPERLIPIDEMIA Satisfied with current treatment? yes Duration of hypertension: chronic BP monitoring frequency: not checking BP medication side effects: no Past BP meds: lisinopril Duration of hyperlipidemia: chronic Cholesterol medication side effects: no Cholesterol supplements: none Past cholesterol medications: atorvastatin Medication compliance: excellent compliance Aspirin: yes Recent stressors: no Recurrent headaches: no Visual changes: no Palpitations: no Dyspnea: yes Chest pain: no Lower extremity edema: no Dizzy/lightheaded: no  COPD COPD status: stable Satisfied with current treatment?: yes Oxygen use: no Dyspnea frequency:  Cough frequency:  Rescue inhaler frequency:   Limitation of activity: yes Productive cough:  Last Spirometry:  Pneumovax: Up to Date Influenza: Up to Date   She currently lives with: husband and kids and grandkids Menopausal Symptoms: no  Depression Screen done today and results listed below:  Depression screen [facility] 2/9 08/14/2021 05/11/2021 02/07/2021 01/05/2021 08/05/2020  Decreased Interest 1 1 0 0 0  Down, Depressed, Hopeless 1 0 0 0 0  PHQ - 2 Score 2 1 0 0 0   Altered sleeping 3 3 0 - -  Tired, decreased energy 3 3 0 - -  Change in appetite 1 2 0 - -  Feeling bad or failure about yourself  0 0 0 - -  Trouble concentrating 0 1 0 - -  Moving slowly or fidgety/restless 0 0 0 - -  Suicidal thoughts 0 0 - - -  PHQ-9 Score 9 10 0 - -  Difficult doing work/chores - - Not difficult at all - -  Some recent data might be hidden    Past Medical History:  Past Medical History:  Diagnosis Date   Asthma    Carpal tunnel syndrome    Chronic kidney disease    H/O KIDNEY STONES   COPD (chronic obstructive pulmonary disease) (HCC)    Diabetes mellitus without complication (HCC)    DIET CONTROLLED   Elevated liver function tests    GERD (gastroesophageal reflux disease)    Headache    MIGRAINES   Hyperlipidemia    Hypertension    Impaired fasting glucose    Sleep apnea    Not using CPAP- referred to sleep center   Tobacco abuse     Surgical History:  Past Surgical History:  Procedure Laterality Date   CARPAL TUNNEL RELEASE     CESAREAN SECTION     X2   HYSTEROSCOPY WITH D & C N/A 07/11/2015   Procedure: DILATATION AND CURETTAGE /HYSTEROSCOPY;  Surgeon: Rubie Maid, MD;  Location: [facility] ORS;  Service: Gynecology;  Laterality: N/A;   PALATE / UVULA BIOPSY / EXCISION      Medications:  Current Outpatient Medications on File Prior to Visit  Medication Sig  albuterol (VENTOLIN HFA) 108 (90 Base) MCG/ACT inhaler Inhale 2 puffs into the lungs every 6 (six) hours as needed for wheezing or shortness of breath.   aspirin EC 81 MG tablet Take 81 mg by mouth daily. Swallow whole.   atorvastatin (LIPITOR) 80 MG tablet Take 1 tablet (80 mg total) by mouth daily.   diclofenac Sodium (VOLTAREN) 1 % GEL Apply 4 g topically 4 (four) times daily.   fesoterodine (TOVIAZ) 8 MG TB24 tablet Take 1 tablet (8 mg total) by mouth daily.   fexofenadine (ALLEGRA ALLERGY) 180 MG tablet Take 1 tablet (180 mg total) by mouth daily.   lisinopril (ZESTRIL) 40 MG tablet  Take 1 tablet (40 mg total) by mouth daily.   metFORMIN (GLUCOPHAGE-XR) 500 MG 24 hr tablet Take 2 tablets (1,000 mg total) by mouth 2 (two) times daily with a meal.   nystatin (MYCOSTATIN) 100000 UNIT/ML suspension Take 5 mLs (500,000 Units total) by mouth 4 (four) times daily.   pantoprazole (PROTONIX) 40 MG tablet Take 1 tablet (40 mg total) by mouth every morning.   Roflumilast 250 MCG TABS TAKE 1 TABLET BY MOUTH DAILY   Spacer/Aero-Holding Chambers (AEROCHAMBER MV) inhaler Use as instructed   SYMBICORT 160-4.5 MCG/ACT inhaler Inhale 2 puffs into the lungs 2 (two) times daily.   Tiotropium Bromide Monohydrate (SPIRIVA RESPIMAT) 2.5 MCG/ACT AERS Inhale 2 puffs into the lungs daily.   tiZANidine (ZANAFLEX) 4 MG capsule Take 4 mg by mouth 3 (three) times daily as needed for muscle spasms. Reported on 07/11/2015   triamcinolone cream (KENALOG) 0.1 % Apply 1 application topically 2 (two) times daily.   albuterol (PROVENTIL) (2.5 MG/3ML) 0.083% nebulizer solution Take 3 mLs (2.5 mg total) by nebulization every 6 (six) hours as needed for wheezing or shortness of breath. (Patient not taking: Reported on 08/14/2021)   nicotine (NICODERM CQ - DOSED IN MG/24 HOURS) 21 mg/24hr patch Place 1 patch (21 mg total) onto the skin daily. (Patient not taking: Reported on 08/14/2021)   No current facility-administered medications on file prior to visit.    Allergies:  Allergies  Allergen Reactions   Advair Hfa [Fluticasone-Salmeterol] Rash    Rash and thrush   Anoro Ellipta [Umeclidinium-Vilanterol] Rash    Causes patients tongue to break out   Biaxin [Clarithromycin] Nausea And Vomiting and Rash   Fluticasone Rash    Social History:  Social History   Socioeconomic History   Marital status: Married    Spouse name: Not on file   Number of children: Not on file   Years of education: Not on file   Highest education level: Not on file  Occupational History   Not on file  Tobacco Use   Smoking status:  Every Day    Packs/day: 1.50    Years: 39.00    Pack years: 58.50    Types: Cigarettes   Smokeless tobacco: Never  Vaping Use   Vaping Use: Never used  Substance and Sexual Activity   Alcohol use: No    Alcohol/week: 0.0 standard drinks   Drug use: No   Sexual activity: Not Currently    Partners: Male  Other Topics Concern   Not on file  Social History Narrative   Not on file   Social Determinants of Health   Financial Resource Strain: Not on file  Food Insecurity: Not on file  Transportation Needs: Not on file  Physical Activity: Not on file  Stress: Not on file  Social Connections: Not on file  Intimate  Partner Violence: Not on file   Social History   Tobacco Use  Smoking Status Every Day   Packs/day: 1.50   Years: 39.00   Pack years: 58.50   Types: Cigarettes  Smokeless Tobacco Never   Social History   Substance and Sexual Activity  Alcohol Use No   Alcohol/week: 0.0 standard drinks    Family History:  Family History  Problem Relation Age of Onset   Arthritis Mother    Asthma Mother    Diabetes Mother    Heart disease Mother    Hyperlipidemia Mother    Hypertension Mother    Kidney disease Mother    Lung disease Mother    Pancreatic cancer Mother    Alcohol abuse Father    Hypertension Father    Hyperlipidemia Father    Diabetes Brother    Breast cancer Maternal Aunt 58   Breast cancer Maternal Aunt 48    Past medical history, surgical history, medications, allergies, family history and social history reviewed with patient today and changes made to appropriate areas of the chart.   Review of Systems  Constitutional: Negative.   HENT: Negative.    Eyes: Negative.   Respiratory:  Positive for shortness of breath. Negative for cough, hemoptysis, sputum production and wheezing.   Cardiovascular:  Positive for palpitations. Negative for chest pain, orthopnea, claudication, leg swelling and PND.  Gastrointestinal:  Positive for blood in stool,  heartburn and nausea. Negative for abdominal pain, constipation, diarrhea, melena and vomiting.  Genitourinary: Negative.   Musculoskeletal:  Positive for myalgias. Negative for back pain, falls, joint pain and neck pain.  Skin: Negative.   Neurological:  Positive for tingling. Negative for dizziness, tremors, sensory change, speech change, focal weakness, seizures, loss of consciousness, weakness and headaches.  Endo/Heme/Allergies:  Positive for polydipsia. Negative for environmental allergies. Bruises/bleeds easily.  Psychiatric/Behavioral: Negative.    All other ROS negative except what is listed above and in the HPI.      Objective:    BP 130/85    Pulse 72    Temp 97.6 F (36.4 C)    Wt 173 lb (78.5 kg)    SpO2 96%    BMI 31.64 kg/m   Wt Readings from Last 3 Encounters:  08/14/21 173 lb (78.5 kg)  05/23/21 171 lb (77.6 kg)  05/11/21 177 lb (80.3 kg)    Physical Exam Vitals and nursing note reviewed.  Constitutional:      General: She is not in acute distress.    Appearance: Normal appearance. She is not ill-appearing, toxic-appearing or diaphoretic.  HENT:     Head: Normocephalic and atraumatic.     Right Ear: Tympanic membrane, ear canal and external ear normal. There is no impacted cerumen.     Left Ear: Tympanic membrane, ear canal and external ear normal. There is no impacted cerumen.     Nose: Nose normal. No congestion or rhinorrhea.     Mouth/Throat:     Mouth: Mucous membranes are moist.     Pharynx: Oropharynx is clear. No oropharyngeal exudate or posterior oropharyngeal erythema.  Eyes:     General: No scleral icterus.       Right eye: No discharge.        Left eye: No discharge.     Extraocular Movements: Extraocular movements intact.     Conjunctiva/sclera: Conjunctivae normal.     Pupils: Pupils are equal, round, and reactive to light.  Neck:     Vascular: No carotid bruit.  Cardiovascular:     Rate and Rhythm: Normal rate and regular rhythm.     Pulses:  Normal pulses.     Heart sounds: No murmur heard.   No friction rub. No gallop.  Pulmonary:     Effort: Pulmonary effort is normal. No respiratory distress.     Breath sounds: No stridor. Wheezing and rhonchi present. No rales.     Comments: Lungs clear after neb Chest:     Chest wall: No tenderness.  Abdominal:     General: Abdomen is flat. Bowel sounds are normal. There is no distension.     Palpations: Abdomen is soft. There is no mass.     Tenderness: There is no abdominal tenderness. There is no right CVA tenderness, left CVA tenderness, guarding or rebound.     Hernia: No hernia is present.  Genitourinary:    Comments: Breast and pelvic exams deferred with shared decision making Musculoskeletal:        General: No swelling, tenderness, deformity or signs of injury.     Cervical back: Normal range of motion and neck supple. No rigidity. No muscular tenderness.     Right lower leg: No edema.     Left lower leg: No edema.  Lymphadenopathy:     Cervical: No cervical adenopathy.  Skin:    General: Skin is warm and dry.     Capillary Refill: Capillary refill takes less than 2 seconds.     Coloration: Skin is not jaundiced or pale.     Findings: No bruising, erythema, lesion or rash.  Neurological:     General: No focal deficit present.     Mental Status: She is alert and oriented to person, place, and time. Mental status is at baseline.     Cranial Nerves: No cranial nerve deficit.     Sensory: No sensory deficit.     Motor: No weakness.     Coordination: Coordination normal.     Gait: Gait normal.     Deep Tendon Reflexes: Reflexes normal.  Psychiatric:        Mood and Affect: Mood normal.        Behavior: Behavior normal.        Thought Content: Thought content normal.        Judgment: Judgment normal.    Results for orders placed or performed in visit on 05/11/21  Comprehensive metabolic panel  Result Value Ref Range   Glucose 151 (H) 70 - 99 mg/dL   BUN 9 8 - 27  mg/dL   Creatinine, Ser 0.69 0.57 - 1.00 mg/dL   eGFR 99 >59 mL/min/1.73   BUN/Creatinine Ratio 13 12 - 28   Sodium 141 134 - 144 mmol/L   Potassium 4.0 3.5 - 5.2 mmol/L   Chloride 102 96 - 106 mmol/L   CO2 23 20 - 29 mmol/L   Calcium 9.4 8.7 - 10.3 mg/dL   Total Protein 6.7 6.0 - 8.5 g/dL   Albumin 4.1 3.8 - 4.8 g/dL   Globulin, Total 2.6 1.5 - 4.5 g/dL   Albumin/Globulin Ratio 1.6 1.2 - 2.2   Bilirubin Total 0.8 0.0 - 1.2 mg/dL   Alkaline Phosphatase 143 (H) 44 - 121 IU/L   AST 26 0 - 40 IU/L   ALT 36 (H) 0 - 32 IU/L  Bayer DCA Hb A1c Waived  Result Value Ref Range   HB A1C (BAYER DCA - WAIVED) 6.6 (H) 4.8 - 5.6 %  Lipid Panel w/o Chol/HDL Ratio  Result  Value Ref Range   Cholesterol, Total 160 100 - 199 mg/dL   Triglycerides 128 0 - 149 mg/dL   HDL 51 >39 mg/dL   VLDL Cholesterol Cal 23 5 - 40 mg/dL   LDL Chol Calc (NIH) 86 0 - 99 mg/dL      Assessment & Plan:   Problem List Items Addressed This Visit       Cardiovascular and Mediastinum   Hypertension    Under good control on current regimen. Continue current regimen. Continue to monitor. Call with any concerns. Refills given. Labs drawn today.       Relevant Orders   Comprehensive metabolic panel   CBC with Differential/Platelet   Aortic atherosclerosis (HCC)    Will keep BP sugars and cholesterol under good control. Continue to monitor.         Respiratory   COPD (chronic obstructive pulmonary disease) (HCC)    In slight exacerbation due to COVID, but has been doing well on current regimen. Continue current regimen. Continue to monitor. Call with any concerns. Refills given. Labs drawn today.       Relevant Medications   albuterol (PROVENTIL) (2.5 MG/3ML) 0.083% nebulizer solution 2.5 mg   fluticasone (FLONASE) 50 MCG/ACT nasal spray     Digestive   GERD (gastroesophageal reflux disease)    Under good control on current regimen. Continue current regimen. Continue to monitor. Call with any concerns.  Refills given. Labs drawn today.         Endocrine   Type 2 diabetes mellitus with other specified complication ([place])    Not doing well with A1c of 8.9- will continue metformin and increase her trulicity to 9.3TD. Recheck 3 months. Call with any concerns.       Relevant Medications   Dulaglutide (TRULICITY) 1.5 SK/8.7GO SOPN   Thyroid nodule    Rechecking labs today. Await results. Treat as needed.       Relevant Orders   Comprehensive metabolic panel   CBC with Differential/Platelet   TSH     Other   Hyperlipidemia    Under good control on current regimen. Continue current regimen. Continue to monitor. Call with any concerns. Refills given. Labs drawn today.       Relevant Orders   Comprehensive metabolic panel   CBC with Differential/Platelet   Lipid Panel w/o Chol/HDL Ratio   Other Visit Diagnoses     Routine general medical examination at a health care facility    -  Primary   Vaccines up to date. Screening labs checked today. Pap N/A. Mammogram up to date. Colonoscopy scheduled. Continue diet and exercise. Call with concerns.    Relevant Orders   Comprehensive metabolic panel   CBC with Differential/Platelet   Bayer DCA Hb A1c Waived   Lipid Panel w/o Chol/HDL Ratio   Microalbumin, Urine Waived   Urinalysis, Routine w reflex microscopic   TSH   COPD exacerbation (HCC)       Better after neb. Will hold on steroid for now. Will continue nebs every 3-4 hours at home. Call with any concerns.    Relevant Medications   albuterol (PROVENTIL) (2.5 MG/3ML) 0.083% nebulizer solution 2.5 mg   fluticasone (FLONASE) 50 MCG/ACT nasal spray   COVID-19       Resolving. Feeling better. Call with any concerns.         Follow up plan: Return in about 3 months (around 11/11/2021).   LABORATORY TESTING:  - Pap smear: not applicable  IMMUNIZATIONS:   -  Tdap: Tetanus vaccination status reviewed: last tetanus booster within 10 years. - Influenza: Up to date - Pneumovax:  Up to date - Prevnar: Not applicable - COVID: Up to date - HPV: Not applicable - Shingrix vaccine: #1 given, due for #2  SCREENING: -Mammogram: Up to date  - Colonoscopy:  Scheduled for next month    PATIENT COUNSELING:   Advised to take 1 mg of folate supplement per day if capable of pregnancy.   Sexuality: Discussed sexually transmitted diseases, partner selection, use of condoms, avoidance of unintended pregnancy  and contraceptive alternatives.   Advised to avoid cigarette smoking.  I discussed with the patient that most people either abstain from alcohol or drink within safe limits (<=14/week and <=4 drinks/occasion for males, <=7/weeks and <= 3 drinks/occasion for females) and that the risk for alcohol disorders and other health effects rises proportionally with the number of drinks per week and how often a drinker exceeds daily limits.  Discussed cessation/primary prevention of drug use and availability of treatment for abuse.   Diet: Encouraged to adjust caloric intake to maintain  or achieve ideal body weight, to reduce intake of dietary saturated fat and total fat, to limit sodium intake by avoiding high sodium foods and not adding table salt, and to maintain adequate dietary potassium and calcium preferably from fresh fruits, vegetables, and low-fat dairy products.    stressed the importance of regular exercise  Injury prevention: Discussed safety belts, safety helmets, smoke detector, smoking near bedding or upholstery.   Dental health: Discussed importance of regular tooth brushing, flossing, and dental visits.    NEXT PREVENTATIVE PHYSICAL DUE IN 1 YEAR. Return in about 3 months (around 11/11/2021).

## 2021-08-14 NOTE — Assessment & Plan Note (Signed)
In slight exacerbation due to COVID, but has been doing well on current regimen. Continue current regimen. Continue to monitor. Call with any concerns. Refills given. Labs drawn today.

## 2021-08-14 NOTE — Assessment & Plan Note (Signed)
Not doing well with A1c of 8.9- will continue metformin and increase her trulicity to 1.5mg . Recheck 3 months. Call with any concerns.

## 2021-08-15 LAB — COMPREHENSIVE METABOLIC PANEL
ALT: 74 IU/L — ABNORMAL HIGH (ref 0–32)
AST: 35 IU/L (ref 0–40)
Albumin/Globulin Ratio: 1.7 (ref 1.2–2.2)
Albumin: 3.9 g/dL (ref 3.8–4.8)
Alkaline Phosphatase: 147 IU/L — ABNORMAL HIGH (ref 44–121)
BUN/Creatinine Ratio: 14 (ref 12–28)
BUN: 10 mg/dL (ref 8–27)
Bilirubin Total: 0.7 mg/dL (ref 0.0–1.2)
CO2: 26 mmol/L (ref 20–29)
Calcium: 9.6 mg/dL (ref 8.7–10.3)
Chloride: 101 mmol/L (ref 96–106)
Creatinine, Ser: 0.71 mg/dL (ref 0.57–1.00)
Globulin, Total: 2.3 g/dL (ref 1.5–4.5)
Glucose: 179 mg/dL — ABNORMAL HIGH (ref 70–99)
Potassium: 4.1 mmol/L (ref 3.5–5.2)
Sodium: 140 mmol/L (ref 134–144)
Total Protein: 6.2 g/dL (ref 6.0–8.5)
eGFR: 97 mL/min/{1.73_m2} (ref >59)

## 2021-08-15 LAB — LIPID PANEL W/O CHOL/HDL RATIO
Cholesterol, Total: 154 mg/dL (ref 100–199)
HDL: 42 mg/dL (ref >39)
LDL Chol Calc (NIH): 84 mg/dL (ref 0–99)
Triglycerides: 164 mg/dL — ABNORMAL HIGH (ref 0–149)
VLDL Cholesterol Cal: 28 mg/dL (ref 5–40)

## 2021-08-15 LAB — CBC WITH DIFFERENTIAL/PLATELET
Basophils Absolute: 0.1 10*3/uL (ref 0.0–0.2)
Basos: 1 %
EOS (ABSOLUTE): 0.3 10*3/uL (ref 0.0–0.4)
Eos: 4 %
Hematocrit: 49.7 % — ABNORMAL HIGH (ref 34.0–46.6)
Hemoglobin: 16.7 g/dL — ABNORMAL HIGH (ref 11.1–15.9)
Immature Grans (Abs): 0.1 10*3/uL (ref 0.0–0.1)
Immature Granulocytes: 1 %
Lymphocytes Absolute: 3.2 10*3/uL — ABNORMAL HIGH (ref 0.7–3.1)
Lymphs: 37 %
MCH: 30.5 pg (ref 26.6–33.0)
MCHC: 33.6 g/dL (ref 31.5–35.7)
MCV: 91 fL (ref 79–97)
Monocytes Absolute: 0.7 10*3/uL (ref 0.1–0.9)
Monocytes: 8 %
Neutrophils Absolute: 4.5 10*3/uL (ref 1.4–7.0)
Neutrophils: 49 %
Platelets: 254 10*3/uL (ref 150–450)
RBC: 5.47 x10E6/uL — ABNORMAL HIGH (ref 3.77–5.28)
RDW: 13 % (ref 11.7–15.4)
WBC: 8.9 10*3/uL (ref 3.4–10.8)

## 2021-08-16 LAB — TSH: TSH: 0.983 u[IU]/mL (ref 0.450–4.500)

## 2021-08-16 LAB — SPECIMEN STATUS REPORT

## 2021-08-26 ENCOUNTER — Other Ambulatory Visit: Payer: Self-pay | Admitting: Primary Care

## 2021-08-28 NOTE — Telephone Encounter (Signed)
Beth, please advise if you want pt to stay on this dose of if it is time for her to go up to ?

## 2021-08-28 NOTE — Telephone Encounter (Signed)
If tolerating medication we need to increased to daily.

## 2021-09-01 ENCOUNTER — Ambulatory Visit: Payer: BC Managed Care – PPO | Admitting: Cardiovascular Disease

## 2021-09-01 ENCOUNTER — Encounter: Payer: Self-pay | Admitting: Cardiovascular Disease

## 2021-09-01 ENCOUNTER — Other Ambulatory Visit: Payer: Self-pay

## 2021-09-01 VITALS — BP 110/70 | HR 83 | Ht 62.0 in | Wt 172.4 lb

## 2021-09-01 DIAGNOSIS — E782 Mixed hyperlipidemia: Secondary | ICD-10-CM

## 2021-09-01 DIAGNOSIS — I2584 Coronary atherosclerosis due to calcified coronary lesion: Secondary | ICD-10-CM

## 2021-09-01 DIAGNOSIS — R0609 Other forms of dyspnea: Secondary | ICD-10-CM

## 2021-09-01 DIAGNOSIS — I2510 Atherosclerotic heart disease of native coronary artery without angina pectoris: Secondary | ICD-10-CM

## 2021-09-01 DIAGNOSIS — I700 Atherosclerosis of aorta: Secondary | ICD-10-CM

## 2021-09-01 DIAGNOSIS — I10 Essential (primary) hypertension: Secondary | ICD-10-CM

## 2021-09-01 DIAGNOSIS — E1165 Type 2 diabetes mellitus with hyperglycemia: Secondary | ICD-10-CM

## 2021-09-01 MED ORDER — VARENICLINE TARTRATE 1 MG PO TABS: 1.0000 mg | ORAL_TABLET | Freq: Two times a day (BID) | ORAL | 3 refills | Status: DC

## 2021-09-01 NOTE — Progress Notes (Signed)
Cardiology Office Note  Date:  09/01/2021   ID:  Monica Hull, DOB 1961/03/18, MRN [id number]  PCP:  Monica Hull, Monica P, DO   Chief Complaint  Patient presents with   12 month follow up     Patient c/o shortness of breath with exertion. Patient has a colon[facility] on [facility]. Medications reviewed by the patient verbally.    HPI:  [name] is a 61 year-old woman with history of smoking,  GERD,  COPD, smokes 1- 1 1/2 ppd hypertension,  Smoking 1 ppd heavy carotid calcification on the right chest pain and syncope dating back to 2000  Stress test 2011, no ischemia who presents for symptoms of chest pain and syncope.  Colonscopy 3/20 No recent hospital admissions  Arthritis shoulders b/l, klnees b/l wears flip flops Right shoulder firsy, burn   Cut back on cigs, 1 ppd  08/05/2021: covid, sinus congestion Has SOB since then  Total chol 154 A1C 8.9  CT chest 1. Coronary calcium score of 249. This was 95th percentile for age and sex matched control. 2. Normal coronary origin with right dominance. 3. Calcified and non calcified plaque in the mid LAD causing moderate stenosis (50%). 4. Minimal calcifications (<25%) in the mid RCA and LCx arteries   EKG personally reviewed by myself on todays visit NSR rate 83 bpm no ST or T wave changes    CT scan of the head and neck  Report details thyroid nodule, sinus disease, mastoid disease  heavy carotid calcification on the right No significant atherosclerosis in the aortic arch  On discussion concerning her smoking  tried Wellbutrin, uncertain if she tried Chantix, tried nicotine patches She may try vapors   heavy snorer No previous sleep study  Other past medical history rare episodes of syncope dating back to 2000 Symptoms typically present with nausea of uncertain etiology, followed by lightheadedness and syncope. Suspected to be vasovagal   typically passes out for 5-10 minutes at a  time. Lips go blue, she is limp. Eventually she comes to.   PMH:   has a past medical history of Asthma, Carpal tunnel syndrome, Chronic kidney disease, COPD (chronic obstructive pulmonary disease) (HCC), Diabetes mellitus without complication (HCC), Elevated liver function tests, GERD (gastroesophageal reflux disease), Headache, Hyperlipidemia, Hypertension, Impaired fasting glucose, Sleep apnea, and Tobacco abuse.  PSH:    Past Surgical History:  Procedure Laterality Date   CARPAL TUNNEL RELEASE     CESAREAN SECTION     X2   HYSTEROSCOPY WITH D & C N/A 07/11/2015   Procedure: DILATATION AND CURETTAGE /HYSTEROSCOPY;  Surgeon: Hildred LaserAnika Cherry, MD;  Location: [facility] ORS;  Service: Gynecology;  Laterality: N/A;   PALATE / UVULA BIOPSY / EXCISION      Current Outpatient Medications  Medication Sig Dispense Refill   albuterol (PROVENTIL) (2.5 MG/3ML) 0.083% nebulizer solution Take 3 mLs (2.5 mg total) by nebulization every 6 (six) hours as needed for wheezing or shortness of breath. 150 mL 1   albuterol (VENTOLIN HFA) 108 (90 Base) MCG/ACT inhaler Inhale 2 puffs into the lungs every 6 (six) hours as needed for wheezing or shortness of breath. 18 g 6   aspirin EC 81 MG tablet Take 81 mg by mouth daily. Swallow whole.     atorvastatin (LIPITOR) 80 MG tablet Take 1 tablet (80 mg total) by mouth daily. 90 tablet 3   diclofenac Sodium (VOLTAREN) 1 % GEL Apply 4 g topically 4 (four) times daily. 350 g 6  Dulaglutide (TRULICITY) 1.5 MG/0.5ML SOPN Inject 1.5 mg into the skin once a week. 6 mL 1   fesoterodine (TOVIAZ) 8 MG TB24 tablet Take 1 tablet (8 mg total) by mouth daily. 90 tablet 3   fexofenadine (ALLEGRA ALLERGY) 180 MG tablet Take 1 tablet (180 mg total) by mouth daily. 10 tablet 1   fluticasone (FLONASE) 50 MCG/ACT nasal spray Place 2 sprays into both nostrils daily. 16 g 11   lisinopril (ZESTRIL) 40 MG tablet Take 1 tablet (40 mg total) by mouth daily. 90 tablet 1   metFORMIN (GLUCOPHAGE-XR)  500 MG 24 hr tablet Take 2 tablets (1,000 mg total) by mouth 2 (two) times daily with a meal. 360 tablet 1   nystatin (MYCOSTATIN) 100000 UNIT/ML suspension Take 5 mLs (500,000 Units total) by mouth 4 (four) times daily. 60 mL 0   pantoprazole (PROTONIX) 40 MG tablet Take 1 tablet (40 mg total) by mouth every morning. 90 tablet 1   roflumilast (DALIRESP) 500 MCG TABS tablet Take 1 tablet (500 mcg total) by mouth daily. 30 tablet 11   Spacer/Aero-Holding Chambers (AEROCHAMBER MV) inhaler Use as instructed 1 each 0   SYMBICORT 160-4.5 MCG/ACT inhaler Inhale 2 puffs into the lungs 2 (two) times daily.     Tiotropium Bromide Monohydrate (SPIRIVA RESPIMAT) 2.5 MCG/ACT AERS Inhale 2 puffs into the lungs daily. 12 g 2   tiZANidine (ZANAFLEX) 4 MG capsule Take 4 mg by mouth 3 (three) times daily as needed for muscle spasms. Reported on 07/11/2015     triamcinolone cream (KENALOG) 0.1 % Apply 1 application topically 2 (two) times daily. 30 g 0   varenicline (CHANTIX CONTINUING MONTH PAK) 1 MG tablet Take 1 tablet (1 mg total) by mouth 2 (two) times daily. 60 tablet 3   nicotine (NICODERM CQ - DOSED IN MG/24 HOURS) 21 mg/24hr patch Place 1 patch (21 mg total) onto the skin daily. (Patient not taking: Reported on 09/01/2021) 30 patch 1   Current Facility-Administered Medications  Medication Dose Route Frequency Provider Last Rate Last Admin   albuterol (PROVENTIL) (2.5 MG/3ML) 0.083% nebulizer solution 2.5 mg  2.5 mg Nebulization Once Johnson, Monica P, DO         Allergies:   Advair hfa [fluticasone-salmeterol], Anoro ellipta [umeclidinium-vilanterol], Biaxin [clarithromycin], and Fluticasone   Social History:  The patient  reports that she has been smoking cigarettes. She has a 58.50 pack-year smoking history. She has never used smokeless tobacco. She reports that she does not drink alcohol and does not use drugs.   Family History:   family history includes Alcohol abuse in her father; Arthritis in her  mother; Asthma in her mother; Breast cancer (age of onset: 8) in her maternal aunt; Breast cancer (age of onset: 19) in her maternal aunt; Diabetes in her brother and mother; Heart disease in her mother; Hyperlipidemia in her father and mother; Hypertension in her father and mother; Kidney disease in her mother; Lung disease in her mother; Pancreatic cancer in her mother.    Review of Systems: Review of Systems  Constitutional: Negative.   Respiratory:  Positive for shortness of breath.   Cardiovascular: Negative.   Gastrointestinal: Negative.   Musculoskeletal: Negative.   Neurological: Negative.   Psychiatric/Behavioral: Negative.    All other systems reviewed and are negative.  PHYSICAL EXAM: VS:  BP 110/70 (BP Location: Left Arm, Patient Position: Sitting, Cuff Size: Normal)    Pulse 83    Ht 5\' 2"  (1.575 m)    Wt 172  lb 6 oz (78.2 kg)    SpO2 98%    BMI 31.53 kg/m  , BMI Body mass index is 31.53 kg/m. Constitutional:  oriented to person, place, and time. No distress.  HENT:  Head: Grossly normal Eyes:  no discharge. No scleral icterus.  Neck: No JVD, no carotid bruits  Cardiovascular: Regular rate and rhythm, no murmurs appreciated Pulmonary/Chest: diffuse rales Abdominal: Soft.  no distension.  no tenderness.  Musculoskeletal: Normal range of motion Neurological:  normal muscle tone. Coordination normal. No atrophy Skin: Skin warm and dry Psychiatric: normal affect, pleasant   Recent Labs: 08/14/2021: ALT 74; BUN 10; Creatinine, Ser 0.71; Hemoglobin 16.7; Platelets 254; Potassium 4.1; Sodium 140; TSH 0.983    Lipid Panel Lab Results  Component Value Date   CHOL 154 08/14/2021   HDL 42 08/14/2021   LDLCALC 84 08/14/2021   TRIG 164 (H) 08/14/2021      Wt Readings from Last 3 Encounters:  09/01/21 172 lb 6 oz (78.2 kg)  08/14/21 173 lb (78.5 kg)  05/23/21 171 lb (77.6 kg)       ASSESSMENT AND PLAN:  Essential hypertension  Blood pressure is well controlled  on today's visit. No changes made to the medications.  Carotid stenosis, right CT scan images from 2017  Discussed need for smoking cessation Stressed importance of taking Lipitor daily  emphysema (HCC) Smoking cessation recommended Followed by pulmonary,  Long discussion with her, she would like to try vapors Discussed other modalities as well  Coronary disease with stable angina Cardiac CTA performed December 2021, no significant stenosis Results reviewed, calcium score 249, moderate stenosis mid LAD Denies anginal symptoms, no further cardiac testing needed at this time  Diabetes type 2 with complications We have encouraged continued exercise, careful diet management in an effort to lose weight.  Smoker We will send in Chantix, cessation discussed  Obesity (BMI 30.0-34.9) Recommend weight loss, low-carb  Hyperlipidemia Taking Lipitor daily, numbers improved   Total encounter time more than 30 minutes  Greater than 50% was spent in counseling and coordination of care with the patient    Orders Placed This Encounter  Procedures   EKG 12-Lead     Signed, Dossie Arbour, M.D., Ph.D. 09/01/2021  [facility] [place], [place] [phone]  he

## 2021-09-01 NOTE — Patient Instructions (Addendum)
Medication Instructions:  ?Chantix 1 mg twice a day ? ?If you need a refill on your cardiac medications before your next appointment, please call your pharmacy.  ? ?Lab work: ?No new labs needed ? ?Testing/Procedures: ?No new testing needed ? ?Follow-Up: ?At [facility], you and your health needs are our priority.  As part of our continuing mission to provide you with exceptional heart care, we have created designated Provider Care Teams.  These Care Teams include your primary Cardiologist (physician) and Advanced Practice Providers (APPs -  Physician Assistants and Nurse Practitioners) who all work together to provide you with the care you need, when you need it. ? ?You will need a follow up appointment in 12 months ? ?Providers on your designated Care Team:   ?Nicolasa Ducking, NP ?Eula Listen, PA-C ?Cadence Fransico Michael, PA-C ? ?COVID-19 Vaccine Information can be found at: [email or web address] For questions related to vaccine distribution or appointments, please email [email or web address] or call [phone].  ? ?

## 2021-09-08 ENCOUNTER — Encounter: Payer: Self-pay | Admitting: *Deleted

## 2021-09-11 ENCOUNTER — Encounter: Admission: RE | Payer: Self-pay | Source: Home / Self Care

## 2021-09-11 ENCOUNTER — Encounter: Payer: Self-pay | Admitting: Certified Registered Nurse Anesthetist

## 2021-09-11 ENCOUNTER — Ambulatory Visit: Admission: RE | Admit: 2021-09-11 | Payer: BC Managed Care – PPO | Source: Home / Self Care

## 2021-09-11 SURGERY — COLONOSCOPY WITH PROPOFOL
Anesthesia: General

## 2021-09-13 DIAGNOSIS — K5909 Other constipation: Secondary | ICD-10-CM | POA: Diagnosis not present

## 2021-09-13 DIAGNOSIS — K219 Gastro-esophageal reflux disease without esophagitis: Secondary | ICD-10-CM | POA: Diagnosis not present

## 2021-09-13 DIAGNOSIS — Z86010 Personal history of colonic polyps: Secondary | ICD-10-CM | POA: Diagnosis not present

## 2021-09-13 DIAGNOSIS — R1310 Dysphagia, unspecified: Secondary | ICD-10-CM | POA: Diagnosis not present

## 2021-09-14 ENCOUNTER — Other Ambulatory Visit: Payer: Self-pay | Admitting: Family Medicine

## 2021-09-15 ENCOUNTER — Other Ambulatory Visit: Payer: Self-pay | Admitting: Family Medicine

## 2021-09-15 ENCOUNTER — Ambulatory Visit: Payer: Self-pay

## 2021-09-15 ENCOUNTER — Telehealth: Payer: Self-pay | Admitting: Family Medicine

## 2021-09-15 DIAGNOSIS — M19011 Primary osteoarthritis, right shoulder: Secondary | ICD-10-CM | POA: Diagnosis not present

## 2021-09-15 DIAGNOSIS — M7581 Other shoulder lesions, right shoulder: Secondary | ICD-10-CM | POA: Diagnosis not present

## 2021-09-15 MED ORDER — TRULICITY 1.5 MG/0.5ML ~~LOC~~ SOAJ: 1.5000 mg | SUBCUTANEOUS | 1 refills | Status: DC

## 2021-09-15 MED ORDER — METFORMIN HCL ER 500 MG PO TB24: 1000.0000 mg | ORAL_TABLET | Freq: Two times a day (BID) | ORAL | 1 refills | Status: DC

## 2021-09-15 NOTE — Telephone Encounter (Signed)
Chief Complaint: Trulicity questions ?Symptoms: N/A ?Frequency: N/A ?Pertinent Negatives: Patient denies N/A ?Disposition: [] ED /[] Urgent Care (no appt availability in office) / [] Appointment(In office/virtual)/ []  [facility]/ [x] Home Care/ [] Refused Recommended Disposition /[] [facility]/ []  Follow-up with PCP ?Additional Notes: , RPH at [facility] call to ask about the correct dosage of Trulicity, because it shows the patient refilled 0.75 mg on 09/02/21 at [facility]. She says when she spoke to [place], in April, the A1C was elevated and the trulicity was increased to 1.5 mg. I advised per OV note 08/14/21, the Trulicity was increased to 1.5 mg and noted to continue on Metformin. She verbalized understanding and also advised Trulicity Rx was sent [facility] for a 90 day supply 1 refill. She says with the program the patient is on with [facility] the local pharmacies can only refill 1 month at a time and it is costly to the patient, whereas using [facility] the patient can receive 90 day supply of medications with no cost. She also says the patient has not refilled Atorvastatin this year, last Rx from 06/2020 and will need Metformin. She asked if there is someone who could call the patient to let her know about the dosage of trulicity and about the other medication refills mentioned, to advised she can receive 90 day at no cost through [facility]. Advised I will reach out to the patient and submit the refill request to her desired pharmacy of Metformin and Atorvastatin. Tasha verbalized understanding. She says she will fax over to the provider a form explaining all of this.  ? ?Patient called and advised of the above from [facility]. She says she did pick up from [facility] Trulicity 0.75 mg, but didn't look at it and knows she's supposed to be on 1.5 mg. I advised her to call [facility] to speak to a pharmacist and let them know what she picked up and if she will need to  take 2 shots of the 0.75 mg to equal the 1.5 mg she's supposed to be on. Advised it may be a reason they gave her the 0.75mg , because the 1.5 mg was sent and received by them, she verbalized understanding. She also says she will start receiving her medicines through [facility] since no cost. She says she had started back on Atorvastatin, so will need a refill sent in and also a refill on Metformin. Advised Trulicity, Metformin and Atorvastin will be sent to [facility]. ? ? ?Reason for Disposition ? Pharmacy calling with prescription question and triager answers question ? ?Answer Assessment - Initial Assessment Questions ?1. NAME of MEDICATION: "What medicine are you calling about?" ?    Trulicity ?2. QUESTION: "What is your question?" (e.g., double dose of medicine, side effect) ?    Dosage patient is on ?3. PRESCRIBING HCP: "Who prescribed it?" Reason: if prescribed by specialist, call should be referred to that group. ?    Dr. [place] ?4. SYMPTOMS: "Do you have any symptoms?" ?    N/A ?5. SEVERITY: If symptoms are present, ask "Are they mild, moderate or severe?" ?    N/A ?6. PREGNANCY:  "Is there any chance that you are pregnant?" "When was your last menstrual period?" ?    N/A ? ?Protocols used: Medication Question Call-A-AH ? ?

## 2021-09-15 NOTE — Telephone Encounter (Signed)
Summary: diabetic management  ? Clinical pharmacist would like to speak with PCP nurse regarding diabetic concerns from office visit 08/14/2021.   ?  ? ? ?Returned Monica Hull's call - LMOM ?

## 2021-09-15 NOTE — Telephone Encounter (Signed)
Requested medication (s) are due for refill today: Yes ? ?Requested medication (s) are on the active medication list: Yes ? ?Last refill:  07/19/20 ? ?Future visit scheduled: Yes ? ?Notes to clinic:  Unable to refill per protocol, last refill by another provider. ? ? ? ? ? ?Requested Prescriptions  ?Pending Prescriptions Disp Refills  ? atorvastatin (LIPITOR) 80 MG tablet 90 tablet 3  ?  Sig: Take 1 tablet (80 mg total) by mouth daily.  ?  ? Cardiovascular:  Antilipid - Statins Failed - 09/15/2021 12:57 PM  ?  ?  Failed - Lipid Panel in normal range within the last 12 months  ?  Cholesterol, Total  ?Date Value Ref Range Status  ?08/14/2021 154 100 - 199 mg/dL Final  ? ?Cholesterol Piccolo, [place]  ?Date Value Ref Range Status  ?05/23/2018 195 <200 mg/dL Final  ?  Comment:  ?                          Desirable                <200 ?                        Borderline High      200- 239 ?                        High                     >239 ?  ? ?LDL Chol Calc (NIH)  ?Date Value Ref Range Status  ?08/14/2021 84 0 - 99 mg/dL Final  ? ?HDL  ?Date Value Ref Range Status  ?08/14/2021 42 >39 mg/dL Final  ? ?Triglycerides  ?Date Value Ref Range Status  ?08/14/2021 164 (H) 0 - 149 mg/dL Final  ? ?Triglycerides Piccolo,Waived  ?Date Value Ref Range Status  ?05/23/2018 183 (H) <150 mg/dL Final  ?  Comment:  ?                          Normal                   <150 ?                        Borderline High     150 - 199 ?                        High                200 - 499 ?                        Very High                >499 ?  ? ?  ?  ?  Passed - Patient is not pregnant  ?  ?  Passed - Valid encounter within last 12 months  ?  Recent Outpatient Visits   ? ?      ? 1 month ago Routine general medical examination at a health care facility  ? [place], Megan P, DO  ? 1 month ago Appointment canceled by hospital  ? [facility], Megan P, DO  ? 3  months ago Acute non-recurrent maxillary  sinusitis  ? [facility] Vigg, Avanti, MD  ? 4 months ago Primary hypertension  ? [place], Megan P, DO  ? 4 months ago COPD exacerbation ([place])  ? [place], [place] P, DO  ? ?  ?  ?Future Appointments   ? ?        ? In 6 days Tyler Pita, MD [facility] [place]  ? In 1 month Johnson, Barb Merino, DO [facility]  ? ?  ? ?  ?  ?  ?Signed Prescriptions Disp Refills  ? metFORMIN (GLUCOPHAGE-XR) 500 MG 24 hr tablet 360 tablet 1  ?  Sig: Take 2 tablets (1,000 mg total) by mouth 2 (two) times daily with a meal.  ?  ? Endocrinology:  Diabetes - Biguanides Failed - 09/15/2021 12:57 PM  ?  ?  Failed - HBA1C is between 0 and 7.9 and within 180 days  ?  Hemoglobin A1C  ?Date Value Ref Range Status  ?02/29/2016 6.7  Final  ? ?HB A1C (BAYER DCA - WAIVED)  ?Date Value Ref Range Status  ?08/14/2021 8.9 (H) 4.8 - 5.6 % Final  ?  Comment:  ?           Prediabetes: 5.7 - 6.4 ?         Diabetes: >6.4 ?         Glycemic control for adults with diabetes: <7.0 ?  ?  ?  ?  ?  Failed - B12 Level in normal range and within 720 days  ?  No results found for: VITAMINB12  ?  ?  ?  Passed - Cr in normal range and within 360 days  ?  Creatinine, Ser  ?Date Value Ref Range Status  ?08/14/2021 0.71 0.57 - 1.00 mg/dL Final  ?  ?  ?  ?  Passed - eGFR in normal range and within 360 days  ?  GFR calc Af Amer  ?Date Value Ref Range Status  ?08/05/2020 109 >59 mL/min/1.73 Final  ?  Comment:  ?  **In accordance with recommendations from the NKF-ASN Task force,** ?  [facility] is in the process of updating its eGFR calculation to the ?  2021 CKD-EPI creatinine equation that estimates kidney function ?  without a race variable. ?  ? ?GFR, Estimated  ?Date Value Ref Range Status  ?07/18/2020 >60 >60 mL/min Final  ?  Comment:  ?  (NOTE) ?Calculated using the CKD-EPI Creatinine Equation (2021) ?  ? ?GFR calc non Af Amer  ?Date Value Ref Range Status  ?08/05/2020 94 >59  mL/min/1.73 Final  ? ?eGFR  ?Date Value Ref Range Status  ?08/14/2021 97 >59 mL/min/1.73 Final  ?  ?  ?  ?  Passed - Valid encounter within last 6 months  ?  Recent Outpatient Visits   ? ?      ? 1 month ago Routine general medical examination at a health care facility  ? [place], Megan P, DO  ? 1 month ago Appointment canceled by hospital  ? [facility], [place] P, DO  ? 3 months ago Acute non-recurrent maxillary sinusitis  ? [facility] Vigg, Avanti, MD  ? 4 months ago Primary hypertension  ? [place], Megan P, DO  ? 4 months ago COPD exacerbation ([place])  ? [place], [place] P, DO  ? ?  ?  ?Future  Appointments   ? ?        ? In 6 days Tyler Pita, MD [facility] [place]  ? In 1 month Johnson, Megan P, DO [facility]  ? ?  ? ?  ?  ?  Passed - CBC within normal limits and completed in the last 12 months  ?  WBC  ?Date Value Ref Range Status  ?08/14/2021 8.9 3.4 - 10.8 x10E3/uL Final  ?07/06/2015 9.1 3.6 - 11.0 K/uL Final  ? ?RBC  ?Date Value Ref Range Status  ?08/14/2021 5.47 (H) 3.77 - 5.28 x10E6/uL Final  ?07/06/2015 5.27 (H) 3.80 - 5.20 MIL/uL Final  ? ?Hemoglobin  ?Date Value Ref Range Status  ?08/14/2021 16.7 (H) 11.1 - 15.9 g/dL Final  ? ?Hematocrit  ?Date Value Ref Range Status  ?08/14/2021 49.7 (H) 34.0 - 46.6 % Final  ? ?MCHC  ?Date Value Ref Range Status  ?08/14/2021 33.6 31.5 - 35.7 g/dL Final  ?07/06/2015 33.8 32.0 - 36.0 g/dL Final  ? ?MCH  ?Date Value Ref Range Status  ?08/14/2021 30.5 26.6 - 33.0 pg Final  ?07/06/2015 30.1 26.0 - 34.0 pg Final  ? ?MCV  ?Date Value Ref Range Status  ?08/14/2021 91 79 - 97 fL Final  ? ?No results found for: PLTCOUNTKUC, LABPLAT, [place] ?RDW  ?Date Value Ref Range Status  ?08/14/2021 13.0 11.7 - 15.4 % Final  ? ?  ?  ?  ? Dulaglutide (TRULICITY) 1.5 UE/2.8MK SOPN 6 mL 1  ?  Sig: Inject 1.5 mg into the skin once a week.  ?  ?  Endocrinology:  Diabetes - GLP-1 Receptor Agonists Failed - 09/15/2021 12:57 PM  ?  ?  Failed - HBA1C is between 0 and 7.9 and within 180 days  ?  Hemoglobin A1C  ?Date Value Ref Range Status  ?02/29/2016 6.7  Final  ? ?HB A1C (BAYER DCA - WAIVED)  ?Date Value Ref Range Status  ?08/14/2021 8.9 (H) 4.8 - 5.6 % Final  ?  Comment:  ?           Prediabetes: 5.7 - 6.4 ?         Diabetes: >6.4 ?         Glycemic control for adults with diabetes: <7.0 ?  ?  ?  ?  ?  Passed - Valid encounter within last 6 months  ?  Recent Outpatient Visits   ? ?      ? 1 month ago Routine general medical examination at a health care facility  ? [place], Megan P, DO  ? 1 month ago Appointment canceled by hospital  ? [facility], [place] P, DO  ? 3 months ago Acute non-recurrent maxillary sinusitis  ? [facility] Vigg, Avanti, MD  ? 4 months ago Primary hypertension  ? [place], Megan P, DO  ? 4 months ago COPD exacerbation ([place])  ? [place], [place] P, DO  ? ?  ?  ?Future Appointments   ? ?        ? In 6 days Tyler Pita, MD [facility] [place]  ? In 1 month Johnson, Barb Merino, DO [facility]  ? ?  ? ?  ?  ?  ? ? ? ? ?

## 2021-09-15 NOTE — Telephone Encounter (Signed)
Requested Prescriptions  ?Pending Prescriptions Disp Refills  ?? metFORMIN (GLUCOPHAGE-XR) 500 MG 24 hr tablet 360 tablet 1  ?  Sig: Take 2 tablets (1,000 mg total) by mouth 2 (two) times daily with a meal.  ?  ? Endocrinology:  Diabetes - Biguanides Failed - 09/15/2021 12:57 PM  ?  ?  Failed - HBA1C is between 0 and 7.9 and within 180 days  ?  Hemoglobin A1C  ?Date Value Ref Range Status  ?02/29/2016 6.7  Final  ? ?HB A1C (BAYER DCA - WAIVED)  ?Date Value Ref Range Status  ?08/14/2021 8.9 (H) 4.8 - 5.6 % Final  ?  Comment:  ?           Prediabetes: 5.7 - 6.4 ?         Diabetes: >6.4 ?         Glycemic control for adults with diabetes: <7.0 ?  ?   ?  ?  Failed - B12 Level in normal range and within 720 days  ?  No results found for: VITAMINB12   ?  ?  Passed - Cr in normal range and within 360 days  ?  Creatinine, Ser  ?Date Value Ref Range Status  ?08/14/2021 0.71 0.57 - 1.00 mg/dL Final  ?   ?  ?  Passed - eGFR in normal range and within 360 days  ?  GFR calc Af Amer  ?Date Value Ref Range Status  ?08/05/2020 109 >59 mL/min/1.73 Final  ?  Comment:  ?  **In accordance with recommendations from the NKF-ASN Task force,** ?  [facility] is in the process of updating its eGFR calculation to the ?  2021 CKD-EPI creatinine equation that estimates kidney function ?  without a race variable. ?  ? ?GFR, Estimated  ?Date Value Ref Range Status  ?07/18/2020 >60 >60 mL/min Final  ?  Comment:  ?  (NOTE) ?Calculated using the CKD-EPI Creatinine Equation (2021) ?  ? ?GFR calc non Af Amer  ?Date Value Ref Range Status  ?08/05/2020 94 >59 mL/min/1.73 Final  ? ?eGFR  ?Date Value Ref Range Status  ?08/14/2021 97 >59 mL/min/1.73 Final  ?   ?  ?  Passed - Valid encounter within last 6 months  ?  Recent Outpatient Visits   ?      ? 1 month ago Routine general medical examination at a health care facility  ? [place], Megan P, DO  ? 1 month ago Appointment canceled by hospital  ? [facility], [place]  P, DO  ? 3 months ago Acute non-recurrent maxillary sinusitis  ? [facility] Vigg, Avanti, MD  ? 4 months ago Primary hypertension  ? [place], Megan P, DO  ? 4 months ago COPD exacerbation ([place])  ? [place], [place] P, DO  ?  ?  ?Future Appointments   ?        ? In 6 days Tyler Pita, MD [place] [place]  ? In 1 month Johnson, Megan P, DO [facility]  ?  ? ?  ?  ?  Passed - CBC within normal limits and completed in the last 12 months  ?  WBC  ?Date Value Ref Range Status  ?08/14/2021 8.9 3.4 - 10.8 x10E3/uL Final  ?07/06/2015 9.1 3.6 - 11.0 K/uL Final  ? ?RBC  ?Date Value Ref Range Status  ?08/14/2021 5.47 (H) 3.77 - 5.28 x10E6/uL Final  ?07/06/2015 5.27 (  H) 3.80 - 5.20 MIL/uL Final  ? ?Hemoglobin  ?Date Value Ref Range Status  ?08/14/2021 16.7 (H) 11.1 - 15.9 g/dL Final  ? ?Hematocrit  ?Date Value Ref Range Status  ?08/14/2021 49.7 (H) 34.0 - 46.6 % Final  ? ?MCHC  ?Date Value Ref Range Status  ?08/14/2021 33.6 31.5 - 35.7 g/dL Final  ?07/06/2015 33.8 32.0 - 36.0 g/dL Final  ? ?MCH  ?Date Value Ref Range Status  ?08/14/2021 30.5 26.6 - 33.0 pg Final  ?07/06/2015 30.1 26.0 - 34.0 pg Final  ? ?MCV  ?Date Value Ref Range Status  ?08/14/2021 91 79 - 97 fL Final  ? ?No results found for: PLTCOUNTKUC, LABPLAT, [place] ?RDW  ?Date Value Ref Range Status  ?08/14/2021 13.0 11.7 - 15.4 % Final  ? ?  ?  ?  ?? atorvastatin (LIPITOR) 80 MG tablet 90 tablet 3  ?  Sig: Take 1 tablet (80 mg total) by mouth daily.  ?  ? Cardiovascular:  Antilipid - Statins Failed - 09/15/2021 12:57 PM  ?  ?  Failed - Lipid Panel in normal range within the last 12 months  ?  Cholesterol, Total  ?Date Value Ref Range Status  ?08/14/2021 154 100 - 199 mg/dL Final  ? ?Cholesterol [place], [place]  ?Date Value Ref Range Status  ?05/23/2018 195 <200 mg/dL Final  ?  Comment:  ?                          Desirable                <200 ?                         Borderline High      200- 239 ?                        High                     >239 ?  ? ?LDL Chol Calc (NIH)  ?Date Value Ref Range Status  ?08/14/2021 84 0 - 99 mg/dL Final  ? ?HDL  ?Date Value Ref Range Status  ?08/14/2021 42 >39 mg/dL Final  ? ?Triglycerides  ?Date Value Ref Range Status  ?08/14/2021 164 (H) 0 - 149 mg/dL Final  ? ?Triglycerides Piccolo,Waived  ?Date Value Ref Range Status  ?05/23/2018 183 (H) <150 mg/dL Final  ?  Comment:  ?                          Normal                   <150 ?                        Borderline High     150 - 199 ?                        High                200 - 499 ?                        Very High                >499 ?  ? ?  ?  ?  Passed - Patient is not pregnant  ?  ?  Passed - Valid encounter within last 12 months  ?  Recent Outpatient Visits   ?      ? 1 month ago Routine general medical examination at a health care facility  ? [place], Megan P, DO  ? 1 month ago Appointment canceled by hospital  ? [facility], [place] P, DO  ? 3 months ago Acute non-recurrent maxillary sinusitis  ? [facility] Vigg, Avanti, MD  ? 4 months ago Primary hypertension  ? [place], Megan P, DO  ? 4 months ago COPD exacerbation ([place])  ? [place], [place] P, DO  ?  ?  ?Future Appointments   ?        ? In 6 days Tyler Pita, MD [place] [facility] [place]  ? In 1 month Johnson, Barb Merino, DO [place]  ?  ? ?  ?  ?  ?? Dulaglutide (TRULICITY) 1.5 TK/3.5WS SOPN 6 mL 1  ?  Sig: Inject 1.5 mg into the skin once a week.  ?  ? Endocrinology:  Diabetes - GLP-1 Receptor Agonists Failed - 09/15/2021 12:57 PM  ?  ?  Failed - HBA1C is between 0 and 7.9 and within 180 days  ?  Hemoglobin A1C  ?Date Value Ref Range Status  ?02/29/2016 6.7  Final  ? ?HB A1C (BAYER DCA - WAIVED)  ?Date Value Ref Range Status  ?08/14/2021 8.9 (H) 4.8 - 5.6 % Final  ?  Comment:  ?            Prediabetes: 5.7 - 6.4 ?         Diabetes: >6.4 ?         Glycemic control for adults with diabetes: <7.0 ?  ?   ?  ?  Passed - Valid encounter within last 6 months  ?  Recent Outpatient Visits   ?      ? 1 month ago Routine general medical examination at a health care facility  ? [place], Megan P, DO  ? 1 month ago Appointment canceled by hospital  ? [facility], [place] P, DO  ? 3 months ago Acute non-recurrent maxillary sinusitis  ? [facility] Vigg, Avanti, MD  ? 4 months ago Primary hypertension  ? [place], Megan P, DO  ? 4 months ago COPD exacerbation ([place])  ? [place], [place] P, DO  ?  ?  ?Future Appointments   ?        ? In 6 days Tyler Pita, MD [facility] [facility] [place]  ? In 1 month Johnson, Barb Merino, DO [facility]  ?  ? ?  ?  ?  ? ?

## 2021-09-15 NOTE — Telephone Encounter (Signed)
ERROR

## 2021-09-16 NOTE — Telephone Encounter (Signed)
Requested medications are due for refill today.  unsure ? ?Requested medications are on the active medications list.  yes ? ?Last refill. 03/19/2021 ? ?Future visit scheduled.   yes ? ?Notes to clinic.  Prescription is signed by historical provider. ? ? ? ?Requested Prescriptions  ?Pending Prescriptions Disp Refills  ? SYMBICORT 160-4.5 MCG/ACT inhaler [Pharmacy Med Name: SYMBICORT 160/4.5MCG (120 ORAL INH)] 30.6 g   ?  Sig: INHALE 2 PUFFS INTO THE LUNGS TWICE DAILY  ?  ? Pulmonology:  Combination Products Passed - 09/14/2021 12:10 PM  ?  ?  Passed - Valid encounter within last 12 months  ?  Recent Outpatient Visits   ? ?      ? 1 month ago Routine general medical examination at a health care facility  ? [place], Megan P, DO  ? 1 month ago Appointment canceled by hospital  ? [facility], [place] P, DO  ? 3 months ago Acute non-recurrent maxillary sinusitis  ? [facility] Vigg, Avanti, MD  ? 4 months ago Primary hypertension  ? [place], Megan P, DO  ? 4 months ago COPD exacerbation ([place])  ? [place], [place] P, DO  ? ?  ?  ?Future Appointments   ? ?        ? In 5 days Tyler Pita, MD [place] [facility] [place]  ? In 1 month Johnson, Barb Merino, DO [facility]  ? ?  ? ?  ?  ?  ?  ?

## 2021-09-17 MED ORDER — ATORVASTATIN CALCIUM 80 MG PO TABS: 80.0000 mg | ORAL_TABLET | Freq: Every day | ORAL | 1 refills | Status: DC

## 2021-09-18 ENCOUNTER — Other Ambulatory Visit: Payer: Self-pay | Admitting: Surgery

## 2021-09-18 DIAGNOSIS — M19011 Primary osteoarthritis, right shoulder: Secondary | ICD-10-CM

## 2021-09-18 DIAGNOSIS — M7581 Other shoulder lesions, right shoulder: Secondary | ICD-10-CM

## 2021-09-21 ENCOUNTER — Encounter: Payer: Self-pay | Admitting: Pulmonary Disease

## 2021-09-21 ENCOUNTER — Ambulatory Visit: Payer: BC Managed Care – PPO | Admitting: Pulmonary Disease

## 2021-09-21 VITALS — BP 140/80 | HR 60 | Temp 97.5°F | Ht 62.0 in | Wt 175.2 lb

## 2021-09-21 DIAGNOSIS — J42 Unspecified chronic bronchitis: Secondary | ICD-10-CM | POA: Diagnosis not present

## 2021-09-21 DIAGNOSIS — F17210 Nicotine dependence, cigarettes, uncomplicated: Secondary | ICD-10-CM

## 2021-09-21 MED ORDER — BREZTRI AEROSPHERE 160-9-4.8 MCG/ACT IN AERO: 2.0000 | INHALATION_SPRAY | Freq: Two times a day (BID) | RESPIRATORY_TRACT | 0 refills | Status: DC

## 2021-09-21 MED ORDER — BREZTRI AEROSPHERE 160-9-4.8 MCG/ACT IN AERO: 2.0000 | INHALATION_SPRAY | Freq: Two times a day (BID) | RESPIRATORY_TRACT | 3 refills | Status: DC

## 2021-09-21 NOTE — Progress Notes (Signed)
Subjective:    Patient ID: Monica Hull, female    DOB: January 27, 1961, 61 y.o.   MRN: [id number] Patient Care Team: Valerie Roys, DO as PCP - General (Family Medicine) Minna Merritts, MD as PCP - Cardiology (Cardiology)  Chief Complaint  Patient presents with   Follow-up   HPI 61 year old female, current every day smoker currently smoking over a pack a day, with 58-pack-year history of smoking. PMH significant for COPD, sleep apnea, allergic rhinitis, GERD, hypertension, carotid stenosis, diet controlled diabetes mellitus, hyperlipidemia.  I last saw the patient on 26 August 2019  in the interim she has seen nurse practitioners as well.  He is maintained on Symbicort and Spiriva.  Uses albuterol as rescue. She states that she has noted wheezing and her cough is daily particularly in the mornings productive of whitish to clear sputum.  Requires preoperative pulmonary evaluation due to upcoming shoulder surgery.  Had increased shortness of breath that is not completely controlled by Symbicort and Spiriva.  She has recently been given a prescription of Chantix by her primary care provider Dr. Park Liter, she has not set a quit date yet.  She has not had PFTs since 2017.  Previously she had been on Daliresp but had to discontinue it due to nausea and poor tolerance of the medication.  She felt that the [place] did help her breathing.  He does not endorse any fever, chills or sweats.  Has daily morning cough productive of whitish to grayish sputum.  No hemoptysis.  No weight loss or anorexia.  No lower extremity edema nor calf tenderness.  No chest pain.  No other complaints voiced.   Review of Systems A 10 point review of systems was performed and it is as noted above otherwise negative.  Patient Active Problem List   Diagnosis Date Noted   Aortic atherosclerosis ([place]) 04/08/2019   Syncope 03/24/2018   Allergic rhinitis 02/27/2017   Current smoker 02/20/2016   Carotid  stenosis 02/16/2016   Tobacco abuse counseling 01/10/2016   Thyroid nodule 11/01/2015   Type 2 diabetes mellitus with other specified complication ([place]) 0000000   PMB (postmenopausal bleeding) 06/30/2015   Obesity (BMI 30.0-34.9) 06/30/2015   Paresthesias 02/01/2015   DJD (degenerative joint disease), cervical 02/01/2015   Stress incontinence 12/21/2014   Asthma    GERD (gastroesophageal reflux disease)    Sleep apnea    Hyperlipidemia    Hypertension    COPD (chronic obstructive pulmonary disease) (HCC)    Nicotine dependence, cigarettes, w unsp disorders    Social History   Tobacco Use   Smoking status: Every Day    Packs/day: 1.50    Years: 39.00    Pack years: 58.50    Types: Cigarettes   Smokeless tobacco: Never   Tobacco comments:    1ppd-1.5ppd - 09/21/2021  Substance Use Topics   Alcohol use: Never   Allergies  Allergen Reactions   Advair Hfa [Fluticasone-Salmeterol] Rash    Rash and thrush   Anoro Ellipta [Umeclidinium-Vilanterol] Rash    Causes patients tongue to break out   Biaxin [Clarithromycin] Nausea And Vomiting and Rash   Fluticasone Rash   Current Meds  Medication Sig   albuterol (PROVENTIL) (2.5 MG/3ML) 0.083% nebulizer solution Take 3 mLs (2.5 mg total) by nebulization every 6 (six) hours as needed for wheezing or shortness of breath.   albuterol (VENTOLIN HFA) 108 (90 Base) MCG/ACT inhaler Inhale 2 puffs into the lungs every 6 (six) hours as  needed for wheezing or shortness of breath.   aspirin EC 81 MG tablet Take 81 mg by mouth daily. Swallow whole.   atorvastatin (LIPITOR) 80 MG tablet Take 1 tablet (80 mg total) by mouth daily.   diclofenac Sodium (VOLTAREN) 1 % GEL Apply 4 g topically 4 (four) times daily.   Dulaglutide (TRULICITY) 1.5 0000000 SOPN Inject 1.5 mg into the skin once a week.   fesoterodine (TOVIAZ) 8 MG TB24 tablet Take 1 tablet (8 mg total) by mouth daily.   fexofenadine (ALLEGRA ALLERGY) 180 MG tablet Take 1 tablet (180 mg  total) by mouth daily.   fluticasone (FLONASE) 50 MCG/ACT nasal spray Place 2 sprays into both nostrils daily.   gabapentin (NEURONTIN) 300 MG capsule Take 300 mg by mouth at bedtime.   lisinopril (ZESTRIL) 40 MG tablet Take 1 tablet (40 mg total) by mouth daily.   metFORMIN (GLUCOPHAGE-XR) 500 MG 24 hr tablet Take 2 tablets (1,000 mg total) by mouth 2 (two) times daily with a meal.   nicotine (NICODERM CQ - DOSED IN MG/24 HOURS) 21 mg/24hr patch Place 1 patch (21 mg total) onto the skin daily.   nystatin (MYCOSTATIN) 100000 UNIT/ML suspension Take 5 mLs (500,000 Units total) by mouth 4 (four) times daily.   pantoprazole (PROTONIX) 40 MG tablet Take 1 tablet (40 mg total) by mouth every morning.   Spacer/Aero-Holding Chambers (AEROCHAMBER MV) inhaler Use as instructed   SYMBICORT 160-4.5 MCG/ACT inhaler Inhale 2 puffs into the lungs 2 (two) times daily.   Tiotropium Bromide Monohydrate (SPIRIVA RESPIMAT) 2.5 MCG/ACT AERS Inhale 2 puffs into the lungs daily.   tiZANidine (ZANAFLEX) 4 MG capsule Take 4 mg by mouth 3 (three) times daily as needed for muscle spasms. Reported on 07/11/2015   triamcinolone cream (KENALOG) 0.1 % Apply 1 application topically 2 (two) times daily.   varenicline (CHANTIX CONTINUING MONTH PAK) 1 MG tablet Take 1 tablet (1 mg total) by mouth 2 (two) times daily.   [DISCONTINUED] roflumilast (DALIRESP) 500 MCG TABS tablet Take 1 tablet (500 mcg total) by mouth daily.   Immunization History  Administered Date(s) Administered   Influenza,inj,Quad PF,6+ Mos 03/07/2015, 02/29/2016, 05/09/2017, 03/24/2018, 03/05/2019, 04/14/2020, 04/25/2021   Influenza-Unspecified 03/25/2014   PFIZER(Purple Top)SARS-COV-2 Vaccination 09/10/2019, 10/06/2019, 04/14/2020   Pneumococcal Polysaccharide-23 09/17/2014   Tdap 12/21/2014   Zoster Recombinat (Shingrix) 02/07/2021      Objective:   Physical Exam BP 140/80 (BP Location: Left Arm, Patient Position: Sitting, Cuff Size: Normal)   Pulse  60   Temp (!) 97.5 F (36.4 C) (Oral)   Ht 5\' 2"  (1.575 m)   Wt 175 lb 3.2 oz (79.5 kg)   SpO2 99%   BMI 32.04 kg/m  GENERAL: Somewhat disheveled, overweight woman, no acute distress, fully ambulatory, melts heavily of tobacco.  No conversational dyspnea.  Looks much older than stated age HEAD: Normocephalic, atraumatic.  EYES: Pupils equal, round, reactive to light.  No scleral icterus.  MOUTH: Very poor dentition.  Oral mucosa moist. NECK: Supple. No thyromegaly. Trachea midline. No JVD.  No adenopathy. PULMONARY: Good air entry bilaterally.  Coarse breath sounds with few rhonchi, no wheezes. CARDIOVASCULAR: S1 and S2. Regular rate and rhythm.  No rubs, murmurs or gallops heard. ABDOMEN: Obese otherwise benign. MUSCULOSKELETAL: No joint deformity, no clubbing, no edema.  NEUROLOGIC: Neuro grossly normal. SKIN: Intact,warm,dry. PSYCH: Mood and behavior normal.  Ambulatory oximetry: No desaturations noted during ambulation.  Baseline oxygen saturation on room air 95%, saturation INCREASED during exercise to 99%.  Assessment & Plan:     ICD-10-CM   1. Chronic bronchitis, unspecified chronic bronchitis type ([place])  J42 Pulmonary Function Test [facility] Only   Trial of Breztri 2 puffs twice a day Discontinue Symbicort/Spiriva PFTs ASAP for surgical risk assessment    2. Tobacco dependence due to cigarettes  F17.210    She has been provided a prescription of Chantix by primary care Needs to set quit date Needs to quit smoking     Orders Placed This Encounter  Procedures   [facility]ulmonary [facility]    Standing Status:   Future    Standing Expiration Date:   09/22/2022    Scheduling Instructions:     ASAP. PT HAVING PROCEDURE ON 10/12/21 NEEDS TEST TO BE CLEARED.    Order Specific Question:   Full PFT: includes the following: basic spirometry, spirometry pre & post bronchodilator, diffusion capacity (DLCO), lung volumes    Answer:   Full PFT   Meds ordered this encounter   Medications   Budeson-Glycopyrrol-Formoterol (BREZTRI AEROSPHERE) 160-9-4.8 MCG/ACT AERO    Sig: Inhale 2 puffs into the lungs in the morning and at bedtime.    Dispense:  5.9 g    Refill:  0    Order Specific Question:   Lot Number?    Answer:   [id number]    Order Specific Question:   Expiration Date?    Answer:   03/24/2024    Order Specific Question:   Manufacturer?    Answer:   [employer] [[id number]]    Order Specific Question:   NDC    AnswerHX:[id number]    Order Specific Question:   Quantity    Answer:   2   Budeson-Glycopyrrol-Formoterol (BREZTRI AEROSPHERE) 160-9-4.8 MCG/ACT AERO    Sig: Inhale 2 puffs into the lungs in the morning and at bedtime.    Dispense:  32.1 g    Refill:  3    This replaces Symbicort plus Spiriva   The patient will see me or the nurse practitioner in 3 weeks time to complete preoperative assessment for upcoming shoulder surgery.  Renold Don, MD Advanced Bronchoscopy PCCM [facility]    *This note was dictated using voice recognition software/Dragon.  Despite best efforts to proofread, errors can occur which can change the meaning. Any transcriptional errors that result from this process are unintentional and may not be fully corrected at the time of dictation.

## 2021-09-21 NOTE — Patient Instructions (Addendum)
We will need to get breathing tests to be able to assess your risk for surgery. ? ?I am giving you an inhaler that is call BREZTRI, this replaces your Symbicort and Spiriva. ? ?Monica Hull is 2 puffs twice a day, make sure you rinse your mouth well after you use it. ? ?DO NOT USE SYMBICORT AND SPIRIVA WHILE ON THE BREZTRI. ? ?We will see you in follow-up in 3 weeks time with one of the nurse practitioners to be able to give at the termination for your ability to tolerate surgery. ?

## 2021-09-25 ENCOUNTER — Other Ambulatory Visit: Payer: Self-pay | Admitting: Surgery

## 2021-09-26 ENCOUNTER — Ambulatory Visit: Payer: BC Managed Care – PPO | Attending: Pulmonary Disease

## 2021-09-26 DIAGNOSIS — J42 Unspecified chronic bronchitis: Secondary | ICD-10-CM

## 2021-09-26 MED ORDER — ALBUTEROL SULFATE (2.5 MG/3ML) 0.083% IN NEBU
2.5000 mg | INHALATION_SOLUTION | Freq: Once | RESPIRATORY_TRACT | Status: AC
Start: 2021-09-26 — End: 2021-09-26
  Administered 2021-09-26: 2.5 mg via RESPIRATORY_TRACT
  Filled 2021-09-26: qty 3

## 2021-09-27 ENCOUNTER — Ambulatory Visit
Admission: RE | Admit: 2021-09-27 | Discharge: 2021-09-27 | Disposition: A | Discharge status: Home / Self Care | Payer: BC Managed Care – PPO | Source: Ambulatory Visit | Attending: Surgery | Admitting: Surgery

## 2021-09-27 DIAGNOSIS — M7581 Other shoulder lesions, right shoulder: Secondary | ICD-10-CM

## 2021-09-27 DIAGNOSIS — M25511 Pain in right shoulder: Secondary | ICD-10-CM | POA: Diagnosis not present

## 2021-09-27 DIAGNOSIS — M19011 Primary osteoarthritis, right shoulder: Secondary | ICD-10-CM

## 2021-09-28 ENCOUNTER — Telehealth: Payer: Self-pay | Admitting: Pulmonary Disease

## 2021-09-28 ENCOUNTER — Ambulatory Visit: Payer: BC Managed Care – PPO | Admitting: Adult Health

## 2021-09-28 ENCOUNTER — Encounter: Payer: Self-pay | Admitting: Adult Health

## 2021-09-28 DIAGNOSIS — Z716 Tobacco abuse counseling: Secondary | ICD-10-CM

## 2021-09-28 DIAGNOSIS — Z01811 Encounter for preprocedural respiratory examination: Secondary | ICD-10-CM | POA: Diagnosis not present

## 2021-09-28 DIAGNOSIS — J418 Mixed simple and mucopurulent chronic bronchitis: Secondary | ICD-10-CM | POA: Diagnosis not present

## 2021-09-28 MED ORDER — AEROCHAMBER MV MISC: 0 refills | Status: DC

## 2021-09-28 NOTE — Progress Notes (Signed)
? ?@Patient  ID: , female    DOB: 1961/01/09, 61 y.o.   MRN: [id number] ? ?Chief Complaint  ?Patient presents with  ? Follow-up  ? ? ?Referring provider: ?[id number], DO ? ?HPI: ?61 year old female active heavy smoker followed for COPD and allergic rhinitis ?Medical history significant for diabetes ? ?TEST/EVENTS :  ?PFTs 02/16/16: Mild obstruction (FEV1 1.39 L, 55% pred > 1.58 L 62% pred), mild restriction, normal DLCO ? ?09/28/2021 Follow up : COPD and AR  ?Patient presents for a 1 week follow-up.  Patient was seen in the office last for a follow-up for COPD.  Patient is a heavy smoker smoking a pack and a half of cigarettes daily.  Last visit she was changed to [facility] inhaler.  Previously on Symbicort and Spiriva.  Patient has daily cough congestion and gets short of breath with activities.  She says right now she is limited by her bilateral shoulder pain.  She has upcoming shoulder surgery and needs pulmonary preop risk assessment.  Previous PFTs in 2017 showed moderate COPD and restriction.  Patient was set up for PFTs which were completed on September 26, 2021 that show stable to improved lung function with FEV1 at 74%, ratio 82, FVC 70%, no significant bronchodilator response, mid flow reversibility, DLCO at 82%..  Discussed her PFT results in detail with patient and her husband. ?Patient says that she is typically active but since she has had shoulder issues she has not been able to do as much.  She is in daily pain. ?Patient is able to do her ADLs. Lives at home with husband . Not on oxygen  ?Discussed smoking cessation. She has recently been started on Chantix by her MD . Discussed quit date and no smoking prior to surgery for at least 1 week.  ?Participates in the lung cancer screening program,  ? ? ?Allergies  ?Allergen Reactions  ? Advair Hfa [Fluticasone-Salmeterol] Rash  ?  Rash and thrush  ? Anoro Ellipta [Umeclidinium-Vilanterol] Rash  ?  Causes patients tongue to break out  ?  Biaxin [Clarithromycin] Nausea And Vomiting and Rash  ? ? ?Immunization History  ?Administered Date(s) Administered  ? Influenza,inj,Quad PF,6+ Mos 03/07/2015, 02/29/2016, 05/09/2017, 03/24/2018, 03/05/2019, 04/14/2020, 04/25/2021  ? Influenza-Unspecified 03/25/2014  ? PFIZER(Purple Top)SARS-COV-2 Vaccination 09/10/2019, 10/06/2019, 04/14/2020  ? Pneumococcal Polysaccharide-23 09/17/2014  ? Tdap 12/21/2014  ? Zoster Recombinat (Shingrix) 02/07/2021  ? ? ?Tobacco History: ?Social History  ? ?Tobacco Use  ?Smoking Status Every Day  ? Packs/day: 1.50  ? Years: 39.00  ? Pack years: 58.50  ? Types: Cigarettes  ?Smokeless Tobacco Never  ?Tobacco Comments  ? 1ppd-1.5ppd - 09/28/2021 hfb  ? ?Ready to quit: Yes ?Counseling given: Yes ?Tobacco comments: 1ppd-1.5ppd - 09/28/2021 hfb ? ? ?Outpatient Medications Prior to Visit  ?Medication Sig Dispense Refill  ? albuterol (PROVENTIL) (2.5 MG/3ML) 0.083% nebulizer solution Take 3 mLs (2.5 mg total) by nebulization every 6 (six) hours as needed for wheezing or shortness of breath. 150 mL 1  ? albuterol (VENTOLIN HFA) 108 (90 Base) MCG/ACT inhaler Inhale 2 puffs into the lungs every 6 (six) hours as needed for wheezing or shortness of breath. 18 g 6  ? aspirin EC 81 MG tablet Take 81 mg by mouth daily. Swallow whole.    ? atorvastatin (LIPITOR) 80 MG tablet Take 1 tablet (80 mg total) by mouth daily. 90 tablet 1  ? Budeson-Glycopyrrol-Formoterol (BREZTRI AEROSPHERE) 160-9-4.8 MCG/ACT AERO Inhale 2 puffs into the lungs in the morning and at  bedtime. 32.1 g 3  ? celecoxib (CELEBREX) 200 MG capsule Take 200 mg by mouth 2 (two) times daily.    ? cetirizine (ZYRTEC) 10 MG tablet Take 10 mg by mouth daily as needed for allergies.    ? diclofenac Sodium (VOLTAREN) 1 % GEL Apply 4 g topically 4 (four) times daily. (Patient taking differently: Apply 4 g topically 4 (four) times daily as needed (pain).) 350 g 6  ? Dulaglutide (TRULICITY) 1.5 MG/0.5ML SOPN Inject 1.5 mg into the skin once a week.  (Patient taking differently: Inject 1.5 mg into the skin every Wednesday.) 6 mL 1  ? fesoterodine (TOVIAZ) 8 MG TB24 tablet Take 1 tablet (8 mg total) by mouth daily. 90 tablet 3  ? fexofenadine (ALLEGRA ALLERGY) 180 MG tablet Take 1 tablet (180 mg total) by mouth daily. (Patient taking differently: Take 180 mg by mouth daily as needed for allergies.) 10 tablet 1  ? fluticasone (FLONASE) 50 MCG/ACT nasal spray Place 2 sprays into both nostrils daily. (Patient taking differently: Place 2 sprays into both nostrils daily as needed for allergies.) 16 g 11  ? linaclotide (LINZESS) 145 MCG CAPS capsule Take 145 mcg by mouth daily before breakfast.    ? lisinopril (ZESTRIL) 40 MG tablet Take 1 tablet (40 mg total) by mouth daily. 90 tablet 1  ? metFORMIN (GLUCOPHAGE-XR) 500 MG 24 hr tablet Take 2 tablets (1,000 mg total) by mouth 2 (two) times daily with a meal. (Patient taking differently: Take 1,000 mg by mouth daily with breakfast.) 360 tablet 1  ? nicotine (NICODERM CQ - DOSED IN MG/24 HOURS) 21 mg/24hr patch Place 1 patch (21 mg total) onto the skin daily. 30 patch 1  ? nystatin (MYCOSTATIN) 100000 UNIT/ML suspension Take 5 mLs (500,000 Units total) by mouth 4 (four) times daily. (Patient taking differently: Take 5 mLs by mouth 4 (four) times daily as needed (thrush).) 60 mL 0  ? pantoprazole (PROTONIX) 40 MG tablet Take 1 tablet (40 mg total) by mouth every morning. 90 tablet 1  ? Spacer/Aero-Holding Chambers (AEROCHAMBER MV) inhaler Use as instructed 1 each 0  ? tiZANidine (ZANAFLEX) 4 MG capsule Take 4 mg by mouth 3 (three) times daily as needed for muscle spasms. Reported on 07/11/2015    ? trolamine salicylate (ASPERCREME) 10 % cream Apply 1 application. topically as needed for muscle pain.    ? varenicline (CHANTIX CONTINUING MONTH PAK) 1 MG tablet Take 1 tablet (1 mg total) by mouth 2 (two) times daily. 60 tablet 3  ? ?Facility-Administered Medications Prior to Visit  ?Medication Dose Route Frequency Provider  Last Rate Last Admin  ? albuterol (PROVENTIL) (2.5 MG/3ML) 0.083% nebulizer solution 2.5 mg  2.5 mg Nebulization Once Laural Benes, Megan P, DO      ? ? ? ?Review of Systems:  ? ?Constitutional:   No  weight loss, night sweats,  Fevers, chills,  ?+fatigue, or  lassitude. ? ?HEENT:   No headaches,  Difficulty swallowing,  Tooth/dental problems, or  Sore throat,  ?              No sneezing, itching, ear ache, nasal congestion, post nasal drip,  ? ?CV:  No chest pain,  Orthopnea, PND, swelling in lower extremities, anasarca, dizziness, palpitations, syncope.  ? ?GI  No heartburn, indigestion, abdominal pain, nausea, vomiting, diarrhea, change in bowel habits, loss of appetite, bloody stools.  ? ?Resp: No shortness of breath with exertion or at rest.  No excess mucus, no productive cough,  No  non-productive cough,  No coughing up of blood.  No change in color of mucus.  No wheezing.  No chest wall deformity ? ?Skin: no rash or lesions. ? ?GU: no dysuria, change in color of urine, no urgency or frequency.  No flank pain, no hematuria  ? ?MS:  shoulder pain . ? ? ? ?Physical Exam ? ?BP (!) 150/90 (BP Location: Left Arm, Patient Position: Sitting, Cuff Size: Normal)   Pulse 68   Temp (!) 97.4 ?F (36.3 ?C) (Oral)   Ht  (1.575 m)   Wt 173 lb 9.6 oz (78.7 kg)   SpO2 97%   BMI 31.75 kg/m?  ? ?GEN: A/Ox3; pleasant , NAD, well nourished  ?  ?HEENT:  [place]/AT,   NOSE-clear, THROAT-clear, no lesions, no postnasal drip or exudate noted.  ? ?NECK:  Supple w/ fair ROM; no JVD; normal carotid impulses w/o bruits; no thyromegaly or nodules palpated; no lymphadenopathy.   ? ?RESP  Clear  P & A; w/o, wheezes/ rales/ or rhonchi. no accessory muscle use, no dullness to percussion ? ?CARD:  RRR, no m/r/g, no peripheral edema, pulses intact, no cyanosis or clubbing. ? ?GI:   Soft & nt; nml bowel sounds; no organomegaly or masses detected.  ? ?Musco: Warm bil, no deformities or joint swelling noted.  ? ?Neuro: alert, no focal deficits  noted.   ? ?Skin: Warm, no lesions or rashes ? ? ? ?Lab Results: ? ? ? ? ? ?BNP ?No results found for: BNP ? ?ProBNP ?No results found for: PROBNP ? ?albuterol (PROVENTIL) (2.5 MG/3ML) 0.083% nebulizer solution 2

## 2021-09-28 NOTE — Telephone Encounter (Signed)
She was taking Symbicort and Spiriva without difficulty.  Her "allergy" is that she does not tolerate powdered medications. ?

## 2021-09-28 NOTE — Assessment & Plan Note (Signed)
Patient has upcoming shoulder surgery.  She is considered a mild to moderate risk with underlying comorbidities.  She has moderate COPD that appears to be compensated at the present time.  She is not oxygen dependent.  She is fully independent and able to do ADLs.  Her shoulder issues are limiting her mobility.  Have encouraged her on smoking cessation.  Went over the potential pulmonary surgical risk. ?Have advised her on quitting smoking prior to surgery for at least 1 week ? ? ? ?Major Pulmonary risks identified in the multifactorial risk analysis are but not limited to a) pneumonia; b) recurrent intubation risk; c) prolonged or recurrent acute respiratory failure needing mechanical ventilation; d) prolonged hospitalization; e) DVT/Pulmonary embolism; f) Acute Pulmonary edema ? ?Recommend ?1. Short duration of surgery as much as possible and avoid paralytic if possible ?2. Recovery in step down or ICU with Pulmonary consultation if indicated.  ?3. DVT prophylaxis if indicated.  ?4. Aggressive pulmonary toilet with o2, bronchodilatation, and incentive spirometry and early ambulation ? ? ? ?

## 2021-09-28 NOTE — Telephone Encounter (Signed)
Jimmie Molly with [facility]  is aware of below message and voiced her understanding.  ?Nothing further needed.  ?

## 2021-09-28 NOTE — Assessment & Plan Note (Signed)
Smoking cessation discussed in detail 

## 2021-09-28 NOTE — Patient Instructions (Addendum)
Continue on Breztri 2 puffs Twice daily , rinse after use .  ?May use with spacer.  ?Albuterol inhaler As needed   ?Discuss with Primary MD that lisinopril may aggravate your cough .  ?Work on quitting smoking , set quit date .  ?No smoking 1 week prior to surgery .  ?Mucinex DM Twice daily  As needed  for cough .  ?Activity as tolerated.  ?Continue with lung cancer CT chest screening .  ?Follow up with Dr. Patsey Berthold in 3 months and As needed   ? ? ?

## 2021-09-28 NOTE — Progress Notes (Signed)
Agree with the details of the visit as noted by Tammy Parrett, NP.  C. Laura Eura Mccauslin, MD [facility] 

## 2021-09-28 NOTE — Telephone Encounter (Signed)
Received call from ED, pharmacist with [facility].  ?He would like to verify [facility]. Patient has allergies to Advair and Anoro. Ed would like to verify that it is okay to proceed with [facility]. ? ?ED's contact number [phone]  ?reference number [id number] ? ?Dr. Jayme Cloud, please advise. Thanks ?

## 2021-09-28 NOTE — Assessment & Plan Note (Signed)
COPD PFTs show stable to improved lung function.  Patient is smoking very heavily.  Long discussion with her regarding smoking cessation.  Patient has recently been started on Chantix by her primary physician.  Advised to set a quit date.  Definitely no smoking 1 week prior to surgery. ?Patient does have a daily cough.  Have suggested that she discuss an alternative for ACE inhibitor as it may be aggravating her daily cough. ?COPD appears to be controlled without acute symptoms-with improved symptom control on Breztri inhaler ? ?Plan  ?Patient Instructions  ?Continue on Breztri 2 puffs Twice daily , rinse after use .  ?May use with spacer.  ?Albuterol inhaler As needed   ?Discuss with Primary MD that lisinopril may aggravate your cough .  ?Work on quitting smoking , set quit date .  ?No smoking 1 week prior to surgery .  ?Mucinex DM Twice daily  As needed  for cough .  ?Activity as tolerated.  ?Continue with lung cancer CT chest screening .  ?Follow up with Dr. Jayme Cloud in 3 months and As needed   ? ? ?  ? ? ?

## 2021-09-29 ENCOUNTER — Inpatient Hospital Stay: Admission: RE | Admit: 2021-09-29 | Payer: BC Managed Care – PPO | Source: Ambulatory Visit

## 2021-10-02 ENCOUNTER — Encounter
Admission: RE | Admit: 2021-10-02 | Discharge: 2021-10-02 | Disposition: A | Discharge status: Home / Self Care | Payer: BC Managed Care – PPO | Source: Ambulatory Visit | Attending: Surgery | Admitting: Surgery

## 2021-10-02 ENCOUNTER — Other Ambulatory Visit: Payer: Self-pay

## 2021-10-02 VITALS — BP 137/97 | HR 71 | Temp 97.8°F | Resp 18 | Ht 62.0 in | Wt 174.5 lb

## 2021-10-02 DIAGNOSIS — Z01812 Encounter for preprocedural laboratory examination: Secondary | ICD-10-CM | POA: Diagnosis not present

## 2021-10-02 DIAGNOSIS — Z01818 Encounter for other preprocedural examination: Secondary | ICD-10-CM

## 2021-10-02 HISTORY — DX: Personal history of urinary calculi: Z87.442

## 2021-10-02 HISTORY — DX: Unspecified osteoarthritis, unspecified site: M19.90

## 2021-10-02 HISTORY — DX: Pneumonia, unspecified organism: J18.9

## 2021-10-02 LAB — URINALYSIS, ROUTINE W REFLEX MICROSCOPIC
Bilirubin Urine: NEGATIVE
Glucose, UA: NEGATIVE mg/dL
Hgb urine dipstick: NEGATIVE
Ketones, ur: NEGATIVE mg/dL
Leukocytes,Ua: NEGATIVE
Nitrite: NEGATIVE
Protein, ur: NEGATIVE mg/dL
Specific Gravity, Urine: 1.008 (ref 1.005–1.030)
pH: 7 (ref 5.0–8.0)

## 2021-10-02 LAB — TYPE AND SCREEN
ABO/RH(D): B POS
Antibody Screen: NEGATIVE

## 2021-10-02 LAB — SURGICAL PCR SCREEN
MRSA, PCR: NEGATIVE
Staphylococcus aureus: NEGATIVE

## 2021-10-02 NOTE — Patient Instructions (Addendum)
Your procedure is scheduled on: Thursday 10/12/21 ?Report to the Registration Desk on the 1st floor of the [facility]. ?To find out your arrival time, please call [phone] between 1PM - 3PM on: Wednesday 10/11/21 ? ?REMEMBER: ?Instructions that are not followed completely may result in serious medical risk, up to and including death; or upon the discretion of your surgeon and anesthesiologist your surgery may need to be rescheduled. ? ?Do not eat food after midnight the night before surgery.  ?No gum chewing, lozengers or hard candies. ? ?You may however, drink water up to 2 hours before you are scheduled to arrive for your surgery. Do not drink anything within 2 hours of your scheduled arrival time. ? ?In addition, your doctor has ordered for you to drink the provided  ?Gatorade G2 ?Drinking this carbohydrate drink up to two hours before surgery helps to reduce insulin resistance and improve patient outcomes. Please complete drinking 2 hours prior to scheduled arrival time. ? ?TAKE THESE MEDICATIONS THE MORNING OF SURGERY WITH A SIP OF WATER: ?celecoxib (CELEBREX) 200 MG capsule ? ?pantoprazole (PROTONIX) 40 MG tablet (take one the night before and one on the morning of surgery - helps to prevent nausea after surgery.) ? ?Use your albuterol (VENTOLIN HFA) 108 (90 Base) MCG/ACT inhaler, Budeson-Glycopyrrol-Formoterol (BREZTRI AEROSPHERE) 160-9-4.8 MCG/ACT AERO inhalers on the day of surgery and bring to the hospital. ? ?Stop taking your metFORMIN (GLUCOPHAGE-XR) 500 MG 24 hr tablet 2 days prior to surgery. Last dose on Monday 10/09/21. ? ?Remove nicotine (NICODERM CQ - DOSED IN MG/24 HOURS) 21 mg/24hr patch. ? ?Hold your 81 mg Aspirin the day of surgery. ?  ?One week prior to surgery: ?Stop Anti-inflammatories (NSAIDS) such as Advil, Aleve, Ibuprofen, Motrin, Naproxen, Naprosyn and Aspirin based products such as Excedrin, Goodys Powder, BC Powder. ? ?Stop taking your ANY OVER THE COUNTER supplements until after  surgery. ? ?You may however, continue to take Tylenol if needed for pain up until the day of surgery. ? ?No Alcohol for 24 hours before or after surgery. ? ?No Smoking including e-cigarettes for 24 hours prior to surgery.  ?No chewable tobacco products for at least 6 hours prior to surgery.  ?No nicotine patches on the day of surgery. ? ?Do not use any "recreational" drugs for at least a week prior to your surgery.  ?Please be advised that the combination of cocaine and anesthesia may have negative outcomes, up to and including death. ?If you test positive for cocaine, your surgery will be cancelled. ? ?On the morning of surgery brush your teeth with toothpaste and water, you may rinse your mouth with mouthwash if you wish. ?Do not swallow any toothpaste or mouthwash. ? ?Use CHG Soap as directed on instruction sheet. ? ?Do not wear jewelry, make-up, hairpins, clips or nail polish. ? ?Do not wear lotions, powders, or perfumes.  ? ?Do not shave body from the neck down 48 hours prior to surgery just in case you cut yourself which could leave a site for infection.  ?Also, freshly shaved skin may become irritated if using the CHG soap. ? ?Do not bring valuables to the hospital. [facility] is not responsible for any missing/lost belongings or valuables.  ? ?Total Shoulder Arthroplasty:  use Benzolyl Peroxide 5% Gel as directed on instruction sheet. ? ?Notify your doctor if there is any change in your medical condition (cold, fever, infection). ? ?Wear comfortable clothing (specific to your surgery type) to the hospital. ? ?After surgery, you can help  prevent lung complications by doing breathing exercises.  ?Take deep breaths and cough every 1-2 hours. Your doctor may order a device called an Incentive Spirometer to help you take deep breaths. ? ?If you are being admitted to the hospital overnight, leave your suitcase in the car. ?After surgery it may be brought to your room. ? ?If you are being discharged the day of  surgery, you will not be allowed to drive home. ?You will need a responsible adult (18 years or older) to drive you home and stay with you that night.  ? ?If you are taking public transportation, you will need to have a responsible adult (18 years or older) with you. ?Please confirm with your physician that it is acceptable to use public transportation.  ? ?Please call the Pre-admissions Testing Dept. at [phone] if you have any questions about these instructions. ? ?Surgery Visitation Policy: ? ?Patients undergoing a surgery or procedure may have two family members or support persons with them as long as the person is not COVID-19 positive or experiencing its symptoms.  ? ?Inpatient Visitation:   ? ?Visiting hours are 7 a.m. to 8 p.m. ?Up to four visitors are allowed at one time in a patient room, including children. The visitors may rotate out with other people during the day. One designated support person (adult) may remain overnight.  ?

## 2021-10-12 ENCOUNTER — Ambulatory Visit: Payer: BC Managed Care – PPO

## 2021-10-12 ENCOUNTER — Ambulatory Visit: Payer: BC Managed Care – PPO | Admitting: Anesthesiology

## 2021-10-12 ENCOUNTER — Other Ambulatory Visit: Payer: Self-pay

## 2021-10-12 ENCOUNTER — Ambulatory Visit
Admission: RE | Admit: 2021-10-12 | Discharge: 2021-10-12 | Disposition: A | Discharge status: Home / Self Care | Payer: BC Managed Care – PPO | Attending: Surgery | Admitting: Surgery

## 2021-10-12 ENCOUNTER — Encounter
Admission: RE | Disposition: A | Discharge status: Home / Self Care | Payer: Self-pay | Source: Home / Self Care | Attending: Surgery

## 2021-10-12 ENCOUNTER — Encounter: Payer: Self-pay | Admitting: Surgery

## 2021-10-12 DIAGNOSIS — E785 Hyperlipidemia, unspecified: Secondary | ICD-10-CM | POA: Diagnosis not present

## 2021-10-12 DIAGNOSIS — M7581 Other shoulder lesions, right shoulder: Secondary | ICD-10-CM | POA: Diagnosis not present

## 2021-10-12 DIAGNOSIS — N189 Chronic kidney disease, unspecified: Secondary | ICD-10-CM | POA: Diagnosis not present

## 2021-10-12 DIAGNOSIS — I129 Hypertensive chronic kidney disease with stage 1 through stage 4 chronic kidney disease, or unspecified chronic kidney disease: Secondary | ICD-10-CM | POA: Insufficient documentation

## 2021-10-12 DIAGNOSIS — E1122 Type 2 diabetes mellitus with diabetic chronic kidney disease: Secondary | ICD-10-CM | POA: Insufficient documentation

## 2021-10-12 DIAGNOSIS — Z96611 Presence of right artificial shoulder joint: Secondary | ICD-10-CM | POA: Diagnosis not present

## 2021-10-12 DIAGNOSIS — M25511 Pain in right shoulder: Secondary | ICD-10-CM | POA: Diagnosis not present

## 2021-10-12 DIAGNOSIS — F17210 Nicotine dependence, cigarettes, uncomplicated: Secondary | ICD-10-CM | POA: Insufficient documentation

## 2021-10-12 DIAGNOSIS — J449 Chronic obstructive pulmonary disease, unspecified: Secondary | ICD-10-CM | POA: Diagnosis not present

## 2021-10-12 DIAGNOSIS — M19011 Primary osteoarthritis, right shoulder: Secondary | ICD-10-CM | POA: Insufficient documentation

## 2021-10-12 DIAGNOSIS — G8918 Other acute postprocedural pain: Secondary | ICD-10-CM | POA: Diagnosis not present

## 2021-10-12 DIAGNOSIS — K219 Gastro-esophageal reflux disease without esophagitis: Secondary | ICD-10-CM | POA: Insufficient documentation

## 2021-10-12 DIAGNOSIS — M7521 Bicipital tendinitis, right shoulder: Secondary | ICD-10-CM | POA: Diagnosis not present

## 2021-10-12 HISTORY — PX: REVERSE SHOULDER ARTHROPLASTY: SHX5054

## 2021-10-12 LAB — ABO/RH: ABO/RH(D): B POS

## 2021-10-12 LAB — GLUCOSE, CAPILLARY
Glucose-Capillary: 145 mg/dL — ABNORMAL HIGH (ref 70–99)
Glucose-Capillary: 182 mg/dL — ABNORMAL HIGH (ref 70–99)

## 2021-10-12 SURGERY — ARTHROPLASTY, SHOULDER, TOTAL, REVERSE
Anesthesia: General | Site: Shoulder | Laterality: Right

## 2021-10-12 MED ORDER — CHLORHEXIDINE GLUCONATE 0.12 % MT SOLN
OROMUCOSAL | Status: AC
  Filled 2021-10-12: qty 15

## 2021-10-12 MED ORDER — CEFAZOLIN SODIUM-DEXTROSE 2-4 GM/100ML-% IV SOLN: 2.0000 g | Freq: Four times a day (QID) | INTRAVENOUS | Status: DC

## 2021-10-12 MED ORDER — MIDAZOLAM HCL 2 MG/2ML IJ SOLN: 1.0000 mg | Freq: Once | INTRAMUSCULAR | Status: AC

## 2021-10-12 MED ORDER — TRANEXAMIC ACID 1000 MG/10ML IV SOLN
INTRAVENOUS | Status: DC | PRN
  Administered 2021-10-12: 1000 mg via TOPICAL

## 2021-10-12 MED ORDER — METFORMIN HCL ER 500 MG PO TB24: 1000.0000 mg | ORAL_TABLET | Freq: Every day | ORAL | Status: DC

## 2021-10-12 MED ORDER — ONDANSETRON HCL 4 MG/2ML IJ SOLN
INTRAMUSCULAR | Status: DC | PRN
  Administered 2021-10-12: 4 mg via INTRAVENOUS

## 2021-10-12 MED ORDER — SUGAMMADEX SODIUM 200 MG/2ML IV SOLN
INTRAVENOUS | Status: DC | PRN
  Administered 2021-10-12: 200 mg via INTRAVENOUS

## 2021-10-12 MED ORDER — KETOROLAC TROMETHAMINE 15 MG/ML IJ SOLN: 15.0000 mg | Freq: Once | INTRAMUSCULAR | Status: AC

## 2021-10-12 MED ORDER — PHENYLEPHRINE HCL-NACL 20-0.9 MG/250ML-% IV SOLN
INTRAVENOUS | Status: DC | PRN
  Administered 2021-10-12: 40 ug/min via INTRAVENOUS

## 2021-10-12 MED ORDER — OXYCODONE HCL 5 MG PO TABS: 5.0000 mg | ORAL_TABLET | ORAL | Status: DC | PRN

## 2021-10-12 MED ORDER — BUPIVACAINE HCL (PF) 0.5 % IJ SOLN
INTRAMUSCULAR | Status: AC
  Filled 2021-10-12: qty 10

## 2021-10-12 MED ORDER — ACETAMINOPHEN 500 MG PO TABS: 1000.0000 mg | ORAL_TABLET | Freq: Four times a day (QID) | ORAL | Status: DC

## 2021-10-12 MED ORDER — SODIUM CHLORIDE 0.9 % IR SOLN
Status: DC | PRN
  Administered 2021-10-12: 3000 mL

## 2021-10-12 MED ORDER — CEFAZOLIN SODIUM-DEXTROSE 2-4 GM/100ML-% IV SOLN
2.0000 g | INTRAVENOUS | Status: AC
  Administered 2021-10-12: 2 g via INTRAVENOUS

## 2021-10-12 MED ORDER — BUPIVACAINE HCL (PF) 0.5 % IJ SOLN
INTRAMUSCULAR | Status: DC | PRN
  Administered 2021-10-12: 3 mL via PERINEURAL
  Administered 2021-10-12: 7 mL via PERINEURAL

## 2021-10-12 MED ORDER — OXYCODONE HCL 5 MG PO TABS: 5.0000 mg | ORAL_TABLET | Freq: Once | ORAL | Status: DC | PRN

## 2021-10-12 MED ORDER — OXYCODONE HCL 5 MG/5ML PO SOLN: 5.0000 mg | Freq: Once | ORAL | Status: DC | PRN

## 2021-10-12 MED ORDER — ACETAMINOPHEN 10 MG/ML IV SOLN
INTRAVENOUS | Status: AC
  Filled 2021-10-12: qty 100

## 2021-10-12 MED ORDER — BUPIVACAINE LIPOSOME 1.3 % IJ SUSP
INTRAMUSCULAR | Status: DC | PRN
  Administered 2021-10-12: 7 mL via PERINEURAL
  Administered 2021-10-12: 13 mL via PERINEURAL

## 2021-10-12 MED ORDER — OXYCODONE HCL 5 MG PO TABS: 5.0000 mg | ORAL_TABLET | ORAL | 0 refills | Status: DC | PRN

## 2021-10-12 MED ORDER — CHLORHEXIDINE GLUCONATE 0.12 % MT SOLN
15.0000 mL | Freq: Once | OROMUCOSAL | Status: DC
Start: 2021-10-12 — End: 2021-10-12

## 2021-10-12 MED ORDER — NALOXONE HCL 0.4 MG/ML IJ SOLN
INTRAMUSCULAR | Status: DC | PRN
  Administered 2021-10-12: .4 mg via INTRAVENOUS

## 2021-10-12 MED ORDER — KETOROLAC TROMETHAMINE 15 MG/ML IJ SOLN
INTRAMUSCULAR | Status: AC
  Administered 2021-10-12: 15 mg via INTRAVENOUS
  Filled 2021-10-12: qty 1

## 2021-10-12 MED ORDER — ROCURONIUM BROMIDE 10 MG/ML (PF) SYRINGE
PREFILLED_SYRINGE | INTRAVENOUS | Status: AC
  Filled 2021-10-12: qty 10

## 2021-10-12 MED ORDER — FENTANYL CITRATE PF 50 MCG/ML IJ SOSY: 50.0000 ug | PREFILLED_SYRINGE | Freq: Once | INTRAMUSCULAR | Status: AC

## 2021-10-12 MED ORDER — TRANEXAMIC ACID 1000 MG/10ML IV SOLN
INTRAVENOUS | Status: AC
  Filled 2021-10-12: qty 10

## 2021-10-12 MED ORDER — MIDAZOLAM HCL 2 MG/2ML IJ SOLN
INTRAMUSCULAR | Status: AC
  Administered 2021-10-12: 1 mg via INTRAVENOUS
  Filled 2021-10-12: qty 2

## 2021-10-12 MED ORDER — METOCLOPRAMIDE HCL 5 MG/ML IJ SOLN: 5.0000 mg | Freq: Three times a day (TID) | INTRAMUSCULAR | Status: DC | PRN

## 2021-10-12 MED ORDER — SODIUM CHLORIDE 0.9 % IV SOLN: INTRAVENOUS | Status: DC

## 2021-10-12 MED ORDER — METOCLOPRAMIDE HCL 10 MG PO TABS: 5.0000 mg | ORAL_TABLET | Freq: Three times a day (TID) | ORAL | Status: DC | PRN

## 2021-10-12 MED ORDER — ROCURONIUM BROMIDE 100 MG/10ML IV SOLN
INTRAVENOUS | Status: DC | PRN
  Administered 2021-10-12: 30 mg via INTRAVENOUS
  Administered 2021-10-12: 50 mg via INTRAVENOUS
  Administered 2021-10-12: 20 mg via INTRAVENOUS

## 2021-10-12 MED ORDER — BUPIVACAINE LIPOSOME 1.3 % IJ SUSP
INTRAMUSCULAR | Status: AC
  Filled 2021-10-12: qty 20

## 2021-10-12 MED ORDER — FENTANYL CITRATE (PF) 100 MCG/2ML IJ SOLN: 25.0000 ug | INTRAMUSCULAR | Status: DC | PRN

## 2021-10-12 MED ORDER — FENTANYL CITRATE (PF) 100 MCG/2ML IJ SOLN
INTRAMUSCULAR | Status: DC | PRN
  Administered 2021-10-12 (×4): 25 ug via INTRAVENOUS

## 2021-10-12 MED ORDER — PHENYLEPHRINE 80 MCG/ML (10ML) SYRINGE FOR IV PUSH (FOR BLOOD PRESSURE SUPPORT)
PREFILLED_SYRINGE | INTRAVENOUS | Status: DC | PRN
Start: 2021-10-12 — End: 2021-10-12
  Administered 2021-10-12 (×6): 80 ug via INTRAVENOUS

## 2021-10-12 MED ORDER — 0.9 % SODIUM CHLORIDE (POUR BTL) OPTIME
TOPICAL | Status: DC | PRN
  Administered 2021-10-12: 1000 mL

## 2021-10-12 MED ORDER — DEXAMETHASONE SODIUM PHOSPHATE 10 MG/ML IJ SOLN
INTRAMUSCULAR | Status: AC
  Filled 2021-10-12: qty 1

## 2021-10-12 MED ORDER — DEXAMETHASONE SODIUM PHOSPHATE 10 MG/ML IJ SOLN
INTRAMUSCULAR | Status: DC | PRN
  Administered 2021-10-12: 8 mg via INTRAVENOUS

## 2021-10-12 MED ORDER — FEXOFENADINE HCL 180 MG PO TABS: 180.0000 mg | ORAL_TABLET | Freq: Every day | ORAL | Status: DC | PRN

## 2021-10-12 MED ORDER — PHENYLEPHRINE 80 MCG/ML (10ML) SYRINGE FOR IV PUSH (FOR BLOOD PRESSURE SUPPORT)
PREFILLED_SYRINGE | INTRAVENOUS | Status: AC
  Filled 2021-10-12: qty 10

## 2021-10-12 MED ORDER — BUPIVACAINE-EPINEPHRINE (PF) 0.5% -1:200000 IJ SOLN
INTRAMUSCULAR | Status: DC | PRN
  Administered 2021-10-12: 30 mL

## 2021-10-12 MED ORDER — ORAL CARE MOUTH RINSE: 15.0000 mL | Freq: Once | OROMUCOSAL | Status: DC

## 2021-10-12 MED ORDER — PHENYLEPHRINE HCL (PRESSORS) 10 MG/ML IV SOLN: INTRAVENOUS | Status: DC | PRN

## 2021-10-12 MED ORDER — CEFAZOLIN SODIUM-DEXTROSE 2-4 GM/100ML-% IV SOLN
INTRAVENOUS | Status: AC
  Administered 2021-10-12: 2 g via INTRAVENOUS
  Filled 2021-10-12: qty 100

## 2021-10-12 MED ORDER — FENTANYL CITRATE (PF) 100 MCG/2ML IJ SOLN
INTRAMUSCULAR | Status: AC
  Filled 2021-10-12: qty 2

## 2021-10-12 MED ORDER — ACETAMINOPHEN 10 MG/ML IV SOLN
INTRAVENOUS | Status: DC | PRN
  Administered 2021-10-12: 1000 mg via INTRAVENOUS

## 2021-10-12 MED ORDER — DEXMEDETOMIDINE (PRECEDEX) IN NS 20 MCG/5ML (4 MCG/ML) IV SYRINGE
PREFILLED_SYRINGE | INTRAVENOUS | Status: DC | PRN
  Administered 2021-10-12: 12 ug via INTRAVENOUS

## 2021-10-12 MED ORDER — BUPIVACAINE-EPINEPHRINE (PF) 0.5% -1:200000 IJ SOLN
INTRAMUSCULAR | Status: AC
  Filled 2021-10-12: qty 30

## 2021-10-12 MED ORDER — PROPOFOL 10 MG/ML IV BOLUS
INTRAVENOUS | Status: DC | PRN
  Administered 2021-10-12: 20 mg via INTRAVENOUS
  Administered 2021-10-12: 50 mg via INTRAVENOUS
  Administered 2021-10-12: 100 mg via INTRAVENOUS

## 2021-10-12 MED ORDER — CEFAZOLIN SODIUM-DEXTROSE 2-4 GM/100ML-% IV SOLN
INTRAVENOUS | Status: AC
  Filled 2021-10-12: qty 100

## 2021-10-12 MED ORDER — ONDANSETRON HCL 4 MG/2ML IJ SOLN
INTRAMUSCULAR | Status: AC
  Filled 2021-10-12: qty 2

## 2021-10-12 MED ORDER — ONDANSETRON HCL 4 MG PO TABS: 4.0000 mg | ORAL_TABLET | Freq: Four times a day (QID) | ORAL | Status: DC | PRN

## 2021-10-12 MED ORDER — ONDANSETRON HCL 4 MG/2ML IJ SOLN: 4.0000 mg | Freq: Four times a day (QID) | INTRAMUSCULAR | Status: DC | PRN

## 2021-10-12 MED ORDER — FENTANYL CITRATE PF 50 MCG/ML IJ SOSY
PREFILLED_SYRINGE | INTRAMUSCULAR | Status: AC
  Administered 2021-10-12: 50 ug via INTRAVENOUS
  Filled 2021-10-12: qty 1

## 2021-10-12 MED ORDER — TRULICITY 1.5 MG/0.5ML ~~LOC~~ SOAJ
1.5000 mg | SUBCUTANEOUS | Status: DC
Start: 2021-10-18 — End: 2022-02-06

## 2021-10-12 MED ORDER — FLUTICASONE PROPIONATE 50 MCG/ACT NA SUSP: 2.0000 | Freq: Every day | NASAL | Status: DC | PRN

## 2021-10-12 SURGICAL SUPPLY — 69 items
APL PRP STRL LF DISP 70% ISPRP (MISCELLANEOUS) ×1
BASEPLATE GLENOSPHERE 25 (Plate) ×1 IMPLANT
BEARING HUMERAL SHLDER 36M STD (Shoulder) IMPLANT
BIT DRILL TWIST 2.7 (BIT) ×1 IMPLANT
BLADE SAW SAG 25X90X1.19 (BLADE) ×2 IMPLANT
BRNG HUM STD 36 RVRS SHLDR (Shoulder) ×1 IMPLANT
CHLORAPREP W/TINT 26 (MISCELLANEOUS) ×2 IMPLANT
COOLER POLAR GLACIER W/PUMP (MISCELLANEOUS) ×2 IMPLANT
COVER BACK TABLE REUSABLE LG (DRAPES) ×2 IMPLANT
DRAPE 3/4 80X56 (DRAPES) ×2 IMPLANT
DRAPE INCISE IOBAN 66X45 STRL (DRAPES) ×2 IMPLANT
DRSG OPSITE POSTOP 4X8 (GAUZE/BANDAGES/DRESSINGS) ×2 IMPLANT
ELECT BLADE 6.5 EXT (BLADE) IMPLANT
ELECT CAUTERY BLADE 6.4 (BLADE) ×2 IMPLANT
ELECT REM PT RETURN 9FT ADLT (ELECTROSURGICAL) ×2
ELECTRODE REM PT RTRN 9FT ADLT (ELECTROSURGICAL) ×1 IMPLANT
GAUZE XEROFORM 1X8 LF (GAUZE/BANDAGES/DRESSINGS) ×2 IMPLANT
GLENOID SPHERE STD STRL 36MM (Orthopedic Implant) ×1 IMPLANT
GLOVE BIOGEL PI IND STRL 8 (GLOVE) ×2 IMPLANT
GLOVE BIOGEL PI INDICATOR 8 (GLOVE) ×2
GLOVE SURG ENC MOIS LTX SZ7.5 (GLOVE) ×8 IMPLANT
GLOVE SURG ENC MOIS LTX SZ8 (GLOVE) ×8 IMPLANT
GLOVE SURG UNDER LTX SZ8 (GLOVE) ×2 IMPLANT
GOWN STRL REUS W/ TWL LRG LVL3 (GOWN DISPOSABLE) ×1 IMPLANT
GOWN STRL REUS W/ TWL XL LVL3 (GOWN DISPOSABLE) ×1 IMPLANT
GOWN STRL REUS W/TWL LRG LVL3 (GOWN DISPOSABLE) ×2
GOWN STRL REUS W/TWL XL LVL3 (GOWN DISPOSABLE) ×2
HOOD PEEL AWAY FLYTE STAYCOOL (MISCELLANEOUS) ×7 IMPLANT
IV NS IRRIG 3000ML ARTHROMATIC (IV SOLUTION) ×2 IMPLANT
KIT STABILIZATION SHOULDER (MISCELLANEOUS) ×2 IMPLANT
KIT TURNOVER KIT A (KITS) ×2 IMPLANT
MANIFOLD NEPTUNE II (INSTRUMENTS) ×2 IMPLANT
MASK FACE SPIDER DISP (MASK) ×2 IMPLANT
MAT ABSORB  FLUID 56X50 GRAY (MISCELLANEOUS) ×1
MAT ABSORB FLUID 56X50 GRAY (MISCELLANEOUS) ×1 IMPLANT
NDL MAYO CATGUT SZ1 (NEEDLE) IMPLANT
NDL SAFETY ECLIPSE 18X1.5 (NEEDLE) ×1 IMPLANT
NDL SPNL 20GX3.5 QUINCKE YW (NEEDLE) ×1 IMPLANT
NEEDLE HYPO 18GX1.5 SHARP (NEEDLE) ×2
NEEDLE MAYO CATGUT SZ1 (NEEDLE) IMPLANT
NEEDLE SPNL 20GX3.5 QUINCKE YW (NEEDLE) ×2 IMPLANT
NS IRRIG 500ML POUR BTL (IV SOLUTION) ×2 IMPLANT
PACK ARTHROSCOPY SHOULDER (MISCELLANEOUS) ×2 IMPLANT
PAD ARMBOARD 7.5X6 YLW CONV (MISCELLANEOUS) ×2 IMPLANT
PAD WRAPON POLAR SHDR UNIV (MISCELLANEOUS) ×1 IMPLANT
PIN HUMERAL STMN 3.2MMX9IN (INSTRUMENTS) ×1 IMPLANT
PULSAVAC PLUS IRRIG FAN TIP (DISPOSABLE) ×2
SCREW BONE LOCKING 4.75X30X3.5 (Screw) ×1 IMPLANT
SCREW BONE STRL 6.5MMX30MM (Screw) ×1 IMPLANT
SCREW LOCKING 4.75MMX15MM (Screw) ×1 IMPLANT
SCREW LOCKING NS 4.75MMX20MM (Screw) ×1 IMPLANT
SCREW NON-LOCK 4.75X20X3.5 (Screw) ×1 IMPLANT
SHOULDER HUMERAL BEAR 36M STD (Shoulder) ×2 IMPLANT
SLING ULTRA II M (MISCELLANEOUS) ×1 IMPLANT
SPONGE T-LAP 18X18 ~~LOC~~+RFID (SPONGE) ×4 IMPLANT
STAPLER SKIN PROX 35W (STAPLE) ×2 IMPLANT
STEM HUMERAL MICRO SZ11 (Stem) ×1 IMPLANT
SUT ETHIBOND 0 MO6 C/R (SUTURE) ×2 IMPLANT
SUT FIBERWIRE #2 38 BLUE 1/2 (SUTURE) ×8
SUT VIC AB 0 CT1 36 (SUTURE) ×2 IMPLANT
SUT VIC AB 2-0 CT1 27 (SUTURE) ×4
SUT VIC AB 2-0 CT1 TAPERPNT 27 (SUTURE) ×2 IMPLANT
SUTURE FIBERWR #2 38 BLUE 1/2 (SUTURE) ×4 IMPLANT
SYR 10ML LL (SYRINGE) ×2 IMPLANT
SYR 30ML LL (SYRINGE) ×2 IMPLANT
TIP FAN IRRIG PULSAVAC PLUS (DISPOSABLE) ×1 IMPLANT
TRAY HUMERAL NEUTRAL EXT 6 (Shoulder) ×1 IMPLANT
WATER STERILE IRR 500ML POUR (IV SOLUTION) ×2 IMPLANT
WRAPON POLAR PAD SHDR UNIV (MISCELLANEOUS) ×2

## 2021-10-12 NOTE — Progress Notes (Signed)
Patient is awake/alert, shoulder xray completed. ?Reviewed procedure with patient, IS done at bedside in pacu, reviewed technique.  Patient states she tried to stop smoking two weeks prior to surgery  "I tried" ?Encouraged C&DB, to have OT prior to discharge and IV ABX at around 1517. ?Sats >then 94% on RA ?Please note:  LUE purplish in color:  BASELINE for patient, radial pulse +2. ?

## 2021-10-12 NOTE — H&P (Signed)
History of Present Illness:  ?Monica Hull is a 61 y.o. female who presents today as a result of a referral by Dr. Dorthula Nettles for right shoulder pain.  ? ?The patient's symptoms began many years ago and developed without any specific cause or injury. She has been followed by Dr. Regan Lemming over the past several years, who has provided intra-articular injections under ultrasound guidance which have provided temporary partial relief. The patient's most recent injection was on 07/04/2021 which only helped for a few days. Therefore, the patient has been referred to me for further evaluation and treatment. The patient describes the symptoms as marked (major pain with significant limitations) and have the quality of being aching, nagging, miserable, stabbing, tender and throbbing. The pain is localized to the lateral arm/shoulder and localized to the anterior shoulder. These symptoms are aggravated constantly, with normal daily activities and with sleeping. She has tried acetaminophen, non-steroidal anti-inflammatories (Voltaren) and Aspercreme with no significant benefit. She has tried rest and heat with limited benefit. She has tried an injection into the glenohumeral joint numerous times with the results as described above. She denies any neck pain, nor does she note any numbness or paresthesias down her arm to her hand. This complaint is not work related. She is a sports non-participant. ? ?Shoulder Surgical History:  ?The patient has had no shoulder surgery in the past. ? ?PMH/PSH/Family History/Social History/Meds/Allergies:  ?I have reviewed past medical, surgical, social and family history, medications and allergies as documented in the EMR. ? ?Current Outpatient Medications: ? albuterol 90 mcg/actuation inhaler Inhale into the lungs  ? aspirin 81 MG EC tablet Take by mouth  ? atorvastatin (LIPITOR) 80 MG tablet Take by mouth  ? budesonide-formoteroL (SYMBICORT) 160-4.5 mcg/actuation inhaler Inhale into the  lungs 2 (two) times daily  ? cetirizine (ZYRTEC) 10 MG tablet Take 1 tablet by mouth once daily  ? dulaglutide (TRULICITY) 0.75 mg/0.5 mL subcutaneous pen injector Inject 0.75 mg subcutaneously every 7 (seven) days  ? fesoterodine (TOVIAZ) 8 mg ER tablet Take 1 tablet by mouth once daily  ? fluticasone propionate (FLONASE) 50 mcg/actuation nasal spray by Nasal route  ? gabapentin (NEURONTIN) 300 MG capsule Take 1 capsule (300 mg total) by mouth nightly for 30 days 30 capsule 0  ? linaCLOtide (LINZESS) 145 mcg capsule Take 1 capsule (145 mcg total) by mouth once daily for 30 days Take 30 mins before food 30 capsule 6  ? lisinopriL (ZESTRIL) 30 MG tablet Take 1 tablet by mouth once daily  ? metFORMIN (GLUCOPHAGE-XR) 500 MG XR tablet Take 1 tablet by mouth once daily  ? nystatin (MYCOSTATIN) 100,000 unit/mL suspension Take by mouth  ? ondansetron (ZOFRAN-ODT) 4 MG disintegrating tablet Take 1 tablet (4 mg total) by mouth every 8 (eight) hours as needed for Nausea for up to 12 doses 12 tablet 0  ? pantoprazole (PROTONIX) 40 MG DR tablet Take 1 tablet by mouth every morning  ? SPIRIVA RESPIMAT 2.5 mcg/actuation inhalation spray  ? tiotropium (SPIRIVA) 18 mcg inhalation capsule Place 18 mcg into inhaler and inhale once daily  ? tiZANidine (ZANAFLEX) 4 MG capsule Take 1 capsule by mouth 3 (three) times a day  ? ?Allergies:  ? Clarithromycin Nausea, Rash and Vomiting  ? Fluticasone Rash  ? Umeclidinium-Vilanterol Rash  ? ?Past Medical History:  ? Asthma, unspecified asthma severity, unspecified whether complicated, unspecified whether persistent  ? Chronic kidney disease  ? COPD (chronic obstructive pulmonary disease) (CMS-HCC)  ? Diabetes mellitus without complication (CMS-HCC)  ?  GERD (gastroesophageal reflux disease)  ? Hyperlipidemia  ? Hypertension  ? Impaired fasting glucose  ? Migraines  ? Sleep apnea  ? Tobacco abuse  ? ?Past Surgical History:  ? CESAREAN SECTION  ? ENDOSCOPIC CARPAL TUNNEL RELEASE  ? HYSTEROSCOPY   ? PALATE / UVULA BIOPSY / EXCISION  ? ?Family History:  ? Arthritis Mother  ? Asthma Mother  ? Diabetes type II Mother  ? Heart disease Mother  ? Kidney disease Mother  ? Lung disease Mother  ? Pancreatic cancer Mother  ? Alcohol abuse Father  ? High blood pressure (Hypertension) Father  ? Diabetes type II Brother  ? Breast cancer Maternal Aunt  ? ?Social History:  ? ?Socioeconomic History:  ? Marital status: Married  ?Tobacco Use  ? Smoking status: Every Day  ? Smokeless tobacco: Never  ?Vaping Use  ? Vaping Use: Never used  ?Substance and Sexual Activity  ? Alcohol use: Never  ? ?Review of Systems:  ?A comprehensive 14 point ROS was performed, reviewed, and the pertinent orthopaedic findings are documented in the HPI. ? ?Physical Exam:  ?Vitals:  ?09/15/21 1421  ?BP: 118/86  ?Weight: 78.8 kg (173 lb 12.8 oz)  ?Height: 154.9 cm (5\' 1" )  ?PainSc: 9  ?PainLoc: Shoulder  ? ?General/Constitutional: The patient appears to be well-nourished, well-developed, and in no acute distress. ?Neuro/Psych: Normal mood and affect, oriented to person, place and time. ?Eyes: Non-icteric. Pupils are equal, round, and reactive to light, and exhibit synchronous movement. ?ENT: Unremarkable. ?Lymphatic: No palpable adenopathy. ?Respiratory: Lungs clear to auscultation, Normal chest excursion, No wheezes and Non-labored breathing ?Cardiovascular: Regular rate and rhythm. No murmurs. and No edema, swelling or tenderness, except as noted in detailed exam. ?Integumentary: No impressive skin lesions present, except as noted in detailed exam. ?Musculoskeletal: Unremarkable, except as noted in detailed exam. ? ?Right shoulder exam: ?SKIN: normal ?SWELLING: none ?WARMTH: none ?LYMPH NODES: no adenopathy palpable ?CREPITUS: Mild glenohumeral crepitance ?TENDERNESS: Mild tenderness palpation over the anterolateral aspect of the shoulder ?ROM (active):  ?Forward flexion: 30 degrees ?Abduction: 10 degrees ?Internal rotation: Refuses to attempt ?ROM  (passive):  ?Forward flexion: 45 degrees ?Abduction: 25 degrees  ?ER/IR at 90 abd: Not evaluated ? ?She describes significant pain with all motions. ? ?STRENGTH: Forward flexion: Not tested today ?Abduction: 4/5 (with arm at her side) ?External rotation: 3+-4/5 ?Internal rotation: 4/5 ?Pain with RC testing: Yes ? ?STABILITY: Normal ? ?SPECIAL TESTS: ' test: Not evaluated ?Speed's test: Not evaluated ?Capsulitis - pain w/ passive ER: Not evaluated ?Crossed arm test: Not evaluated ?Crank: Not evaluated ?Anterior apprehension: Not evaluated ?Posterior apprehension: Not evaluated ? ?She is neurovascularly intact to the right upper extremity. ? ?Shoulder X-Ray Imaging: ?Recent true AP, Y-scapular, and axillary views of the right shoulder are available for review and have been reviewed by myself. These films demonstrate significant degenerative changes with complete loss of the glenohumeral joint space. The films also exhibit a small inferior humeral osteophyte. The subacromial space is mildly decreased. There is no subacromial or infra-clavicular spurring. She demonstrates a Type II acromion. ? ?Assessment:  ? Rotator cuff tendinitis, right.  ? Primary osteoarthritis of right shoulder.  ? ?Plan:  ?The treatment options were discussed with the patient and her husband. In addition, patient educational materials were provided regarding the diagnosis and treatment options. The patient is quite frustrated by her symptoms and functional limitations, and is ready to consider more aggressive treatment options. Based on her plain radiographs, she clearly has significant degenerative changes. Therefore,  I have recommended a surgical procedure, specifically a reverse total shoulder arthroplasty. The risks (including bleeding, infection, nerve and/or blood vessel injury, persistent or recurrent pain, loosening or failure of the components, leg length inequality, dislocation, need for further surgery, blood clots, strokes,  heart attacks or arrhythmias, pneumonia, etc.) and benefits of the surgical procedure were discussed. The patient states her understanding and agrees to proceed. A formal written consent will be obtained

## 2021-10-12 NOTE — Evaluation (Signed)
Occupational Therapy Evaluation ?Patient Details ?Name: Monica Hull ?MRN: [id number] ?DOB: Mar 20, 1961 ?Today's Date: 10/12/2021 ? ? ?History of Present Illness 61yo female s/p reverse R TSA on 10/12/21.  ? ?Clinical Impression ?  ?Patient was seen for an OT evaluation this date. Pt lives with her spouse and son and will have 24/7 assist at home. Prior to surgery, pt was active and independent. Pt has orders for RUE to be immobilized and will be NWBing per MD. Patient presents with impaired strength/ROM, pain, and sensation to RUE. These impairments result in a decreased ability to perform self care tasks requiring mod assist for UB/LB dressing and bathing and max assist for application of polar care, compression stockings, and sling/immobilizer. Pt/family instructed in polar care mgt, compression stockings mgt, sling/immobilizer mgt, RUE precautions, adaptive strategies for bathing/dressing/toileting/grooming, positioning and considerations for sleep, pet care considerations, and home/routines modifications to maximize falls prevention, safety, and independence. Handout provided. Pt completes ADL transfers with CGA and handheld assist on L side due to slight balance impairments (still a bit groggy from surgery, RN confirms). OT adjusted sling/immobilizer and polar care to improve comfort, optimize positioning, and to maximize skin integrity/safety. Pt/family verbalized understanding of all education/training provided. Pt will benefit from additional skilled therapy services as arranged by the surgeon for rehab of her shoulder to maximize her return to PLOF with improved function and independence.      ? ?Recommendations for follow up therapy are one component of a multi-disciplinary discharge planning process, led by the attending physician.  Recommendations may be updated based on patient status, additional functional criteria and insurance authorization.  ? ?Follow Up Recommendations ? Follow physician's  recommendations for discharge plan and follow up therapies  ?  ?Assistance Recommended at Discharge Intermittent Supervision/Assistance  ?Patient can return home with the following A little help with walking and/or transfers;A lot of help with bathing/dressing/bathroom;Assistance with cooking/housework;Assist for transportation;Help with stairs or ramp for entrance ? ?  ?Functional Status Assessment ? Patient has had a recent decline in their functional status and demonstrates the ability to make significant improvements in function in a reasonable and predictable amount of time.  ?Equipment Recommendations ? None recommended by OT  ?  ?Recommendations for Other Services   ? ? ?  ?Precautions / Restrictions Precautions ?Precautions: Shoulder;Fall ?Shoulder Interventions: Shoulder sling/immobilizer;Shoulder abduction pillow;At all times;Off for dressing/bathing/exercises ?Precaution Booklet Issued: Yes (comment) ?Restrictions ?Weight Bearing Restrictions: Yes ?RUE Weight Bearing: Non weight bearing  ? ?  ? ?Mobility Bed Mobility ?  ?  ?  ?  ?  ?  ?  ?General bed mobility comments: NT, up in recliner ?  ? ?Transfers ?Overall transfer level: Needs assistance ?Equipment used: 1 person hand held assist ?Transfers: Sit to/from Stand ?Sit to Stand: Min guard ?  ?  ?  ?  ?  ?General transfer comment: slightly unsteady but with cues to slow down improves ?  ? ?  ?Balance Overall balance assessment: Needs assistance ?Sitting-balance support: No upper extremity supported, Feet supported ?Sitting balance-Leahy Scale: Fair ?  ?  ?Standing balance support: Single extremity supported, During functional activity ?Standing balance-Leahy Scale: Fair ?Standing balance comment: walks to the bathroom with handheld assist/CGA, mildly unsteady but no overt LOB ?  ?  ?  ?  ?  ?  ?  ?  ?  ?  ?  ?   ? ?ADL either performed or assessed with clinical judgement  ? ?ADL Overall ADL's : Needs assistance/impaired ?  ?  ?  ?  ?  ?  ?  ?  ?  ?  ?  ?   ?  ?  ?  ?  ?  ?  ?  ?  General ADL Comments: Pt currently requires MIN-MOD A for LB and UB ADL tasks, spouse able to assist as needed during session with PRN VC from OT for sequencing to maximize safety while RUE is out of the sling during UB dressing. CGA for ADL transfers, able to complete pericare in partial stand position using LUE with SBA  ? ? ? ?Vision   ?   ?   ?Perception   ?  ?Praxis   ?  ? ?Pertinent Vitals/Pain Pain Assessment ?Pain Assessment: No/denies pain  ? ? ? ?Hand Dominance   ?  ?Extremity/Trunk Assessment Upper Extremity Assessment ?Upper Extremity Assessment: RUE deficits/detail ?RUE Deficits / Details: s/p reverse TSA, beginning to get sensation back in hand and up forearm ?RUE: Unable to fully assess due to immobilization ?RUE Sensation: decreased light touch;decreased proprioception ?RUE Coordination: decreased fine motor;decreased gross motor ?  ?Lower Extremity Assessment ?Lower Extremity Assessment: Overall WFL for tasks assessed ?  ?Cervical / Trunk Assessment ?Cervical / Trunk Assessment: Normal ?  ?Communication   ?  ?Cognition Arousal/Alertness: Awake/alert, Suspect due to medications ?Behavior During Therapy: [facility] for tasks assessed/performed ?Overall Cognitive Status: Within Functional Limits for tasks assessed ?  ?  ?  ?  ?  ?  ?  ?  ?  ?  ?  ?  ?  ?  ?  ?  ?General Comments: still a little groggy ?  ?  ?General Comments    ? ?  ?Exercises Other Exercises ?Other Exercises: Pt/spouse/son instructed in RUE precautions, home/routines modifications, falls prevention, sling mgt, polar care mgt, strategies for bathing/dressing/grooming/toileting, pet care considerations, and compressio stocking mgt. Spouse assisted with polar care and shoulder sling mgt and son took pictures for visual reference once returned home. Handout provided to support recall and carryover. ?  ?Shoulder Instructions    ? ? ?Home Living Family/patient expects to be discharged to:: Private residence ?Living  Arrangements: Spouse/significant other;Children ?Available Help at Discharge: Family;Available PRN/intermittently ?Type of Home: House ?  ?  ?  ?  ?  ?  ?  ?  ?  ?  ?  ?  ?  ?  ?  ? ?  ?Prior Functioning/Environment Prior Level of Function : Independent/Modified Independent ?  ?  ?  ?  ?  ?  ?  ?  ?  ? ?  ?  ?OT Problem List: Decreased strength;Decreased coordination;Decreased range of motion;Decreased safety awareness;Impaired balance (sitting and/or standing);Decreased knowledge of use of DME or AE;Decreased knowledge of precautions;Impaired UE functional use ?  ?   ?OT Treatment/Interventions:    ?  ?OT Goals(Current goals can be found in the care plan section) Acute Rehab OT Goals ?Patient Stated Goal: go home ?OT Goal Formulation: All assessment and education complete, DC therapy  ?OT Frequency:   ?  ? ?Co-evaluation   ?  ?  ?  ?  ? ?  ?AM-PAC OT "6 Clicks" Daily Activity     ?Outcome Measure Help from another person eating meals?: A Little ?Help from another person taking care of personal grooming?: A Little ?Help from another person toileting, which includes using toliet, bedpan, or urinal?: A Little ?Help from another person bathing (including washing, rinsing, drying)?: A Lot ?Help from another person to put on and taking off regular upper body clothing?: A Lot ?Help from another person to put on and taking off regular lower body clothing?: A Lot ?6 Click Score: 15 ?  ?End of Session Nurse Communication: Mobility status ? ?  Activity Tolerance: Patient tolerated treatment well ?Patient left: in chair;with call bell/phone within reach;with family/visitor present ? ?OT Visit Diagnosis: Other abnormalities of gait and mobility (R26.89)  ?              ?Time: [id number] ?OT Time Calculation (min): 37 min ?Charges:  OT General Charges ?$OT Visit: 1 Visit ?OT Evaluation ?$OT Eval Moderate Complexity: 1 Mod ?OT Treatments ?$Self Care/Home Management : 23-37 mins ? ?Arman FilterJamie R., MPH, MS, OTR/L ?ascom  [phone] ?10/12/21, 4:11 PM ? ?

## 2021-10-12 NOTE — Op Note (Signed)
10/12/2021 ? ?1:17 PM ? ?Patient:   Monica Hull ? 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 ? ?Post-Op Diagnosis:   Same ? ?Procedure:   Reverse right total shoulder arthroplasty with biceps tenodesis. ? ?Surgeon:   Maryagnes Amos, MD ? ?Assistant:   Horris Latino, PA-C; Erick Colace, PA-S ? ?Anesthesia:   General endotracheal with an interscalene block using Exparel placed preoperatively by the anesthesiologist. ? ?Findings:   As above. ? ?Complications:   None ? ?EBL:   100 cc ? ?Fluids:   700 cc crystalloid ? ?UOP:   None ? ?TT:   None ? ?Drains:   None ? ?Closure:   Staples ? ?Implants:   All press-fit Biomet Comprehensive system with a #11 Identity micro-humeral stem, a -6 mm extended neutral humeral tray with a standard insert, and a mini-base plate with a 36 mm glenosphere. ? ?Brief Clinical Note:   The patient is a 61 year old female with a long history of progressively worsening right shoulder pain secondary to advanced degenerative joint disease of the right shoulder. The patient's symptoms have progressed despite medications, activity modification, etc. A preoperative MRI scan confirmed the presence of the advanced degenerative joint disease as well as identifying moderate rotator cuff tendinopathy and biceps tendinopathy. The patient presents at this time for a reverse right total shoulder arthroplasty. ? ?Procedure:   The patient underwent placement of an interscalene block using Exparel by the anesthesiologist in the preoperative holding area before being brought into the operating room and lain in the supine position. The patient then underwent general endotracheal intubation and anesthesia before the patient was repositioned in the beach chair position using the beach chair positioner. The right shoulder and upper extremity were prepped with ChloraPrep solution before being draped sterilely.  Preoperative antibiotics were administered. A timeout was performed to verify the appropriate surgical site.   ? ?A standard anterior approach to the shoulder was made through an approximately 4-5 inch incision. The incision was carried down through the subcutaneous tissues to expose the deltopectoral fascia. The interval between the deltoid and pectoralis muscles was identified and this plane developed, retracting the cephalic vein laterally with the deltoid muscle. The conjoined tendon was identified. Its lateral margin was dissected and the Kolbel self-retraining retractor inserted. The "three sisters" were identified and cauterized. Bursal tissues were removed to improve visualization.  ? ?The biceps tendon was identified near the inferior aspect of the bicipital groove. A soft tissue tenodesis was performed by attaching the biceps tendon to the adjacent pectoralis major tendon using two #0 Ethibond interrupted sutures. The biceps tendon was then transected just proximal to the tenodesis site. The subscapularis tendon was released from its attachment to the lesser tuberosity 1 cm proximal to its insertion and several tagging sutures placed. The inferior capsule was released with care after identifying and protecting the axillary nerve. The proximal humeral cut was made at approximately 25? of retroversion using the extra-medullary guide.  ? ?Attention was redirected to the glenoid. The labrum was debrided circumferentially before the center of the glenoid was marked with electrocautery. The guidewire was drilled into the glenoid neck using the appropriate guide. After verifying its position, it was overreamed with the mini-baseplate reamer to create a flat surface. The permanent mini-baseplate was impacted into place. It was stabilized with a 30 x 6.5 mm central screw and four peripheral screws. Locking screws were placed anteriorly, posteriorly, and inferiorly while a nonlocking  screw was placed superiorly. The  permanent 36 mm glenosphere was then impacted into place and its Morse taper locking mechanism verified using manual distraction. ? ?Attention was directed to the humeral side. The humeral canal was reamed sequentially beginning with the end-cutting reamer then progressing from a 4 mm reamer up to an 11 mm reamer. This provided excellent circumferential chatter. The canal was broached beginning with a #9 broach and progressing to a #11 broach. This was left in place and a trial reduction performed using the -6 mm extended offset trial humeral platform with the +0 mm insert. The arm demonstrated excellent range of motion as the hand could be brought across the chest to the opposite shoulder and brought to the top of the patient's head and to the patient's ear. The shoulder appeared stable throughout this range of motion. The joint was dislocated and the trial components removed. The permanent #11 micro-stem with the -6 mm extended offset humeral tray was put together on the back table before it was impacted into place with care taken to maintain the appropriate version. The standard insert was snapped into place and its locking mechanism was verified using manual distraction. The shoulder was relocated using two finger pressure and again placed through a range of motion with the findings as described above. ? ?The wound was copiously irrigated with sterile saline solution using the jet lavage system before a total of 30 cc of 0.5% Sensorcaine with epinephrine was injected into the pericapsular and peri-incisional tissues to help with postoperative analgesia. The subscapularis tendon was reapproximated using #2 FiberWire interrupted sutures. The deltopectoral interval was closed using #0 Vicryl interrupted sutures before the subcutaneous tissues were closed using 2-0 Vicryl interrupted sutures. The skin was closed using staples. Prior to closing the skin, 1 g of transexemic acid in 10 cc of normal saline was injected  intra-articularly to help with postoperative bleeding. A sterile occlusive dressing was applied to the wound before the arm was placed into a shoulder immobilizer with an abduction pillow. A Polar Care system also was applied to the shoulder. The patient was then transferred back to a hospital bed before being awakened, extubated, and returned to the recovery room in satisfactory condition after tolerating the procedure well. ?

## 2021-10-12 NOTE — Discharge Instructions (Addendum)
POLAR CARE INFORMATION ? ?MassAdvertisement.it ? ?How to use [facility] Therapy System?  YouTube   ShippingScam.co.uk ? ?OPERATING INSTRUCTIONS ? ?Start the product ?With dry hands, connect the transformer to the electrical connection located on the top of the cooler. Next, plug the transformer into an appropriate electrical outlet. The unit will automatically start running at this point. ? ?To stop the pump, disconnect electrical power.  ?Unplug to stop the product when not in use. Unplugging the Polar Care unit turns it off. Always unplug immediately after use. Never leave it plugged in while unattended. Remove pad.  ?  ?FIRST ADD WATER TO FILL LINE, THEN ICE---Replace ice when existing ice is almost melted ? ?1 Discuss Treatment with your Licensed Health Care Practitioner and Use Only as Prescribed ?2 Apply Insulation Barrier & Cold Therapy Pad ?3 Check for Moisture ?4 Inspect Skin Regularly ? ?Tips and Conservation officer, historic buildings Tips ?1. Use cubed or chunked ice for optimal performance. ?2. It is recommended to drain the Pad between uses. To drain the pad, hold the Pad upright with the hose pointed toward the ground. Depress the black plunger and allow water to drain out. ?3. You may disconnect the Pad from the unit without removing the pad from the affected area by depressing the silver tabs on the hose coupling and gently pulling the hoses apart. The Pad and unit will seal itself and will not leak. Note: Some dripping during release is normal. ?4. DO NOT RUN PUMP WITHOUT WATER! The pump in this unit is designed to run with water. Running the unit without water will cause permanent damage to the pump. ?5. Unplug unit before removing lid. ? ?TROUBLESHOOTING GUIDE ?Pump not running, Water not flowing to the pad, Pad is not getting cold ?1. Make sure the transformer is plugged into the wall outlet. ?2. Confirm that the ice and water are filled to the indicated levels. ?3. Make sure  there are no kinks in the pad. ?4. Gently pull on the blue tube to make sure the tube/pad junction is straight. ?5. Remove the pad from the treatment site and ll it while the pad is lying at; then reapply. ?6. Confirm that the pad couplings are securely attached to the unit. Listen for the double clicks (Figure 1) to confirm the pad couplings are securely attached. ? ?Leaks    Note: Some condensation on the lines, controller, and pads is unavoidable, especially in warmer climates. ?1. If using a [facility] Polar Care Cold Therapy unit with a detachable Cold Therapy Pad, and a leak exists (other than condensation on the lines) disconnect the pad couplings. Make sure the silver tabs on the couplings are depressed before reconnecting the pad to the pump hose; then confirm both sides of the coupling are properly clicked in. ?2. If the coupling continues to leak or a leak is detected in the pad itself, stop using it and call [employer] Customer Care at [phone]. ? ?Cleaning ?After use, empty and dry the unit with a soft cloth. Warm water and mild detergent may be used occasionally to clean the pump and tubes. ? ?WARNING: The Polar Care Cube can be cold enough to cause serious injury, including full skin necrosis. Follow these Operating Instructions, and carefully read the Product Insert (see pouch on side of unit) and the Cold Therapy Pad Fitting Instructions (provided with each Cold Therapy Pad) prior to use. ? ? ? ? ? ?  Interscalene Nerve Block (ISNB) Discharge Instructions  ? ?  For your surgery you have received an Interscalene Nerve Block. ?Nerve Blocks affect many types of nerves, including nerves that control movement, pain and normal sensation.  You may experience feelings such as numbness, tingling, heaviness, weakness or the inability to move your arm or the feeling or sensation that your arm has "fallen asleep". ?A nerve block can last for 2 - 36 hours or more depending on the medication used.  Usually the weakness  wears off first.  The tingling and heaviness usually wear off next.  Finally you may start to notice pain.  Keep in mind that this may occur in any order.  once a nerve block starts to wear off it is usually completely gone within 60 minutes. ?ISNB may cause mild shortness of breath, a hoarse voice, blurry vision, unequal pupils, or drooping of the face on the same side as the nerve block.  These symptoms will usually go away within 12 hours.  Very rarely the procedure itself can cause mild seizures. ?If needed, your surgeon will give you a prescription for pain medication.  It will take about 60 minutes for the oral pain medication to become fully effective.  So, it is recommended that you start taking this medication before the nerve block first begins to wear off, or when you first begin to feel discomfort. ?Keep in mind that nerve blocks often wear off in the middle of the night.   If you are going to bed and the block has not started to wear off or you have not started to have any discomfort, consider setting an alarm for 2 to 3 hours, so you can assess your block.  If you notice the block is wearing off or you are starting to have discomfort, you can take your pain medication. ?Take your pain medication only as prescribed.  Pain medication can cause sedation and decrease your breathing if you take more than you need for the level of pain that you have. ?Nausea is a common side effect of many pain medications.  You may want to eat something before taking your pain medicine to prevent nausea. ?After an Interscalene nerve block, you cannot feel pain, pressure or extremes in temperature in the effected arm.  Because your arm is numb it is at an increased risk for injury.  To decrease the possibility of injury, please practice the following: ? ?While you are awake change the position of your arm frequently to prevent too much pressure on any one area for prolonged periods of time. ? If you have a cast or tight  dressing, check the color or your fingers every couple of hours.  Call your surgeon with the appearance of any discoloration (white or blue). ?If you are given a sling to wear before you go home, please wear it  at all times until the block has completely worn off.  Do not get up at night without your sling. ?If you experience any problems or concerns, please contact your surgeon's office. ?If you experience severe or prolonged shortness of breath go to the nearest emergency department. SHOULDER SLING IMMOBILIZER  ? ?VIDEO ?Slingshot 2 Shoulder Brace Application - YouTube ---https://www.porter.info/ ? ?INSTRUCTIONS ?While supporting the injured arm, slide the forearm into the sling. Wrap the adjustable shoulder strap around the neck and shoulders and attach the strap end to the sling using  the ?alligator strap tab.?  ?Adjust the shoulder strap to the required length. ?Position the shoulder pad behind the neck. ?To secure the shoulder pad  location (optional), pull the shoulder strap away from the shoulder pad, unfold the hook material on the top of the pad, then press the shoulder strap back onto the hook material to secure the pad in place. ?Attach the closure strap across the open top of the sling. Position the strap so that it holds the arm securely in the sling. Next, attach the thumb strap to the open end of the sling between the thumb and fingers. ?After sling has been fit, it may be easily removed and reapplied using the quick release buckle on shoulder strap. ?If a neutral pillow or 15? abduction pillow is included, place the pillow at the waistline. Attach the sling to the pillow, lining up hook material on the pillow with the loop on sling. Adjust the waist strap to fit.  ?If waist strap is too long, cut it to fit. Use the small piece of double sided hook material (located on top of the pillow) to secure the strap end. Place the double sided hook material on the inside of the cut strap  end and secure it to the waist strap.     ?If no pillow is included, attach the waist strap to the sling and adjust to fit.   ? ?Washing Instructions: Straps and sling must be removed and cleaned regularly

## 2021-10-12 NOTE — Anesthesia Preprocedure Evaluation (Addendum)
Anesthesia Evaluation  ?Patient identified by MRN, date of birth, ID band ?Patient awake ? ? ? ?Reviewed: ?Allergy & Precautions, NPO status , Patient's Chart, lab work & pertinent test results ? ?History of Anesthesia Complications ?Negative for: history of anesthetic complications ? ?Airway ?Mallampati: III ? ?TM Distance: >3 FB ?Neck ROM: full ? ? ? Dental ? ?(+) Chipped, Poor Dentition, Missing ?  ?Pulmonary ?neg shortness of breath, asthma , sleep apnea , COPD, Current Smoker and Patient abstained from smoking.,  ?  ? ?+ decreased breath sounds ? ? ? ? ? Cardiovascular ?Exercise Tolerance: Good ?hypertension, (-) angina(-) Past MI Normal cardiovascular exam ? ? ?  ?Neuro/Psych ? Headaches, PSYCHIATRIC DISORDERS  Neuromuscular disease   ? GI/Hepatic ?Neg liver ROS, GERD  Controlled,  ?Endo/Other  ?diabetes ? Renal/GU ?Renal disease  ? ?  ?Musculoskeletal ? ?(+) Arthritis ,  ? Abdominal ?  ?Peds ? Hematology ?negative hematology ROS ?(+)   ?Anesthesia Other Findings ?Past Medical History: ?No date: Arthritis ?No date: Asthma ?No date: Carpal tunnel syndrome ?No date: Chronic kidney disease ?    Comment:  H/O KIDNEY STONES ?No date: COPD (chronic obstructive pulmonary disease) (HCC) ?No date: Diabetes mellitus without complication (HCC) ?    Comment:  DIET CONTROLLED ?No date: Elevated liver function tests ?No date: GERD (gastroesophageal reflux disease) ?No date: Headache ?    Comment:  MIGRAINES ?No date: History of kidney stones ?No date: Hyperlipidemia ?No date: Hypertension ?No date: Impaired fasting glucose ?No date: Pneumonia ?No date: Sleep apnea ?    Comment:  Not using CPAP- referred to sleep center ?No date: Tobacco abuse ? ?Past Surgical History: ?No date: CARPAL TUNNEL RELEASE ?No date: CESAREAN SECTION ?    Comment:  X2 ?07/11/2015: HYSTEROSCOPY WITH D & C; N/A ?    Comment:  Procedure: DILATATION AND CURETTAGE /HYSTEROSCOPY;   ?             Surgeon: Hildred Laser, MD;   Location: [facility] ORS;  Service: ?             Gynecology;  Laterality: N/A; ?No date: PALATE / UVULA BIOPSY / EXCISION ? ?BMI   ? Body Mass Index: 31.86 kg/m?  ?  ? ? Reproductive/Obstetrics ?negative OB ROS ? ?  ? ? ? ? ? ? ? ? ? ? ? ? ? ?  ?  ? ? ? ? ? ? ? ?Anesthesia Physical ?Anesthesia Plan ? ?ASA: 3 ? ?Anesthesia Plan: General ETT  ? ?Post-op Pain Management: Regional block*  ? ?Induction: Intravenous ? ?PONV Risk Score and Plan: Ondansetron, Dexamethasone, Midazolam and Treatment may vary due to age or medical condition ? ?Airway Management Planned: Oral ETT ? ?Additional Equipment:  ? ?Intra-op Plan:  ? ?Post-operative Plan: Extubation in OR ? ?Informed Consent: I have reviewed the patients History and Physical, chart, labs and discussed the procedure including the risks, benefits and alternatives for the proposed anesthesia with the patient or authorized representative who has indicated his/her understanding and acceptance.  ? ? ? ?Dental Advisory Given ? ?Plan Discussed with: Anesthesiologist, CRNA and Surgeon ? ?Anesthesia Plan Comments: (Patient has COPD, 97% on room air today.  Consented for risk of having to spend the night in observation if her saturation is too low after her interscalene nerve block.  Patient voiced assent and would like [place] to place the block. ? ? ?Patient consented for risks of anesthesia including but not limited to:  ?- adverse reactions to medications ?- damage  to eyes, teeth, lips or other oral mucosa ?- nerve damage due to positioning  ?- sore throat or hoarseness ?- Damage to heart, brain, nerves, lungs, other parts of body or loss of life ? ?Patient voiced understanding.)  ? ? ? ? ? ?Anesthesia Quick Evaluation ? ?

## 2021-10-12 NOTE — Anesthesia Postprocedure Evaluation (Signed)
Anesthesia Post Note ? ?Patient: Monica Hull ? ?Procedure(s) Performed: REVERSE TOTAL SHOULDER ARTHROPLASTY WITH BICEPS TENODESIS. (Right: Shoulder) ? ?Patient location during evaluation: PACU ?Anesthesia Type: General ?Level of consciousness: awake and alert ?Pain management: pain level controlled ?Vital Signs Assessment: post-procedure vital signs reviewed and stable ?Respiratory status: spontaneous breathing, nonlabored ventilation, respiratory function stable and patient connected to nasal cannula oxygen ?Cardiovascular status: blood pressure returned to baseline and stable ?Postop Assessment: no apparent nausea or vomiting ?Anesthetic complications: no ? ? ?No notable events documented. ? ? ?Last Vitals:  ?Vitals:  ? 10/12/21 1439 10/12/21 1447  ?BP:  111/65  ?Pulse: 76 66  ?Resp:  18  ?Temp:  36.6 ?C  ?SpO2: 93% 97%  ?  ?Last Pain:  ?Vitals:  ? 10/12/21 1447  ?TempSrc: Temporal  ?PainSc: 0-No pain  ? ? ?  ?  ?  ?  ?  ?  ? ?Cleda Mccreedy Lailany Enoch ? ? ? ? ?

## 2021-10-12 NOTE — Transfer of Care (Signed)
Immediate Anesthesia Transfer of Care Note ? ?Patient: Monica Hull ? ?Procedure(s) Performed: REVERSE TOTAL SHOULDER ARTHROPLASTY WITH BICEPS TENODESIS. (Right: Shoulder) ? ?Patient Location: PACU ? ?Anesthesia Type:General ? ?Level of Consciousness: drowsy ? ?Airway & Oxygen Therapy: Patient Spontanous Breathing and Patient connected to face mask oxygen ? ?Post-op Assessment: Report given to RN and Post -op Vital signs reviewed and stable ? ?Post vital signs: Reviewed and stable ? ?Last Vitals:  ?Vitals Value Taken Time  ?BP 115/70 10/12/21 1347  ?Temp    ?Pulse 79 10/12/21 1349  ?Resp 16 10/12/21 1349  ?SpO2 97 % 10/12/21 1349  ?Vitals shown include unvalidated device data. ? ?Last Pain:  ?Vitals:  ? 10/12/21 0815  ?TempSrc: Oral  ?PainSc: 0-No pain  ?   ? ?Patients Stated Pain Goal: 0 (10/12/21 0815) ? ?Complications: No notable events documented. ?

## 2021-10-12 NOTE — Anesthesia Procedure Notes (Signed)
Anesthesia Regional Block: Interscalene brachial plexus block  ? ?Pre-Anesthetic Checklist: , timeout performed,  Correct Patient, Correct Site, Correct Laterality,  Correct Procedure, Correct Position, site marked,  Risks and benefits discussed,  Surgical consent,  Pre-op evaluation,  At surgeon's request and post-op pain management ? ?Laterality: Upper and Right ? ?Prep: chloraprep     ?  ?Needles:  ?Injection technique: Single-shot ? ?Needle Type: Stimiplex   ? ? ?Needle Length: 9cm  ?Needle Gauge: 22  ? ? ? ?Additional Needles: ? ? ?Procedures:,,,, ultrasound used (permanent image in chart),,    ?Narrative:  ?Start time: 10/12/2021 8:57 AM ?End time: 10/12/2021 9:00 AM ?Injection made incrementally with aspirations every 5 mL. ? ?Performed by: Personally  ?Anesthesiologist: Varian Innes, Cleda Mccreedy, MD ? ?Additional Notes: ?Patient consented for risk and benefits of nerve block including but not limited to nerve damage, failed block, bleeding and infection.  Patient voiced understanding. ? ?Functioning IV was confirmed and monitors were applied.  Timeout done prior to procedure and prior to any sedation being given to the patient.  Patient confirmed procedure site prior to any sedation given to the patient.  A 44mm 22ga Stimuplex needle was used. Sterile prep,hand hygiene and sterile gloves were used.  Minimal sedation used for procedure.  No paresthesia endorsed by patient during the procedure.  Negative aspiration and negative test dose prior to incremental administration of local anesthetic. The patient tolerated the procedure well with no immediate complications. ? ? ? ?

## 2021-10-12 NOTE — Anesthesia Procedure Notes (Signed)
Procedure Name: Intubation ?Date/Time: 10/12/2021 11:06 AM ?Performed by: Aline Brochure, CRNA ?Pre-anesthesia Checklist: Patient identified, Patient being monitored, Timeout performed, Emergency Drugs available and Suction available ?Patient Re-evaluated:Patient Re-evaluated prior to induction ?Oxygen Delivery Method: Circle system utilized ?Preoxygenation: Pre-oxygenation with 100% oxygen ?Induction Type: IV induction ?Ventilation: Mask ventilation without difficulty ?Laryngoscope Size: 3 and McGraph ?Grade View: Grade I ?Tube type: Oral ?Tube size: 7.0 mm ?Number of attempts: 1 ?Airway Equipment and Method: Stylet and Video-laryngoscopy ?Placement Confirmation: ETT inserted through vocal cords under direct vision, positive ETCO2 and breath sounds checked- equal and bilateral ?Secured at: 21 cm ?Tube secured with: Tape ?Dental Injury: Teeth and Oropharynx as per pre-operative assessment  ? ? ? ? ?

## 2021-10-13 ENCOUNTER — Encounter: Payer: Self-pay | Admitting: Surgery

## 2021-10-16 LAB — SURGICAL PATHOLOGY

## 2021-10-17 DIAGNOSIS — G8929 Other chronic pain: Secondary | ICD-10-CM | POA: Diagnosis not present

## 2021-10-17 DIAGNOSIS — M6281 Muscle weakness (generalized): Secondary | ICD-10-CM | POA: Diagnosis not present

## 2021-10-17 DIAGNOSIS — M25611 Stiffness of right shoulder, not elsewhere classified: Secondary | ICD-10-CM | POA: Diagnosis not present

## 2021-10-17 DIAGNOSIS — M25511 Pain in right shoulder: Secondary | ICD-10-CM | POA: Diagnosis not present

## 2021-10-24 DIAGNOSIS — M25511 Pain in right shoulder: Secondary | ICD-10-CM | POA: Diagnosis not present

## 2021-10-24 DIAGNOSIS — G8929 Other chronic pain: Secondary | ICD-10-CM | POA: Diagnosis not present

## 2021-10-31 DIAGNOSIS — M25511 Pain in right shoulder: Secondary | ICD-10-CM | POA: Diagnosis not present

## 2021-11-02 ENCOUNTER — Ambulatory Visit: Payer: Self-pay

## 2021-11-02 NOTE — Telephone Encounter (Signed)
?  Chief Complaint: advice ?Symptoms: NA ?Frequency: NA ?Pertinent Negatives: NA ?Disposition: [] ED /[] Urgent Care (no appt availability in office) / [] Appointment(In office/virtual)/ []  [place] [facility]/ [x] Home Care/ [] Refused Recommended Disposition /[] [place] [facility]/ []  Follow-up with PCP ?Additional Notes: spoke with pt's husband. Advised him it would be ok for pt to have booster vaccine just to get administered in opposite shoulder from surgery arm.  ? ?Summary: COVID Booster  ? Patient had shoulder surgery 3 weeks ago and she is due for her COVID booster. Patient unsure if she should get her booster shot because of her recent surgery, please advise   ?  ? ?Reason for Disposition ? COVID-19 vaccine, Frequently Asked Questions (FAQs) ? ?Answer Assessment - Initial Assessment Questions ?1. MAIN CONCERN OR SYMPTOM:  "What is your main concern right now?" "What question do you have?" "What's the main symptom you're worried about?" (e.g., fever, pain, redness, swelling) ?    Can take booster after having surgery 3 weeks ago ? ?Protocols used: Coronavirus (COVID-19) Vaccine Questions and Reactions-A-AH ? ?

## 2021-11-06 ENCOUNTER — Ambulatory Visit (INDEPENDENT_AMBULATORY_CARE_PROVIDER_SITE_OTHER): Payer: BC Managed Care – PPO | Admitting: Family Medicine

## 2021-11-06 ENCOUNTER — Encounter: Payer: Self-pay | Admitting: Family Medicine

## 2021-11-06 VITALS — BP 114/69 | HR 68 | Temp 97.9°F | Wt 171.6 lb

## 2021-11-06 DIAGNOSIS — E1169 Type 2 diabetes mellitus with other specified complication: Secondary | ICD-10-CM

## 2021-11-06 DIAGNOSIS — E119 Type 2 diabetes mellitus without complications: Secondary | ICD-10-CM | POA: Diagnosis not present

## 2021-11-06 LAB — BAYER DCA HB A1C WAIVED: HB A1C (BAYER DCA - WAIVED): 6 % — ABNORMAL HIGH (ref 4.8–5.6)

## 2021-11-06 MED ORDER — METFORMIN HCL ER 500 MG PO TB24: 1000.0000 mg | ORAL_TABLET | Freq: Every day | ORAL | 1 refills | Status: DC

## 2021-11-06 NOTE — Progress Notes (Signed)
? ?BP 114/69   Pulse 68   Temp 97.9 ?F (36.6 ?C)   Wt 171 lb 9.6 oz (77.8 kg)   SpO2 96%   BMI 31.39 kg/m?   ? ?Subjective:  ? ? Patient ID: Monica Hull, female    DOB: Feb 09, 1961, 61 y.o.   MRN: [id number] ? ?HPI: ?Monica Hull is a 61 y.o. female ? ?Chief Complaint  ?Patient presents with  ? Diabetes  ? ?DIABETES ?Hypoglycemic episodes:no ?Polydipsia/polyuria: no ?Visual disturbance: no ?Chest pain: no ?Paresthesias: no ?Glucose Monitoring: no ? Accucheck frequency: Not Checking ?Taking Insulin?: no ?Blood Pressure Monitoring: not checking ?Retinal Examination: Not up to Date ?Foot Exam: Up to Date ?Diabetic Education: Completed ?Pneumovax: Up to Date ?Influenza: Up to Date ?Aspirin: yes ? ? ?Relevant past medical, surgical, family and social history reviewed and updated as indicated. Interim medical history since our last visit reviewed. ?Allergies and medications reviewed and updated. ? ?Review of Systems  ?Constitutional: Negative.   ?Respiratory: Negative.    ?Cardiovascular: Negative.   ?Gastrointestinal: Negative.   ?Musculoskeletal: Negative.   ?Psychiatric/Behavioral: Negative.    ? ?Per HPI unless specifically indicated above ? ?   ?Objective:  ?  ?BP 114/69   Pulse 68   Temp 97.9 ?F (36.6 ?C)   Wt 171 lb 9.6 oz (77.8 kg)   SpO2 96%   BMI 31.39 kg/m?   ?Wt Readings from Last 3 Encounters:  ?11/06/21 171 lb 9.6 oz (77.8 kg)  ?10/12/21 174 lb 3.3 oz (79 kg)  ?10/02/21 174 lb 8 oz (79.2 kg)  ?  ?Physical Exam ?Vitals and nursing note reviewed.  ?Constitutional:   ?   General: She is not in acute distress. ?   Appearance: Normal appearance. She is obese. She is not ill-appearing, toxic-appearing or diaphoretic.  ?   Comments: R arm in sling  ?HENT:  ?   Head: Normocephalic and atraumatic.  ?   Right Ear: External ear normal.  ?   Left Ear: External ear normal.  ?   Nose: Nose normal.  ?   Mouth/Throat:  ?   Mouth: Mucous membranes are moist.  ?   Pharynx: Oropharynx is clear.   ?Eyes:  ?   General: No scleral icterus.    ?   Right eye: No discharge.     ?   Left eye: No discharge.  ?   Extraocular Movements: Extraocular movements intact.  ?   Conjunctiva/sclera: Conjunctivae normal.  ?   Pupils: Pupils are equal, round, and reactive to light.  ?Cardiovascular:  ?   Rate and Rhythm: Normal rate and regular rhythm.  ?   Pulses: Normal pulses.  ?   Heart sounds: Normal heart sounds. No murmur heard. ?  No friction rub. No gallop.  ?Pulmonary:  ?   Effort: Pulmonary effort is normal. No respiratory distress.  ?   Breath sounds: Normal breath sounds. No stridor. No wheezing, rhonchi or rales.  ?Chest:  ?   Chest wall: No tenderness.  ?Musculoskeletal:     ?   General: Normal range of motion.  ?   Cervical back: Normal range of motion and neck supple.  ?Skin: ?   General: Skin is warm and dry.  ?   Capillary Refill: Capillary refill takes less than 2 seconds.  ?   Coloration: Skin is not jaundiced or pale.  ?   Findings: No bruising, erythema, lesion or rash.  ?Neurological:  ?   General: No focal deficit present.  ?  Mental Status: She is alert and oriented to person, place, and time. Mental status is at baseline.  ?Psychiatric:     ?   Mood and Affect: Mood normal.     ?   Behavior: Behavior normal.     ?   Thought Content: Thought content normal.     ?   Judgment: Judgment normal.  ? ? ?Results for orders placed or performed during the hospital encounter of 10/12/21  ?Glucose, capillary  ?Result Value Ref Range  ? Glucose-Capillary 145 (H) 70 - 99 mg/dL  ?Glucose, capillary  ?Result Value Ref Range  ? Glucose-Capillary 182 (H) 70 - 99 mg/dL  ?ABO/Rh  ?Result Value Ref Range  ? ABO/RH(D)    ?  B POS ?Performed at [facility], [address], [place], [place] [address] ?  ?Surgical pathology  ?Result Value Ref Range  ? SURGICAL PATHOLOGY    ?  SURGICAL PATHOLOGY ?CASE: [phone] ?PATIENT: Monica Hull ?Surgical Pathology Report ? ? ? ? ?Specimen Submitted: ?A. Humeral head,  right ? 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 Degenerative joint disease, right ?shoulder. ? ? ? ?DIAGNOSIS: ?A. HUMERAL HEAD, RIGHT; ARTHROPLASTY: ?- DEGENERATIVE JOINT DISEASE. ?- NEGATIVE FOR MALIGNANCY. ? ?GROSS DESCRIPTION: ?A. Labeled: Right humeral head ?Received: Fresh ?Collection time: 12:04 PM on 10/12/2021 ?Placed into formalin time: 1:29 PM on 10/12/2021 ?Size of specimen: ?     Head -4.5 x 4.5 x 1.8 cm ?     Neck -none grossly appreciated ?     Additional tissue: None grossly appreciated ?Articular surface: Tan-yellow and glistening with an area of eburnation ?Cut surface: Tan-yellow and firm ?Other findings: None noted ? ?Block summary: ?1 -area of eburnation to adjacent cartilage ? ?Tissue decalcification: Cassette 1 ? ?CM 10/12/2021 ? ?Final Diagnosis performed by  Allena Napoleon, MD.   Electronically signed ?10/16/2021 11:48:17AM ?The electronic signature indicates that the named Attending Pathologist ?has evaluated the specimen ?Technical component performed at [facility], [address], [place], ?[place] [address] Lab: [id number] Dir: Rush Farmer, MD, MMM ? Professional component performed at [facility], [facility], [place], [place], [place] [address] Lab: [phone] ?Dir: Kathi Simpers, MD ?  ? ?   ?Assessment & Plan:  ? ?Problem List Items Addressed This Visit   ? ?  ? Endocrine  ? Type 2 diabetes mellitus with other specified complication ([place])  ?  Doing much better with A1c of 6.0 down from 8.9. Continue current regimen. Continue to monitor. Call with any concerns. Refills given.  ? ?  ?  ? Relevant Medications  ? metFORMIN (GLUCOPHAGE-XR) 500 MG 24 hr tablet  ? ?Other Visit Diagnoses   ? ? Diet-controlled diabetes mellitus ([place])    -  Primary  ? Relevant Medications  ? metFORMIN (GLUCOPHAGE-XR) 500 MG 24 hr tablet  ? Other Relevant Orders  ? Bayer DCA Hb A1c Waived  ? ?  ?  ? ?Follow up plan: ?Return  in about 3 months (around 02/06/2022). ? ? ? ? ? ?

## 2021-11-06 NOTE — Assessment & Plan Note (Signed)
Doing much better with A1c of 6.0 down from 8.9. Continue current regimen. Continue to monitor. Call with any concerns. Refills given.  ?

## 2021-11-07 DIAGNOSIS — G8929 Other chronic pain: Secondary | ICD-10-CM | POA: Diagnosis not present

## 2021-11-07 DIAGNOSIS — M25511 Pain in right shoulder: Secondary | ICD-10-CM | POA: Diagnosis not present

## 2021-11-08 ENCOUNTER — Encounter: Payer: Self-pay | Admitting: Pulmonary Disease

## 2021-11-08 MED ORDER — BREZTRI AEROSPHERE 160-9-4.8 MCG/ACT IN AERO: 2.0000 | INHALATION_SPRAY | Freq: Two times a day (BID) | RESPIRATORY_TRACT | 3 refills | Status: DC

## 2021-11-13 ENCOUNTER — Ambulatory Visit: Payer: BC Managed Care – PPO | Admitting: Family Medicine

## 2021-11-16 DIAGNOSIS — M6281 Muscle weakness (generalized): Secondary | ICD-10-CM | POA: Diagnosis not present

## 2021-11-16 DIAGNOSIS — G8929 Other chronic pain: Secondary | ICD-10-CM | POA: Diagnosis not present

## 2021-11-16 DIAGNOSIS — M25611 Stiffness of right shoulder, not elsewhere classified: Secondary | ICD-10-CM | POA: Diagnosis not present

## 2021-11-16 DIAGNOSIS — M25511 Pain in right shoulder: Secondary | ICD-10-CM | POA: Diagnosis not present

## 2021-11-22 DIAGNOSIS — H5203 Hypermetropia, bilateral: Secondary | ICD-10-CM | POA: Diagnosis not present

## 2021-11-22 DIAGNOSIS — E119 Type 2 diabetes mellitus without complications: Secondary | ICD-10-CM | POA: Diagnosis not present

## 2021-11-22 DIAGNOSIS — M25511 Pain in right shoulder: Secondary | ICD-10-CM | POA: Diagnosis not present

## 2021-11-22 DIAGNOSIS — G8929 Other chronic pain: Secondary | ICD-10-CM | POA: Diagnosis not present

## 2021-11-22 LAB — HM DIABETES EYE EXAM

## 2021-11-27 DIAGNOSIS — G8929 Other chronic pain: Secondary | ICD-10-CM | POA: Diagnosis not present

## 2021-11-27 DIAGNOSIS — M19011 Primary osteoarthritis, right shoulder: Secondary | ICD-10-CM | POA: Diagnosis not present

## 2021-11-27 DIAGNOSIS — M25511 Pain in right shoulder: Secondary | ICD-10-CM | POA: Diagnosis not present

## 2021-11-27 DIAGNOSIS — Z96611 Presence of right artificial shoulder joint: Secondary | ICD-10-CM | POA: Diagnosis not present

## 2021-11-27 DIAGNOSIS — M7581 Other shoulder lesions, right shoulder: Secondary | ICD-10-CM | POA: Diagnosis not present

## 2021-12-04 DIAGNOSIS — M25511 Pain in right shoulder: Secondary | ICD-10-CM | POA: Diagnosis not present

## 2021-12-04 DIAGNOSIS — G8929 Other chronic pain: Secondary | ICD-10-CM | POA: Diagnosis not present

## 2021-12-07 DIAGNOSIS — G8929 Other chronic pain: Secondary | ICD-10-CM | POA: Diagnosis not present

## 2021-12-07 DIAGNOSIS — M25511 Pain in right shoulder: Secondary | ICD-10-CM | POA: Diagnosis not present

## 2021-12-21 ENCOUNTER — Ambulatory Visit: Admit: 2021-12-21 | Payer: BC Managed Care – PPO | Admitting: Gastroenterology

## 2021-12-21 SURGERY — COLONOSCOPY
Anesthesia: General

## 2021-12-28 ENCOUNTER — Ambulatory Visit: Payer: BC Managed Care – PPO | Admitting: Pulmonary Disease

## 2021-12-29 ENCOUNTER — Emergency Department: Payer: BC Managed Care – PPO

## 2021-12-29 ENCOUNTER — Encounter: Payer: Self-pay | Admitting: Intensive Care

## 2021-12-29 ENCOUNTER — Other Ambulatory Visit: Payer: Self-pay

## 2021-12-29 ENCOUNTER — Emergency Department
Admission: EM | Admit: 2021-12-29 | Discharge: 2021-12-29 | Disposition: A | Discharge status: Home / Self Care | Payer: BC Managed Care – PPO | Attending: Emergency Medicine | Admitting: Emergency Medicine

## 2021-12-29 DIAGNOSIS — N189 Chronic kidney disease, unspecified: Secondary | ICD-10-CM | POA: Insufficient documentation

## 2021-12-29 DIAGNOSIS — F17200 Nicotine dependence, unspecified, uncomplicated: Secondary | ICD-10-CM | POA: Insufficient documentation

## 2021-12-29 DIAGNOSIS — E1122 Type 2 diabetes mellitus with diabetic chronic kidney disease: Secondary | ICD-10-CM | POA: Diagnosis not present

## 2021-12-29 DIAGNOSIS — Y92000 Kitchen of unspecified non-institutional (private) residence as  the place of occurrence of the external cause: Secondary | ICD-10-CM | POA: Diagnosis not present

## 2021-12-29 DIAGNOSIS — J449 Chronic obstructive pulmonary disease, unspecified: Secondary | ICD-10-CM | POA: Insufficient documentation

## 2021-12-29 DIAGNOSIS — W07XXXA Fall from chair, initial encounter: Secondary | ICD-10-CM | POA: Insufficient documentation

## 2021-12-29 DIAGNOSIS — Z043 Encounter for examination and observation following other accident: Secondary | ICD-10-CM | POA: Diagnosis not present

## 2021-12-29 DIAGNOSIS — R55 Syncope and collapse: Secondary | ICD-10-CM

## 2021-12-29 DIAGNOSIS — I129 Hypertensive chronic kidney disease with stage 1 through stage 4 chronic kidney disease, or unspecified chronic kidney disease: Secondary | ICD-10-CM | POA: Insufficient documentation

## 2021-12-29 DIAGNOSIS — Z720 Tobacco use: Secondary | ICD-10-CM

## 2021-12-29 DIAGNOSIS — J45909 Unspecified asthma, uncomplicated: Secondary | ICD-10-CM | POA: Diagnosis not present

## 2021-12-29 DIAGNOSIS — S0990XA Unspecified injury of head, initial encounter: Secondary | ICD-10-CM | POA: Diagnosis not present

## 2021-12-29 DIAGNOSIS — Z96611 Presence of right artificial shoulder joint: Secondary | ICD-10-CM | POA: Insufficient documentation

## 2021-12-29 DIAGNOSIS — E041 Nontoxic single thyroid nodule: Secondary | ICD-10-CM

## 2021-12-29 DIAGNOSIS — R519 Headache, unspecified: Secondary | ICD-10-CM | POA: Diagnosis not present

## 2021-12-29 LAB — CBC
HCT: 49.7 % — ABNORMAL HIGH (ref 36.0–46.0)
Hemoglobin: 16.5 g/dL — ABNORMAL HIGH (ref 12.0–15.0)
MCH: 29.4 pg (ref 26.0–34.0)
MCHC: 33.2 g/dL (ref 30.0–36.0)
MCV: 88.4 fL (ref 80.0–100.0)
Platelets: 219 10*3/uL (ref 150–400)
RBC: 5.62 MIL/uL — ABNORMAL HIGH (ref 3.87–5.11)
RDW: 12.4 % (ref 11.5–15.5)
WBC: 9.5 10*3/uL (ref 4.0–10.5)
nRBC: 0 % (ref 0.0–0.2)

## 2021-12-29 LAB — URINALYSIS, COMPLETE (UACMP) WITH MICROSCOPIC
Bacteria, UA: NONE SEEN
Bilirubin Urine: NEGATIVE
Glucose, UA: NEGATIVE mg/dL
Hgb urine dipstick: NEGATIVE
Ketones, ur: NEGATIVE mg/dL
Leukocytes,Ua: NEGATIVE
Nitrite: NEGATIVE
Protein, ur: NEGATIVE mg/dL
Specific Gravity, Urine: 1.013 (ref 1.005–1.030)
pH: 5 (ref 5.0–8.0)

## 2021-12-29 LAB — HEPATIC FUNCTION PANEL
ALT: 23 U/L (ref 0–44)
AST: 28 U/L (ref 15–41)
Albumin: 3.8 g/dL (ref 3.5–5.0)
Alkaline Phosphatase: 116 U/L (ref 38–126)
Bilirubin, Direct: 0.2 mg/dL (ref 0.0–0.2)
Indirect Bilirubin: 0.7 mg/dL (ref 0.3–0.9)
Total Bilirubin: 0.9 mg/dL (ref 0.3–1.2)
Total Protein: 7.5 g/dL (ref 6.5–8.1)

## 2021-12-29 LAB — BASIC METABOLIC PANEL
Anion gap: 6 (ref 5–15)
BUN: 9 mg/dL (ref 8–23)
CO2: 24 mmol/L (ref 22–32)
Calcium: 9.1 mg/dL (ref 8.9–10.3)
Chloride: 108 mmol/L (ref 98–111)
Creatinine, Ser: 0.65 mg/dL (ref 0.44–1.00)
GFR, Estimated: >60 mL/min (ref >60)
Glucose, Bld: 131 mg/dL — ABNORMAL HIGH (ref 70–99)
Potassium: 3.7 mmol/L (ref 3.5–5.1)
Sodium: 138 mmol/L (ref 135–145)

## 2021-12-29 LAB — LIPASE, BLOOD: Lipase: 32 U/L (ref 11–51)

## 2021-12-29 LAB — TROPONIN I (HIGH SENSITIVITY): Troponin I (High Sensitivity): 3 ng/L (ref <18)

## 2021-12-29 MED ORDER — ACETAMINOPHEN 500 MG PO TABS
1000.0000 mg | ORAL_TABLET | Freq: Once | ORAL | Status: AC
  Administered 2021-12-29: 1000 mg via ORAL
  Filled 2021-12-29: qty 2

## 2021-12-29 MED ORDER — LACTATED RINGERS IV BOLUS
1000.0000 mL | Freq: Once | INTRAVENOUS | Status: AC
  Administered 2021-12-29: 1000 mL via INTRAVENOUS

## 2021-12-29 NOTE — ED Provider Notes (Signed)
[facility] Provider Note    Event Date/Time   First MD Initiated Contact with Patient 12/29/21 1518     (approximate)   History   Loss of Consciousness   HPI  Monica Hull is a 61 y.o. female with past medical history of arthritis, asthma, CKD, COPD, migraines, HTN, HDL, DM, OSA, tobacco abuse who presents for evaluation after apparent syncopal episode.  Patient reports feeling nauseous and awakened on the floor with headache.  She is here with her husband he states he heard a crash she was in the room and found her on the floor.  She think she lost consciousness.  She is not anticoagulated.  She states she has a slight headache and some soreness in her right shoulder.  No other acute pain.  Other than nausea she does not recall any preceding symptoms such as vomiting, chest pain, cough, shortness of breath, dizziness, preceding headache, earache, sore throat, abdominal pain, back pain, urinary symptoms, diarrhea rash or extremity pain weakness numbness or tingling.  She states the last time this happened was years ago.  She states she was sitting at the table playing video games on her phone.  She denies any recent EtOH use or any illicit drug use.      Past Medical History:  Diagnosis Date   Arthritis    Asthma    Carpal tunnel syndrome    Chronic kidney disease    H/O KIDNEY STONES   COPD (chronic obstructive pulmonary disease) (HCC)    Diabetes mellitus without complication (HCC)    DIET CONTROLLED   Elevated liver function tests    GERD (gastroesophageal reflux disease)    Headache    MIGRAINES   History of kidney stones    Hyperlipidemia    Hypertension    Impaired fasting glucose    Pneumonia    Sleep apnea    Not using CPAP- referred to sleep center   Tobacco abuse    Past Surgical History:  Procedure Laterality Date   CARPAL TUNNEL RELEASE     CESAREAN SECTION     X2   HYSTEROSCOPY WITH D & C N/A 07/11/2015   Procedure:  DILATATION AND CURETTAGE /HYSTEROSCOPY;  Surgeon: Hildred Laser, MD;  Location: [facility];  Service: Gynecology;  Laterality: N/A;   PALATE / UVULA BIOPSY / EXCISION     REVERSE SHOULDER ARTHROPLASTY Right 10/12/2021   Procedure: REVERSE TOTAL SHOULDER ARTHROPLASTY WITH BICEPS TENODESIS.;  Surgeon: Christena Flake, MD;  Location: [facility];  Service: Orthopedics;  Laterality: Right;     Physical Exam  Triage Vital Signs: ED Triage Vitals [12/29/21 1316]  Enc Vitals Group     BP (!) 150/96     Pulse Rate 72     Resp 16     Temp 97.7 F (36.5 C)     Temp Source Oral     SpO2 95 %     Weight 176 lb (79.8 kg)     Height 5\' 2"  (1.575 m)     Head Circumference      Peak Flow      Pain Score 7     Pain Loc      Pain Edu?      Excl. in GC?     Most recent vital signs: Vitals:   12/29/21 1316 12/29/21 1600  BP: (!) 150/96 (!) 145/74  Pulse: 72 75  Resp: 16 17  Temp: 97.7 F (36.5 C) 98.1 F (36.7 C)  SpO2: 95% 96%    General: Awake, no distress.  CV:  Good peripheral perfusion.  Difficult murmur.  2+ radial pulse. Resp:  Normal effort.  Clear bilaterally Abd:  No distention.  Soft throughout. Other:   CN II-XII grossly intact.  No pronator drift.  No finger dysmetria.  Some mild tenderness over the left anterior scalp.  No tenderness/deformities over the C/T/L spine.  Patient does have some mild soreness in the left trapezius area on rotating her neck other direction.  No focal TTP over b/l shoulders, elbows, wrists, hips, knees, ankles.  Patient does have some soreness on rotating and abducting the right shoulder.  2+ b/l radial and PD pulses   No other obvious trauma to face, scalp ,head, neck or torso No significant lower extremity edema.    ED Results / Procedures / Treatments  Labs (all labs ordered are listed, but only abnormal results are displayed) Labs Reviewed  CBC - Abnormal; Notable for the following components:      Result Value   RBC 5.62 (*)     Hemoglobin 16.5 (*)    HCT 49.7 (*)    All other components within normal limits  BASIC METABOLIC PANEL - Abnormal; Notable for the following components:   Glucose, Bld 131 (*)    All other components within normal limits  URINALYSIS, COMPLETE (UACMP) WITH MICROSCOPIC - Abnormal; Notable for the following components:   Color, Urine YELLOW (*)    APPearance CLEAR (*)    All other components within normal limits  HEPATIC FUNCTION PANEL  LIPASE, BLOOD  TROPONIN I (HIGH SENSITIVITY)     EKG  EKG is remarkable for sinus rhythm with a ventricular rate of 75, normal axis, nonspecific change versus artifact in inferior leads and V3 without other clear evidence of acute ischemia or significant arrhythmia.   RADIOLOGY  CT head on my interpretation without evidence of skull fracture, intracranial hemorrhage, edema, mass effect or other clear acute process.  I reviewed radiology interpretation and agree to findings of stable mild atrophy and chronic small vessel ischemic changes as well as right maxillary and bilateral sphenoid sinus mucosal thickening without air levels.  CT C-spine my interpretation without evidence of acute fracture or dislocation.  I also reviewed radiology's interpretation and agree their findings of some arthritis and thyroid nodule which I discussed with patient recommendation for PCP to be made aware by patient and to follow-up with ultrasound as indicated.  Chest reviewed by myself shows no focal consoidation, effusion, edema, pneumothorax or other clear acute thoracic process. I also reviewed radiology interpretation and agree with findings described.  I agree with findings consistent with patient's history of COPD.    PROCEDURES:  Critical Care performed: No  .1-3 Lead EKG Interpretation  Performed by: Gilles Chiquito, MD Authorized by: Gilles Chiquito, MD     Interpretation: normal     ECG rate assessment: normal     Rhythm: sinus rhythm     Ectopy: none      Conduction: normal    The patient is on the cardiac monitor to evaluate for evidence of arrhythmia and/or significant heart rate changes.   MEDICATIONS ORDERED IN ED: Medications  lactated ringers bolus 1,000 mL (1,000 mLs Intravenous New Bag/Given 12/29/21 1615)  acetaminophen (TYLENOL) tablet 1,000 mg (1,000 mg Oral Given 12/29/21 1615)     IMPRESSION / MDM / ASSESSMENT AND PLAN / ED COURSE  I reviewed the triage vital signs and the nursing notes. Patient's presentation  is most consistent with acute presentation with potential threat to life or bodily function.                               Differential diagnosis includes, but is not limited to syncope versus seizure with considerations for syncope including vasovagal, arrhythmia, anemia, dehydration, atypical ACS presentation, metabolic derangements, possible acute infectious process such as UTI or viral syndrome.  No focal deficits at this time to suggest a CVA.  No history of seizures or other clear findings on history or exam to suggest a seizure as patient did have a but sounds like almost immediate return to normal consciousness after her husband found her after resting in the room.  She was not postictal.  No historical or exam features to suggest a PE at this time.  EKG is remarkable for sinus rhythm with a ventricular rate of 75, normal axis, nonspecific change versus artifact in inferior leads and V3 without other clear evidence of acute ischemia or significant arrhythmia.  Chest pain and nonelevated troponin is not suggestive of ACS.  CT head on my interpretation without evidence of skull fracture, intracranial hemorrhage, edema, mass effect or other clear acute process.  I reviewed radiology interpretation and agree to findings of stable mild atrophy and chronic small vessel ischemic changes as well as right maxillary and bilateral sphenoid sinus mucosal thickening without air levels.  CT C-spine my interpretation without  evidence of acute fracture or dislocation.  I also reviewed radiology's interpretation and agree their findings of some arthritis and thyroid nodule which I discussed with patient recommendation for PCP to be made aware by patient and to follow-up with ultrasound as indicated.  Chest reviewed by myself shows no focal consoidation, effusion, edema, pneumothorax or other clear acute thoracic process. I also reviewed radiology interpretation and agree with findings described.  I agree with findings consistent with patient's history of COPD.  BMP shows no significant lecture light or metabolic derangements.  CBC without leukocytosis or acute anemia.  Hepatic function panel is unremarkable.  No evidence of acute hepatitis or cholestatic process.  Lipase not suggestive pancreatitis.  UA does not appear infected  Overall unclear precise etiology for patient's symptoms although given stable vitals with reassuring exam and work-up I have low suspicion for immediately life-threatening process at this time.  Discussed with her incidental finding of thyroid nodule and counseled on tobacco cessation.  Advised to follow-up with her PCP to determine if she needs further evaluation and have her blood pressure rechecked.  Discharged in stable condition.  Strict return precautions advised and discussed.     FINAL CLINICAL IMPRESSION(S) / ED DIAGNOSES   Final diagnoses:  Syncope, unspecified syncope type  Injury of head, initial encounter  Tobacco abuse  Thyroid nodule     Rx / DC Orders   ED Discharge Orders     None        Note:  This document was prepared using Dragon voice recognition software and may include unintentional dictation errors.   Gilles Chiquito, MD 12/29/21 [phone]

## 2021-12-29 NOTE — ED Notes (Signed)
Follow up pcp info provided all questions answered  

## 2021-12-29 NOTE — ED Triage Notes (Signed)
Patient reports while sitting at kitchen table she started to feel sick and then had episode of LOC in kitchen floor from chair. Husband reports he heard the fall and she woke up as soon as she hit the ground. Patient c/o left head pain.

## 2022-01-05 ENCOUNTER — Encounter: Payer: Self-pay | Admitting: Nurse Practitioner

## 2022-01-05 ENCOUNTER — Ambulatory Visit (INDEPENDENT_AMBULATORY_CARE_PROVIDER_SITE_OTHER): Payer: BC Managed Care – PPO | Admitting: Nurse Practitioner

## 2022-01-05 VITALS — BP 130/87 | HR 73 | Temp 98.2°F | Wt 165.6 lb

## 2022-01-05 DIAGNOSIS — J441 Chronic obstructive pulmonary disease with (acute) exacerbation: Secondary | ICD-10-CM | POA: Diagnosis not present

## 2022-01-05 MED ORDER — PREDNISONE 10 MG PO TABS: 10.0000 mg | ORAL_TABLET | Freq: Every day | ORAL | 0 refills | Status: DC

## 2022-01-05 MED ORDER — BENZONATATE 200 MG PO CAPS: 200.0000 mg | ORAL_CAPSULE | Freq: Two times a day (BID) | ORAL | 0 refills | Status: DC | PRN

## 2022-01-05 MED ORDER — TRIAMCINOLONE ACETONIDE 40 MG/ML IJ SUSP
40.0000 mg | Freq: Once | INTRAMUSCULAR | Status: AC
  Administered 2022-01-05: 40 mg via INTRAMUSCULAR

## 2022-01-05 NOTE — Progress Notes (Signed)
BP 130/87   Pulse 73   Temp 98.2 F (36.8 C) (Oral)   Wt 165 lb 9.6 oz (75.1 kg)   SpO2 97%   BMI 30.29 kg/m    Subjective:    Patient ID: Monica Hull, female    DOB: 08/31/1960, 61 y.o.   MRN: [id number]  HPI: Monica Hull is a 61 y.o. female  Chief Complaint  Patient presents with   Cough    Ongoing x 1 week.    UPPER RESPIRATORY TRACT INFECTION Worst symptom: Symptoms have been going on for about a week Fever: no Cough: yes Shortness of breath:  same as her usually SOB Wheezing:  yes Chest pain: no Chest tightness: no Chest congestion: yes Nasal congestion: yes Runny nose: no Post nasal drip: yes Sneezing: no Sore throat: no Swollen glands: no Sinus pressure: no Headache: yes Face pain: no Toothache: no Ear pain: no bilateral Ear pressure: no bilateral Eyes red/itching:no Eye drainage/crusting: no  Vomiting: no Rash: no Fatigue: yes Sick contacts: no Strep contacts: no  Context: worse Recurrent sinusitis: no Relief with OTC cold/cough medications: no  Treatments attempted: none   Relevant past medical, surgical, family and social history reviewed and updated as indicated. Interim medical history since our last visit reviewed. Allergies and medications reviewed and updated.  Review of Systems  Constitutional:  Positive for fatigue. Negative for fever.  HENT:  Positive for congestion. Negative for dental problem, ear pain, postnasal drip, rhinorrhea, sinus pressure, sinus pain, sneezing and sore throat.   Respiratory:  Positive for cough, shortness of breath and wheezing.   Cardiovascular:  Negative for chest pain.  Gastrointestinal:  Negative for vomiting.  Skin:  Negative for rash.  Neurological:  Positive for headaches.    Per HPI unless specifically indicated above     Objective:    BP 130/87   Pulse 73   Temp 98.2 F (36.8 C) (Oral)   Wt 165 lb 9.6 oz (75.1 kg)   SpO2 97%   BMI 30.29 kg/m   Wt Readings from  Last 3 Encounters:  01/05/22 165 lb 9.6 oz (75.1 kg)  12/29/21 176 lb (79.8 kg)  11/06/21 171 lb 9.6 oz (77.8 kg)    Physical Exam Vitals and nursing note reviewed.  Constitutional:      General: She is not in acute distress.    Appearance: Normal appearance. She is normal weight. She is not ill-appearing, toxic-appearing or diaphoretic.  HENT:     Head: Normocephalic.     Right Ear: Tympanic membrane and external ear normal.     Left Ear: Tympanic membrane and external ear normal.     Nose: Congestion present. No rhinorrhea.     Mouth/Throat:     Mouth: Mucous membranes are moist.     Pharynx: Oropharynx is clear. No oropharyngeal exudate or posterior oropharyngeal erythema.  Eyes:     General:        Right eye: No discharge.        Left eye: No discharge.     Extraocular Movements: Extraocular movements intact.     Conjunctiva/sclera: Conjunctivae normal.     Pupils: Pupils are equal, round, and reactive to light.  Cardiovascular:     Rate and Rhythm: Normal rate and regular rhythm.     Heart sounds: No murmur heard. Pulmonary:     Effort: Pulmonary effort is normal. No respiratory distress.     Breath sounds: Normal breath sounds. No wheezing or rales.  Musculoskeletal:  Cervical back: Normal range of motion and neck supple.  Skin:    General: Skin is warm and dry.     Capillary Refill: Capillary refill takes less than 2 seconds.  Neurological:     General: No focal deficit present.     Mental Status: She is alert and oriented to person, place, and time. Mental status is at baseline.  Psychiatric:        Mood and Affect: Mood normal.        Behavior: Behavior normal.        Thought Content: Thought content normal.        Judgment: Judgment normal.     Results for orders placed or performed during the hospital encounter of 12/29/21  CBC  Result Value Ref Range   WBC 9.5 4.0 - 10.5 K/uL   RBC 5.62 (H) 3.87 - 5.11 MIL/uL   Hemoglobin 16.5 (H) 12.0 - 15.0 g/dL    HCT 78.2 (H) 95.6 - 46.0 %   MCV 88.4 80.0 - 100.0 fL   MCH 29.4 26.0 - 34.0 pg   MCHC 33.2 30.0 - 36.0 g/dL   RDW 21.3 08.6 - 57.8 %   Platelets 219 150 - 400 K/uL   nRBC 0.0 0.0 - 0.2 %  Basic metabolic panel  Result Value Ref Range   Sodium 138 135 - 145 mmol/L   Potassium 3.7 3.5 - 5.1 mmol/L   Chloride 108 98 - 111 mmol/L   CO2 24 22 - 32 mmol/L   Glucose, Bld 131 (H) 70 - 99 mg/dL   BUN 9 8 - 23 mg/dL   Creatinine, Ser 4.69 0.44 - 1.00 mg/dL   Calcium 9.1 8.9 - 62.9 mg/dL   GFR, Estimated >52 >84 mL/min   Anion gap 6 5 - 15  Urinalysis, Complete w Microscopic  Result Value Ref Range   Color, Urine YELLOW (A) YELLOW   APPearance CLEAR (A) CLEAR   Specific Gravity, Urine 1.013 1.005 - 1.030   pH 5.0 5.0 - 8.0   Glucose, UA NEGATIVE NEGATIVE mg/dL   Hgb urine dipstick NEGATIVE NEGATIVE   Bilirubin Urine NEGATIVE NEGATIVE   Ketones, ur NEGATIVE NEGATIVE mg/dL   Protein, ur NEGATIVE NEGATIVE mg/dL   Nitrite NEGATIVE NEGATIVE   Leukocytes,Ua NEGATIVE NEGATIVE   RBC / HPF 0-5 0 - 5 RBC/hpf   WBC, UA 0-5 0 - 5 WBC/hpf   Bacteria, UA NONE SEEN NONE SEEN   Squamous Epithelial / LPF 0-5 0 - 5   Mucus PRESENT   Hepatic function panel  Result Value Ref Range   Total Protein 7.5 6.5 - 8.1 g/dL   Albumin 3.8 3.5 - 5.0 g/dL   AST 28 15 - 41 U/L   ALT 23 0 - 44 U/L   Alkaline Phosphatase 116 38 - 126 U/L   Total Bilirubin 0.9 0.3 - 1.2 mg/dL   Bilirubin, Direct 0.2 0.0 - 0.2 mg/dL   Indirect Bilirubin 0.7 0.3 - 0.9 mg/dL  Lipase, blood  Result Value Ref Range   Lipase 32 11 - 51 U/L  Troponin I (High Sensitivity)  Result Value Ref Range   Troponin I (High Sensitivity) 3 <18 ng/L      Assessment & Plan:   Problem List Items Addressed This Visit   None Visit Diagnoses     COPD exacerbation (HCC)    -  Primary   Kenalog given in office. Start Prednisone tomorrow. Chest xray ordered to rule out pneumonia. Refilled Tessalon. Will  make recommendations based on imaging.    Relevant Medications   predniSONE (DELTASONE) 10 MG tablet   benzonatate (TESSALON) 200 MG capsule   triamcinolone acetonide (KENALOG-40) injection 40 mg   Other Relevant Orders   DG Chest 2 View        Follow up plan: Return if symptoms worsen or fail to improve.

## 2022-01-06 ENCOUNTER — Ambulatory Visit
Admission: RE | Admit: 2022-01-06 | Discharge: 2022-01-06 | Disposition: A | Discharge status: Home / Self Care | Payer: BC Managed Care – PPO | Source: Ambulatory Visit | Attending: Nurse Practitioner | Admitting: Nurse Practitioner

## 2022-01-06 ENCOUNTER — Ambulatory Visit
Admission: RE | Admit: 2022-01-06 | Discharge: 2022-01-06 | Disposition: A | Discharge status: Home / Self Care | Payer: BC Managed Care – PPO | Attending: Nurse Practitioner | Admitting: Nurse Practitioner

## 2022-01-06 DIAGNOSIS — J441 Chronic obstructive pulmonary disease with (acute) exacerbation: Secondary | ICD-10-CM | POA: Diagnosis not present

## 2022-01-06 DIAGNOSIS — R059 Cough, unspecified: Secondary | ICD-10-CM | POA: Diagnosis not present

## 2022-01-08 NOTE — Progress Notes (Signed)
Please let patient know that her chest xray shows some bronchitis.  I recommend completing the course of prednisone. If symptoms are not improved by the end of the week please let us know.

## 2022-01-11 ENCOUNTER — Telehealth: Payer: Self-pay

## 2022-01-11 NOTE — Telephone Encounter (Signed)
PA initiated via CoverMyMeds for Trulicity 1.5mg /0.5ML KEY: [id number]  Medication has been approved through 12/12/21- 01/11/2023

## 2022-01-12 DIAGNOSIS — Z96611 Presence of right artificial shoulder joint: Secondary | ICD-10-CM | POA: Diagnosis not present

## 2022-01-12 DIAGNOSIS — M19011 Primary osteoarthritis, right shoulder: Secondary | ICD-10-CM | POA: Diagnosis not present

## 2022-02-06 ENCOUNTER — Encounter: Payer: Self-pay | Admitting: Family Medicine

## 2022-02-06 ENCOUNTER — Ambulatory Visit: Payer: BC Managed Care – PPO | Admitting: Family Medicine

## 2022-02-06 VITALS — BP 118/79 | HR 67 | Temp 97.9°F | Wt 160.5 lb

## 2022-02-06 DIAGNOSIS — I10 Essential (primary) hypertension: Secondary | ICD-10-CM | POA: Diagnosis not present

## 2022-02-06 DIAGNOSIS — K219 Gastro-esophageal reflux disease without esophagitis: Secondary | ICD-10-CM

## 2022-02-06 DIAGNOSIS — E1169 Type 2 diabetes mellitus with other specified complication: Secondary | ICD-10-CM | POA: Diagnosis not present

## 2022-02-06 DIAGNOSIS — J418 Mixed simple and mucopurulent chronic bronchitis: Secondary | ICD-10-CM | POA: Diagnosis not present

## 2022-02-06 DIAGNOSIS — E782 Mixed hyperlipidemia: Secondary | ICD-10-CM

## 2022-02-06 DIAGNOSIS — I700 Atherosclerosis of aorta: Secondary | ICD-10-CM

## 2022-02-06 LAB — BAYER DCA HB A1C WAIVED: HB A1C (BAYER DCA - WAIVED): 5.9 % — ABNORMAL HIGH (ref 4.8–5.6)

## 2022-02-06 MED ORDER — PANTOPRAZOLE SODIUM 40 MG PO TBEC: 40.0000 mg | DELAYED_RELEASE_TABLET | Freq: Every morning | ORAL | 1 refills | Status: DC

## 2022-02-06 MED ORDER — METFORMIN HCL ER 500 MG PO TB24: 1000.0000 mg | ORAL_TABLET | Freq: Every day | ORAL | 1 refills | Status: DC

## 2022-02-06 MED ORDER — TRULICITY 1.5 MG/0.5ML ~~LOC~~ SOAJ: 1.5000 mg | SUBCUTANEOUS | 1 refills | Status: DC

## 2022-02-06 MED ORDER — LISINOPRIL 40 MG PO TABS: 40.0000 mg | ORAL_TABLET | Freq: Every day | ORAL | 1 refills | Status: DC

## 2022-02-06 MED ORDER — ATORVASTATIN CALCIUM 80 MG PO TABS: 80.0000 mg | ORAL_TABLET | Freq: Every day | ORAL | 1 refills | Status: DC

## 2022-02-06 NOTE — Assessment & Plan Note (Signed)
Under good control on current regimen. Continue current regimen. Continue to monitor. Call with any concerns. Refills given. Labs drawn today.   

## 2022-02-06 NOTE — Assessment & Plan Note (Signed)
Doing great with a1c of 5.9. Continue current regimen. Continue to monitor. Call with any concerns.

## 2022-02-06 NOTE — Assessment & Plan Note (Signed)
Will keep BP and cholesterol under good control. Continue to monitor. Call with any concerns.  

## 2022-02-06 NOTE — Progress Notes (Signed)
BP 118/79   Pulse 67   Temp 97.9 F (36.6 C)   Wt 160 lb 8 oz (72.8 kg)   SpO2 96%   BMI 29.36 kg/m    Subjective:    Patient ID: Monica Hull, female    DOB: 01/11/1961, 61 y.o.   MRN: [id number]  HPI: Monica Hull is a 61 y.o. female  Chief Complaint  Patient presents with   Diabetes   Hypertension   Hyperlipidemia   DIABETES Hypoglycemic episodes:no Polydipsia/polyuria: no Visual disturbance: no Chest pain: no Paresthesias: no Glucose Monitoring: no  Accucheck frequency: Not Checking Taking Insulin?: no Blood Pressure Monitoring: not checking Retinal Examination: Up to Date Foot Exam: Not up to Date Diabetic Education: Completed Pneumovax: Up to Date Influenza: Up to Date Aspirin: yes  HYPERTENSION / HYPERLIPIDEMIA Satisfied with current treatment? yes Duration of hypertension: chronic BP monitoring frequency: not checking BP medication side effects: no Past BP meds: lisinopril Duration of hyperlipidemia: chronic Cholesterol medication side effects: no Cholesterol supplements: none Past cholesterol medications: atorvastatin Medication compliance: excellent compliance Aspirin: yes Recent stressors: no Recurrent headaches: no Visual changes: no Palpitations: no Dyspnea: no Chest pain: no Lower extremity edema: no Dizzy/lightheaded: no  Relevant past medical, surgical, family and social history reviewed and updated as indicated. Interim medical history since our last visit reviewed. Allergies and medications reviewed and updated.  Review of Systems  Constitutional: Negative.   Respiratory: Negative.    Cardiovascular: Negative.   Gastrointestinal: Negative.   Musculoskeletal: Negative.   Neurological: Negative.   Psychiatric/Behavioral: Negative.      Per HPI unless specifically indicated above     Objective:    BP 118/79   Pulse 67   Temp 97.9 F (36.6 C)   Wt 160 lb 8 oz (72.8 kg)   SpO2 96%   BMI 29.36  kg/m   Wt Readings from Last 3 Encounters:  02/06/22 160 lb 8 oz (72.8 kg)  01/05/22 165 lb 9.6 oz (75.1 kg)  12/29/21 176 lb (79.8 kg)    Physical Exam Vitals and nursing note reviewed.  Constitutional:      General: She is not in acute distress.    Appearance: Normal appearance. She is obese. She is not ill-appearing, toxic-appearing or diaphoretic.  HENT:     Head: Normocephalic and atraumatic.     Right Ear: External ear normal.     Left Ear: External ear normal.     Nose: Nose normal.     Mouth/Throat:     Mouth: Mucous membranes are moist.     Pharynx: Oropharynx is clear.  Eyes:     General: No scleral icterus.       Right eye: No discharge.        Left eye: No discharge.     Extraocular Movements: Extraocular movements intact.     Conjunctiva/sclera: Conjunctivae normal.     Pupils: Pupils are equal, round, and reactive to light.  Cardiovascular:     Rate and Rhythm: Normal rate and regular rhythm.     Pulses: Normal pulses.     Heart sounds: Normal heart sounds. No murmur heard.    No friction rub. No gallop.  Pulmonary:     Effort: Pulmonary effort is normal. No respiratory distress.     Breath sounds: Normal breath sounds. No stridor. No wheezing, rhonchi or rales.  Chest:     Chest wall: No tenderness.  Musculoskeletal:        General: Normal range of  motion.     Cervical back: Normal range of motion and neck supple.  Skin:    General: Skin is warm and dry.     Capillary Refill: Capillary refill takes less than 2 seconds.     Coloration: Skin is not jaundiced or pale.     Findings: No bruising, erythema, lesion or rash.  Neurological:     General: No focal deficit present.     Mental Status: She is alert and oriented to person, place, and time. Mental status is at baseline.  Psychiatric:        Mood and Affect: Mood normal.        Behavior: Behavior normal.        Thought Content: Thought content normal.        Judgment: Judgment normal.     Results  for orders placed or performed during the hospital encounter of 12/29/21  CBC  Result Value Ref Range   WBC 9.5 4.0 - 10.5 K/uL   RBC 5.62 (H) 3.87 - 5.11 MIL/uL   Hemoglobin 16.5 (H) 12.0 - 15.0 g/dL   HCT 08.6 (H) 57.8 - 46.9 %   MCV 88.4 80.0 - 100.0 fL   MCH 29.4 26.0 - 34.0 pg   MCHC 33.2 30.0 - 36.0 g/dL   RDW 62.9 52.8 - 41.3 %   Platelets 219 150 - 400 K/uL   nRBC 0.0 0.0 - 0.2 %  Basic metabolic panel  Result Value Ref Range   Sodium 138 135 - 145 mmol/L   Potassium 3.7 3.5 - 5.1 mmol/L   Chloride 108 98 - 111 mmol/L   CO2 24 22 - 32 mmol/L   Glucose, Bld 131 (H) 70 - 99 mg/dL   BUN 9 8 - 23 mg/dL   Creatinine, Ser 2.44 0.44 - 1.00 mg/dL   Calcium 9.1 8.9 - 01.0 mg/dL   GFR, Estimated >27 >25 mL/min   Anion gap 6 5 - 15  Urinalysis, Complete w Microscopic  Result Value Ref Range   Color, Urine YELLOW (A) YELLOW   APPearance CLEAR (A) CLEAR   Specific Gravity, Urine 1.013 1.005 - 1.030   pH 5.0 5.0 - 8.0   Glucose, UA NEGATIVE NEGATIVE mg/dL   Hgb urine dipstick NEGATIVE NEGATIVE   Bilirubin Urine NEGATIVE NEGATIVE   Ketones, ur NEGATIVE NEGATIVE mg/dL   Protein, ur NEGATIVE NEGATIVE mg/dL   Nitrite NEGATIVE NEGATIVE   Leukocytes,Ua NEGATIVE NEGATIVE   RBC / HPF 0-5 0 - 5 RBC/hpf   WBC, UA 0-5 0 - 5 WBC/hpf   Bacteria, UA NONE SEEN NONE SEEN   Squamous Epithelial / LPF 0-5 0 - 5   Mucus PRESENT   Hepatic function panel  Result Value Ref Range   Total Protein 7.5 6.5 - 8.1 g/dL   Albumin 3.8 3.5 - 5.0 g/dL   AST 28 15 - 41 U/L   ALT 23 0 - 44 U/L   Alkaline Phosphatase 116 38 - 126 U/L   Total Bilirubin 0.9 0.3 - 1.2 mg/dL   Bilirubin, Direct 0.2 0.0 - 0.2 mg/dL   Indirect Bilirubin 0.7 0.3 - 0.9 mg/dL  Lipase, blood  Result Value Ref Range   Lipase 32 11 - 51 U/L  Troponin I (High Sensitivity)  Result Value Ref Range   Troponin I (High Sensitivity) 3 <18 ng/L      Assessment & Plan:   Problem List Items Addressed This Visit        Cardiovascular and Mediastinum  Hypertension    Under good control on current regimen. Continue current regimen. Continue to monitor. Call with any concerns. Refills given. Labs drawn today.        Relevant Medications   atorvastatin (LIPITOR) 80 MG tablet   lisinopril (ZESTRIL) 40 MG tablet   Other Relevant Orders   Comprehensive metabolic panel   CBC with Differential/Platelet   TSH   Aortic atherosclerosis (HCC)    Will keep BP and cholesterol under good control. Continue to monitor. Call with any concerns.       Relevant Medications   atorvastatin (LIPITOR) 80 MG tablet   lisinopril (ZESTRIL) 40 MG tablet   Other Relevant Orders   Comprehensive metabolic panel   CBC with Differential/Platelet     Respiratory   COPD (chronic obstructive pulmonary disease) (HCC)    Under good control on current regimen. Continue current regimen. Continue to monitor. Call with any concerns. Refills given. Labs drawn today.       Relevant Orders   Comprehensive metabolic panel   CBC with Differential/Platelet     Digestive   GERD (gastroesophageal reflux disease)    Under good control on current regimen. Continue current regimen. Continue to monitor. Call with any concerns. Refills given. Labs drawn today.       Relevant Medications   pantoprazole (PROTONIX) 40 MG tablet     Endocrine   Type 2 diabetes mellitus with other specified complication (HCC) - Primary    Doing great with a1c of 5.9. Continue current regimen. Continue to monitor. Call with any concerns.       Relevant Medications   atorvastatin (LIPITOR) 80 MG tablet   lisinopril (ZESTRIL) 40 MG tablet   Dulaglutide (TRULICITY) 1.5 MG/0.5ML SOPN (Start on 02/07/2022)   metFORMIN (GLUCOPHAGE-XR) 500 MG 24 hr tablet   Other Relevant Orders   Comprehensive metabolic panel   CBC with Differential/Platelet   Bayer DCA Hb A1c Waived     Other   Hyperlipidemia    Under good control on current regimen. Continue current  regimen. Continue to monitor. Call with any concerns. Refills given. Labs drawn today.       Relevant Medications   atorvastatin (LIPITOR) 80 MG tablet   lisinopril (ZESTRIL) 40 MG tablet   Other Relevant Orders   Comprehensive metabolic panel   CBC with Differential/Platelet   Lipid Panel w/o Chol/HDL Ratio     Follow up plan: Return in about 6 months (around 08/09/2022) for physical .

## 2022-02-07 LAB — LIPID PANEL W/O CHOL/HDL RATIO
Cholesterol, Total: 206 mg/dL — ABNORMAL HIGH (ref 100–199)
HDL: 51 mg/dL (ref >39)
LDL Chol Calc (NIH): 138 mg/dL — ABNORMAL HIGH (ref 0–99)
Triglycerides: 93 mg/dL (ref 0–149)
VLDL Cholesterol Cal: 17 mg/dL (ref 5–40)

## 2022-02-07 LAB — COMPREHENSIVE METABOLIC PANEL
ALT: 21 IU/L (ref 0–32)
AST: 23 IU/L (ref 0–40)
Albumin/Globulin Ratio: 1.4 (ref 1.2–2.2)
Albumin: 4.2 g/dL (ref 3.9–4.9)
Alkaline Phosphatase: 130 IU/L — ABNORMAL HIGH (ref 44–121)
BUN/Creatinine Ratio: 10 — ABNORMAL LOW (ref 12–28)
BUN: 7 mg/dL — ABNORMAL LOW (ref 8–27)
Bilirubin Total: 0.6 mg/dL (ref 0.0–1.2)
CO2: 22 mmol/L (ref 20–29)
Calcium: 9.6 mg/dL (ref 8.7–10.3)
Chloride: 103 mmol/L (ref 96–106)
Creatinine, Ser: 0.7 mg/dL (ref 0.57–1.00)
Globulin, Total: 2.9 g/dL (ref 1.5–4.5)
Glucose: 95 mg/dL (ref 70–99)
Potassium: 3.8 mmol/L (ref 3.5–5.2)
Sodium: 142 mmol/L (ref 134–144)
Total Protein: 7.1 g/dL (ref 6.0–8.5)
eGFR: 98 mL/min/{1.73_m2} (ref >59)

## 2022-02-07 LAB — CBC WITH DIFFERENTIAL/PLATELET
Basophils Absolute: 0.1 10*3/uL (ref 0.0–0.2)
Basos: 1 %
EOS (ABSOLUTE): 0.3 10*3/uL (ref 0.0–0.4)
Eos: 4 %
Hematocrit: 49.5 % — ABNORMAL HIGH (ref 34.0–46.6)
Hemoglobin: 16.7 g/dL — ABNORMAL HIGH (ref 11.1–15.9)
Immature Grans (Abs): 0 10*3/uL (ref 0.0–0.1)
Immature Granulocytes: 0 %
Lymphocytes Absolute: 2.2 10*3/uL (ref 0.7–3.1)
Lymphs: 32 %
MCH: 29.7 pg (ref 26.6–33.0)
MCHC: 33.7 g/dL (ref 31.5–35.7)
MCV: 88 fL (ref 79–97)
Monocytes Absolute: 0.5 10*3/uL (ref 0.1–0.9)
Monocytes: 7 %
Neutrophils Absolute: 3.9 10*3/uL (ref 1.4–7.0)
Neutrophils: 56 %
Platelets: 257 10*3/uL (ref 150–450)
RBC: 5.62 x10E6/uL — ABNORMAL HIGH (ref 3.77–5.28)
RDW: 13.1 % (ref 11.7–15.4)
WBC: 7 10*3/uL (ref 3.4–10.8)

## 2022-02-07 LAB — TSH: TSH: 0.904 u[IU]/mL (ref 0.450–4.500)

## 2022-02-22 ENCOUNTER — Encounter: Admission: RE | Payer: Self-pay | Source: Home / Self Care

## 2022-02-22 ENCOUNTER — Ambulatory Visit
Admission: RE | Admit: 2022-02-22 | Payer: BC Managed Care – PPO | Source: Home / Self Care | Admitting: Gastroenterology

## 2022-02-22 SURGERY — COLONOSCOPY
Anesthesia: General

## 2022-02-27 DIAGNOSIS — K219 Gastro-esophageal reflux disease without esophagitis: Secondary | ICD-10-CM | POA: Diagnosis not present

## 2022-02-27 DIAGNOSIS — Z86010 Personal history of colonic polyps: Secondary | ICD-10-CM | POA: Diagnosis not present

## 2022-02-27 DIAGNOSIS — R1310 Dysphagia, unspecified: Secondary | ICD-10-CM | POA: Diagnosis not present

## 2022-02-27 DIAGNOSIS — K5909 Other constipation: Secondary | ICD-10-CM | POA: Diagnosis not present

## 2022-03-08 ENCOUNTER — Encounter: Payer: Self-pay | Admitting: Pulmonary Disease

## 2022-03-08 ENCOUNTER — Ambulatory Visit (INDEPENDENT_AMBULATORY_CARE_PROVIDER_SITE_OTHER): Payer: BC Managed Care – PPO | Admitting: Pulmonary Disease

## 2022-03-08 VITALS — BP 140/70 | HR 66 | Temp 98.0°F | Ht 62.0 in | Wt 161.0 lb

## 2022-03-08 DIAGNOSIS — F17210 Nicotine dependence, cigarettes, uncomplicated: Secondary | ICD-10-CM | POA: Diagnosis not present

## 2022-03-08 DIAGNOSIS — J418 Mixed simple and mucopurulent chronic bronchitis: Secondary | ICD-10-CM | POA: Diagnosis not present

## 2022-03-08 DIAGNOSIS — G4733 Obstructive sleep apnea (adult) (pediatric): Secondary | ICD-10-CM

## 2022-03-08 MED ORDER — BREZTRI AEROSPHERE 160-9-4.8 MCG/ACT IN AERO: 2.0000 | INHALATION_SPRAY | Freq: Two times a day (BID) | RESPIRATORY_TRACT | 0 refills | Status: DC

## 2022-03-08 NOTE — Patient Instructions (Addendum)
We are scheduling a split-night sleep study.  That will be a study that we will check on your sleep and if you need to mask they will put the mask on and determine what pressure you need.  Please work on quitting smoking.  We discussed the proper use of patches which would require you to take the patch off at bedtime so it does not disrupt your sleep.  Please use your Breztri (yellow inhaler) correctly.  This is 2 puffs twice a day.  Make sure you rinse your mouth well after you use it.  You had significant amount of wheezing today likely due to the fact that you are not using this inhaler correctly.  Your oxygen level during walking today was 90%.  This means you do not need oxygen when you walk.  We will see you in follow-up in 2 to 3 months time call sooner should any new problems arise.

## 2022-03-08 NOTE — Progress Notes (Signed)
Subjective:    Patient ID: Monica Hull, female    DOB: 01-Mar-1961, 61 y.o.   MRN: [id number] Patient Care Team: Dorcas Carrow, DO as PCP - General (Family Medicine) Antonieta Iba, MD as PCP - Cardiology (Cardiology)  Chief Complaint  Patient presents with   Follow-up   HPI 61 year old female, current every day smoker currently smoking over a pack a day, with 39-pack-year history of smoking. PMH significant for COPD, sleep apnea, allergic rhinitis, GERD, hypertension, carotid stenosis, diet controlled diabetes mellitus, hyperlipidemia.  I last saw the patient on 21 September 2021 in the interim she has seen nurse practitioners as well. Maintained on Breztri 2 puffs twice a day.  Patient continues to have issues with often congestion and gets short of breath with any activity.  She is not using her Breztri correctly and only as needed.  I have advised her that this is a maintenance medication.  Most recent PFTs where on 26 September 2021 and showed stable to improved lung function with FEV1 at 74% FVC at 70% and FEV1/FVC at 82%.  No significant bronchodilator response.  Diffusion capacity was 82%.  She states that she has noted wheezing and her cough is daily particularly in the mornings productive of whitish to clear sputum.  As noted she is not using her Breztri correctly and she continues to smoke in excess of a pack of cigarettes per day.  Patient also has a past history of obstructive sleep apnea but has not used CPAP" years" she is not sleeping well having nocturnal awakenings.  He did have reverse right total shoulder arthroplasty with biceps tenodesis and April 2023.  He tolerated this well.  Does not endorse any other symptomatology.   Review of Systems A 10 point review of systems was performed and it is as noted above otherwise negative.  Patient Active Problem List   Diagnosis Date Noted   Aortic atherosclerosis (HCC) 04/08/2019   Syncope 03/24/2018   Allergic rhinitis  02/27/2017   Current smoker 02/20/2016   Carotid stenosis 02/16/2016   Thyroid nodule 11/01/2015   Type 2 diabetes mellitus with other specified complication (HCC) 08/18/2015   PMB (postmenopausal bleeding) 06/30/2015   Obesity (BMI 30.0-34.9) 06/30/2015   Paresthesias 02/01/2015   DJD (degenerative joint disease), cervical 02/01/2015   Stress incontinence 12/21/2014   Asthma    GERD (gastroesophageal reflux disease)    Sleep apnea    Hyperlipidemia    Hypertension    COPD (chronic obstructive pulmonary disease) (HCC)    Nicotine dependence, cigarettes, w unsp disorders    Social History   Tobacco Use   Smoking status: Every Day    Packs/day: 1.00    Years: 39.00    Total pack years: 39.00    Types: Cigarettes   Smokeless tobacco: Never   Tobacco comments:    Smoking a little over a ppf 03/08/2022 hfb  Substance Use Topics   Alcohol use: Never   Allergies  Allergen Reactions   Oxycodone    Advair Hfa [Fluticasone-Salmeterol] Rash    Rash and thrush   Anoro Ellipta [Umeclidinium-Vilanterol] Rash    Causes patients tongue to break out   Biaxin [Clarithromycin] Nausea And Vomiting and Rash   Current Meds  Medication Sig   albuterol (PROVENTIL) (2.5 MG/3ML) 0.083% nebulizer solution Take 3 mLs (2.5 mg total) by nebulization every 6 (six) hours as needed for wheezing or shortness of breath.   albuterol (VENTOLIN HFA) 108 (90 Base) MCG/ACT inhaler  Inhale 2 puffs into the lungs every 6 (six) hours as needed for wheezing or shortness of breath.   aspirin EC 81 MG tablet Take 81 mg by mouth daily. Swallow whole.   atorvastatin (LIPITOR) 80 MG tablet Take 1 tablet (80 mg total) by mouth daily.   benzonatate (TESSALON) 200 MG capsule Take 1 capsule (200 mg total) by mouth 2 (two) times daily as needed for cough.   Budeson-Glycopyrrol-Formoterol (BREZTRI AEROSPHERE) 160-9-4.8 MCG/ACT AERO Inhale 2 puffs into the lungs in the morning and at bedtime.   Budeson-Glycopyrrol-Formoterol  (BREZTRI AEROSPHERE) 160-9-4.8 MCG/ACT AERO Inhale 2 puffs into the lungs in the morning and at bedtime.   celecoxib (CELEBREX) 200 MG capsule Take 200 mg by mouth 2 (two) times daily.   cetirizine (ZYRTEC) 10 MG tablet Take 10 mg by mouth daily as needed for allergies.   Dulaglutide (TRULICITY) 1.5 MG/0.5ML SOPN Inject 1.5 mg into the skin every Wednesday.   fesoterodine (TOVIAZ) 8 MG TB24 tablet Take 1 tablet (8 mg total) by mouth daily.   fexofenadine (ALLEGRA ALLERGY) 180 MG tablet Take 1 tablet (180 mg total) by mouth daily as needed for allergies.   fluticasone (FLONASE) 50 MCG/ACT nasal spray Place 2 sprays into both nostrils daily as needed for allergies.   linaclotide (LINZESS) 145 MCG CAPS capsule Take 145 mcg by mouth daily before breakfast.   lisinopril (ZESTRIL) 40 MG tablet Take 1 tablet (40 mg total) by mouth daily.   metFORMIN (GLUCOPHAGE-XR) 500 MG 24 hr tablet Take 2 tablets (1,000 mg total) by mouth daily with breakfast.   pantoprazole (PROTONIX) 40 MG tablet Take 1 tablet (40 mg total) by mouth every morning.   Spacer/Aero-Holding Chambers (AEROCHAMBER MV) inhaler Use as instructed   Spacer/Aero-Holding Chambers (AEROCHAMBER MV) inhaler Use as instructed   tiZANidine (ZANAFLEX) 4 MG capsule Take 4 mg by mouth 3 (three) times daily as needed for muscle spasms. Reported on 07/11/2015   trolamine salicylate (ASPERCREME) 10 % cream Apply 1 application  topically as needed for muscle pain.   Immunization History  Administered Date(s) Administered   Influenza,inj,Quad PF,6+ Mos 03/07/2015, 02/29/2016, 05/09/2017, 03/24/2018, 03/05/2019, 04/14/2020, 04/25/2021   Influenza-Unspecified 03/25/2014   PFIZER(Purple Top)SARS-COV-2 Vaccination 09/10/2019, 10/06/2019, 04/14/2020   Pneumococcal Polysaccharide-23 09/17/2014   Tdap 12/21/2014   Zoster Recombinat (Shingrix) 02/07/2021        Objective:   Physical Exam BP (!) 140/70 (BP Location: Left Arm, Patient Position: Sitting, Cuff  Size: Large)   Pulse 66   Temp 98 F (36.7 C) (Oral)   Ht 5\' 2"  (1.575 m)   Wt 161 lb (73 kg)   SpO2 97%   BMI 29.45 kg/m  GENERAL: Somewhat disheveled, overweight woman, no acute distress, fully ambulatory, melts heavily of tobacco.  No conversational dyspnea.  Looks much older than stated age HEAD: Normocephalic, atraumatic.  EYES: Pupils equal, round, reactive to light.  No scleral icterus.  MOUTH: Very poor dentition.  Oral mucosa moist. NECK: Supple. No thyromegaly. Trachea midline. No JVD.  No adenopathy. PULMONARY: Good air entry bilaterally.  Coarse breath sounds with few rhonchi, scattered wheezes. CARDIOVASCULAR: S1 and S2. Regular rate and rhythm.  No rubs, murmurs or gallops heard. ABDOMEN: Obese otherwise benign. MUSCULOSKELETAL: No joint deformity, no clubbing, no edema.  NEUROLOGIC: Neuro grossly normal. SKIN: Intact,warm,dry. PSYCH: Mood and behavior normal.  Ambulatory oximetry was performed today: Patient maintained saturations of 90% or above giving the study.     Assessment & Plan:     ICD-10-CM  1. Obstructive sleep apnea syndrome  G47.33 Split night study   Patient has a history of OSA in past [place] of CPAP use "years" Will need restudy due to recurrent symptoms    2. Mixed simple and mucopurulent chronic bronchitis (HCC)  J41.8    Poorly compensated due to improper use of inhaler Instructed on proper use of Breztri Needs to quit smoking    3. Tobacco dependence due to cigarettes  F17.210    Patient counseled regards discontinuation of smoking Total counseling time 3 to 5 minutes     Orders Placed This Encounter  Procedures   Split night study    Standing Status:   Future    Standing Expiration Date:   03/09/2023    Order Specific Question:   Where should this test be performed:    Answer:   [facility]   Meds ordered this encounter  Medications   Budeson-Glycopyrrol-Formoterol (BREZTRI AEROSPHERE) 160-9-4.8 MCG/ACT AERO    Sig: Inhale 2 puffs  into the lungs in the morning and at bedtime.    Dispense:  5.9 g    Refill:  0    Order Specific Question:   Lot Number?    Answer:   [id number]    Order Specific Question:   Expiration Date?    Answer:   07/25/2024    Order Specific Question:   Manufacturer?    Answer:   [employer] [[id number]]    Order Specific Question:   Quantity    Answer:   2   Patient was taught the proper use of the Breztri inhaler.  Also counseled regards discontinuation of smoking she was taught the proper use of nicotine replacement patches and advised that these can be obtained over-the-counter.  We will see the patient in follow-up in 2 to 3 months time she is to contact us prior to that time should any new difficulties arise.  Gailen Shelter, MD Advanced Bronchoscopy PCCM [facility]    *This note was dictated using voice recognition software/Dragon.  Despite best efforts to proofread, errors can occur which can change the meaning. Any transcriptional errors that result from this process are unintentional and may not be fully corrected at the time of dictation.

## 2022-03-09 ENCOUNTER — Telehealth (INDEPENDENT_AMBULATORY_CARE_PROVIDER_SITE_OTHER): Payer: BC Managed Care – PPO | Admitting: Physician Assistant

## 2022-03-09 ENCOUNTER — Encounter: Payer: Self-pay | Admitting: Physician Assistant

## 2022-03-09 DIAGNOSIS — H109 Unspecified conjunctivitis: Secondary | ICD-10-CM

## 2022-03-09 DIAGNOSIS — J309 Allergic rhinitis, unspecified: Secondary | ICD-10-CM | POA: Diagnosis not present

## 2022-03-09 MED ORDER — FEXOFENADINE HCL 180 MG PO TABS: 180.0000 mg | ORAL_TABLET | Freq: Every day | ORAL | 2 refills | Status: DC | PRN

## 2022-03-09 MED ORDER — ERYTHROMYCIN 5 MG/GM OP OINT: 1.0000 | TOPICAL_OINTMENT | Freq: Four times a day (QID) | OPHTHALMIC | 1 refills | Status: DC

## 2022-03-09 NOTE — Patient Instructions (Signed)
Based on your symptoms I believe that you have bacterial conjunctivitis   I have sent in a script for Erythromycin ophthalmic ointment - please apply a 1/2 inch strip of the ointment to your right eye every 6 hours for 7 days   You can use sterile eye flushes and lubricating eye drops to assist with eye irritation and further resolution You can also use a warm compress over the eye to assist with swelling and matting - especially in the morning  If you have used makeup or mascara on that eye I recommend discarding it as this can cause recurrent infection. Thoroughly wash any makeup brushes and avoid using makeup while recovering from the infection.  If you notice the following please return to the office: lack of improvement, eyelid swelling, increased eye irritation If you notice the following please go to the ED: eye pressure causing displacement of the eye, vision changes, increased eye pain or foreign body sensation, fever

## 2022-03-09 NOTE — Progress Notes (Signed)
Virtual Visit via Video Note  I connected with Monica Hull on 03/09/22 at  9:00 AM EDT by a video enabled telemedicine application and verified that I am speaking with the correct person using two identifiers.  Today's Provider: Jacquelin Hawking, MHS, PA-C Introduced myself to the patient as a PA-C and provided education on APPs in clinical practice.    Location: Patient: at home, [place], [place]  Provider: [facility], [place], [place]    I discussed the limitations of evaluation and management by telemedicine and the availability of in person appointments. The patient expressed understanding and agreed to proceed.   Chief Complaint  Patient presents with   Headache   Eye Problem    Patient states that her eyes are red, swollen, crusty and are draining. Patient also states that all of her grandkids have had pink eye in the past 2 weeks    History of Present Illness:  Reports she thinks she has pink eye States her eyes are watery and "gooey"  States it's just the right eye at this time  States her grandchildren have had pink eye the last few weeks   Reports some blurry vision with right eye  States her eye felt matted shut this morning Onset: sudden Duration: last night  She does not wear contacts    Review of Systems  Constitutional:  Negative for chills and fever.  HENT:  Negative for congestion, nosebleeds and sore throat.   Eyes:  Positive for pain, discharge and redness. Negative for blurred vision, double vision and photophobia.  Neurological:  Positive for headaches.      Observations/Objective:  Due to the nature of the virtual visit, physical exam and observations are limited. Able to obtain the following observations:   Alert, oriented, Appears comfortable, in no acute distress.  No scleral injection, no appreciated hoarseness, tachypnea, wheeze or strider. Able to maintain conversation without visible strain.  No cough appreciated  during visit.   Eyes Right eye is mildly swollen at upper and lower lids No injection or scleral erythema noted but virtual visit limits observation Some watery discharge noted during apt from right eye  Assessment and Plan:     Problem List Items Addressed This Visit       Respiratory   Allergic rhinitis    Chronic, recurrent concern Will send in refills of Allegra per request      Relevant Medications   fexofenadine (ALLEGRA ALLERGY) 180 MG tablet   Other Visit Diagnoses     Bacterial conjunctivitis of right eye    -  Primary Acute, new concern Reports symptoms started last night and woke up with matting in right eye Will provide Erythromycin ophthalmic ointment - discussed signs of allergic reaction as she has allergy to Clarithromycin- she voiced understanding Provided eye care instructions for symptomatic relief and prevention of reinfection Follow up as needed for persistent or progressing symptoms    Relevant Medications   erythromycin ophthalmic ointment        No follow-ups on file.   I, Natahsa Marian E Laqueta Bonaventura, PA-C, have reviewed all documentation for this visit. The documentation on 03/09/22 for the exam, diagnosis, procedures, and orders are all accurate and complete.   Jacquelin Hawking, MHS, PA-C [facility]    I discussed the assessment and treatment plan with the patient. The patient was provided an opportunity to ask questions and all were answered. The patient agreed with the plan and demonstrated an  understanding of the instructions.   The patient was advised to call back or seek an in-person evaluation if the symptoms worsen or if the condition fails to improve as anticipated.  I provided 9 minutes of non-face-to-face time during this encounter.   Tionne Dayhoff E Kjersten Ormiston, PA-C

## 2022-03-09 NOTE — Assessment & Plan Note (Signed)
Chronic, recurrent concern Will send in refills of Allegra per request

## 2022-04-16 DIAGNOSIS — M19011 Primary osteoarthritis, right shoulder: Secondary | ICD-10-CM | POA: Diagnosis not present

## 2022-04-16 DIAGNOSIS — Z96611 Presence of right artificial shoulder joint: Secondary | ICD-10-CM | POA: Diagnosis not present

## 2022-04-27 ENCOUNTER — Encounter: Payer: Self-pay | Admitting: Pulmonary Disease

## 2022-05-08 ENCOUNTER — Ambulatory Visit (INDEPENDENT_AMBULATORY_CARE_PROVIDER_SITE_OTHER): Payer: BC Managed Care – PPO | Admitting: Pulmonary Disease

## 2022-05-08 ENCOUNTER — Encounter: Payer: Self-pay | Admitting: Pulmonary Disease

## 2022-05-08 VITALS — BP 122/82 | HR 85 | Temp 97.9°F | Ht 62.0 in | Wt 160.4 lb

## 2022-05-08 DIAGNOSIS — J440 Chronic obstructive pulmonary disease with acute lower respiratory infection: Secondary | ICD-10-CM

## 2022-05-08 DIAGNOSIS — G4733 Obstructive sleep apnea (adult) (pediatric): Secondary | ICD-10-CM | POA: Diagnosis not present

## 2022-05-08 DIAGNOSIS — F17210 Nicotine dependence, cigarettes, uncomplicated: Secondary | ICD-10-CM | POA: Diagnosis not present

## 2022-05-08 MED ORDER — METHYLPREDNISOLONE 4 MG PO TBPK
ORAL_TABLET | ORAL | 0 refills | Status: DC
Start: 2022-05-08 — End: 2022-07-02

## 2022-05-08 MED ORDER — DOXYCYCLINE HYCLATE 100 MG PO TABS: 100.0000 mg | ORAL_TABLET | Freq: Two times a day (BID) | ORAL | 0 refills | Status: AC

## 2022-05-08 NOTE — Progress Notes (Signed)
Subjective:    Patient ID: Monica Hull, female    DOB: 11/06/1960, 61 y.o.   MRN: [id number]  Patient Care Team: Dorcas Carrow, DO as PCP - General (Family Medicine) Antonieta Iba, MD as PCP - Cardiology (Cardiology) Salena Saner, MD as Consulting Physician (Pulmonary Disease)  Chief Complaint  Patient presents with   Follow-up    COPD. SOB with exertion. Wheezing. Dry cough.Chills for a couple of days.     HPI This is a 61 year old female, current every day smoker currently smoking over a pack a day, with 39-pack-year history of smoking. PMH significant for COPD, sleep apnea, allergic rhinitis, GERD, hypertension, carotid stenosis, diet controlled diabetes mellitus, hyperlipidemia.  I last saw the patient on 08 March 2022.  She is maintained on Breztri 2 puffs twice a day.  Patient continues to have issues with often congestion and gets short of breath with any activity.  She has had increasing nasal congestion, purulent nasal discharge and chills for the last 2 to 3 days.  She also has noted increased shortness of breath with exertion with the symptoms.  No fevers documented.  She is vaccinated for flu and RSV.  Sputum has changed from white to pale yellow and at times screaming.  She has not had any chest pain, no orthopnea no paroxysmal nocturnal dyspnea.  She does not endorse any other symptomatology.  DATA 09/26/2021 PFTs: FEV1 1.70 L or 71% predicted, FVC 2.13 L or 68% predicted, FEV1/FVC 80%, no bronchodilator response.  Lung volumes normal, diffusion capacity normal.  Patient had difficulty during the test.  No significant obstruction.   Review of Systems A 10 point review of systems was performed and it is as noted above otherwise negative.  Patient Active Problem List   Diagnosis Date Noted   Aortic atherosclerosis (HCC) 04/08/2019   Syncope 03/24/2018   Allergic rhinitis 02/27/2017   Current smoker 02/20/2016   Carotid stenosis 02/16/2016    Thyroid nodule 11/01/2015   Type 2 diabetes mellitus with other specified complication (HCC) 08/18/2015   PMB (postmenopausal bleeding) 06/30/2015   Obesity (BMI 30.0-34.9) 06/30/2015   Paresthesias 02/01/2015   DJD (degenerative joint disease), cervical 02/01/2015   Stress incontinence 12/21/2014   Asthma    GERD (gastroesophageal reflux disease)    Sleep apnea    Hyperlipidemia    Hypertension    COPD (chronic obstructive pulmonary disease) (HCC)    Nicotine dependence, cigarettes, w unsp disorders    Social History   Tobacco Use   Smoking status: Every Day    Packs/day: 1.00    Years: 39.00    Total pack years: 39.00    Types: Cigarettes   Smokeless tobacco: Never   Tobacco comments:    Smoking a little over a ppf 03/08/2022 hfb  Substance Use Topics   Alcohol use: Never   Allergies  Allergen Reactions   Oxycodone    Advair Hfa [Fluticasone-Salmeterol] Rash    Rash and thrush   Anoro Ellipta [Umeclidinium-Vilanterol] Rash    Causes patients tongue to break out   Biaxin [Clarithromycin] Nausea And Vomiting and Rash   Immunization History  Administered Date(s) Administered   Influenza,inj,Quad PF,6+ Mos 03/07/2015, 02/29/2016, 05/09/2017, 03/24/2018, 03/05/2019, 04/14/2020, 04/25/2021   Influenza-Unspecified 03/25/2014, 04/07/2022   PFIZER(Purple Top)SARS-COV-2 Vaccination 09/10/2019, 10/06/2019, 04/14/2020   Pneumococcal Polysaccharide-23 09/17/2014   Respiratory Syncytial Virus Vaccine,Recomb Aduvanted(Arexvy) 04/07/2022   Tdap 12/21/2014   Zoster Recombinat (Shingrix) 02/07/2021       Objective:  Physical Exam BP 122/82 (BP Location: Left Arm, Cuff Size: Normal)   Pulse 85   Temp 97.9 F (36.6 C)   Ht 5\' 2"  (1.575 m)   Wt 160 lb 6.4 oz (72.8 kg)   SpO2 96%   BMI 29.34 kg/m  GENERAL: Somewhat disheveled, overweight woman, no acute distress, fully ambulatory, smells heavily of tobacco.  No conversational dyspnea.  Looks much older than stated age,  plethoric. HEAD: Normocephalic, atraumatic.  EYES: Pupils equal, round, reactive to light.  No scleral icterus.  MOUTH: Very poor dentition, missing teeth.  Oral mucosa moist.  No thrush. NECK: Supple. No thyromegaly. Trachea midline. No JVD.  No adenopathy. PULMONARY: Good air entry bilaterally.  Coarse breath sounds with scattered rhonchi and wheezes. CARDIOVASCULAR: S1 and S2. Regular rate and rhythm.  No rubs, murmurs or gallops heard. ABDOMEN: Obese otherwise benign. MUSCULOSKELETAL: No joint deformity, no clubbing, no edema.  NEUROLOGIC: Neuro grossly normal. SKIN: Intact,warm,dry. PSYCH: Mood and behavior normal.      Assessment & Plan:     ICD-10-CM   1. COPD with acute lower respiratory infection (HCC)  J44.0    We will treat with doxycycline and methylprednisolone Continue Breztri 2 puffs twice a day and as needed albuterol STOP SMOKING!    2. Obstructive sleep apnea syndrome  G47.33    To have in lab sleep study 11/30 Will follow on results    3. Tobacco dependence due to cigarettes  F17.210    Patient was counseled regards to discontinuation of smoking Has lung cancer screening CT scheduled for 11/29     Meds ordered this encounter  Medications   methylPREDNISolone (MEDROL DOSEPAK) 4 MG TBPK tablet    Sig: Take as directed in package    Dispense:  21 tablet    Refill:  0   doxycycline (VIBRA-TABS) 100 MG tablet    Sig: Take 1 tablet (100 mg total) by mouth 2 (two) times daily for 7 days.    Dispense:  14 tablet    Refill:  0    We will see the patient in follow-up in 2 to 3 months time.  We will call her in the interim with the results of the above pending studies.  12/29, MD [facility]    *This note was dictated using voice recognition software/Dragon.  Despite best efforts to proofread, errors can occur which can change the meaning. Any transcriptional errors that result from this process are  unintentional and may not be fully corrected at the time of dictation.

## 2022-05-08 NOTE — Patient Instructions (Signed)
We have sent in the prescriptions to your pharmacy for the medication for your acute bronchitis and sinus infection.  With the antibiotic to be aware of not spending a  long time in the sun as it can cause a sunburn.  Keep your appointments for your low-dose CT (lung cancer screening) and for the sleep study.  We will see you in follow-up in 2 to 3 months time call sooner should any new problems arise.

## 2022-05-15 ENCOUNTER — Ambulatory Visit: Payer: BC Managed Care – PPO | Attending: Otolaryngology

## 2022-05-15 DIAGNOSIS — R058 Other specified cough: Secondary | ICD-10-CM | POA: Diagnosis not present

## 2022-05-15 DIAGNOSIS — J4489 Other specified chronic obstructive pulmonary disease: Secondary | ICD-10-CM | POA: Insufficient documentation

## 2022-05-15 DIAGNOSIS — R062 Wheezing: Secondary | ICD-10-CM | POA: Diagnosis not present

## 2022-05-15 DIAGNOSIS — E119 Type 2 diabetes mellitus without complications: Secondary | ICD-10-CM | POA: Insufficient documentation

## 2022-05-15 DIAGNOSIS — M1990 Unspecified osteoarthritis, unspecified site: Secondary | ICD-10-CM | POA: Diagnosis not present

## 2022-05-15 DIAGNOSIS — G4733 Obstructive sleep apnea (adult) (pediatric): Secondary | ICD-10-CM | POA: Insufficient documentation

## 2022-05-15 DIAGNOSIS — R0683 Snoring: Secondary | ICD-10-CM | POA: Insufficient documentation

## 2022-05-15 DIAGNOSIS — K219 Gastro-esophageal reflux disease without esophagitis: Secondary | ICD-10-CM | POA: Insufficient documentation

## 2022-05-15 DIAGNOSIS — Z8709 Personal history of other diseases of the respiratory system: Secondary | ICD-10-CM | POA: Insufficient documentation

## 2022-05-15 DIAGNOSIS — R0602 Shortness of breath: Secondary | ICD-10-CM | POA: Insufficient documentation

## 2022-05-15 DIAGNOSIS — I10 Essential (primary) hypertension: Secondary | ICD-10-CM | POA: Insufficient documentation

## 2022-05-16 ENCOUNTER — Encounter: Payer: Self-pay | Admitting: Gastroenterology

## 2022-05-20 NOTE — H&P (Incomplete)
Pre-Procedure H&P   Patient ID: Monica Hull is a 61 y.o. female.  Gastroenterology Provider: Jaynie Collins, DO  Referring Provider: Tawni Pummel, PA PCP: Dorcas Carrow, DO  Date: 05/20/2022  HPI [name] is a 61 y.o. female who presents today for Esophagogastroduodenoscopy and Colonoscopy for Dysphagia, red blood per rectum .  Patient presenting with dysphagia.  Longstanding history of tobacco dependence with 1/2 packs/day.  Active aspirin and Celebrex use.  She is also on Trulicity which been held for this procedure***  Longstanding constipation which initially improved with Linzess.  She is now back to have 1 bowel movement per week.  She does drink a fair amount of soft drinks without adequate water intake.  She also notes some bright red blood per rectum with straining  Last colonoscopy 2016 with polyps with 5-year repeat recommended. Mother with a history of pancreatic cancer  Most recent lab work hemoglobin 16.7 MCV 88 platelets 257,000 creatinine 0.7   Past Medical History:  Diagnosis Date   Arthritis    Asthma    Carpal tunnel syndrome    Chronic kidney disease    H/O KIDNEY STONES   COPD (chronic obstructive pulmonary disease) (HCC)    Diabetes mellitus without complication (HCC)    DIET CONTROLLED   Elevated liver function tests    GERD (gastroesophageal reflux disease)    Headache    MIGRAINES   History of kidney stones    Hyperlipidemia    Hypertension    Impaired fasting glucose    Pneumonia    Sleep apnea    Not using CPAP- referred to sleep center   Tobacco abuse     Past Surgical History:  Procedure Laterality Date   CARPAL TUNNEL RELEASE     CESAREAN SECTION     X2   DILATION AND CURETTAGE OF UTERUS     HYSTEROSCOPY WITH D & C N/A 07/11/2015   Procedure: DILATATION AND CURETTAGE /HYSTEROSCOPY;  Surgeon: Hildred Laser, MD;  Location: [facility] ORS;  Service: Gynecology;  Laterality: N/A;   PALATE / UVULA  BIOPSY / EXCISION     REVERSE SHOULDER ARTHROPLASTY Right 10/12/2021   Procedure: REVERSE TOTAL SHOULDER ARTHROPLASTY WITH BICEPS TENODESIS.;  Surgeon: Christena Flake, MD;  Location: [facility] OR[facility];  Service: Orthopedics;  Laterality: Right;    Family History Mother-pancreatic cancer No other h/o GI disease or malignancy  Review of Systems  Constitutional:  Negative for activity change, appetite change, chills, diaphoresis, fatigue, fever and unexpected weight change.  HENT:  Positive for trouble swallowing. Negative for voice change.   Respiratory:  Negative for shortness of breath and wheezing.   Cardiovascular:  Negative for chest pain, palpitations and leg swelling.  Gastrointestinal:  Positive for blood in stool and constipation. Negative for abdominal distention, abdominal pain, anal bleeding, diarrhea, nausea, rectal pain and vomiting.  Musculoskeletal:  Negative for arthralgias and myalgias.  Skin:  Negative for color change and pallor.  Neurological:  Negative for dizziness, syncope and weakness.  Psychiatric/Behavioral:  Negative for confusion.   All other systems reviewed and are negative.    Medications Current Facility-Administered Medications on File Prior to Encounter  Medication Dose Route Frequency Provider Last Rate Last Admin   albuterol (PROVENTIL) (2.5 MG/3ML) 0.083% nebulizer solution 2.5 mg  2.5 mg Nebulization Once Johnson, Megan P, DO       Current Outpatient Medications on File Prior to Encounter  Medication Sig Dispense Refill   albuterol (PROVENTIL) (2.5 MG/3ML) 0.083%  nebulizer solution Take 3 mLs (2.5 mg total) by nebulization every 6 (six) hours as needed for wheezing or shortness of breath. 150 mL 1   albuterol (VENTOLIN HFA) 108 (90 Base) MCG/ACT inhaler Inhale 2 puffs into the lungs every 6 (six) hours as needed for wheezing or shortness of breath. 18 g 6   aspirin EC 81 MG tablet Take 81 mg by mouth daily. Swallow whole.     atorvastatin (LIPITOR) 80 MG  tablet Take 1 tablet (80 mg total) by mouth daily. 90 tablet 1   benzonatate (TESSALON) 200 MG capsule Take 1 capsule (200 mg total) by mouth 2 (two) times daily as needed for cough. 20 capsule 0   Budeson-Glycopyrrol-Formoterol (BREZTRI AEROSPHERE) 160-9-4.8 MCG/ACT AERO Inhale 2 puffs into the lungs in the morning and at bedtime. 32.1 g 3   celecoxib (CELEBREX) 200 MG capsule Take 200 mg by mouth 2 (two) times daily.     cetirizine (ZYRTEC) 10 MG tablet Take 10 mg by mouth daily as needed for allergies.     Dulaglutide (TRULICITY) 1.5 MG/0.5ML SOPN Inject 1.5 mg into the skin every Wednesday. 6 mL 1   fesoterodine (TOVIAZ) 8 MG TB24 tablet Take 1 tablet (8 mg total) by mouth daily. 90 tablet 3   fluticasone (FLONASE) 50 MCG/ACT nasal spray Place 2 sprays into both nostrils daily as needed for allergies.     linaclotide (LINZESS) 145 MCG CAPS capsule Take 145 mcg by mouth daily before breakfast.     lisinopril (ZESTRIL) 40 MG tablet Take 1 tablet (40 mg total) by mouth daily. 90 tablet 1   metFORMIN (GLUCOPHAGE-XR) 500 MG 24 hr tablet Take 2 tablets (1,000 mg total) by mouth daily with breakfast. 180 tablet 1   pantoprazole (PROTONIX) 40 MG tablet Take 1 tablet (40 mg total) by mouth every morning. 90 tablet 1   Spacer/Aero-Holding Chambers (AEROCHAMBER MV) inhaler Use as instructed 1 each 0   Spacer/Aero-Holding Chambers (AEROCHAMBER MV) inhaler Use as instructed 1 each 0   tiZANidine (ZANAFLEX) 4 MG capsule Take 4 mg by mouth 3 (three) times daily as needed for muscle spasms. Reported on 07/11/2015     trolamine salicylate (ASPERCREME) 10 % cream Apply 1 application  topically as needed for muscle pain.      Pertinent medications related to GI and procedure were reviewed by me with the patient prior to the procedure   Current Facility-Administered Medications:    albuterol (PROVENTIL) (2.5 MG/3ML) 0.083% nebulizer solution 2.5 mg, 2.5 mg, Nebulization, Once, Johnson, Megan P, DO  Current  Outpatient Medications:    albuterol (PROVENTIL) (2.5 MG/3ML) 0.083% nebulizer solution, Take 3 mLs (2.5 mg total) by nebulization every 6 (six) hours as needed for wheezing or shortness of breath., Disp: 150 mL, Rfl: 1   albuterol (VENTOLIN HFA) 108 (90 Base) MCG/ACT inhaler, Inhale 2 puffs into the lungs every 6 (six) hours as needed for wheezing or shortness of breath., Disp: 18 g, Rfl: 6   aspirin EC 81 MG tablet, Take 81 mg by mouth daily. Swallow whole., Disp: , Rfl:    atorvastatin (LIPITOR) 80 MG tablet, Take 1 tablet (80 mg total) by mouth daily., Disp: 90 tablet, Rfl: 1   benzonatate (TESSALON) 200 MG capsule, Take 1 capsule (200 mg total) by mouth 2 (two) times daily as needed for cough., Disp: 20 capsule, Rfl: 0   Budeson-Glycopyrrol-Formoterol (BREZTRI AEROSPHERE) 160-9-4.8 MCG/ACT AERO, Inhale 2 puffs into the lungs in the morning and at bedtime., Disp: 32.1 g,  Rfl: 3   Budeson-Glycopyrrol-Formoterol (BREZTRI AEROSPHERE) 160-9-4.8 MCG/ACT AERO, Inhale 2 puffs into the lungs in the morning and at bedtime., Disp: 5.9 g, Rfl: 0   celecoxib (CELEBREX) 200 MG capsule, Take 200 mg by mouth 2 (two) times daily., Disp: , Rfl:    cetirizine (ZYRTEC) 10 MG tablet, Take 10 mg by mouth daily as needed for allergies., Disp: , Rfl:    Dulaglutide (TRULICITY) 1.5 MG/0.5ML SOPN, Inject 1.5 mg into the skin every Wednesday., Disp: 6 mL, Rfl: 1   erythromycin ophthalmic ointment, Place 1 Application into the right eye every 6 (six) hours. Place 1/2 inch ribbon of ointment in the affected eye 4 times a day (Patient not taking: Reported on 05/08/2022), Disp: 1 g, Rfl: 1   fesoterodine (TOVIAZ) 8 MG TB24 tablet, Take 1 tablet (8 mg total) by mouth daily., Disp: 90 tablet, Rfl: 3   fexofenadine (ALLEGRA ALLERGY) 180 MG tablet, Take 1 tablet (180 mg total) by mouth daily as needed for allergies., Disp: 30 tablet, Rfl: 2   fluticasone (FLONASE) 50 MCG/ACT nasal spray, Place 2 sprays into both nostrils daily as  needed for allergies., Disp: , Rfl:    linaclotide (LINZESS) 145 MCG CAPS capsule, Take 145 mcg by mouth daily before breakfast., Disp: , Rfl:    lisinopril (ZESTRIL) 40 MG tablet, Take 1 tablet (40 mg total) by mouth daily., Disp: 90 tablet, Rfl: 1   metFORMIN (GLUCOPHAGE-XR) 500 MG 24 hr tablet, Take 2 tablets (1,000 mg total) by mouth daily with breakfast., Disp: 180 tablet, Rfl: 1   methylPREDNISolone (MEDROL DOSEPAK) 4 MG TBPK tablet, Take as directed in package, Disp: 21 tablet, Rfl: 0   pantoprazole (PROTONIX) 40 MG tablet, Take 1 tablet (40 mg total) by mouth every morning., Disp: 90 tablet, Rfl: 1   Spacer/Aero-Holding Chambers (AEROCHAMBER MV) inhaler, Use as instructed, Disp: 1 each, Rfl: 0   Spacer/Aero-Holding Chambers (AEROCHAMBER MV) inhaler, Use as instructed, Disp: 1 each, Rfl: 0   tiZANidine (ZANAFLEX) 4 MG capsule, Take 4 mg by mouth 3 (three) times daily as needed for muscle spasms. Reported on 07/11/2015, Disp: , Rfl:    trolamine salicylate (ASPERCREME) 10 % cream, Apply 1 application  topically as needed for muscle pain., Disp: , Rfl:       Allergies  Allergen Reactions   Oxycodone    Advair Hfa [Fluticasone-Salmeterol] Rash    Rash and thrush   Anoro Ellipta [Umeclidinium-Vilanterol] Rash    Causes patients tongue to break out   Biaxin [Clarithromycin] Nausea And Vomiting and Rash   Allergies were reviewed by me prior to the procedure  Objective   There is no height or weight on file to calculate BMI. There were no vitals filed for this visit.  *** Physical Exam Vitals and nursing note reviewed.  Constitutional:      General: She is not in acute distress.    Appearance: Normal appearance. She is not ill-appearing, toxic-appearing or diaphoretic.  HENT:     Head: Normocephalic and atraumatic.     Nose: Nose normal.     Mouth/Throat:     Mouth: Mucous membranes are moist.     Pharynx: Oropharynx is clear.  Eyes:     General: No scleral icterus.     Extraocular Movements: Extraocular movements intact.  Cardiovascular:     Rate and Rhythm: Normal rate and regular rhythm.     Heart sounds: Normal heart sounds. No murmur heard.    No friction rub. No gallop.  Pulmonary:     Effort: Pulmonary effort is normal. No respiratory distress.     Breath sounds: Normal breath sounds. No wheezing, rhonchi or rales.  Abdominal:     General: Bowel sounds are normal. There is no distension.     Palpations: Abdomen is soft.     Tenderness: There is no abdominal tenderness. There is no guarding or rebound.  Musculoskeletal:     Cervical back: Neck supple.     Right lower leg: No edema.     Left lower leg: No edema.  Skin:    General: Skin is warm and dry.     Coloration: Skin is not jaundiced or pale.  Neurological:     General: No focal deficit present.     Mental Status: She is alert and oriented to person, place, and time. Mental status is at baseline.  Psychiatric:        Mood and Affect: Mood normal.        Behavior: Behavior normal.        Thought Content: Thought content normal.        Judgment: Judgment normal.      Assessment:  [name] is a 61 y.o. female  who presents today for Esophagogastroduodenoscopy and Colonoscopy for Dysphagia, red blood per rectum .  Plan:  Esophagogastroduodenoscopy and Colonoscopy with possible intervention today  Esophagogastroduodenoscopy and Colonoscopy with possible biopsy, control of bleeding, polypectomy, and interventions as necessary has been discussed with the patient/patient representative. Informed consent was obtained from the patient/patient representative after explaining the indication, nature, and risks of the procedure including but not limited to death, bleeding, perforation, missed neoplasm/lesions, cardiorespiratory compromise, and reaction to medications. Opportunity for questions was given and appropriate answers were provided. Patient/patient representative has  verbalized understanding is amenable to undergoing the procedure.   Jaynie Collins, DO  [facility]  Portions of the record may have been created with voice recognition software. Occasional wrong-word or 'sound-a-like' substitutions may have occurred due to the inherent limitations of voice recognition software.  Read the chart carefully and recognize, using context, where substitutions may have occurred.

## 2022-05-21 ENCOUNTER — Encounter: Payer: Self-pay | Admitting: Anesthesiology

## 2022-05-21 ENCOUNTER — Ambulatory Visit
Admission: RE | Admit: 2022-05-21 | Payer: BC Managed Care – PPO | Source: Home / Self Care | Admitting: Gastroenterology

## 2022-05-21 ENCOUNTER — Encounter: Admission: RE | Payer: Self-pay | Source: Home / Self Care

## 2022-05-21 SURGERY — COLONOSCOPY WITH PROPOFOL
Anesthesia: General

## 2022-05-23 ENCOUNTER — Ambulatory Visit
Admission: RE | Admit: 2022-05-23 | Discharge: 2022-05-23 | Disposition: A | Discharge status: Home / Self Care | Payer: BC Managed Care – PPO | Source: Ambulatory Visit | Attending: Family Medicine | Admitting: Family Medicine

## 2022-05-23 DIAGNOSIS — F17200 Nicotine dependence, unspecified, uncomplicated: Secondary | ICD-10-CM | POA: Insufficient documentation

## 2022-05-23 DIAGNOSIS — Z87891 Personal history of nicotine dependence: Secondary | ICD-10-CM | POA: Insufficient documentation

## 2022-05-23 DIAGNOSIS — F17210 Nicotine dependence, cigarettes, uncomplicated: Secondary | ICD-10-CM | POA: Insufficient documentation

## 2022-05-25 ENCOUNTER — Other Ambulatory Visit: Payer: Self-pay | Admitting: Acute Care

## 2022-05-25 DIAGNOSIS — F17210 Nicotine dependence, cigarettes, uncomplicated: Secondary | ICD-10-CM

## 2022-05-25 DIAGNOSIS — Z87891 Personal history of nicotine dependence: Secondary | ICD-10-CM

## 2022-05-25 DIAGNOSIS — Z122 Encounter for screening for malignant neoplasm of respiratory organs: Secondary | ICD-10-CM

## 2022-06-22 ENCOUNTER — Telehealth: Payer: Self-pay

## 2022-06-22 DIAGNOSIS — G4733 Obstructive sleep apnea (adult) (pediatric): Secondary | ICD-10-CM

## 2022-06-22 NOTE — Telephone Encounter (Signed)
Sleep study reviewed by Dr. Jayme Cloud- mild OSA recommend cpap 5-15cm h2O.   ATC patient--unable to leave vm due to mailbox being full.  Will call back.

## 2022-06-26 NOTE — Telephone Encounter (Signed)
Patient is aware of results and voiced her understanding.  She agrees with treatment plan. Order placed to [facility] for cpap. She will call back once setup on machine to schedule f/u. Nothing further needed.

## 2022-07-02 ENCOUNTER — Ambulatory Visit: Payer: BC Managed Care – PPO | Admitting: Nurse Practitioner

## 2022-07-02 ENCOUNTER — Ambulatory Visit: Payer: Self-pay

## 2022-07-02 ENCOUNTER — Encounter: Payer: Self-pay | Admitting: Nurse Practitioner

## 2022-07-02 VITALS — BP 146/84 | HR 71 | Temp 97.7°F | Wt 163.8 lb

## 2022-07-02 DIAGNOSIS — R051 Acute cough: Secondary | ICD-10-CM | POA: Diagnosis not present

## 2022-07-02 DIAGNOSIS — J069 Acute upper respiratory infection, unspecified: Secondary | ICD-10-CM

## 2022-07-02 NOTE — Telephone Encounter (Signed)
   Chief Complaint: Cough, wheezing. Appointment made for today, but asking to be worked in with PCP. Symptoms: Above Frequency: 2-3 days ago Pertinent Negatives: Patient denies fever Disposition: [] ED /[] Urgent Care (no appt availability in office) / [x] Appointment(In office/virtual)/ []  [facility]/ [] Home Care/ [] Refused Recommended Disposition /[] [facility]/ []  Follow-up with PCP Additional Notes: Please advise pt.   Reason for Disposition  [1] MILD difficulty breathing (e.g., minimal/no SOB at rest, SOB with walking, pulse <100) AND [2] still present when not coughing  Answer Assessment - Initial Assessment Questions 1. ONSET: "When did the cough begin?"      2-3 days ago 2. SEVERITY: "How bad is the cough today?"      Moderate 3. SPUTUM: "Describe the color of your sputum" (none, dry cough; clear, white, yellow, green)     No 4. HEMOPTYSIS: "Are you coughing up any blood?" If so ask: "How much?" (flecks, streaks, tablespoons, etc.)     No 5. DIFFICULTY BREATHING: "Are you having difficulty breathing?" If Yes, ask: "How bad is it?" (e.g., mild, moderate, severe)    - MILD: No SOB at rest, mild SOB with walking, speaks normally in sentences, can lie down, no retractions, pulse < 100.    - MODERATE: SOB at rest, SOB with minimal exertion and prefers to sit, cannot lie down flat, speaks in phrases, mild retractions, audible wheezing, pulse 100-120.    - SEVERE: Very SOB at rest, speaks in single words, struggling to breathe, sitting hunched forward, retractions, pulse > 120      Mild 6. FEVER: "Do you have a fever?" If Yes, ask: "What is your temperature, how was it measured, and when did it start?"     No 7. CARDIAC HISTORY: "Do you have any history of heart disease?" (e.g., heart attack, congestive heart failure)      No 8. LUNG HISTORY: "Do you have any history of lung disease?"  (e.g., pulmonary embolus, asthma, emphysema)     COPD 9. PE RISK FACTORS: "Do  you have a history of blood clots?" (or: recent major surgery, recent prolonged travel, bedridden)     No 10. OTHER SYMPTOMS: "Do you have any other symptoms?" (e.g., runny nose, wheezing, chest pain)       Wheezing 11. PREGNANCY: "Is there any chance you are pregnant?" "When was your last menstrual period?"       No 12. TRAVEL: "Have you traveled out of the country in the last month?" (e.g., travel history, exposures)       No  Protocols used: Cough - Acute Non-Productive-A-AH

## 2022-07-02 NOTE — Progress Notes (Unsigned)
BP (!) 146/84   Pulse 71   Temp 97.7 F (36.5 C) (Oral)   Wt 163 lb 12.8 oz (74.3 kg)   SpO2 97%   BMI 29.96 kg/m    Subjective:    Patient ID: Monica Hull, female    DOB: 10-10-1960, 62 y.o.   MRN: [id number]  HPI: Monica Hull is a 62 y.o. female  Chief Complaint  Patient presents with   URI   UPPER RESPIRATORY TRACT INFECTION Worst symptom: symptoms started about 3 days ago Fever: no Cough: yes Shortness of breath: yes Wheezing: no (comes and goes) Chest pain: no Chest tightness: no Chest congestion: no Nasal congestion: yes Runny nose: yes Post nasal drip: yes Sneezing: no Sore throat: no Swollen glands: no Sinus pressure: no Headache: yes Face pain: no Toothache: no Ear pain: no bilateral Ear pressure: no bilateral Eyes red/itching:no Eye drainage/crusting: no  Vomiting: no Rash: no Fatigue: yes Sick contacts: no Strep contacts: no  Context: stable Recurrent sinusitis: no Relief with OTC cold/cough medications: no Treatments attempted: none    Relevant past medical, surgical, family and social history reviewed and updated as indicated. Interim medical history since our last visit reviewed. Allergies and medications reviewed and updated.  Review of Systems  Per HPI unless specifically indicated above     Objective:    BP (!) 146/84   Pulse 71   Temp 97.7 F (36.5 C) (Oral)   Wt 163 lb 12.8 oz (74.3 kg)   SpO2 97%   BMI 29.96 kg/m   Wt Readings from Last 3 Encounters:  07/02/22 163 lb 12.8 oz (74.3 kg)  05/08/22 160 lb 6.4 oz (72.8 kg)  03/08/22 161 lb (73 kg)    Physical Exam  Results for orders placed or performed in visit on 02/06/22  Comprehensive metabolic panel  Result Value Ref Range   Glucose 95 70 - 99 mg/dL   BUN 7 (L) 8 - 27 mg/dL   Creatinine, Ser 0.34 0.57 - 1.00 mg/dL   eGFR 98 >74 QV/ZDG/3.87   BUN/Creatinine Ratio 10 (L) 12 - 28   Sodium 142 134 - 144 mmol/L   Potassium 3.8 3.5 - 5.2  mmol/L   Chloride 103 96 - 106 mmol/L   CO2 22 20 - 29 mmol/L   Calcium 9.6 8.7 - 10.3 mg/dL   Total Protein 7.1 6.0 - 8.5 g/dL   Albumin 4.2 3.9 - 4.9 g/dL   Globulin, Total 2.9 1.5 - 4.5 g/dL   Albumin/Globulin Ratio 1.4 1.2 - 2.2   Bilirubin Total 0.6 0.0 - 1.2 mg/dL   Alkaline Phosphatase 130 (H) 44 - 121 IU/L   AST 23 0 - 40 IU/L   ALT 21 0 - 32 IU/L  CBC with Differential/Platelet  Result Value Ref Range   WBC 7.0 3.4 - 10.8 x10E3/uL   RBC 5.62 (H) 3.77 - 5.28 x10E6/uL   Hemoglobin 16.7 (H) 11.1 - 15.9 g/dL   Hematocrit 56.4 (H) 33.2 - 46.6 %   MCV 88 79 - 97 fL   MCH 29.7 26.6 - 33.0 pg   MCHC 33.7 31.5 - 35.7 g/dL   RDW 95.1 88.4 - 16.6 %   Platelets 257 150 - 450 x10E3/uL   Neutrophils 56 Not Estab. %   Lymphs 32 Not Estab. %   Monocytes 7 Not Estab. %   Eos 4 Not Estab. %   Basos 1 Not Estab. %   Neutrophils Absolute 3.9 1.4 - 7.0 x10E3/uL  Lymphocytes Absolute 2.2 0.7 - 3.1 x10E3/uL   Monocytes Absolute 0.5 0.1 - 0.9 x10E3/uL   EOS (ABSOLUTE) 0.3 0.0 - 0.4 x10E3/uL   Basophils Absolute 0.1 0.0 - 0.2 x10E3/uL   Immature Granulocytes 0 Not Estab. %   Immature Grans (Abs) 0.0 0.0 - 0.1 x10E3/uL  Bayer DCA Hb A1c Waived  Result Value Ref Range   HB A1C (BAYER DCA - WAIVED) 5.9 (H) 4.8 - 5.6 %  Lipid Panel w/o Chol/HDL Ratio  Result Value Ref Range   Cholesterol, Total 206 (H) 100 - 199 mg/dL   Triglycerides 93 0 - 149 mg/dL   HDL 51 >39 mg/dL   VLDL Cholesterol Cal 17 5 - 40 mg/dL   LDL Chol Calc (NIH) 138 (H) 0 - 99 mg/dL  TSH  Result Value Ref Range   TSH 0.904 0.450 - 4.500 uIU/mL      Assessment & Plan:   Problem List Items Addressed This Visit   None Visit Diagnoses     Acute cough    -  Primary   Relevant Orders   Veritor Flu A/B Waived   Novel Coronavirus, NAA ([facility])        Follow up plan: No follow-ups on file.

## 2022-07-02 NOTE — Telephone Encounter (Signed)
Pt scheduled with Santiago Glad. No availability with Dr. Wynetta Emery

## 2022-07-03 LAB — VERITOR FLU A/B WAIVED
Influenza A: NEGATIVE
Influenza B: NEGATIVE

## 2022-07-03 LAB — NOVEL CORONAVIRUS, NAA: SARS-CoV-2, NAA: NOT DETECTED

## 2022-07-03 NOTE — Progress Notes (Signed)
Results discussed with patient during visit.

## 2022-07-04 NOTE — Progress Notes (Signed)
Hi Shaunda. Your COVID test was negative.

## 2022-07-09 ENCOUNTER — Ambulatory Visit: Payer: BC Managed Care – PPO | Admitting: Pulmonary Disease

## 2022-07-09 ENCOUNTER — Encounter: Payer: Self-pay | Admitting: Pulmonary Disease

## 2022-07-09 VITALS — BP 128/84 | HR 68 | Temp 97.6°F | Ht 62.0 in | Wt 161.6 lb

## 2022-07-09 DIAGNOSIS — J418 Mixed simple and mucopurulent chronic bronchitis: Secondary | ICD-10-CM

## 2022-07-09 DIAGNOSIS — G4733 Obstructive sleep apnea (adult) (pediatric): Secondary | ICD-10-CM | POA: Diagnosis not present

## 2022-07-09 DIAGNOSIS — F17210 Nicotine dependence, cigarettes, uncomplicated: Secondary | ICD-10-CM

## 2022-07-09 NOTE — Progress Notes (Signed)
Subjective:    Patient ID: Monica Hull, female    DOB: 03-21-1961, 62 y.o.   MRN: [id number] Patient Care Team: Dorcas Carrow, DO as PCP - General (Family Medicine) Antonieta Iba, MD as PCP - Cardiology (Cardiology) Salena Saner, MD as Consulting Physician (Pulmonary Disease)  Chief Complaint  Patient presents with   Follow-up    SOB with exertion. Cough with clear sputum. Little wheezing.    HPI This is a 62 year old female, current every day smoker currently smoking a pack a day, with 39-pack-year history of smoking. PMH significant for COPD, sleep apnea, allergic rhinitis, GERD, hypertension, carotid stenosis, diet controlled diabetes mellitus, hyperlipidemia.  I last saw the patient on 05 May 2022. She is maintained on Breztri 2 puffs twice a day.  Patient continues to have issues with chest congestion and gets short of breath with any activity.  Dyspnea however is at baseline.  She has not had to use rescue albuterol lately.  She recently had a URI after travel to [place].  Cough with production white to clear mostly in the mornings.  No hemoptysis.  No fevers, chills or sweats.  She is vaccinated for flu and RSV.  She was checked for flu, RSV and COVID-19 last week while she was in [place].  These were negative.  She has not had malaise. She has not had any chest pain, no orthopnea no paroxysmal nocturnal dyspnea.  She does not endorse any other symptomatology.   She has obstructive sleep apnea and is in the process of getting CPAP.   DATA 09/26/2021 PFTs: FEV1 1.70 L or 71% predicted, FVC 2.13 L or 68% predicted, FEV1/FVC 80%, no bronchodilator response.  Lung volumes normal, diffusion capacity normal.  Patient had difficulty during the test.  No significant obstruction. 05/23/2022 LDCT chest: Mild centrilobular emphysema. Stable small bilateral pulmonary nodules largest 5.5 mm. Lung RADS 2, benign appearance or behavior.  Review of Systems A 10 point  review of systems was performed and it is as noted above otherwise negative.  Patient Active Problem List   Diagnosis Date Noted   Aortic atherosclerosis (HCC) 04/08/2019   Syncope 03/24/2018   Allergic rhinitis 02/27/2017   Current smoker 02/20/2016   Carotid stenosis 02/16/2016   Thyroid nodule 11/01/2015   Type 2 diabetes mellitus with other specified complication (HCC) 08/18/2015   PMB (postmenopausal bleeding) 06/30/2015   Obesity (BMI 30.0-34.9) 06/30/2015   Paresthesias 02/01/2015   DJD (degenerative joint disease), cervical 02/01/2015   Stress incontinence 12/21/2014   Asthma    GERD (gastroesophageal reflux disease)    Sleep apnea    Hyperlipidemia    Hypertension    COPD (chronic obstructive pulmonary disease) (HCC)    Nicotine dependence, cigarettes, w unsp disorders    Social History   Tobacco Use   Smoking status: Every Day    Packs/day: 1.00    Years: 39.00    Total pack years: 39.00    Types: Cigarettes   Smokeless tobacco: Never   Tobacco comments:    1 PPD- 07/09/2022 khj  Substance Use Topics   Alcohol use: Never   Allergies  Allergen Reactions   Oxycodone    Advair Hfa [Fluticasone-Salmeterol] Rash    Rash and thrush   Anoro Ellipta [Umeclidinium-Vilanterol] Rash    Causes patients tongue to break out   Biaxin [Clarithromycin] Nausea And Vomiting and Rash   Current Meds  Medication Sig   albuterol (PROVENTIL) (2.5 MG/3ML) 0.083% nebulizer solution Take  3 mLs (2.5 mg total) by nebulization every 6 (six) hours as needed for wheezing or shortness of breath.   albuterol (VENTOLIN HFA) 108 (90 Base) MCG/ACT inhaler Inhale 2 puffs into the lungs every 6 (six) hours as needed for wheezing or shortness of breath.   aspirin EC 81 MG tablet Take 81 mg by mouth daily. Swallow whole.   atorvastatin (LIPITOR) 80 MG tablet Take 1 tablet (80 mg total) by mouth daily.   benzonatate (TESSALON) 200 MG capsule Take 1 capsule (200 mg total) by mouth 2 (two) times  daily as needed for cough.   Budeson-Glycopyrrol-Formoterol (BREZTRI AEROSPHERE) 160-9-4.8 MCG/ACT AERO Inhale 2 puffs into the lungs in the morning and at bedtime.   Budeson-Glycopyrrol-Formoterol (BREZTRI AEROSPHERE) 160-9-4.8 MCG/ACT AERO Inhale 2 puffs into the lungs in the morning and at bedtime.   celecoxib (CELEBREX) 200 MG capsule Take 200 mg by mouth 2 (two) times daily.   cetirizine (ZYRTEC) 10 MG tablet Take 10 mg by mouth daily as needed for allergies.   Dulaglutide (TRULICITY) 1.5 OH/6.0VP SOPN Inject 1.5 mg into the skin every Wednesday.   fesoterodine (TOVIAZ) 8 MG TB24 tablet Take 1 tablet (8 mg total) by mouth daily.   fluticasone (FLONASE) 50 MCG/ACT nasal spray Place 2 sprays into both nostrils daily as needed for allergies.   linaclotide (LINZESS) 145 MCG CAPS capsule Take 145 mcg by mouth daily before breakfast.   lisinopril (ZESTRIL) 40 MG tablet Take 1 tablet (40 mg total) by mouth daily.   metFORMIN (GLUCOPHAGE-XR) 500 MG 24 hr tablet Take 2 tablets (1,000 mg total) by mouth daily with breakfast.   pantoprazole (PROTONIX) 40 MG tablet Take 1 tablet (40 mg total) by mouth every morning.   Spacer/Aero-Holding Chambers (AEROCHAMBER MV) inhaler Use as instructed   Spacer/Aero-Holding Chambers (AEROCHAMBER MV) inhaler Use as instructed   trolamine salicylate (ASPERCREME) 10 % cream Apply 1 application  topically as needed for muscle pain.   Current Facility-Administered Medications for the 07/09/22 encounter (Office Visit) with Tyler Pita, MD  Medication   albuterol (PROVENTIL) (2.5 MG/3ML) 0.083% nebulizer solution 2.5 mg   Immunization History  Administered Date(s) Administered   COVID-19, mRNA, vaccine(Comirnaty)12 years and older 06/14/2022   Influenza,inj,Quad PF,6+ Mos 03/07/2015, 02/29/2016, 05/09/2017, 03/24/2018, 03/05/2019, 04/14/2020, 04/25/2021   Influenza-Unspecified 03/25/2014, 04/07/2022   PFIZER(Purple Top)SARS-COV-2 Vaccination 09/10/2019,  10/06/2019, 04/14/2020   Pneumococcal Polysaccharide-23 09/17/2014   Respiratory Syncytial Virus Vaccine,Recomb Aduvanted(Arexvy) 04/07/2022   Tdap 12/21/2014   Zoster Recombinat (Shingrix) 02/07/2021       Objective:   Physical Exam BP 128/84 (BP Location: Left Arm, Cuff Size: Normal)   Pulse 68   Temp 97.6 F (36.4 C)   Ht 5\' 2"  (1.575 m)   Wt 161 lb 9.6 oz (73.3 kg)   SpO2 98%   BMI 29.56 kg/m   SpO2: 98 % O2 Device: None (Room air)  GENERAL: Somewhat disheveled, overweight woman, no acute distress, fully ambulatory, smells heavily of tobacco.  No conversational dyspnea.  Looks much older than stated age, plethoric. HEAD: Normocephalic, atraumatic.  EYES: Pupils equal, round, reactive to light.  No scleral icterus.  MOUTH: Very poor dentition, missing teeth.  Oral mucosa moist.  No thrush. NECK: Supple. No thyromegaly. Trachea midline. No JVD.  No adenopathy. PULMONARY: Good air entry bilaterally.  Coarse breath sounds with scattered rhonchi and wheezes. CARDIOVASCULAR: S1 and S2. Regular rate and rhythm.  No rubs, murmurs or gallops heard. ABDOMEN: Obese otherwise benign. MUSCULOSKELETAL: No joint deformity, no clubbing,  no edema.  NEUROLOGIC: Neuro grossly normal. SKIN: Intact,warm,dry. PSYCH: Mood and behavior normal.      Assessment & Plan:     ICD-10-CM   1. Mixed simple and mucopurulent chronic bronchitis ([place])  J41.8    Patient advised to use Mucinex OTC as needed Continue Breztri 2 puffs twice a day Continue as needed albuterol    2. Obstructive sleep apnea syndrome  G47.33    Procuring CPAP Follow-up 3 months    3. Tobacco dependence due to cigarettes  F17.210    Patient counseled with regards to discontinuation of smoking Total counseling time 3 to 5 minutes     Will see the patient in follow-up in 3 months time she is to contact us prior to that time should any new problems arise.   Renold Don, MD Advanced Bronchoscopy P[facility] [place]  [facility]    *This note was dictated using voice recognition software/Dragon.  Despite best efforts to proofread, errors can occur which can change the meaning. Any transcriptional errors that result from this process are unintentional and may not be fully corrected at the time of dictation.

## 2022-07-09 NOTE — Patient Instructions (Signed)
I recommend that you use Mucinex extra strength (blue box) to help with your congestion.  Please work on quitting smoking.  Let us know when you resolve the issue with the CPAP.  We will see you in follow-up in 3 months time call sooner should any new problems arise.

## 2022-08-06 ENCOUNTER — Telehealth: Payer: Self-pay | Admitting: Family Medicine

## 2022-08-06 NOTE — Telephone Encounter (Signed)
Copied from [place]. Topic: General - Inquiry >> Aug 06, 2022  2:49 PM Leilani Able wrote: Trulicity Dulaglutide (TRULICITY) 1.5 0000000 SOPN Pt husband called and states he is already working on a pharmacy anywhere near that would have med for this month and no one has anything. Wants to know if possible tp switch to a like med for easier access or Dr Durenda Age advice as med was really working good.  [phone]

## 2022-08-09 NOTE — Telephone Encounter (Signed)
I haven't heard of a big back order on this one- we may need to call around to other pharmacies to see if they have any and I'm happy to send in 90 day supplies to make this less of a problem.

## 2022-08-10 NOTE — Telephone Encounter (Signed)
Spoke with patient's husband and notified him of Dr.Johnson's recommendations. Patient says the is no issue with the current prescription. Patient husband says they have been all over and no pharmacies have the medication in stock. Patient husband says he has been all the way to [facility] as well and nobody has it. Please advise?

## 2022-08-10 NOTE — Telephone Encounter (Signed)
Left message for patient's husband to discuss Dr.Johnson's recommendations.   OK for P[facility] to give note if patient or patient husband call back.

## 2022-08-15 ENCOUNTER — Telehealth: Payer: Self-pay | Admitting: Family Medicine

## 2022-08-15 ENCOUNTER — Encounter: Payer: BC Managed Care – PPO | Admitting: Family Medicine

## 2022-08-15 NOTE — Telephone Encounter (Signed)
Patient can discuss with provider at today's visit at 08/15/22 at 10:00 am.

## 2022-08-15 NOTE — Telephone Encounter (Signed)
I have nothing to advise. I'm sorry. Let us know where they can find it and I'll be happy to send it there. I can also send it to mail order

## 2022-08-15 NOTE — Telephone Encounter (Signed)
2/21 pts spouse came back up to the counter and  stated they had to leave the pt was not feeling good and will call back to r/s

## 2022-08-17 ENCOUNTER — Telehealth: Payer: Self-pay | Admitting: Family Medicine

## 2022-08-17 NOTE — Telephone Encounter (Signed)
Spoke with Aniceto Boss from [place] to clarified information regarding patient most recent A1c results. Aniceto Boss says she is going to fax over some paperwork for provider's review regarding patient prescription of Atorvastatin. Due to patient being on the highest dose of the medication and possibly prescribing a lower dose or changing therapy to a different statin. Once fax is received will be placed in provider's folder for review.

## 2022-08-17 NOTE — Telephone Encounter (Signed)
Copied from [place] [id number]. Topic: General - Inquiry >> Aug 17, 2022 10:26 AM Marcellus Scott wrote: Reason for CRM: Tasha from [place] is requesting a callback in regards to pt diabetes,stated she has clinical questions.  Please advise.

## 2022-08-18 ENCOUNTER — Other Ambulatory Visit: Payer: Self-pay | Admitting: Family Medicine

## 2022-08-20 ENCOUNTER — Ambulatory Visit: Payer: Self-pay

## 2022-08-20 NOTE — Telephone Encounter (Signed)
Rx dose was increased on 02/07/22 by provider.  Requested Prescriptions  Pending Prescriptions Disp Refills   TRULICITY [id number] SOPN [Pharmacy Med Name: TRULICITY 0.'75MG'$ /0.5ML SDP 0.5ML] 2 mL 2    Sig: ADMINISTER 0.75 MG UNDER THE SKIN 1 TIME A WEEK     Endocrinology:  Diabetes - GLP-1 Receptor Agonists Failed - 08/18/2022  3:34 PM      Failed - HBA1C is between 0 and 7.9 and within 180 days    Hemoglobin A1C  Date Value Ref Range Status  02/29/2016 6.7  Final   HB A1C (BAYER DCA - WAIVED)  Date Value Ref Range Status  02/06/2022 5.9 (H) 4.8 - 5.6 % Final    Comment:             Prediabetes: 5.7 - 6.4          Diabetes: >6.4          Glycemic control for adults with diabetes: <7.0          Passed - Valid encounter within last 6 months    Recent Outpatient Visits           1 month ago Viral upper respiratory infection   [place] Jon Billings, NP   5 months ago Bacterial conjunctivitis of right eye   [place], Erin E, PA-C   6 months ago Type 2 diabetes mellitus with other specified complication, without long-term current use of insulin ([place])   [place], Megan P, DO   7 months ago COPD exacerbation [facility])   [place] Jon Billings, NP   9 months ago Diet-controlled diabetes mellitus [facility])   [place], [place], DO

## 2022-08-20 NOTE — Telephone Encounter (Signed)
Chief Complaint: Shortness of breath, gums hurt, sinus stuffiness, cough Symptoms: above Frequency: 2 weeks Pertinent Negatives: Patient denies Fever Disposition: '[]'$ ED /'[]'$ Urgent Care (no appt availability in office) / '[x]'$ Appointment(In office/virtual)/ '[]'$  [facility]/ '[]'$ Home Care/ '[]'$ Refused Recommended Disposition /'[]'$  Mobile Bus/ '[]'$  Follow-up with PCP Additional Notes: Pt states that she has had sinus stuffiness, HA, cough and SOB for 2 weeks. Pt states that SOB is only a little bit worse than normal. PT has COPD, Asthma and emphysema. PT states she has a sinus infection. Mucous is clear. PT would rather be seen in office than go to an UC.  Pt has not been able to get her Trulicity. She states she has called around and no pharmacies have the medication - it is back ordered.  Please advise.   Summary: possible sinus infection   Pt husband called in stated pt has a possible sinus infection. Stated unsure of her exact symptoms at this moment. Declined to schedule with anyone other than Dr.Johnson.   Please call pt back directly / [phone]  Seeking clinical advice.     Reason for Disposition  [1] Longstanding difficulty breathing (e.g., CHF, COPD, emphysema) AND [2] WORSE than normal  Answer Assessment - Initial Assessment Questions 1. LOCATION: "Where does it hurt?"      Sinus pain and pressure, stuffiness, cough Ha and gums ache 2. ONSET: "When did the sinus pain start?"  (e.g., hours, days)      2 weeks 3. SEVERITY: "How bad is the pain?"   (Scale 1-10; mild, moderate or severe)   - MILD (1-3): doesn't interfere with normal activities    - MODERATE (4-7): interferes with normal activities (e.g., work or school) or awakens from sleep   - SEVERE (8-10): excruciating pain and patient unable to do any normal activities        5-8/10 4. RECURRENT SYMPTOM: "Have you ever had sinus problems before?" If Yes, ask: "When was the last time?" and "What happened  that time?"      yes 5. NASAL CONGESTION: "Is the nose blocked?" If Yes, ask: "Can you open it or must you breathe through your mouth?"     No - sometimes it is blocked 6. NASAL DISCHARGE: "Do you have discharge from your nose?" If so ask, "What color?"     Has not blown her nose 7. FEVER: "Do you have a fever?" If Yes, ask: "What is it, how was it measured, and when did it start?"      no 8. OTHER SYMPTOMS: "Do you have any other symptoms?" (e.g., sore throat, cough, earache, difficulty breathing)     Runny nose and stuffiness, cough, gums hurt  Answer Assessment - Initial Assessment Questions 1. RESPIRATORY STATUS: "Describe your breathing?" (e.g., wheezing, shortness of breath, unable to speak, severe coughing)      Shortness 2. ONSET: "When did this breathing problem begin?"      Ongoing 3. PATTERN "Does the difficult breathing come and go, or has it been constant since it started?"       4. SEVERITY: "How bad is your breathing?" (e.g., mild, moderate, severe)    - MILD: No SOB at rest, mild SOB with walking, speaks normally in sentences, can lie down, no retractions, pulse < 100.    - MODERATE: SOB at rest, SOB with minimal exertion and prefers to sit, cannot lie down flat, speaks in phrases, mild retractions, audible wheezing, pulse 100-120.    - SEVERE: Very SOB at rest,  speaks in single words, struggling to breathe, sitting hunched forward, retractions, pulse > 120      mild 5. RECURRENT SYMPTOM: "Have you had difficulty breathing before?" If Yes, ask: "When was the last time?" and "What happened that time?"      Yes ongoing 6. CARDIAC HISTORY: "Do you have any history of heart disease?" (e.g., heart attack, angina, bypass surgery, angioplasty)      Blockages in neck 7. LUNG HISTORY: "Do you have any history of lung disease?"  (e.g., pulmonary embolus, asthma, emphysema)     COPD, asthma, Inf 8. CAUSE: "What do you think is causing the breathing problem?"      URI 9. OTHER  SYMPTOMS: "Do you have any other symptoms? (e.g., dizziness, runny nose, cough, chest pain, fever)     Runny nose, HA  Protocols used: Sinus Pain or Congestion-A-AH, Breathing Difficulty-A-AH

## 2022-08-20 NOTE — Telephone Encounter (Signed)
Patient was scheduled with a different provider.

## 2022-08-20 NOTE — Telephone Encounter (Signed)
Pt husband stated medication Dulaglutide (TRULICITY) 1.5 0000000 SOPN is backordered; cannot find it anywhere. Pt husband is requesting .75 be sent in as this is in stock and/or is asking for an alternative. Mentioned at one point that the office had samples is asking if any samples were available.    Seeking clinical advice.

## 2022-08-21 ENCOUNTER — Ambulatory Visit
Admission: RE | Admit: 2022-08-21 | Discharge: 2022-08-21 | Disposition: A | Discharge status: Home / Self Care | Payer: BC Managed Care – PPO | Source: Home / Self Care | Attending: Nurse Practitioner | Admitting: Nurse Practitioner

## 2022-08-21 ENCOUNTER — Ambulatory Visit
Admission: RE | Admit: 2022-08-21 | Discharge: 2022-08-21 | Disposition: A | Discharge status: Home / Self Care | Payer: BC Managed Care – PPO | Source: Ambulatory Visit | Attending: Nurse Practitioner | Admitting: Nurse Practitioner

## 2022-08-21 ENCOUNTER — Ambulatory Visit (INDEPENDENT_AMBULATORY_CARE_PROVIDER_SITE_OTHER): Payer: BC Managed Care – PPO | Admitting: Nurse Practitioner

## 2022-08-21 ENCOUNTER — Encounter: Payer: Self-pay | Admitting: Nurse Practitioner

## 2022-08-21 VITALS — BP 133/79 | HR 62 | Temp 97.8°F | Wt 161.7 lb

## 2022-08-21 DIAGNOSIS — R062 Wheezing: Secondary | ICD-10-CM | POA: Insufficient documentation

## 2022-08-21 DIAGNOSIS — J441 Chronic obstructive pulmonary disease with (acute) exacerbation: Secondary | ICD-10-CM | POA: Diagnosis not present

## 2022-08-21 DIAGNOSIS — R059 Cough, unspecified: Secondary | ICD-10-CM | POA: Diagnosis not present

## 2022-08-21 MED ORDER — PREDNISONE 10 MG PO TABS: 10.0000 mg | ORAL_TABLET | Freq: Every day | ORAL | 0 refills | Status: DC

## 2022-08-21 MED ORDER — AMOXICILLIN 500 MG PO CAPS: 500.0000 mg | ORAL_CAPSULE | Freq: Two times a day (BID) | ORAL | 0 refills | Status: AC

## 2022-08-21 NOTE — Progress Notes (Signed)
BP 133/79 (BP Location: Right Arm, Cuff Size: Normal)   Pulse 62   Temp 97.8 F (36.6 C) (Oral)   Wt 161 lb 11.2 oz (73.3 kg)   SpO2 98%   BMI 29.58 kg/m    Subjective:    Patient ID: Monica Hull, female    DOB: 1960-10-15, 62 y.o.   MRN: [id number]  HPI: Monica Hull is a 62 y.o. female  Chief Complaint  Patient presents with   URI    Pt states she has been having congestion, headache, sinus pressure, and cough for the last month.    UPPER RESPIRATORY TRACT INFECTION Worst symptom: Symptoms have been ongoing x 1 month. Fever: no Cough: yes Shortness of breath: yes Wheezing: yes Chest pain: no Chest tightness: yes Chest congestion: yes Nasal congestion: yes Runny nose: yes Post nasal drip: no Sneezing:  some Sore throat: no Swollen glands: no Sinus pressure: no Headache: yes Face pain: no Toothache: yes Ear pain: no bilateral Ear pressure: no bilateral Eyes red/itching:no Eye drainage/crusting: yes Vomiting: no Rash: no Fatigue: yes Sick contacts: no Strep contacts: no  Context: worse Recurrent sinusitis: no Relief with OTC cold/cough medications: yes  Treatments attempted: mucinex   Relevant past medical, surgical, family and social history reviewed and updated as indicated. Interim medical history since our last visit reviewed. Allergies and medications reviewed and updated.  Review of Systems  Constitutional:  Positive for fatigue. Negative for fever.  HENT:  Positive for congestion, rhinorrhea and sneezing. Negative for dental problem, ear pain, postnasal drip, sinus pressure, sinus pain and sore throat.   Respiratory:  Positive for cough, shortness of breath and wheezing.   Cardiovascular:  Negative for chest pain.  Gastrointestinal:  Negative for vomiting.  Skin:  Negative for rash.  Neurological:  Positive for headaches.    Per HPI unless specifically indicated above     Objective:    BP 133/79 (BP Location: Right  Arm, Cuff Size: Normal)   Pulse 62   Temp 97.8 F (36.6 C) (Oral)   Wt 161 lb 11.2 oz (73.3 kg)   SpO2 98%   BMI 29.58 kg/m   Wt Readings from Last 3 Encounters:  08/21/22 161 lb 11.2 oz (73.3 kg)  07/09/22 161 lb 9.6 oz (73.3 kg)  07/02/22 163 lb 12.8 oz (74.3 kg)    Physical Exam Vitals and nursing note reviewed.  Constitutional:      General: She is not in acute distress.    Appearance: Normal appearance. She is normal weight. She is not ill-appearing, toxic-appearing or diaphoretic.  HENT:     Head: Normocephalic.     Right Ear: Tympanic membrane and external ear normal.     Left Ear: Tympanic membrane and external ear normal.     Nose: Congestion and rhinorrhea present.     Mouth/Throat:     Mouth: Mucous membranes are moist.     Pharynx: Oropharynx is clear. Posterior oropharyngeal erythema present. No oropharyngeal exudate.  Eyes:     General:        Right eye: No discharge.        Left eye: No discharge.     Extraocular Movements: Extraocular movements intact.     Conjunctiva/sclera: Conjunctivae normal.     Pupils: Pupils are equal, round, and reactive to light.  Cardiovascular:     Rate and Rhythm: Normal rate and regular rhythm.     Heart sounds: No murmur heard. Pulmonary:     Effort: Pulmonary  effort is normal. No respiratory distress.     Breath sounds: Wheezing and rhonchi present. No rales.  Musculoskeletal:     Cervical back: Normal range of motion and neck supple.  Skin:    General: Skin is warm and dry.     Capillary Refill: Capillary refill takes less than 2 seconds.  Neurological:     General: No focal deficit present.     Mental Status: She is alert and oriented to person, place, and time. Mental status is at baseline.  Psychiatric:        Mood and Affect: Mood normal.        Behavior: Behavior normal.        Thought Content: Thought content normal.        Judgment: Judgment normal.     Results for orders placed or performed in visit on  07/02/22  Novel Coronavirus, NAA ([facility])   Specimen: Nasopharyngeal(NP) swabs in vial transport medium  Result Value Ref Range   SARS-CoV-2, NAA Not Detected Not Detected  Veritor Flu A/B Waived  Result Value Ref Range   Influenza A Negative Negative   Influenza B Negative Negative      Assessment & Plan:   Problem List Items Addressed This Visit   None Visit Diagnoses     COPD exacerbation ([place])    -  Primary   Ongoing x 1 month. Will obtain chest xray. Will treat with amoxicillin and prednisone taper.  Follow up in 1 week for lung recheck.   Relevant Medications   predniSONE (DELTASONE) 10 MG tablet   Wheezing       Relevant Orders   DG Chest 2 View        Follow up plan: Return in about 1 week (around 08/28/2022) for Lung check.   A total of 30 minutes were spent on this encounter today.  When total time is documented, this includes both the face-to-face and non-face-to-face time personally spent before, during and after the visit on the date of the encounter symptoms, plan of care and follow up.

## 2022-08-23 NOTE — Progress Notes (Signed)
Please let patient know that her chest xray looks good.  Continue with plan as discussed during visit.

## 2022-09-03 ENCOUNTER — Encounter: Payer: Self-pay | Admitting: Nurse Practitioner

## 2022-09-03 ENCOUNTER — Ambulatory Visit (INDEPENDENT_AMBULATORY_CARE_PROVIDER_SITE_OTHER): Payer: BC Managed Care – PPO | Admitting: Nurse Practitioner

## 2022-09-03 VITALS — BP 136/71 | HR 63 | Temp 98.1°F | Wt 163.0 lb

## 2022-09-03 DIAGNOSIS — J418 Mixed simple and mucopurulent chronic bronchitis: Secondary | ICD-10-CM

## 2022-09-03 NOTE — Progress Notes (Signed)
BP 136/71   Pulse 63   Temp 98.1 F (36.7 C) (Oral)   Wt 163 lb (73.9 kg)   SpO2 96%   BMI 29.81 kg/m    Subjective:    Patient ID: Monica Hull, female    DOB: [birth year], 62 y.o.   MRN: [id number]  HPI: Monica Hull is a 62 y.o. female  Chief Complaint  Patient presents with   COPD    2 week f/up lung recheck    UPPER RESPIRATORY TRACT INFECTION Worst symptom: Patient states she is feeling better.  When she would bend down on Friday she would get dizzy.     Relevant past medical, surgical, family and social history reviewed and updated as indicated. Interim medical history since our last visit reviewed. Allergies and medications reviewed and updated.  Review of Systems  Respiratory:  Positive for cough, shortness of breath and wheezing.     Per HPI unless specifically indicated above     Objective:    BP 136/71   Pulse 63   Temp 98.1 F (36.7 C) (Oral)   Wt 163 lb (73.9 kg)   SpO2 96%   BMI 29.81 kg/m   Wt Readings from Last 3 Encounters:  09/03/22 163 lb (73.9 kg)  08/21/22 161 lb 11.2 oz (73.3 kg)  07/09/22 161 lb 9.6 oz (73.3 kg)    Physical Exam Vitals and nursing note reviewed.  Constitutional:      General: She is not in acute distress.    Appearance: Normal appearance. She is normal weight. She is not ill-appearing, toxic-appearing or diaphoretic.  HENT:     Head: Normocephalic.     Right Ear: Tympanic membrane and external ear normal.     Left Ear: Tympanic membrane and external ear normal.     Nose: No congestion or rhinorrhea.     Mouth/Throat:     Mouth: Mucous membranes are moist.     Pharynx: Oropharynx is clear. No oropharyngeal exudate or posterior oropharyngeal erythema.  Eyes:     General:        Right eye: No discharge.        Left eye: No discharge.     Extraocular Movements: Extraocular movements intact.     Conjunctiva/sclera: Conjunctivae normal.     Pupils: Pupils are equal, round, and reactive to  light.  Cardiovascular:     Rate and Rhythm: Normal rate and regular rhythm.     Heart sounds: No murmur heard. Pulmonary:     Effort: Pulmonary effort is normal. No respiratory distress.     Breath sounds: Wheezing and rhonchi present. No rales.  Musculoskeletal:     Cervical back: Normal range of motion and neck supple.  Skin:    General: Skin is warm and dry.     Capillary Refill: Capillary refill takes less than 2 seconds.  Neurological:     General: No focal deficit present.     Mental Status: She is alert and oriented to person, place, and time. Mental status is at baseline.  Psychiatric:        Mood and Affect: Mood normal.        Behavior: Behavior normal.        Thought Content: Thought content normal.        Judgment: Judgment normal.     Results for orders placed or performed in visit on 07/02/22  Novel Coronavirus, NAA ([facility])   Specimen: Nasopharyngeal(NP) swabs in vial transport medium  Result Value  Ref Range   SARS-CoV-2, NAA Not Detected Not Detected  Veritor Flu A/B Waived  Result Value Ref Range   Influenza A Negative Negative   Influenza B Negative Negative      Assessment & Plan:   Problem List Items Addressed This Visit       Respiratory   COPD (chronic obstructive pulmonary disease) ([place]) - Primary    Chronic.  Improved.  Still has wheezing and ronchi but symptoms are improving.  Moving air in her lungs today.  Complete course of medications.  Follow up if symptoms worsen or fail to improve.        Follow up plan: Return if symptoms worsen or fail to improve.

## 2022-09-03 NOTE — Assessment & Plan Note (Signed)
Chronic.  Improved.  Still has wheezing and ronchi but symptoms are improving.  Moving air in her lungs today.  Complete course of medications.  Follow up if symptoms worsen or fail to improve.

## 2022-09-10 ENCOUNTER — Encounter: Payer: Self-pay | Admitting: Pulmonary Disease

## 2022-09-10 ENCOUNTER — Ambulatory Visit (INDEPENDENT_AMBULATORY_CARE_PROVIDER_SITE_OTHER): Payer: BC Managed Care – PPO | Admitting: Family Medicine

## 2022-09-10 ENCOUNTER — Encounter: Payer: Self-pay | Admitting: Family Medicine

## 2022-09-10 VITALS — BP 128/83 | HR 74 | Temp 97.9°F | Ht 62.25 in | Wt 163.1 lb

## 2022-09-10 DIAGNOSIS — E1169 Type 2 diabetes mellitus with other specified complication: Secondary | ICD-10-CM | POA: Diagnosis not present

## 2022-09-10 DIAGNOSIS — K219 Gastro-esophageal reflux disease without esophagitis: Secondary | ICD-10-CM

## 2022-09-10 DIAGNOSIS — Z23 Encounter for immunization: Secondary | ICD-10-CM | POA: Diagnosis not present

## 2022-09-10 DIAGNOSIS — Z0000 Encounter for general adult medical examination without abnormal findings: Secondary | ICD-10-CM

## 2022-09-10 DIAGNOSIS — I10 Essential (primary) hypertension: Secondary | ICD-10-CM

## 2022-09-10 DIAGNOSIS — F17200 Nicotine dependence, unspecified, uncomplicated: Secondary | ICD-10-CM

## 2022-09-10 DIAGNOSIS — E782 Mixed hyperlipidemia: Secondary | ICD-10-CM

## 2022-09-10 DIAGNOSIS — Z136 Encounter for screening for cardiovascular disorders: Secondary | ICD-10-CM | POA: Diagnosis not present

## 2022-09-10 DIAGNOSIS — Z1231 Encounter for screening mammogram for malignant neoplasm of breast: Secondary | ICD-10-CM

## 2022-09-10 LAB — URINALYSIS, ROUTINE W REFLEX MICROSCOPIC
Bilirubin, UA: NEGATIVE
Glucose, UA: NEGATIVE
Ketones, UA: NEGATIVE
Leukocytes,UA: NEGATIVE
Nitrite, UA: NEGATIVE
Protein,UA: NEGATIVE
RBC, UA: NEGATIVE
Specific Gravity, UA: 1.015 (ref 1.005–1.030)
Urobilinogen, Ur: 1 mg/dL (ref 0.2–1.0)
pH, UA: 7 (ref 5.0–7.5)

## 2022-09-10 LAB — MICROALBUMIN, URINE WAIVED
Creatinine, Urine Waived: 50 mg/dL (ref 10–300)
Microalb, Ur Waived: 10 mg/L (ref 0–19)

## 2022-09-10 LAB — BAYER DCA HB A1C WAIVED: HB A1C (BAYER DCA - WAIVED): 6.5 % — ABNORMAL HIGH (ref 4.8–5.6)

## 2022-09-10 MED ORDER — METFORMIN HCL ER 500 MG PO TB24: 1000.0000 mg | ORAL_TABLET | Freq: Every day | ORAL | 1 refills | Status: DC

## 2022-09-10 MED ORDER — ATORVASTATIN CALCIUM 80 MG PO TABS: 80.0000 mg | ORAL_TABLET | Freq: Every day | ORAL | 1 refills | Status: DC

## 2022-09-10 MED ORDER — LISINOPRIL 40 MG PO TABS: 40.0000 mg | ORAL_TABLET | Freq: Every day | ORAL | 1 refills | Status: DC

## 2022-09-10 MED ORDER — ALBUTEROL SULFATE HFA 108 (90 BASE) MCG/ACT IN AERS: 2.0000 | INHALATION_SPRAY | Freq: Four times a day (QID) | RESPIRATORY_TRACT | 6 refills | Status: DC | PRN

## 2022-09-10 MED ORDER — FLUTICASONE PROPIONATE 50 MCG/ACT NA SUSP: 2.0000 | Freq: Every day | NASAL | 12 refills | Status: DC | PRN

## 2022-09-10 MED ORDER — ALBUTEROL SULFATE (2.5 MG/3ML) 0.083% IN NEBU: 2.5000 mg | INHALATION_SOLUTION | Freq: Four times a day (QID) | RESPIRATORY_TRACT | 1 refills | Status: DC | PRN

## 2022-09-10 MED ORDER — BENZONATATE 200 MG PO CAPS: 200.0000 mg | ORAL_CAPSULE | Freq: Two times a day (BID) | ORAL | 0 refills | Status: DC | PRN

## 2022-09-10 MED ORDER — BREZTRI AEROSPHERE 160-9-4.8 MCG/ACT IN AERO: 2.0000 | INHALATION_SPRAY | Freq: Two times a day (BID) | RESPIRATORY_TRACT | 3 refills | Status: DC

## 2022-09-10 MED ORDER — OZEMPIC (0.25 OR 0.5 MG/DOSE) 2 MG/3ML ~~LOC~~ SOPN: 0.5000 mg | PEN_INJECTOR | SUBCUTANEOUS | 1 refills | Status: DC

## 2022-09-10 MED ORDER — FESOTERODINE FUMARATE ER 8 MG PO TB24: 8.0000 mg | ORAL_TABLET | Freq: Every day | ORAL | 3 refills | Status: DC

## 2022-09-10 MED ORDER — CETIRIZINE HCL 10 MG PO TABS: 10.0000 mg | ORAL_TABLET | Freq: Every day | ORAL | 3 refills | Status: DC | PRN

## 2022-09-10 NOTE — Assessment & Plan Note (Signed)
Up slightly with A1c of 6.5. Cannot get trulicity due to shortage. Will start her on ozempic. Recheck 3 months. Call with any concerns.

## 2022-09-10 NOTE — Progress Notes (Signed)
BP 128/83   Pulse 74   Temp 97.9 F (36.6 C) (Oral)   Ht 5' 2.25" (1.581 m)   Wt 163 lb 1.6 oz (74 kg)   SpO2 99%   BMI 29.59 kg/m    Subjective:    Patient ID: Monica Hull, female    DOB: Oct 28, 1960, 62 y.o.   MRN: [id number]  HPI: Monica Hull is a 62 y.o. female presenting on 09/10/2022 for comprehensive medical examination. Current medical complaints include:  DIABETES Hypoglycemic episodes:no Polydipsia/polyuria: yes Visual disturbance: no Chest pain: no Paresthesias: yes in her fingers and feet Glucose Monitoring: no Taking Insulin?: no Blood Pressure Monitoring: not checking Retinal Examination: Up to Date Foot Exam: Up to Date Diabetic Education: Completed Pneumovax: Up to Date Influenza: Up to Date Aspirin: yes  HYPERTENSION / HYPERLIPIDEMIA Satisfied with current treatment? no Duration of hypertension: chronic BP monitoring frequency: not checking BP medication side effects: no Past BP meds: lisinopril Duration of hyperlipidemia: chronic Cholesterol medication side effects: no Cholesterol supplements: none Past cholesterol medications: atorvastatin Medication compliance: excellent compliance Aspirin: yes Recent stressors: no Recurrent headaches: no Visual changes: no Palpitations: yes Dyspnea: yes Chest pain: no Lower extremity edema: no Dizzy/lightheaded: no  COPD COPD status: stable Satisfied with current treatment?: yes Oxygen use: no Dyspnea frequency: often Cough frequency: often Rescue inhaler frequency: often   Limitation of activity: yes Productive cough: yes Pneumovax: Up to Date Influenza: Up to Date  She currently lives with: husband and kids Menopausal Symptoms: no  Depression Screen done today and results listed below:     09/10/2022    3:06 PM 02/06/2022    9:57 AM 01/05/2022    4:06 PM 11/06/2021   10:48 AM 08/14/2021    8:16 AM  Depression screen PHQ 2/9  Decreased Interest 0 0 0 0 1  Down,  Depressed, Hopeless 0 0 0 0 1  PHQ - 2 Score 0 0 0 0 2  Altered sleeping 3 3 1  0 3  Tired, decreased energy 3 3 1  0 3  Change in appetite 0 2 1 0 1  Feeling bad or failure about yourself  0 0 0 0 0  Trouble concentrating 1 0 0 0 0  Moving slowly or fidgety/restless 0 0 0 0 0  Suicidal thoughts 0 0 0 0 0  PHQ-9 Score 7 8 3  0 9  Difficult doing work/chores Not difficult at all  Not difficult at all Not difficult at all     Past Medical History:  Past Medical History:  Diagnosis Date   Arthritis    Asthma    Carpal tunnel syndrome    Chronic kidney disease    H/O KIDNEY STONES   COPD (chronic obstructive pulmonary disease) ([place])    Diabetes mellitus without complication (HCC)    DIET CONTROLLED   Elevated liver function tests    GERD (gastroesophageal reflux disease)    Headache    MIGRAINES   History of kidney stones    Hyperlipidemia    Hypertension    Impaired fasting glucose    Pneumonia    Sleep apnea    Not using CPAP- referred to sleep center   Tobacco abuse     Surgical History:  Past Surgical History:  Procedure Laterality Date   CARPAL TUNNEL RELEASE     CESAREAN SECTION     X2   DILATION AND CURETTAGE OF UTERUS     HYSTEROSCOPY WITH D & C N/A 07/11/2015   Procedure:  DILATATION AND CURETTAGE /HYSTEROSCOPY;  Surgeon: Rubie Maid, MD;  Location: [facility];  Service: Gynecology;  Laterality: N/A;   PALATE / UVULA BIOPSY / EXCISION     REVERSE SHOULDER ARTHROPLASTY Right 10/12/2021   Procedure: REVERSE TOTAL SHOULDER ARTHROPLASTY WITH BICEPS TENODESIS.;  Surgeon: Corky Mull, MD;  Location: [facility];  Service: Orthopedics;  Laterality: Right;    Medications:  Current Outpatient Medications on File Prior to Visit  Medication Sig   aspirin EC 81 MG tablet Take 81 mg by mouth daily. Swallow whole.   celecoxib (CELEBREX) 200 MG capsule Take 200 mg by mouth 2 (two) times daily.   linaclotide (LINZESS) 145 MCG CAPS capsule Take 145 mcg by mouth daily before  breakfast.   pantoprazole (PROTONIX) 40 MG tablet Take 1 tablet (40 mg total) by mouth every morning.   Spacer/Aero-Holding Chambers (AEROCHAMBER MV) inhaler Use as instructed   trolamine salicylate (ASPERCREME) 10 % cream Apply 1 application  topically as needed for muscle pain.   Current Facility-Administered Medications on File Prior to Visit  Medication   albuterol (PROVENTIL) (2.5 MG/3ML) 0.083% nebulizer solution 2.5 mg    Allergies:  Allergies  Allergen Reactions   Oxycodone    Advair Hfa [Fluticasone-Salmeterol] Rash    Rash and thrush   Anoro Ellipta [Umeclidinium-Vilanterol] Rash    Causes patients tongue to break out   Biaxin [Clarithromycin] Nausea And Vomiting and Rash    Social History:  Social History   Socioeconomic History   Marital status: Married    Spouse name: Elenore Rota   Number of children: 3   Years of education: Not on file   Highest education level: Not on file  Occupational History   Not on file  Tobacco Use   Smoking status: Every Day    Packs/day: 1.00    Years: 39.00    Additional pack years: 0.00    Total pack years: 39.00    Types: Cigarettes   Smokeless tobacco: Never   Tobacco comments:    1 PPD- 07/09/2022 khj  Vaping Use   Vaping Use: Never used  Substance and Sexual Activity   Alcohol use: Never   Drug use: No   Sexual activity: Not Currently    Partners: Male  Other Topics Concern   Not on file  Social History Narrative   Not on file   Social Determinants of Health   Financial Resource Strain: Not on file  Food Insecurity: Not on file  Transportation Needs: Not on file  Physical Activity: Not on file  Stress: Not on file  Social Connections: Not on file  Intimate Partner Violence: Not on file   Social History   Tobacco Use  Smoking Status Every Day   Packs/day: 1.00   Years: 39.00   Additional pack years: 0.00   Total pack years: 39.00   Types: Cigarettes  Smokeless Tobacco Never  Tobacco Comments   1 PPD-  07/09/2022 khj   Social History   Substance and Sexual Activity  Alcohol Use Never    Family History:  Family History  Problem Relation Age of Onset   Arthritis Mother    Asthma Mother    Diabetes Mother    Heart disease Mother    Hyperlipidemia Mother    Hypertension Mother    Kidney disease Mother    Lung disease Mother    Pancreatic cancer Mother    Alcohol abuse Father    Hypertension Father    Hyperlipidemia Father    Diabetes  Brother    Breast cancer Maternal Aunt 58   Breast cancer Maternal Aunt 48    Past medical history, surgical history, medications, allergies, family history and social history reviewed with patient today and changes made to appropriate areas of the chart.   Review of Systems  Constitutional:  Positive for fever. Negative for chills, diaphoresis, malaise/fatigue and weight loss.  HENT:  Positive for congestion and ear pain (R ear pain). Negative for ear discharge, hearing loss, nosebleeds, sinus pain, sore throat and tinnitus.   Eyes: Negative.   Respiratory:  Positive for cough and shortness of breath. Negative for hemoptysis, sputum production, wheezing and stridor.   Cardiovascular:  Positive for palpitations. Negative for chest pain, orthopnea, claudication, leg swelling and PND.  Gastrointestinal:  Positive for abdominal pain, blood in stool and heartburn. Negative for constipation, diarrhea, melena, nausea and vomiting.  Genitourinary: Negative.   Musculoskeletal: Negative.   Skin: Negative.   Neurological:  Positive for dizziness and tingling. Negative for tremors, sensory change, speech change, focal weakness, seizures, loss of consciousness, weakness and headaches.  Endo/Heme/Allergies:  Positive for environmental allergies. Negative for polydipsia. Does not bruise/bleed easily.  Psychiatric/Behavioral: Negative.     All other ROS negative except what is listed above and in the HPI.      Objective:    BP 128/83   Pulse 74   Temp  97.9 F (36.6 C) (Oral)   Ht 5' 2.25" (1.581 m)   Wt 163 lb 1.6 oz (74 kg)   SpO2 99%   BMI 29.59 kg/m   Wt Readings from Last 3 Encounters:  09/10/22 163 lb 1.6 oz (74 kg)  09/03/22 163 lb (73.9 kg)  08/21/22 161 lb 11.2 oz (73.3 kg)    Physical Exam Vitals and nursing note reviewed.  Constitutional:      General: She is not in acute distress.    Appearance: Normal appearance. She is obese. She is not ill-appearing, toxic-appearing or diaphoretic.  HENT:     Head: Normocephalic and atraumatic.     Right Ear: Tympanic membrane, ear canal and external ear normal. There is no impacted cerumen.     Left Ear: Tympanic membrane, ear canal and external ear normal. There is no impacted cerumen.     Nose: Nose normal. No congestion or rhinorrhea.     Mouth/Throat:     Mouth: Mucous membranes are moist.     Pharynx: Oropharynx is clear. No oropharyngeal exudate or posterior oropharyngeal erythema.  Eyes:     General: No scleral icterus.       Right eye: No discharge.        Left eye: No discharge.     Extraocular Movements: Extraocular movements intact.     Conjunctiva/sclera: Conjunctivae normal.     Pupils: Pupils are equal, round, and reactive to light.  Neck:     Vascular: No carotid bruit.  Cardiovascular:     Rate and Rhythm: Normal rate and regular rhythm.     Pulses: Normal pulses.     Heart sounds: No murmur heard.    No friction rub. No gallop.  Pulmonary:     Effort: Pulmonary effort is normal. No respiratory distress.     Breath sounds: Normal breath sounds. No stridor. No wheezing, rhonchi or rales.  Chest:     Chest wall: No tenderness.  Abdominal:     General: Abdomen is flat. Bowel sounds are normal. There is no distension.     Palpations: Abdomen is soft. There  is no mass.     Tenderness: There is no abdominal tenderness. There is no right CVA tenderness, left CVA tenderness, guarding or rebound.     Hernia: No hernia is present.  Genitourinary:    Comments:  Breast and pelvic exams deferred with shared decision making Musculoskeletal:        General: No swelling, tenderness, deformity or signs of injury.     Cervical back: Normal range of motion and neck supple. No rigidity. No muscular tenderness.     Right lower leg: No edema.     Left lower leg: No edema.  Lymphadenopathy:     Cervical: No cervical adenopathy.  Skin:    General: Skin is warm and dry.     Capillary Refill: Capillary refill takes less than 2 seconds.     Coloration: Skin is not jaundiced or pale.     Findings: No bruising, erythema, lesion or rash.  Neurological:     General: No focal deficit present.     Mental Status: She is alert and oriented to person, place, and time. Mental status is at baseline.     Cranial Nerves: No cranial nerve deficit.     Sensory: No sensory deficit.     Motor: No weakness.     Coordination: Coordination normal.     Gait: Gait normal.     Deep Tendon Reflexes: Reflexes normal.  Psychiatric:        Mood and Affect: Mood normal.        Behavior: Behavior normal.        Thought Content: Thought content normal.        Judgment: Judgment normal.     Results for orders placed or performed in visit on 09/10/22  Bayer DCA Hb A1c Waived  Result Value Ref Range   HB A1C (BAYER DCA - WAIVED) 6.5 (H) 4.8 - 5.6 %  Microalbumin, Urine Waived  Result Value Ref Range   Microalb, Ur Waived 10 0 - 19 mg/L   Creatinine, Urine Waived 50 10 - 300 mg/dL   Microalb/Creat Ratio 30-300 (H) <30 mg/g  Urinalysis, Routine w reflex microscopic  Result Value Ref Range   Specific Gravity, UA 1.015 1.005 - 1.030   pH, UA 7.0 5.0 - 7.5   Color, UA Yellow Yellow   Appearance Ur Clear Clear   Leukocytes,UA Negative Negative   Protein,UA Negative Negative/Trace   Glucose, UA Negative Negative   Ketones, UA Negative Negative   RBC, UA Negative Negative   Bilirubin, UA Negative Negative   Urobilinogen, Ur 1.0 0.2 - 1.0 mg/dL   Nitrite, UA Negative Negative    Microscopic Examination Comment       Assessment & Plan:   Problem List Items Addressed This Visit       Cardiovascular and Mediastinum   Hypertension    Under good control on current regimen. Continue current regimen. Continue to monitor. Call with any concerns. Refills given. Labs drawn today.        Relevant Medications   atorvastatin (LIPITOR) 80 MG tablet   lisinopril (ZESTRIL) 40 MG tablet   Other Relevant Orders   Microalbumin, Urine Waived (Completed)   CBC with Differential/Platelet   Comprehensive metabolic panel   TSH     Digestive   GERD (gastroesophageal reflux disease)    Under good control on current regimen. Continue current regimen. Continue to monitor. Call with any concerns. Refills given. Labs drawn today.       Relevant Orders  CBC with Differential/Platelet   Comprehensive metabolic panel     Endocrine   Type 2 diabetes mellitus with other specified complication (HCC)    Up slightly with A1c of 6.5. Cannot get trulicity due to shortage. Will start her on ozempic. Recheck 3 months. Call with any concerns.       Relevant Medications   Semaglutide,0.25 or 0.5MG /DOS, (OZEMPIC, 0.25 OR 0.5 MG/DOSE,) 2 MG/3ML SOPN   atorvastatin (LIPITOR) 80 MG tablet   lisinopril (ZESTRIL) 40 MG tablet   metFORMIN (GLUCOPHAGE-XR) 500 MG 24 hr tablet   Other Relevant Orders   Bayer DCA Hb A1c Waived (Completed)   Microalbumin, Urine Waived (Completed)   CBC with Differential/Platelet   Comprehensive metabolic panel   Urinalysis, Routine w reflex microscopic (Completed)     Other   Hyperlipidemia    Under good control on current regimen. Continue current regimen. Continue to monitor. Call with any concerns. Refills given. Labs drawn today.       Relevant Medications   atorvastatin (LIPITOR) 80 MG tablet   lisinopril (ZESTRIL) 40 MG tablet   Other Relevant Orders   CBC with Differential/Platelet   Comprehensive metabolic panel   Lipid Panel w/o Chol/HDL  Ratio   Current smoker    Not interested in smoking. Labs drawn today. Call with any concerns.       Relevant Orders   CBC with Differential/Platelet   Comprehensive metabolic panel   Other Visit Diagnoses     Routine general medical examination at a health care facility    -  Primary   Vaccines up to date. Screening labs checked today. Pap up to date. Mammogram ordered. Colonoscopy scheduled. Continue to monitor. Call with any concerns.   Relevant Orders   CBC with Differential/Platelet   Comprehensive metabolic panel   Lipid Panel w/o Chol/HDL Ratio   TSH   Encounter for screening mammogram for malignant neoplasm of breast       Mammogram ordered today.   Relevant Orders   MM 3D SCREENING MAMMOGRAM BILATERAL BREAST        Follow up plan: Return in about 3 months (around 12/11/2022).   LABORATORY TESTING:  - Pap smear: up to date  IMMUNIZATIONS:   - Tdap: Tetanus vaccination status reviewed: last tetanus booster within 10 years. - Influenza: Up to date - Pneumovax: Up to date - Prevnar: Not applicable - COVID: Not applicable - HPV: Not applicable - Shingrix vaccine: Administered today  SCREENING: -Mammogram: Ordered today  - Colonoscopy:  Scheduled     PATIENT COUNSELING:   Advised to take 1 mg of folate supplement per day if capable of pregnancy.   Sexuality: Discussed sexually transmitted diseases, partner selection, use of condoms, avoidance of unintended pregnancy  and contraceptive alternatives.   Advised to avoid cigarette smoking.  I discussed with the patient that most people either abstain from alcohol or drink within safe limits (<=14/week and <=4 drinks/occasion for males, <=7/weeks and <= 3 drinks/occasion for females) and that the risk for alcohol disorders and other health effects rises proportionally with the number of drinks per week and how often a drinker exceeds daily limits.  Discussed cessation/primary prevention of drug use and availability  of treatment for abuse.   Diet: Encouraged to adjust caloric intake to maintain  or achieve ideal body weight, to reduce intake of dietary saturated fat and total fat, to limit sodium intake by avoiding high sodium foods and not adding table salt, and to maintain adequate dietary potassium and  calcium preferably from fresh fruits, vegetables, and low-fat dairy products.    stressed the importance of regular exercise  Injury prevention: Discussed safety belts, safety helmets, smoke detector, smoking near bedding or upholstery.   Dental health: Discussed importance of regular tooth brushing, flossing, and dental visits.    NEXT PREVENTATIVE PHYSICAL DUE IN 1 YEAR. Return in about 3 months (around 12/11/2022).

## 2022-09-10 NOTE — Assessment & Plan Note (Signed)
Not interested in smoking. Labs drawn today. Call with any concerns.

## 2022-09-10 NOTE — Assessment & Plan Note (Signed)
Under good control on current regimen. Continue current regimen. Continue to monitor. Call with any concerns. Refills given. Labs drawn today.   

## 2022-09-11 LAB — CBC WITH DIFFERENTIAL/PLATELET
Basophils Absolute: 0.1 10*3/uL (ref 0.0–0.2)
Basos: 1 %
EOS (ABSOLUTE): 0.4 10*3/uL (ref 0.0–0.4)
Eos: 5 %
Hematocrit: 49.6 % — ABNORMAL HIGH (ref 34.0–46.6)
Hemoglobin: 16.8 g/dL — ABNORMAL HIGH (ref 11.1–15.9)
Immature Grans (Abs): 0 10*3/uL (ref 0.0–0.1)
Immature Granulocytes: 0 %
Lymphocytes Absolute: 3 10*3/uL (ref 0.7–3.1)
Lymphs: 38 %
MCH: 30.5 pg (ref 26.6–33.0)
MCHC: 33.9 g/dL (ref 31.5–35.7)
MCV: 90 fL (ref 79–97)
Monocytes Absolute: 0.5 10*3/uL (ref 0.1–0.9)
Monocytes: 7 %
Neutrophils Absolute: 3.8 10*3/uL (ref 1.4–7.0)
Neutrophils: 49 %
Platelets: 261 10*3/uL (ref 150–450)
RBC: 5.5 x10E6/uL — ABNORMAL HIGH (ref 3.77–5.28)
RDW: 12.8 % (ref 11.7–15.4)
WBC: 7.8 10*3/uL (ref 3.4–10.8)

## 2022-09-11 LAB — COMPREHENSIVE METABOLIC PANEL
ALT: 37 IU/L — ABNORMAL HIGH (ref 0–32)
AST: 36 IU/L (ref 0–40)
Albumin/Globulin Ratio: 1.3 (ref 1.2–2.2)
Albumin: 4.3 g/dL (ref 3.9–4.9)
Alkaline Phosphatase: 162 IU/L — ABNORMAL HIGH (ref 44–121)
BUN/Creatinine Ratio: 10 — ABNORMAL LOW (ref 12–28)
BUN: 7 mg/dL — ABNORMAL LOW (ref 8–27)
Bilirubin Total: 0.3 mg/dL (ref 0.0–1.2)
CO2: 22 mmol/L (ref 20–29)
Calcium: 9.3 mg/dL (ref 8.7–10.3)
Chloride: 102 mmol/L (ref 96–106)
Creatinine, Ser: 0.72 mg/dL (ref 0.57–1.00)
Globulin, Total: 3.2 g/dL (ref 1.5–4.5)
Glucose: 157 mg/dL — ABNORMAL HIGH (ref 70–99)
Potassium: 4.5 mmol/L (ref 3.5–5.2)
Sodium: 139 mmol/L (ref 134–144)
Total Protein: 7.5 g/dL (ref 6.0–8.5)
eGFR: 94 mL/min/{1.73_m2} (ref >59)

## 2022-09-11 LAB — TSH: TSH: 1.48 u[IU]/mL (ref 0.450–4.500)

## 2022-09-11 LAB — LIPID PANEL W/O CHOL/HDL RATIO
Cholesterol, Total: 264 mg/dL — ABNORMAL HIGH (ref 100–199)
HDL: 51 mg/dL (ref >39)
LDL Chol Calc (NIH): 162 mg/dL — ABNORMAL HIGH (ref 0–99)
Triglycerides: 274 mg/dL — ABNORMAL HIGH (ref 0–149)
VLDL Cholesterol Cal: 51 mg/dL — ABNORMAL HIGH (ref 5–40)

## 2022-10-08 ENCOUNTER — Encounter: Payer: Self-pay | Admitting: Pulmonary Disease

## 2022-10-08 ENCOUNTER — Ambulatory Visit: Payer: BC Managed Care – PPO | Admitting: Pulmonary Disease

## 2022-10-08 VITALS — BP 126/86 | HR 72 | Temp 97.1°F | Ht 62.25 in | Wt 163.6 lb

## 2022-10-08 DIAGNOSIS — G4733 Obstructive sleep apnea (adult) (pediatric): Secondary | ICD-10-CM

## 2022-10-08 DIAGNOSIS — J0111 Acute recurrent frontal sinusitis: Secondary | ICD-10-CM | POA: Diagnosis not present

## 2022-10-08 DIAGNOSIS — J418 Mixed simple and mucopurulent chronic bronchitis: Secondary | ICD-10-CM | POA: Diagnosis not present

## 2022-10-08 DIAGNOSIS — F17210 Nicotine dependence, cigarettes, uncomplicated: Secondary | ICD-10-CM

## 2022-10-08 MED ORDER — AZITHROMYCIN 250 MG PO TABS: ORAL_TABLET | ORAL | 0 refills | Status: AC

## 2022-10-08 NOTE — Progress Notes (Signed)
Subjective:    Patient ID: Monica Hull, female    DOB: [birth year], 62 y.o.   MRN: [id number] Patient Care Team: Dorcas Carrow, DO as PCP - General (Family Medicine) Antonieta Iba, MD as PCP - Cardiology (Cardiology) Salena Saner, MD as Consulting Physician (Pulmonary Disease)  Chief Complaint  Patient presents with   Follow-up    SOB with exertion. Occasional wheezing. Dry cough.    HPI This is a 62 year old female, current every day smoker currently smoking a pack a day, with 39-pack-year history of smoking. PMH significant for COPD, sleep apnea, allergic rhinitis, GERD, hypertension, carotid stenosis, diet controlled diabetes mellitus, hyperlipidemia.  I last saw the patient on 09 July 2022. She is maintained on Breztri 2 puffs twice a day.  Her only complaint today is that of nasal congestion with occasional purulent nasal discharge and frontal headache over the last week.  She has a tendency towards recurrent sinusitis.  Dyspnea however is at baseline.  She has not had to use rescue albuterol 2-3 times per week lately.   Cough with sputum production white to clear mostly in the mornings.  No hemoptysis.  No fevers, chills or sweats. She has had malaise. She has not had any chest pain, no orthopnea no paroxysmal nocturnal dyspnea.  She does not endorse any other symptomatology.    She has obstructive sleep apnea and has CPAP however, she has had multiple dental extractions and dental surgery and cannot tolerate it.  She is to have her last dental surgery coming up in the next week.  Basically her teeth had to be extracted.  DATA 09/26/2021 PFTs: FEV1 1.70 L or 71% predicted, FVC 2.13 L or 68% predicted, FEV1/FVC 80%, no bronchodilator response.  Lung volumes normal, diffusion capacity normal.  Patient had difficulty during the test.  No significant obstruction. 05/15/2022 sleep study, in lab: Mild obstructive sleep apnea AHI of 9.7 events per hour O2 sats  85%. 05/23/2022 LDCT chest: Mild centrilobular emphysema. Stable small bilateral pulmonary nodules largest 5.5 mm. Lung RADS 2, benign appearance or behavior. 08/21/2022 chest x-ray PA and lateral: No evidence of acute disease.  Review of Systems A 10 point review of systems was performed and it is as noted above otherwise negative.  Patient Active Problem List   Diagnosis Date Noted   Aortic atherosclerosis 04/08/2019   Syncope 03/24/2018   Allergic rhinitis 02/27/2017   Current smoker 02/20/2016   Carotid stenosis 02/16/2016   Thyroid nodule 11/01/2015   Type 2 diabetes mellitus with other specified complication 08/18/2015   PMB (postmenopausal bleeding) 06/30/2015   Obesity (BMI 30.0-34.9) 06/30/2015   Paresthesias 02/01/2015   DJD (degenerative joint disease), cervical 02/01/2015   Stress incontinence 12/21/2014   Asthma    GERD (gastroesophageal reflux disease)    Sleep apnea    Hyperlipidemia    Hypertension    COPD (chronic obstructive pulmonary disease)    Nicotine dependence, cigarettes, w unsp disorders    Social History   Tobacco Use   Smoking status: Every Day    Packs/day: 1.00    Years: 39.00    Additional pack years: 0.00    Total pack years: 39.00    Types: Cigarettes   Smokeless tobacco: Never   Tobacco comments:    1 PPD- 10/08/2022 khj  Substance Use Topics   Alcohol use: Never   Allergies  Allergen Reactions   Oxycodone    Advair Hfa [Fluticasone-Salmeterol] Rash    Rash and  thrush   Anoro Ellipta [Umeclidinium-Vilanterol] Rash    Causes patients tongue to break out   Biaxin [Clarithromycin] Nausea And Vomiting and Rash   Current Meds  Medication Sig   albuterol (PROVENTIL) (2.5 MG/3ML) 0.083% nebulizer solution Take 3 mLs (2.5 mg total) by nebulization every 6 (six) hours as needed for wheezing or shortness of breath.   albuterol (VENTOLIN HFA) 108 (90 Base) MCG/ACT inhaler Inhale 2 puffs into the lungs every 6 (six) hours as needed for  wheezing or shortness of breath.   aspirin EC 81 MG tablet Take 81 mg by mouth daily. Swallow whole.   atorvastatin (LIPITOR) 80 MG tablet Take 1 tablet (80 mg total) by mouth daily.   benzonatate (TESSALON) 200 MG capsule Take 1 capsule (200 mg total) by mouth 2 (two) times daily as needed for cough.   Budeson-Glycopyrrol-Formoterol (BREZTRI AEROSPHERE) 160-9-4.8 MCG/ACT AERO Inhale 2 puffs into the lungs in the morning and at bedtime.   celecoxib (CELEBREX) 200 MG capsule Take 200 mg by mouth 2 (two) times daily.   cetirizine (ZYRTEC) 10 MG tablet Take 1 tablet (10 mg total) by mouth daily as needed for allergies.   fesoterodine (TOVIAZ) 8 MG TB24 tablet Take 1 tablet (8 mg total) by mouth daily.   fluticasone (FLONASE) 50 MCG/ACT nasal spray Place 2 sprays into both nostrils daily as needed for allergies.   linaclotide (LINZESS) 145 MCG CAPS capsule Take 145 mcg by mouth daily before breakfast.   lisinopril (ZESTRIL) 40 MG tablet Take 1 tablet (40 mg total) by mouth daily.   metFORMIN (GLUCOPHAGE-XR) 500 MG 24 hr tablet Take 2 tablets (1,000 mg total) by mouth daily with breakfast.   pantoprazole (PROTONIX) 40 MG tablet Take 1 tablet (40 mg total) by mouth every morning.   Semaglutide,0.25 or 0.5MG /DOS, (OZEMPIC, 0.25 OR 0.5 MG/DOSE,) 2 MG/3ML SOPN Inject 0.5 mg into the skin once a week.   Spacer/Aero-Holding Chambers (AEROCHAMBER MV) inhaler Use as instructed   trolamine salicylate (ASPERCREME) 10 % cream Apply 1 application  topically as needed for muscle pain.   Immunization History  Administered Date(s) Administered   COVID-19, mRNA, vaccine(Comirnaty)12 years and older 06/14/2022   Influenza,inj,Quad PF,6+ Mos 03/07/2015, 02/29/2016, 05/09/2017, 03/24/2018, 03/05/2019, 04/14/2020, 04/25/2021   Influenza-Unspecified 03/25/2014, 04/07/2022   PFIZER(Purple Top)SARS-COV-2 Vaccination 09/10/2019, 10/06/2019, 04/14/2020   Pneumococcal Polysaccharide-23 09/17/2014   Respiratory Syncytial  Virus Vaccine,Recomb Aduvanted(Arexvy) 04/07/2022   Tdap 12/21/2014   Zoster Recombinat (Shingrix) 02/07/2021, 09/10/2022        Objective:   Physical Exam BP 126/86 (BP Location: Left Arm, Cuff Size: Normal)   Pulse 72   Temp (!) 97.1 F (36.2 C)   Ht 5' 2.25" (1.581 m)   Wt 163 lb 9.6 oz (74.2 kg)   SpO2 97%   BMI 29.68 kg/m   SpO2: 97 % O2 Device: None (Room air)  GENERAL: Somewhat disheveled, overweight woman, no acute distress, fully ambulatory, smells heavily of tobacco.  No conversational dyspnea.  Looks much older than stated age, plethoric. HEAD: Normocephalic, atraumatic.  EYES: Pupils equal, round, reactive to light.  No scleral icterus.  MOUTH: Very poor dentition, missing teeth.  Oral mucosa moist.  No thrush.  Edentulous upper after multiple extractions. NECK: Supple. No thyromegaly. Trachea midline. No JVD.  No adenopathy. PULMONARY: Good air entry bilaterally.  Coarse breath sounds with scattered rhonchi and wheezes. CARDIOVASCULAR: S1 and S2. Regular rate and rhythm.  No rubs, murmurs or gallops heard. ABDOMEN: Obese otherwise benign. MUSCULOSKELETAL: No joint deformity,  no clubbing, no edema.  NEUROLOGIC: Neuro grossly normal. SKIN: Intact,warm,dry. PSYCH: Mood and behavior normal.       Assessment & Plan:     ICD-10-CM   1. Mixed simple and mucopurulent chronic bronchitis  J41.8    Continue Breztri 2 puffs twice a day Continue as needed albuterol    2. Acute recurrent frontal sinusitis  J01.11    Azithromycin Z-Pak Patient states she can take the medication and works for her    3. Obstructive sleep apnea syndrome  G47.33    Unable to use CPAP due to recent dental surgery Hold on use until cleared by dentist    4. Tobacco dependence due to cigarettes  F17.210    Patient was counseled with regards to discontinuation of smoking Total counseling time 3 to 5 minutes     Meds ordered this encounter  Medications   azithromycin (ZITHROMAX) 250 MG  tablet    Sig: Take 2 tablets (500 mg) on  Day 1,  followed by 1 tablet (250 mg) once daily on Days 2 through 5.    Dispense:  6 each    Refill:  0   Will see the patient in follow-up in 6 to 8 weeks time she is to contact us prior to that time should any new difficulties arise.  Gailen Shelter, MD Advanced Bronchoscopy P[facility]    *This note was dictated using voice recognition software/Dragon.  Despite best efforts to proofread, errors can occur which can change the meaning. Any transcriptional errors that result from this process are unintentional and may not be fully corrected at the time of dictation.

## 2022-10-08 NOTE — Patient Instructions (Signed)
We sent in a prescription for a Z-Pak which you state you have been able to take before without difficulty.  This should help with your sinusitis.  We will make a note that you have not been able to wear the CPAP due to recent dental surgery.  Will see him in follow-up in 6 to 8 weeks time call sooner should any new problems arise.

## 2022-10-25 DIAGNOSIS — G4733 Obstructive sleep apnea (adult) (pediatric): Secondary | ICD-10-CM | POA: Diagnosis not present

## 2022-10-29 ENCOUNTER — Telehealth (INDEPENDENT_AMBULATORY_CARE_PROVIDER_SITE_OTHER): Payer: BC Managed Care – PPO | Admitting: Family Medicine

## 2022-10-29 DIAGNOSIS — R051 Acute cough: Secondary | ICD-10-CM

## 2022-10-29 DIAGNOSIS — J988 Other specified respiratory disorders: Secondary | ICD-10-CM

## 2022-10-29 MED ORDER — PREDNISONE 10 MG PO TABS: ORAL_TABLET | ORAL | 0 refills | Status: DC

## 2022-10-29 NOTE — Progress Notes (Addendum)
There were no vitals taken for this visit.   Subjective:    Patient ID: Monica Hull, female    DOB: 04/01/1961, 62 y.o.   MRN: [id number]  This visit was completed via video visit through MyChart due to the restrictions of the COVID-19 pandemic. All issues as above were discussed and addressed. Physical exam was done as above through visual confirmation on video through MyChart. If it was felt that the patient should be evaluated in the office, they were directed there. The patient verbally consented to this visit. Location of the patient: home Location of the provider: work Those involved with this call:  Provider:  Prescott Gum, NP CMA: Malen Gauze, CMA Time spent on call:  15 minutes on the phone discussing health concerns. 10 minutes total spent in review of patient's record and preparation of their chart.  HPI: Monica Hull is a 62 y.o. female  Chief Complaint  Patient presents with   URI    Cough and congestion that started 4 days ago.    UPPER RESPIRATORY TRACT INFECTION She has been watching her grandson for a while who was recently diagnosed with bronchitis. Onset:4 days.  Worst symptom:Congestion Fever: no Cough: yes, congested Shortness of breath: no Wheezing: yes Chest pain: no Chest tightness: no Chest congestion: yes Nasal congestion: no Runny nose: yes Post nasal drip: yes Sneezing: no Sore throat: no Swollen glands: no Sinus pressure: no Headache: no Face pain: no Toothache: no Ear pain: no  Ear pressure: no  Eyes red/itching:no Eye drainage/crusting: yes at night.  Vomiting: no Rash: no Fatigue: yes Sick contacts:  Exposed to grandson  Strep contacts: no  Context: stable Recurrent sinusitis: no Relief with OTC cold/cough medications: no  Treatments attempted:  Tessalon pearl, Zyrtec     Relevant past medical, surgical, family and social history reviewed and updated as indicated. Interim medical history since our  last visit reviewed. Allergies and medications reviewed and updated.  Review of Systems  Constitutional:  Positive for fatigue. Negative for chills and fever.  HENT:  Positive for congestion, ear discharge, postnasal drip, rhinorrhea and sneezing. Negative for ear pain, sinus pressure, sinus pain and sore throat.   Eyes:  Negative for discharge, redness and itching.  Respiratory:  Positive for cough and wheezing. Negative for chest tightness and shortness of breath.   Cardiovascular:  Negative for chest pain.  Gastrointestinal:  Negative for vomiting.  Skin:  Negative for rash.  Neurological:  Negative for headaches.   Per HPI unless specifically indicated above     Objective:    There were no vitals taken for this visit.  Wt Readings from Last 3 Encounters:  10/08/22 163 lb 9.6 oz (74.2 kg)  09/10/22 163 lb 1.6 oz (74 kg)  09/03/22 163 lb (73.9 kg)    Physical Exam Constitutional:      Appearance: She is ill-appearing.  HENT:     Head: Normocephalic.     Nose: Congestion and rhinorrhea present. Rhinorrhea is clear.     Mouth/Throat:     Mouth: Mucous membranes are moist.  Eyes:     General: No scleral icterus.       Right eye: No discharge.        Left eye: No discharge.  Pulmonary:     Effort: Respiratory distress present.     Comments: Cough with congestion Musculoskeletal:     Cervical back: Normal range of motion.  Neurological:     Mental Status: She is alert  and oriented to person, place, and time.  Psychiatric:        Mood and Affect: Mood normal.        Behavior: Behavior normal.        Thought Content: Thought content normal.        Judgment: Judgment normal.     Results for orders placed or performed in visit on 09/10/22  Bayer DCA Hb A1c Waived  Result Value Ref Range   HB A1C (BAYER DCA - WAIVED) 6.5 (H) 4.8 - 5.6 %  Microalbumin, Urine Waived  Result Value Ref Range   Microalb, Ur Waived 10 0 - 19 mg/L   Creatinine, Urine Waived 50 10 - 300  mg/dL   Microalb/Creat Ratio 30-300 (H) <30 mg/g  CBC with Differential/Platelet  Result Value Ref Range   WBC 7.8 3.4 - 10.8 x10E3/uL   RBC 5.50 (H) 3.77 - 5.28 x10E6/uL   Hemoglobin 16.8 (H) 11.1 - 15.9 g/dL   Hematocrit 29.5 (H) 28.4 - 46.6 %   MCV 90 79 - 97 fL   MCH 30.5 26.6 - 33.0 pg   MCHC 33.9 31.5 - 35.7 g/dL   RDW 13.2 44.0 - 10.2 %   Platelets 261 150 - 450 x10E3/uL   Neutrophils 49 Not Estab. %   Lymphs 38 Not Estab. %   Monocytes 7 Not Estab. %   Eos 5 Not Estab. %   Basos 1 Not Estab. %   Neutrophils Absolute 3.8 1.4 - 7.0 x10E3/uL   Lymphocytes Absolute 3.0 0.7 - 3.1 x10E3/uL   Monocytes Absolute 0.5 0.1 - 0.9 x10E3/uL   EOS (ABSOLUTE) 0.4 0.0 - 0.4 x10E3/uL   Basophils Absolute 0.1 0.0 - 0.2 x10E3/uL   Immature Granulocytes 0 Not Estab. %   Immature Grans (Abs) 0.0 0.0 - 0.1 x10E3/uL  Comprehensive metabolic panel  Result Value Ref Range   Glucose 157 (H) 70 - 99 mg/dL   BUN 7 (L) 8 - 27 mg/dL   Creatinine, Ser 7.25 0.57 - 1.00 mg/dL   eGFR 94 >36 UY/QIH/4.74   BUN/Creatinine Ratio 10 (L) 12 - 28   Sodium 139 134 - 144 mmol/L   Potassium 4.5 3.5 - 5.2 mmol/L   Chloride 102 96 - 106 mmol/L   CO2 22 20 - 29 mmol/L   Calcium 9.3 8.7 - 10.3 mg/dL   Total Protein 7.5 6.0 - 8.5 g/dL   Albumin 4.3 3.9 - 4.9 g/dL   Globulin, Total 3.2 1.5 - 4.5 g/dL   Albumin/Globulin Ratio 1.3 1.2 - 2.2   Bilirubin Total 0.3 0.0 - 1.2 mg/dL   Alkaline Phosphatase 162 (H) 44 - 121 IU/L   AST 36 0 - 40 IU/L   ALT 37 (H) 0 - 32 IU/L  Lipid Panel w/o Chol/HDL Ratio  Result Value Ref Range   Cholesterol, Total 264 (H) 100 - 199 mg/dL   Triglycerides 259 (H) 0 - 149 mg/dL   HDL 51 >56 mg/dL   VLDL Cholesterol Cal 51 (H) 5 - 40 mg/dL   LDL Chol Calc (NIH) 387 (H) 0 - 99 mg/dL  Urinalysis, Routine w reflex microscopic  Result Value Ref Range   Specific Gravity, UA 1.015 1.005 - 1.030   pH, UA 7.0 5.0 - 7.5   Color, UA Yellow Yellow   Appearance Ur Clear Clear    Leukocytes,UA Negative Negative   Protein,UA Negative Negative/Trace   Glucose, UA Negative Negative   Ketones, UA Negative Negative   RBC, UA Negative  Negative   Bilirubin, UA Negative Negative   Urobilinogen, Ur 1.0 0.2 - 1.0 mg/dL   Nitrite, UA Negative Negative   Microscopic Examination Comment   TSH  Result Value Ref Range   TSH 1.480 0.450 - 4.500 uIU/mL      Assessment & Plan:   Problem List Items Addressed This Visit   None Visit Diagnoses     Congestion of respiratory tract    -  Primary   Acute, ongoing. Prednisone 6 day taper given, instructed to continue use of flonase. Start using albuterol  inhaler and nebulizer for wheezing or sob.   Relevant Medications   predniSONE (DELTASONE) 10 MG tablet   Acute cough       Acute, ongoing. Continue use of tessalon pearl along with albuterol inhaler and nebulizer.   Relevant Medications   predniSONE (DELTASONE) 10 MG tablet        Follow up plan: Return in about 1 week (around 11/05/2022) for cough and congestion.

## 2022-11-05 ENCOUNTER — Encounter: Admission: RE | Payer: Self-pay | Source: Home / Self Care

## 2022-11-05 ENCOUNTER — Ambulatory Visit
Admission: RE | Admit: 2022-11-05 | Payer: BC Managed Care – PPO | Source: Home / Self Care | Admitting: Gastroenterology

## 2022-11-05 SURGERY — COLONOSCOPY WITH PROPOFOL
Anesthesia: General

## 2022-11-06 ENCOUNTER — Ambulatory Visit: Payer: Self-pay | Admitting: *Deleted

## 2022-11-06 ENCOUNTER — Ambulatory Visit
Admission: RE | Admit: 2022-11-06 | Discharge: 2022-11-06 | Disposition: A | Discharge status: Home / Self Care | Payer: BC Managed Care – PPO | Attending: Family Medicine | Admitting: Family Medicine

## 2022-11-06 ENCOUNTER — Ambulatory Visit
Admission: RE | Admit: 2022-11-06 | Discharge: 2022-11-06 | Disposition: A | Discharge status: Home / Self Care | Payer: BC Managed Care – PPO | Source: Ambulatory Visit | Attending: Family Medicine | Admitting: Family Medicine

## 2022-11-06 ENCOUNTER — Ambulatory Visit (INDEPENDENT_AMBULATORY_CARE_PROVIDER_SITE_OTHER): Payer: BC Managed Care – PPO | Admitting: Family Medicine

## 2022-11-06 VITALS — BP 140/85 | HR 63 | Temp 97.9°F | Ht 62.6 in | Wt 167.3 lb

## 2022-11-06 DIAGNOSIS — J441 Chronic obstructive pulmonary disease with (acute) exacerbation: Secondary | ICD-10-CM

## 2022-11-06 DIAGNOSIS — R059 Cough, unspecified: Secondary | ICD-10-CM | POA: Diagnosis not present

## 2022-11-06 MED ORDER — PREDNISONE 10 MG PO TABS: ORAL_TABLET | ORAL | 0 refills | Status: DC

## 2022-11-06 MED ORDER — ALBUTEROL SULFATE (2.5 MG/3ML) 0.083% IN NEBU
2.5000 mg | INHALATION_SOLUTION | Freq: Once | RESPIRATORY_TRACT | Status: AC
  Administered 2022-11-06: 2.5 mg via RESPIRATORY_TRACT

## 2022-11-06 NOTE — Assessment & Plan Note (Addendum)
Acute, ongoing. Albuterol nebulizer given in office today for wheezing. 12 day Prednisone taper given for symptom management. Chest xray ordered to rule out pneumonia. Recommend use of albuterol inhaler/nebulizer every 6 hrs, tessalon x2day, zyrtec PRN, and daily use of Breztri inhaler. Recommend use of CPAP nightly. Provided education on COPD exacerbation. Instructed to follow up with pulmonology once prednisone taper is complete.

## 2022-11-06 NOTE — Patient Instructions (Addendum)
Continue using Breztri inhaler daily, use tessalon x2 day Continue using Zyrtec as needed Start Prednisone 12 day taper Use albuterol inhaler or nebulizer every 6 hours Start using CPAP every night

## 2022-11-06 NOTE — Progress Notes (Signed)
BP (!) 140/85   Pulse 63   Temp 97.9 F (36.6 C) (Oral)   Ht 5' 2.6" (1.59 m)   Wt 167 lb 4.8 oz (75.9 kg)   SpO2 99%   BMI 30.02 kg/m    Subjective:    Patient ID: Monica Hull, female    DOB: 09/21/1960, 62 y.o.   MRN: [id number]  HPI: Monica Hull is a 62 y.o. female  Chief Complaint  Patient presents with   URI follow up   Elevated BP: Patient BP recheck 140/85, she has not taken her Lisinopril this morning.   COPD EXACERBATION One month ago she was treated with zpack for COPD exacerbation, she states "it did not seem to help". She completed her last dose of the 6 day prednisone taper yesterday. She is currently taking Zyrtec, Breztri, flonase, and tessalon. She uses CPAP for OSA but was recommended to pause after dental surgery, she admits she will start back using this now.  Worst symptom:Congestion and cough  Fever: no Cough: yes, dry with nighttime awakening Shortness of breath: yes Wheezing: yes Chest pain: yes, with cough Chest tightness: no Chest congestion: no Nasal congestion: yes Runny nose: yes Post nasal drip: no Sneezing: yes Sore throat: no Swollen glands: no Sinus pressure: no Headache: no Face pain: no Toothache: no Ear pain: no  Ear pressure: yes right ear Eyes red/itching:no Eye drainage/crusting: yes in left eye when waking up in the morning, no drainage throughout the day.  Vomiting: no Rash: no Fatigue: yes Sick contacts: no Strep contacts: no  Context: stable Recurrent sinusitis: no Relief with OTC cold/cough medications: no  Treatments attempted: mucinex , tessalon provides minimal relief  Relevant past medical, surgical, family and social history reviewed and updated as indicated. Interim medical history since our last visit reviewed. Allergies and medications reviewed and updated.  Review of Systems  Constitutional:  Positive for fatigue. Negative for chills and fever.  HENT:  Positive for congestion,  rhinorrhea, sinus pressure and sneezing. Negative for ear pain, postnasal drip, sinus pain and sore throat.        Right ear pressure  Eyes:  Positive for discharge. Negative for redness and itching.  Respiratory:  Positive for cough, shortness of breath and wheezing. Negative for chest tightness.   Cardiovascular:  Negative for chest pain.  Gastrointestinal:  Negative for vomiting.  Skin:  Negative for rash.  Neurological:  Negative for headaches.    Per HPI unless specifically indicated above     Objective:    BP (!) 140/85   Pulse 63   Temp 97.9 F (36.6 C) (Oral)   Ht 5' 2.6" (1.59 m)   Wt 167 lb 4.8 oz (75.9 kg)   SpO2 99%   BMI 30.02 kg/m   Wt Readings from Last 3 Encounters:  11/06/22 167 lb 4.8 oz (75.9 kg)  10/08/22 163 lb 9.6 oz (74.2 kg)  09/10/22 163 lb 1.6 oz (74 kg)    Physical Exam Vitals and nursing note reviewed.  Constitutional:      General: She is not in acute distress.    Appearance: She is ill-appearing. She is not toxic-appearing or diaphoretic.  HENT:     Head: Normocephalic and atraumatic.     Right Ear: Ear canal and external ear normal. A middle ear effusion is present. There is no impacted cerumen.     Left Ear: Tympanic membrane, ear canal and external ear normal. There is no impacted cerumen.  Nose: Nasal tenderness, congestion and rhinorrhea present. No mucosal edema. Rhinorrhea is purulent.     Right Turbinates: Swollen and pale.     Left Turbinates: Swollen and pale.     Mouth/Throat:     Mouth: Mucous membranes are moist.     Comments: No pharynx present Eyes:     General: No scleral icterus.       Right eye: No discharge.        Left eye: Discharge present.    Extraocular Movements: Extraocular movements intact.     Conjunctiva/sclera: Conjunctivae normal.     Pupils: Pupils are equal, round, and reactive to light.  Neck:     Vascular: No carotid bruit.  Cardiovascular:     Rate and Rhythm: Normal rate and regular rhythm.      Pulses: Normal pulses.     Heart sounds: Normal heart sounds. No murmur heard.    No friction rub. No gallop.  Pulmonary:     Effort: No respiratory distress.     Breath sounds: Decreased air movement present. No stridor. Wheezing and rales present. No rhonchi.  Chest:     Chest wall: Tenderness present.  Musculoskeletal:        General: Normal range of motion.     Cervical back: Normal range of motion and neck supple. No rigidity. No muscular tenderness.  Lymphadenopathy:     Cervical: No cervical adenopathy.  Skin:    General: Skin is warm and dry.     Coloration: Skin is not jaundiced or pale.     Findings: No bruising, erythema, lesion or rash.  Neurological:     General: No focal deficit present.     Mental Status: She is alert and oriented to person, place, and time. Mental status is at baseline.     Cranial Nerves: No cranial nerve deficit.     Sensory: No sensory deficit.     Motor: No weakness.     Coordination: Coordination normal.     Gait: Gait normal.     Deep Tendon Reflexes: Reflexes normal.  Psychiatric:        Mood and Affect: Mood normal.        Behavior: Behavior normal.        Thought Content: Thought content normal.        Judgment: Judgment normal.     Results for orders placed or performed in visit on 09/10/22  Bayer DCA Hb A1c Waived  Result Value Ref Range   HB A1C (BAYER DCA - WAIVED) 6.5 (H) 4.8 - 5.6 %  Microalbumin, Urine Waived  Result Value Ref Range   Microalb, Ur Waived 10 0 - 19 mg/L   Creatinine, Urine Waived 50 10 - 300 mg/dL   Microalb/Creat Ratio 30-300 (H) <30 mg/g  CBC with Differential/Platelet  Result Value Ref Range   WBC 7.8 3.4 - 10.8 x10E3/uL   RBC 5.50 (H) 3.77 - 5.28 x10E6/uL   Hemoglobin 16.8 (H) 11.1 - 15.9 g/dL   Hematocrit 16.1 (H) 09.6 - 46.6 %   MCV 90 79 - 97 fL   MCH 30.5 26.6 - 33.0 pg   MCHC 33.9 31.5 - 35.7 g/dL   RDW 04.5 40.9 - 81.1 %   Platelets 261 150 - 450 x10E3/uL   Neutrophils 49 Not Estab. %    Lymphs 38 Not Estab. %   Monocytes 7 Not Estab. %   Eos 5 Not Estab. %   Basos 1 Not Estab. %  Neutrophils Absolute 3.8 1.4 - 7.0 x10E3/uL   Lymphocytes Absolute 3.0 0.7 - 3.1 x10E3/uL   Monocytes Absolute 0.5 0.1 - 0.9 x10E3/uL   EOS (ABSOLUTE) 0.4 0.0 - 0.4 x10E3/uL   Basophils Absolute 0.1 0.0 - 0.2 x10E3/uL   Immature Granulocytes 0 Not Estab. %   Immature Grans (Abs) 0.0 0.0 - 0.1 x10E3/uL  Comprehensive metabolic panel  Result Value Ref Range   Glucose 157 (H) 70 - 99 mg/dL   BUN 7 (L) 8 - 27 mg/dL   Creatinine, Ser 4.09 0.57 - 1.00 mg/dL   eGFR 94 >81 XB/JYN/8.29   BUN/Creatinine Ratio 10 (L) 12 - 28   Sodium 139 134 - 144 mmol/L   Potassium 4.5 3.5 - 5.2 mmol/L   Chloride 102 96 - 106 mmol/L   CO2 22 20 - 29 mmol/L   Calcium 9.3 8.7 - 10.3 mg/dL   Total Protein 7.5 6.0 - 8.5 g/dL   Albumin 4.3 3.9 - 4.9 g/dL   Globulin, Total 3.2 1.5 - 4.5 g/dL   Albumin/Globulin Ratio 1.3 1.2 - 2.2   Bilirubin Total 0.3 0.0 - 1.2 mg/dL   Alkaline Phosphatase 162 (H) 44 - 121 IU/L   AST 36 0 - 40 IU/L   ALT 37 (H) 0 - 32 IU/L  Lipid Panel w/o Chol/HDL Ratio  Result Value Ref Range   Cholesterol, Total 264 (H) 100 - 199 mg/dL   Triglycerides 562 (H) 0 - 149 mg/dL   HDL 51 >13 mg/dL   VLDL Cholesterol Cal 51 (H) 5 - 40 mg/dL   LDL Chol Calc (NIH) 086 (H) 0 - 99 mg/dL  Urinalysis, Routine w reflex microscopic  Result Value Ref Range   Specific Gravity, UA 1.015 1.005 - 1.030   pH, UA 7.0 5.0 - 7.5   Color, UA Yellow Yellow   Appearance Ur Clear Clear   Leukocytes,UA Negative Negative   Protein,UA Negative Negative/Trace   Glucose, UA Negative Negative   Ketones, UA Negative Negative   RBC, UA Negative Negative   Bilirubin, UA Negative Negative   Urobilinogen, Ur 1.0 0.2 - 1.0 mg/dL   Nitrite, UA Negative Negative   Microscopic Examination Comment   TSH  Result Value Ref Range   TSH 1.480 0.450 - 4.500 uIU/mL      Assessment & Plan:   Problem List Items Addressed This  Visit       Respiratory   COPD exacerbation (HCC) - Primary    Acute, ongoing. Albuterol nebulizer given in office today for wheezing. 12 day Prednisone taper given for symptom management. Chest xray ordered to rule out pneumonia. Recommend use of albuterol inhaler/nebulizer every 6 hrs, tessalon x2day, zyrtec PRN, and daily use of Breztri inhaler. Recommend use of CPAP nightly. Provided education on COPD exacerbation. Instructed to follow up with pulmonology once prednisone taper is complete.       Relevant Medications   predniSONE (DELTASONE) 10 MG tablet   Other Relevant Orders   DG Chest 2 View     Follow up plan: Return With pulmonology on 5/28 or if symptoms worsen or fail to improve.

## 2022-11-06 NOTE — Telephone Encounter (Signed)
  Chief Complaint: pharmacy calling to clarify prednisone order Symptoms: na Frequency: na Pertinent Negatives: Patient denies na Disposition: [] ED /[] Urgent Care (no appt availability in office) / [] Appointment(In office/virtual)/ []  [place] [facility]/ [] Home Care/ [] Refused Recommended Disposition /[] [facility]/ [x]  Follow-up with PCP Additional Notes:   Please clarify order. Pharmacy reports prednisone order needs clarification.  Quantity to dispense is #21 and does not match directions ordered to taper. Needs to order to dispense #42.  Please advise.      Reason for Disposition  [1] Pharmacy calling with prescription question AND [2] triager unable to answer question  Answer Assessment - Initial Assessment Questions 1. NAME of MEDICINE: "What medicine(s) are you calling about?"     prednisone 2. QUESTION: "What is your question?" (e.g., double dose of medicine, side effect)     Pharmacy reports quantity ordered does not match directions. 3. PRESCRIBER: "Who prescribed the medicine?" Reason: if prescribed by specialist, call should be referred to that group.     Pearly, NP 4. SYMPTOMS: "Do you have any symptoms?" If Yes, ask: "What symptoms are you having?"  "How bad are the symptoms (e.g., mild, moderate, severe)     na 5. PREGNANCY:  "Is there any chance that you are pregnant?" "When was your last menstrual period?"     na  Protocols used: Medication Question Call-A-AH

## 2022-11-08 MED ORDER — PREDNISONE 10 MG PO TABS: ORAL_TABLET | ORAL | 0 refills | Status: DC

## 2022-11-08 NOTE — Telephone Encounter (Signed)
Called patient to inform her that the medication has been sent over.  Apologized for any inconveinence.

## 2022-11-09 ENCOUNTER — Ambulatory Visit: Payer: Self-pay | Admitting: *Deleted

## 2022-11-09 NOTE — Telephone Encounter (Signed)
11/09/2022 12:16 PM EDT  Summary: cough, requesting antibiotic   Pt husband Lorri Diprima states that the pt cough has not gotten any better and is wanting to see if an antibiotic could be called in for the pt. Per Mr. Buhman pt received an x ray on 11/06/22 and he has not gotten the results back and states that pt PCP was waiting on the x ray before calling any antibiotic in. When checking the imaging folder it is not showing that the results are in yet. Mr. Murden would like a call back regarding an antibiotic for pt . Phone number- [phone]          Call History   Type Contact Phone/Fax User  11/09/2022 12:13 PM EDT Phone (Incoming) Leamer Jr.,Donald R (Emergency Contact) [phone] Honor Junes    Reason for Disposition  [1] Caller requesting NON-URGENT health information AND [2] PCP's office is the best resource    Chest  x ray done on 11/06/2022 has not been interpreted by radiology or provider.   Husband called in regarding these results.   This determines whether she needs an antibiotic or not and they have not heard from anyone regarding the result.  Answer Assessment - Initial Assessment Questions 1. REASON FOR CALL or QUESTION: "What is your reason for calling today?" or "How can I best help you?" or "What question do you have that I can help answer?"     I returned call to Mr. Arambul, husband.   They are still waiting for an x ray result from a chest x ray that was done on 11/06/2022.   Saw Prescott Gum, FNP on 11/06/2022.    X ray is done but there isn't an interpretation from a radiologist or the provider. I let Mr. Cohenour know I would send a high priority message to [facility] and follow up on those results and have someone give him a call back.    He was agreeable to this plan.   He also mentioned that were going to put his wife on an antibiotic or not depending on those x ray results.    I think she needs to be on an antibiotic.   Her cough is not  better.  High priority message sent.  Protocols used: Information Only Call - No Triage-A-AH

## 2022-11-09 NOTE — Telephone Encounter (Signed)
  Chief Complaint: No interpretation of chest X ray from 11/06/2022 by radiologist or provider.   Husband calling in inquiring about the result and the need for an antibiotic for his wife.  Symptoms: Her cough is not better.   This x ray was supposed to determine if she needed an antibiotic or not.   We have not heard from anyone. Frequency: X ray taken on 11/06/2022 during OV with Prescott Gum, FNP. Pertinent Negatives: Patient denies getting better.   Disposition: [] ED /[] Urgent Care (no appt availability in office) / [] Appointment(In office/virtual)/ []  [facility]/ [] Home Care/ [] Refused Recommended Disposition /[] [facility]/ [x]  Follow-up with PCP Additional Notes: High priority note sent to [facility] for Prescott Gum, FNP.    Husband agreeable to someone calling him back regarding an antibiotic.    She has COPD and asthma and has a really bad cough that has not gotten better since she was seen on 5/14.

## 2022-11-12 MED ORDER — AZITHROMYCIN 250 MG PO TABS: ORAL_TABLET | ORAL | 0 refills | Status: AC

## 2022-11-12 NOTE — Telephone Encounter (Signed)
Patient was made aware of Prescott Gum, NP. Patient verbalized understanding and has no further questions at this time.

## 2022-11-12 NOTE — Addendum Note (Signed)
Addended by: Prescott Gum on: 11/12/2022 08:21 AM   Modules accepted: Orders

## 2022-11-15 ENCOUNTER — Telehealth: Payer: Self-pay

## 2022-11-15 NOTE — Telephone Encounter (Signed)
Spoke to patient's spouse, Donald(DPR). He stated that patient is having issues with tolerating cpap machine. She has her own machine. she did not get a new Cpap from [employer]. I asked Dorinda Hill if machine had SD card. He is unsure but will check. If it does have SD card, patient will bring to upcoming appointment.  Nothing further needed.

## 2022-11-20 ENCOUNTER — Ambulatory Visit: Payer: BC Managed Care – PPO | Admitting: Primary Care

## 2022-11-25 DIAGNOSIS — G4733 Obstructive sleep apnea (adult) (pediatric): Secondary | ICD-10-CM | POA: Diagnosis not present

## 2022-12-06 DIAGNOSIS — M19012 Primary osteoarthritis, left shoulder: Secondary | ICD-10-CM | POA: Diagnosis not present

## 2022-12-06 DIAGNOSIS — G8929 Other chronic pain: Secondary | ICD-10-CM | POA: Diagnosis not present

## 2022-12-11 ENCOUNTER — Ambulatory Visit: Payer: BC Managed Care – PPO | Admitting: Family Medicine

## 2022-12-19 ENCOUNTER — Telehealth (INDEPENDENT_AMBULATORY_CARE_PROVIDER_SITE_OTHER): Payer: BC Managed Care – PPO | Admitting: Family Medicine

## 2022-12-19 ENCOUNTER — Encounter: Payer: Self-pay | Admitting: Family Medicine

## 2022-12-19 DIAGNOSIS — M79641 Pain in right hand: Secondary | ICD-10-CM

## 2022-12-19 MED ORDER — DICLOFENAC SODIUM 75 MG PO TBEC
75.0000 mg | DELAYED_RELEASE_TABLET | Freq: Two times a day (BID) | ORAL | 3 refills | Status: DC
Start: 2022-12-19 — End: 2023-10-15

## 2022-12-19 NOTE — Progress Notes (Signed)
There were no vitals taken for this visit.   Subjective:    Patient ID: Monica Hull, female    DOB: 1961/01/20, 62 y.o.   MRN: [id number]  HPI: Monica Hull is a 62 y.o. female  Chief Complaint  Patient presents with   Joint Swelling    Pt states it her right hand goes numb and painful the last couple of weeks    HAND PAIN Duration: couple of months, really bad in the past couple of weeks Involved hand: right Mechanism of injury: unknown Location: diffuse Onset: sudden Severity: severe  Quality: numb and tingling, sharp pain Frequency: constant Radiation: no Relief with NSAIDs?: mild Weakness: yes Numbness: yes Redness: no Swelling:yes Bruising: no Fevers: no  Relevant past medical, surgical, family and social history reviewed and updated as indicated. Interim medical history since our last visit reviewed. Allergies and medications reviewed and updated.  Review of Systems  Constitutional: Negative.   Respiratory: Negative.    Cardiovascular: Negative.   Gastrointestinal: Negative.   Musculoskeletal:  Positive for arthralgias, joint swelling and myalgias. Negative for back pain, gait problem, neck pain and neck stiffness.  Skin: Negative.   Neurological:  Positive for weakness and numbness. Negative for dizziness, tremors, seizures, syncope, facial asymmetry, speech difficulty, light-headedness and headaches.  Psychiatric/Behavioral: Negative.      Per HPI unless specifically indicated above     Objective:    There were no vitals taken for this visit.  Wt Readings from Last 3 Encounters:  11/06/22 167 lb 4.8 oz (75.9 kg)  10/08/22 163 lb 9.6 oz (74.2 kg)  09/10/22 163 lb 1.6 oz (74 kg)    Physical Exam Vitals and nursing note reviewed.  Constitutional:      General: She is not in acute distress.    Appearance: Normal appearance. She is obese. She is not ill-appearing, toxic-appearing or diaphoretic.  HENT:     Head: Normocephalic  and atraumatic.     Right Ear: External ear normal.     Left Ear: External ear normal.     Nose: Nose normal.     Mouth/Throat:     Mouth: Mucous membranes are moist.     Pharynx: Oropharynx is clear.  Eyes:     General: No scleral icterus.       Right eye: No discharge.        Left eye: No discharge.     Conjunctiva/sclera: Conjunctivae normal.     Pupils: Pupils are equal, round, and reactive to light.  Pulmonary:     Effort: Pulmonary effort is normal. No respiratory distress.     Comments: Speaking in full sentences Musculoskeletal:        General: Normal range of motion.     Cervical back: Normal range of motion.  Skin:    Coloration: Skin is not jaundiced or pale.     Findings: No bruising, erythema, lesion or rash.  Neurological:     Mental Status: She is alert and oriented to person, place, and time. Mental status is at baseline.  Psychiatric:        Mood and Affect: Mood normal.        Behavior: Behavior normal.        Thought Content: Thought content normal.        Judgment: Judgment normal.     Results for orders placed or performed in visit on 09/10/22  Bayer DCA Hb A1c Waived  Result Value Ref Range   HB A1C (BAYER  DCA - WAIVED) 6.5 (H) 4.8 - 5.6 %  Microalbumin, Urine Waived  Result Value Ref Range   Microalb, Ur Waived 10 0 - 19 mg/L   Creatinine, Urine Waived 50 10 - 300 mg/dL   Microalb/Creat Ratio 30-300 (H) <30 mg/g  CBC with Differential/Platelet  Result Value Ref Range   WBC 7.8 3.4 - 10.8 x10E3/uL   RBC 5.50 (H) 3.77 - 5.28 x10E6/uL   Hemoglobin 16.8 (H) 11.1 - 15.9 g/dL   Hematocrit 40.9 (H) 81.1 - 46.6 %   MCV 90 79 - 97 fL   MCH 30.5 26.6 - 33.0 pg   MCHC 33.9 31.5 - 35.7 g/dL   RDW 91.4 78.2 - 95.6 %   Platelets 261 150 - 450 x10E3/uL   Neutrophils 49 Not Estab. %   Lymphs 38 Not Estab. %   Monocytes 7 Not Estab. %   Eos 5 Not Estab. %   Basos 1 Not Estab. %   Neutrophils Absolute 3.8 1.4 - 7.0 x10E3/uL   Lymphocytes Absolute 3.0 0.7  - 3.1 x10E3/uL   Monocytes Absolute 0.5 0.1 - 0.9 x10E3/uL   EOS (ABSOLUTE) 0.4 0.0 - 0.4 x10E3/uL   Basophils Absolute 0.1 0.0 - 0.2 x10E3/uL   Immature Granulocytes 0 Not Estab. %   Immature Grans (Abs) 0.0 0.0 - 0.1 x10E3/uL  Comprehensive metabolic panel  Result Value Ref Range   Glucose 157 (H) 70 - 99 mg/dL   BUN 7 (L) 8 - 27 mg/dL   Creatinine, Ser 2.13 0.57 - 1.00 mg/dL   eGFR 94 >08 MV/HQI/6.96   BUN/Creatinine Ratio 10 (L) 12 - 28   Sodium 139 134 - 144 mmol/L   Potassium 4.5 3.5 - 5.2 mmol/L   Chloride 102 96 - 106 mmol/L   CO2 22 20 - 29 mmol/L   Calcium 9.3 8.7 - 10.3 mg/dL   Total Protein 7.5 6.0 - 8.5 g/dL   Albumin 4.3 3.9 - 4.9 g/dL   Globulin, Total 3.2 1.5 - 4.5 g/dL   Albumin/Globulin Ratio 1.3 1.2 - 2.2   Bilirubin Total 0.3 0.0 - 1.2 mg/dL   Alkaline Phosphatase 162 (H) 44 - 121 IU/L   AST 36 0 - 40 IU/L   ALT 37 (H) 0 - 32 IU/L  Lipid Panel w/o Chol/HDL Ratio  Result Value Ref Range   Cholesterol, Total 264 (H) 100 - 199 mg/dL   Triglycerides 295 (H) 0 - 149 mg/dL   HDL 51 >28 mg/dL   VLDL Cholesterol Cal 51 (H) 5 - 40 mg/dL   LDL Chol Calc (NIH) 413 (H) 0 - 99 mg/dL  Urinalysis, Routine w reflex microscopic  Result Value Ref Range   Specific Gravity, UA 1.015 1.005 - 1.030   pH, UA 7.0 5.0 - 7.5   Color, UA Yellow Yellow   Appearance Ur Clear Clear   Leukocytes,UA Negative Negative   Protein,UA Negative Negative/Trace   Glucose, UA Negative Negative   Ketones, UA Negative Negative   RBC, UA Negative Negative   Bilirubin, UA Negative Negative   Urobilinogen, Ur 1.0 0.2 - 1.0 mg/dL   Nitrite, UA Negative Negative   Microscopic Examination Comment   TSH  Result Value Ref Range   TSH 1.480 0.450 - 4.500 uIU/mL      Assessment & Plan:   Problem List Items Addressed This Visit   None Visit Diagnoses     Right hand pain    -  Primary   In exacerbation. Will get her  restarted on voltaren. She will reach out to her orthopedist. Call with any  concerns.        Follow up plan: Return if symptoms worsen or fail to improve.   This visit was completed via video visit through MyChart due to the restrictions of the COVID-19 pandemic. All issues as above were discussed and addressed. Physical exam was done as above through visual confirmation on video through MyChart. If it was felt that the patient should be evaluated in the office, they were directed there. The patient verbally consented to this visit. Location of the patient: home Location of the provider: work Those involved with this call:  Provider: Olevia Perches, DO CMA: Wilhemena Durie, CMA Front Desk/Registration:  Servando Snare   Time spent on call:  15 minutes with patient face to face via video conference. More than 50% of this time was spent in counseling and coordination of care. 23 minutes total spent in review of patient's record and preparation of their chart.

## 2022-12-21 ENCOUNTER — Telehealth: Payer: Self-pay

## 2022-12-21 NOTE — Telephone Encounter (Signed)
ATC patient to ask if she has been using cpap. If so, can she bring SD card in. Unable to leave vm due to mailbox being full.  Spoke to [place] with [employer]. According to their record, patient is using an older machine.

## 2022-12-24 ENCOUNTER — Ambulatory Visit: Payer: BC Managed Care – PPO | Admitting: Primary Care

## 2022-12-24 NOTE — Telephone Encounter (Signed)
ATC x2--unable to leave vm due to mailbox being full. Will close encounter per office protocol.   

## 2022-12-25 DIAGNOSIS — G4733 Obstructive sleep apnea (adult) (pediatric): Secondary | ICD-10-CM | POA: Diagnosis not present

## 2022-12-31 DIAGNOSIS — M19041 Primary osteoarthritis, right hand: Secondary | ICD-10-CM | POA: Diagnosis not present

## 2022-12-31 DIAGNOSIS — G5601 Carpal tunnel syndrome, right upper limb: Secondary | ICD-10-CM | POA: Diagnosis not present

## 2022-12-31 DIAGNOSIS — E785 Hyperlipidemia, unspecified: Secondary | ICD-10-CM | POA: Insufficient documentation

## 2023-01-29 ENCOUNTER — Ambulatory Visit: Payer: BC Managed Care – PPO | Admitting: Family Medicine

## 2023-01-29 ENCOUNTER — Encounter: Payer: Self-pay | Admitting: Family Medicine

## 2023-01-29 VITALS — BP 154/88 | HR 61 | Temp 98.2°F | Wt 172.8 lb

## 2023-01-29 DIAGNOSIS — Z7984 Long term (current) use of oral hypoglycemic drugs: Secondary | ICD-10-CM | POA: Diagnosis not present

## 2023-01-29 DIAGNOSIS — Z1211 Encounter for screening for malignant neoplasm of colon: Secondary | ICD-10-CM

## 2023-01-29 DIAGNOSIS — E1169 Type 2 diabetes mellitus with other specified complication: Secondary | ICD-10-CM

## 2023-01-29 LAB — BAYER DCA HB A1C WAIVED: HB A1C (BAYER DCA - WAIVED): 6.8 % — ABNORMAL HIGH (ref 4.8–5.6)

## 2023-01-29 MED ORDER — PANTOPRAZOLE SODIUM 40 MG PO TBEC: 40.0000 mg | DELAYED_RELEASE_TABLET | Freq: Every morning | ORAL | 0 refills | Status: DC

## 2023-01-29 MED ORDER — BREZTRI AEROSPHERE 160-9-4.8 MCG/ACT IN AERO: 2.0000 | INHALATION_SPRAY | Freq: Two times a day (BID) | RESPIRATORY_TRACT | 1 refills | Status: DC

## 2023-01-29 MED ORDER — ATORVASTATIN CALCIUM 80 MG PO TABS: 80.0000 mg | ORAL_TABLET | Freq: Every day | ORAL | 0 refills | Status: DC

## 2023-01-29 MED ORDER — OZEMPIC (0.25 OR 0.5 MG/DOSE) 2 MG/3ML ~~LOC~~ SOPN: 0.5000 mg | PEN_INJECTOR | SUBCUTANEOUS | 0 refills | Status: DC

## 2023-01-29 MED ORDER — METFORMIN HCL ER 500 MG PO TB24: 1000.0000 mg | ORAL_TABLET | Freq: Every day | ORAL | 0 refills | Status: DC

## 2023-01-29 MED ORDER — LISINOPRIL 40 MG PO TABS: 40.0000 mg | ORAL_TABLET | Freq: Every day | ORAL | 0 refills | Status: DC

## 2023-01-29 NOTE — Progress Notes (Signed)
BP (!) 154/88   Pulse 61   Temp 98.2 F (36.8 C) (Oral)   Wt 172 lb 12.8 oz (78.4 kg)   SpO2 95%   BMI 31.00 kg/m    Subjective:    Patient ID: Monica Hull, female    DOB: 19-Jan-1961, 62 y.o.   MRN: [id number]  HPI: Monica Hull is a 62 y.o. female  Chief Complaint  Patient presents with   Diabetes   DIABETES Hypoglycemic episodes:no Polydipsia/polyuria: no Visual disturbance: no Chest pain: no Paresthesias: no Glucose Monitoring: no  Accucheck frequency: Not Checking Taking Insulin?: no Blood Pressure Monitoring: not checking Retinal Examination: Not up to Date Foot Exam: Up to Date Diabetic Education: Completed Pneumovax: Up to Date Influenza: Not up to Date Aspirin: no  Relevant past medical, surgical, family and social history reviewed and updated as indicated. Interim medical history since our last visit reviewed. Allergies and medications reviewed and updated.  Review of Systems  Constitutional: Negative.   Respiratory: Negative.    Cardiovascular: Negative.   Gastrointestinal: Negative.   Musculoskeletal: Negative.   Psychiatric/Behavioral: Negative.      Per HPI unless specifically indicated above     Objective:    BP (!) 154/88   Pulse 61   Temp 98.2 F (36.8 C) (Oral)   Wt 172 lb 12.8 oz (78.4 kg)   SpO2 95%   BMI 31.00 kg/m   Wt Readings from Last 3 Encounters:  01/29/23 172 lb 12.8 oz (78.4 kg)  11/06/22 167 lb 4.8 oz (75.9 kg)  10/08/22 163 lb 9.6 oz (74.2 kg)    Physical Exam Vitals and nursing note reviewed.  Constitutional:      General: She is not in acute distress.    Appearance: Normal appearance. She is not ill-appearing, toxic-appearing or diaphoretic.  HENT:     Head: Normocephalic and atraumatic.     Right Ear: External ear normal.     Left Ear: External ear normal.     Nose: Nose normal.     Mouth/Throat:     Mouth: Mucous membranes are moist.     Pharynx: Oropharynx is clear.  Eyes:      General: No scleral icterus.       Right eye: No discharge.        Left eye: No discharge.     Extraocular Movements: Extraocular movements intact.     Conjunctiva/sclera: Conjunctivae normal.     Pupils: Pupils are equal, round, and reactive to light.  Cardiovascular:     Rate and Rhythm: Normal rate and regular rhythm.     Pulses: Normal pulses.     Heart sounds: Normal heart sounds. No murmur heard.    No friction rub. No gallop.  Pulmonary:     Effort: Pulmonary effort is normal. No respiratory distress.     Breath sounds: Normal breath sounds. No stridor. No wheezing, rhonchi or rales.  Chest:     Chest wall: No tenderness.  Musculoskeletal:        General: Normal range of motion.     Cervical back: Normal range of motion and neck supple.  Skin:    General: Skin is warm and dry.     Capillary Refill: Capillary refill takes less than 2 seconds.     Coloration: Skin is not jaundiced or pale.     Findings: No bruising, erythema, lesion or rash.  Neurological:     General: No focal deficit present.     Mental Status:  She is alert and oriented to person, place, and time. Mental status is at baseline.  Psychiatric:        Mood and Affect: Mood normal.        Behavior: Behavior normal.        Thought Content: Thought content normal.        Judgment: Judgment normal.     Results for orders placed or performed in visit on 01/29/23  Bayer DCA Hb A1c Waived  Result Value Ref Range   HB A1C (BAYER DCA - WAIVED) 6.8 (H) 4.8 - 5.6 %      Assessment & Plan:   Problem List Items Addressed This Visit       Endocrine   Type 2 diabetes mellitus with other specified complication (HCC) - Primary    Doing well with A1c of 6.8. Continue current regimen. Continue to monitor. Call with any concerns.       Relevant Medications   lisinopril (ZESTRIL) 40 MG tablet   atorvastatin (LIPITOR) 80 MG tablet   metFORMIN (GLUCOPHAGE-XR) 500 MG 24 hr tablet   Semaglutide,0.25 or 0.5MG /DOS,  (OZEMPIC, 0.25 OR 0.5 MG/DOSE,) 2 MG/3ML SOPN   Other Relevant Orders   Bayer DCA Hb A1c Waived (Completed)   Other Visit Diagnoses     Screening for colon cancer       Cologuard ordered today.   Relevant Orders   Cologuard        Follow up plan: Return in about 3 months (around 05/01/2023) for same day as the eye exam.

## 2023-01-29 NOTE — Assessment & Plan Note (Signed)
Doing well with A1c of 6.8. Continue current regimen. Continue to monitor. Call with any concerns.  

## 2023-01-30 ENCOUNTER — Ambulatory Visit: Payer: BC Managed Care – PPO | Admitting: Family Medicine

## 2023-01-30 ENCOUNTER — Other Ambulatory Visit (HOSPITAL_COMMUNITY)
Admission: RE | Admit: 2023-01-30 | Discharge: 2023-01-30 | Disposition: A | Discharge status: Home / Self Care | Payer: BC Managed Care – PPO | Source: Ambulatory Visit | Attending: Family Medicine | Admitting: Family Medicine

## 2023-01-30 ENCOUNTER — Encounter: Payer: Self-pay | Admitting: Family Medicine

## 2023-01-30 VITALS — BP 153/92 | HR 62 | Temp 97.5°F | Wt 172.8 lb

## 2023-01-30 DIAGNOSIS — D485 Neoplasm of uncertain behavior of skin: Secondary | ICD-10-CM | POA: Insufficient documentation

## 2023-01-30 NOTE — Progress Notes (Signed)
BP (!) 153/92   Pulse 62   Temp (!) 97.5 F (36.4 C) (Oral)   Wt 172 lb 12.8 oz (78.4 kg)   SpO2 98%   BMI 31.00 kg/m    Subjective:    Patient ID: Monica Hull, female    DOB: 03/11/1961, 62 y.o.   MRN: [id number]  HPI: Sheral Spoelstra is a 62 y.o. female  Chief Complaint  Patient presents with   mole removal   SKIN LESION Duration: chronic Location: R upper arm Painful: no Itching: no Onset: gradual Context: bigger Associated signs and symptoms: None History of skin cancer: no  Relevant past medical, surgical, family and social history reviewed and updated as indicated. Interim medical history since our last visit reviewed. Allergies and medications reviewed and updated.  Review of Systems  Constitutional: Negative.   Respiratory: Negative.    Cardiovascular: Negative.   Gastrointestinal: Negative.   Musculoskeletal: Negative.   Neurological: Negative.   Psychiatric/Behavioral: Negative.      Per HPI unless specifically indicated above     Objective:    BP (!) 153/92   Pulse 62   Temp (!) 97.5 F (36.4 C) (Oral)   Wt 172 lb 12.8 oz (78.4 kg)   SpO2 98%   BMI 31.00 kg/m   Wt Readings from Last 3 Encounters:  01/30/23 172 lb 12.8 oz (78.4 kg)  01/29/23 172 lb 12.8 oz (78.4 kg)  11/06/22 167 lb 4.8 oz (75.9 kg)    Physical Exam Vitals and nursing note reviewed.  Constitutional:      General: She is not in acute distress.    Appearance: Normal appearance. She is well-developed.  HENT:     Head: Normocephalic and atraumatic.     Right Ear: Hearing and external ear normal.     Left Ear: Hearing and external ear normal.     Nose: Nose normal.     Mouth/Throat:     Mouth: Mucous membranes are moist.     Pharynx: Oropharynx is clear.  Eyes:     General: Lids are normal. No scleral icterus.       Right eye: No discharge.        Left eye: No discharge.     Conjunctiva/sclera: Conjunctivae normal.  Pulmonary:     Effort:  Pulmonary effort is normal. No respiratory distress.  Musculoskeletal:        General: Normal range of motion.  Skin:    Coloration: Skin is not jaundiced or pale.     Findings: No bruising, erythema, lesion or rash.     Comments: 1cm hyperpigmented lesion with lighter area on the inside on R upper arm   Neurological:     Mental Status: She is alert. Mental status is at baseline. She is disoriented.  Psychiatric:        Mood and Affect: Mood normal.        Speech: Speech normal.        Behavior: Behavior normal.        Thought Content: Thought content normal.        Judgment: Judgment normal.     Results for orders placed or performed in visit on 01/29/23  Bayer DCA Hb A1c Waived  Result Value Ref Range   HB A1C (BAYER DCA - WAIVED) 6.8 (H) 4.8 - 5.6 %      Assessment & Plan:   Problem List Items Addressed This Visit   None Visit Diagnoses     Neoplasm of  uncertain behavior of skin    -  Primary   Relevant Orders   Surgical pathology      Skin Procedure  Procedure: Informed consent given.  Sterile prep of the area.  Area infiltrated with lidocaine without epinephrine.  Using a surgical blade, part of the upper dermis shaved off and sent  for pathology.  Area cauterized. Pt ed on scarring.  Diagnosis:   ICD-10-CM   1. Neoplasm of uncertain behavior of skin  D48.5 Surgical pathology      Lesion Location/Size: 1cm hyperpigmented lesion with lighter area on the inside on R upper arm  Physician: Consent:  Risks, benefits, and alternative treatments discussed and all questions were answered.  Patient elected to proceed and verbal consent obtained.  Description: Area prepped and draped using semi-sterile technique. Area locally anesthetized using 3 cc's of lidocaine 1% plain. Shave biopsy of lesion performed using a dermablade.  Adequate hemostastis achieved using Silver Nitrate.Wound dressed after application of bacitracin ointment.  Post Procedure Instructions:  Wound care instructions discussed and patient was instructed to keep area clean and dry.  Signs and symptoms of infection discussed, patient agrees to contact the office ASAP should they occur.  Dressing change recommended every other day.   Follow up plan: Return October 30.

## 2023-02-05 ENCOUNTER — Ambulatory Visit: Payer: BC Managed Care – PPO | Admitting: Adult Health

## 2023-03-26 DIAGNOSIS — Z419 Encounter for procedure for purposes other than remedying health state, unspecified: Secondary | ICD-10-CM | POA: Diagnosis not present

## 2023-03-27 ENCOUNTER — Ambulatory Visit (INDEPENDENT_AMBULATORY_CARE_PROVIDER_SITE_OTHER): Payer: Medicaid Other | Admitting: Nurse Practitioner

## 2023-03-27 ENCOUNTER — Encounter: Payer: Self-pay | Admitting: Nurse Practitioner

## 2023-03-27 VITALS — BP 128/84 | HR 72 | Temp 98.1°F | Resp 18 | Ht 62.0 in | Wt 176.2 lb

## 2023-03-27 DIAGNOSIS — Z1211 Encounter for screening for malignant neoplasm of colon: Secondary | ICD-10-CM | POA: Diagnosis not present

## 2023-03-27 DIAGNOSIS — Q181 Preauricular sinus and cyst: Secondary | ICD-10-CM

## 2023-03-27 DIAGNOSIS — H9193 Unspecified hearing loss, bilateral: Secondary | ICD-10-CM | POA: Diagnosis not present

## 2023-03-27 DIAGNOSIS — Z7985 Long-term (current) use of injectable non-insulin antidiabetic drugs: Secondary | ICD-10-CM

## 2023-03-27 DIAGNOSIS — E1169 Type 2 diabetes mellitus with other specified complication: Secondary | ICD-10-CM

## 2023-03-27 DIAGNOSIS — E66811 Obesity, class 1: Secondary | ICD-10-CM | POA: Diagnosis not present

## 2023-03-27 NOTE — Assessment & Plan Note (Signed)
BMI 32.23.  Recommended eating smaller high protein, low fat meals more frequently and exercising 30 mins a day 5 times a week with a goal of 10-15lb weight loss in the next 3 months. Patient voiced their understanding and motivation to adhere to these recommendations.

## 2023-03-27 NOTE — Assessment & Plan Note (Signed)
Eye exam ordered

## 2023-03-27 NOTE — Progress Notes (Signed)
BP 128/84 (BP Location: Left Arm, Patient Position: Sitting, Cuff Size: Normal)   Pulse 72   Temp 98.1 F (36.7 C) (Oral)   Resp 18   Ht 5\' 2"  (1.575 m) Comment: per patient  Wt 176 lb 3.2 oz (79.9 kg)   SpO2 97%   BMI 32.23 kg/m    Subjective:    Patient ID: Monica Hull, female    DOB: 10/08/1960, 63 y.o.   MRN: [id number]  HPI: Monica Hull is a 62 y.o. female  Chief Complaint  Patient presents with   Ear Pain    Left   EAR LESION Has a bump to bottom of left ear. No recent trauma to ear.  Not painful. Would like referral for hearing loss to right ear for years. Duration: days Involved ear(s): left Fever: no Otorrhea: no Upper respiratory infection symptoms: no Pruritus: yes Hearing loss:  right ear at baseline Water immersion no Using Q-tips: no Recurrent otitis media: no Status: stable Treatments attempted: none   Relevant past medical, surgical, family and social history reviewed and updated as indicated. Interim medical history since our last visit reviewed. Allergies and medications reviewed and updated.  Review of Systems  Constitutional:  Negative for activity change, appetite change, diaphoresis, fatigue and fever.  HENT:  Negative for ear discharge and ear pain.   Respiratory:  Negative for cough, chest tightness and shortness of breath.   Cardiovascular:  Negative for chest pain, palpitations and leg swelling.  Gastrointestinal: Negative.   Neurological: Negative.   Psychiatric/Behavioral: Negative.      Per HPI unless specifically indicated above     Objective:    BP 128/84 (BP Location: Left Arm, Patient Position: Sitting, Cuff Size: Normal)   Pulse 72   Temp 98.1 F (36.7 C) (Oral)   Resp 18   Ht 5\' 2"  (1.575 m) Comment: per patient  Wt 176 lb 3.2 oz (79.9 kg)   SpO2 97%   BMI 32.23 kg/m   Wt Readings from Last 3 Encounters:  03/27/23 176 lb 3.2 oz (79.9 kg)  01/30/23 172 lb 12.8 oz (78.4 kg)  01/29/23 172 lb  12.8 oz (78.4 kg)    Physical Exam Vitals and nursing note reviewed.  Constitutional:      General: She is awake. She is not in acute distress.    Appearance: She is well-developed and well-groomed. She is obese. She is not ill-appearing or toxic-appearing.  HENT:     Head: Normocephalic.     Right Ear: Hearing, tympanic membrane, ear canal and external ear normal.     Left Ear: Hearing, tympanic membrane, ear canal and external ear normal.     Ears:      Nose: Nose normal. No rhinorrhea.     Mouth/Throat:     Mouth: Mucous membranes are moist.     Pharynx: No pharyngeal swelling, oropharyngeal exudate or posterior oropharyngeal erythema.     Comments: Edentulous.   Eyes:     General: Lids are normal.        Right eye: No discharge.        Left eye: No discharge.     Conjunctiva/sclera: Conjunctivae normal.     Pupils: Pupils are equal, round, and reactive to light.  Neck:     Thyroid: No thyromegaly.     Vascular: No carotid bruit.  Cardiovascular:     Rate and Rhythm: Normal rate and regular rhythm.     Heart sounds: Normal heart sounds. No murmur  heard.    No gallop.  Pulmonary:     Effort: Pulmonary effort is normal. No accessory muscle usage or respiratory distress.     Breath sounds: Normal breath sounds.  Abdominal:     General: Bowel sounds are normal. There is no distension.     Palpations: Abdomen is soft.     Tenderness: There is no abdominal tenderness.  Musculoskeletal:     Cervical back: Normal range of motion and neck supple.     Right lower leg: No edema.     Left lower leg: No edema.  Lymphadenopathy:     Cervical: No cervical adenopathy.  Skin:    General: Skin is warm and dry.  Neurological:     Mental Status: She is alert and oriented to person, place, and time.     Deep Tendon Reflexes: Reflexes are normal and symmetric.     Reflex Scores:      Brachioradialis reflexes are 2+ on the right side and 2+ on the left side.      Patellar reflexes are  2+ on the right side and 2+ on the left side. Psychiatric:        Attention and Perception: Attention normal.        Mood and Affect: Mood normal.        Speech: Speech normal.        Behavior: Behavior normal. Behavior is cooperative.        Thought Content: Thought content normal.     Results for orders placed or performed in visit on 01/30/23  Surgical pathology  Result Value Ref Range   SURGICAL PATHOLOGY      SURGICAL PATHOLOGY CASE: [id number] PATIENT: Monica Hull Surgical Pathology Report     Clinical History: Neoplasm of uncertain behavior of skin (nt)     FINAL MICROSCOPIC DIAGNOSIS:  A. SKIN, RIGHT UPPER ARM, BIOPSY: -  Verrucous keratosis.    GROSS DESCRIPTION:  Received in formalin is a 0.9 x 0.8 cm gray-brown skin shave with a 0.7 x 0.6 cm slightly raised tan-brown roughened papule.  The specimen is inked, bisected and submitted 1 block.  SW 01/31/2023   Final Diagnosis performed by Orene Desanctis DO.   Electronically signed 02/01/2023 Technical component performed at [employer]. [facility], [address] [place], [place] [address].  Professional component performed at [facility], [address], [place], [place] [address].  Immunohistochemistry Technical component (if applicable) was performed at [facility]. [address], [place], [place] [address].   IMMUNOHISTOCHEMISTRY DISCLAIM ER (if applicable): Some of these immunohistochemical stains may have been developed and the performance characteristics determine by [facility]. Some may not have been cleared or approved by the U.S. Food and Drug Administration. The FDA has determined that such clearance or approval is not necessary. This test is used for clinical purposes. It should not be regarded as investigational or for research. This laboratory is certified under the Clinical Laboratory Improvement Amendments of  1988 (CLIA-88) as qualified to perform high complexity clinical laboratory testing.  The controls stained appropriately.   IHC stains are performed on formalin fixed, paraffin embedded tissue using a 3,3"diaminobenzidine (DAB) chromogen and Leica Bond Autostainer System. The staining intensity of the nucleus is score manually and is reported as the percentage of tumor cell nuclei demonstrating specific nuclear staining. The specimens are fixed in 10% Neutral Formalin for at l east 6 hours and up to 72hrs. These tests are validated  on decalcified tissue. Results should be interpreted with caution given the possibility of false negative results on decalcified specimens. Antibody Clones are as follows ER-clone [id number], PR-clone 16, Ki67- clone [id number]. Some of these immunohistochemical stains may have been developed and the performance characteristics determined by [facility].       Assessment & Plan:   Problem List Items Addressed This Visit       Endocrine   Type 2 diabetes mellitus with other specified complication (HCC) - Primary    Eye exam ordered.      Relevant Orders   Ambulatory referral to Ophthalmology     Nervous and Auditory   Bilateral hearing loss    Ongoing for years per patient, R>L.  Would like hearing test and recommendations.      Relevant Orders   Ambulatory referral to ENT   Cyst on ear    To concha area of inner left ear.  No s/s infection.  Referral to ENT for further assessment and recommendations.      Relevant Orders   Ambulatory referral to ENT   Other Visit Diagnoses     Colon cancer screening       GI referral placed   Relevant Orders   Ambulatory referral to Gastroenterology        Follow up plan: Return if symptoms worsen or fail to improve.

## 2023-03-27 NOTE — Assessment & Plan Note (Signed)
To concha area of inner left ear.  No s/s infection.  Referral to ENT for further assessment and recommendations.

## 2023-03-27 NOTE — Patient Instructions (Signed)
Excision of Skin Lesions Excision of a skin lesion is the removal of a section of skin by making small incisions in the skin. Through this process, the lesion is completely removed. This procedure is often done to treat or prevent cancer or infection. It may also be done to improve cosmetic appearance. You may have this procedure to remove: Cancerous (malignant) growths, such as basal cell carcinoma, squamous cell carcinoma, or melanoma. Noncancerous (benign) growths, such as a cyst or lipoma. Growths, such as moles or skin tags, which may be removed for cosmetic reasons. Various excision or surgical techniques may be used depending on your condition, the location of the lesion, and your overall health. Tell your health care provider about: Any allergies you have. All medicines you are taking, including vitamins, herbs, eye drops, creams, and over-the-counter medicines. Any problems you or family members have had with anesthetic medicines. Any bleeding problems you have. Any surgeries you have had. Any medical conditions you have. Whether you are pregnant or may be pregnant. What are the risks? Generally, this is a safe procedure. However, problems may occur, including: Bleeding. Infection. Scarring. Recurrence of the cyst, lipoma, or cancer. Allergic reaction to anesthetics, surgical materials, or ointments. Damage to nerves, blood vessels, muscles, or other structures. What happens before the procedure? Medicines Ask your health care provider about: Changing or stopping your regular medicines. This is especially important if you are taking diabetes medicines or blood thinners. Taking medicines such as aspirin and ibuprofen. These medicines can thin your blood. Do not take these medicines unless your health care provider tells you to take them. Taking over-the-counter medicines, vitamins, herbs, and supplements. General instructions Do not use any products that contain nicotine or  tobacco. These products include cigarettes, chewing tobacco, and vaping devices, such as e-cigarettes. If you need help quitting, ask your health care provider. Follow instructions from your health care provider about eating or drinking restrictions. Ask your health care provider: How your surgery site will be marked. What steps will be taken to help prevent infection. These steps may include: Removing hair at the surgery site. Washing skin with a germ-killing soap. Taking antibiotic medicine. Ask your health care provider if you will need someone to take you home from the hospital or clinic after the procedure. What happens during the procedure?  You will be given a medicine to numb the area (local anesthetic). Your health care provider will remove the lesions using one of the following excision techniques. Complete surgical excision. This procedure may be done to treat a cancerous growth or a noncancerous cyst or lesion. A small scalpel or scissors will be used to gently cut around and under the lesion until it is completely removed. If bleeding occurs, it will be stopped with a device that delivers heat (electrocautery). The edges of the wound may be stitched (sutured) together. A bandage (dressing) will be applied. Samples will be sent to a lab for testing. Excision of a cyst. An incision will be made on the cyst. The entire cyst will be removed through the incision. The incision may be closed with sutures. Shave excision. This may be done to remove a mole or other small growths. A small blade or scalpel will be used to shave off the lesion. The wound is usually left to heal on its own without sutures. The sample may be sent to a lab for testing. Punch excision. This may be done to completely remove a mole or other small growths. A small tool that  is like a cookie cutter or a hole punch is used to cut a circle shape out of the skin. The outer edges of the skin will be sutured  together. The sample may be sent to a lab for testing. Mohs micrographic surgery. This is usually done to treat skin cancer. This type of excision is mostly used on the face and ears. This procedure is minimally invasive, and it ensures the best cosmetic outcome. A scalpel or a loop instrument will be used to remove layers of the lesion until all the abnormal or cancerous tissue has been removed. The wound may be sutured, depending on its size. The tissue will be checked under a microscope right away. The procedure may vary among health care providers and hospitals. At the end of any of these procedures, antibiotic ointment will be applied as needed. What happens after the procedure? Talk with your health care provider to discuss any test results, treatment options, and if necessary, the need for more tests. Keep all follow-up visits. This is important. Summary Excision of a skin lesion is the removal of a section of skin by making small incisions in the skin. This procedure is often done to treat or prevent skin cancer, remove benign growths, or it may be done to improve cosmetic appearance. Various excision or surgical techniques may be used depending on your condition, the location of the lesion, and your overall health. After the procedure, talk with your health care provider to discuss any test results, treatment options, and if necessary, the need for more tests. Keep all follow-up visits. This is important. This information is not intended to replace advice given to you by your health care provider. Make sure you discuss any questions you have with your health care provider. Document Revised: 01/10/2021 Document Reviewed: 01/10/2021 Elsevier Patient Education  2024 [employer].

## 2023-03-27 NOTE — Assessment & Plan Note (Signed)
Ongoing for years per patient, R>L.  Would like hearing test and recommendations.

## 2023-04-02 DIAGNOSIS — G5601 Carpal tunnel syndrome, right upper limb: Secondary | ICD-10-CM | POA: Diagnosis not present

## 2023-04-02 DIAGNOSIS — G8929 Other chronic pain: Secondary | ICD-10-CM | POA: Diagnosis not present

## 2023-04-02 DIAGNOSIS — M19012 Primary osteoarthritis, left shoulder: Secondary | ICD-10-CM | POA: Diagnosis not present

## 2023-04-09 ENCOUNTER — Other Ambulatory Visit: Payer: Self-pay | Admitting: Acute Care

## 2023-04-09 DIAGNOSIS — Z87891 Personal history of nicotine dependence: Secondary | ICD-10-CM

## 2023-04-09 DIAGNOSIS — F17210 Nicotine dependence, cigarettes, uncomplicated: Secondary | ICD-10-CM

## 2023-04-09 DIAGNOSIS — Z122 Encounter for screening for malignant neoplasm of respiratory organs: Secondary | ICD-10-CM

## 2023-04-22 DIAGNOSIS — H60331 Swimmer's ear, right ear: Secondary | ICD-10-CM | POA: Diagnosis not present

## 2023-04-22 DIAGNOSIS — H903 Sensorineural hearing loss, bilateral: Secondary | ICD-10-CM | POA: Diagnosis not present

## 2023-04-22 DIAGNOSIS — L723 Sebaceous cyst: Secondary | ICD-10-CM | POA: Diagnosis not present

## 2023-04-22 DIAGNOSIS — H6121 Impacted cerumen, right ear: Secondary | ICD-10-CM | POA: Diagnosis not present

## 2023-04-22 DIAGNOSIS — H6983 Other specified disorders of Eustachian tube, bilateral: Secondary | ICD-10-CM | POA: Diagnosis not present

## 2023-04-24 ENCOUNTER — Encounter: Payer: Self-pay | Admitting: Family Medicine

## 2023-04-24 ENCOUNTER — Ambulatory Visit (INDEPENDENT_AMBULATORY_CARE_PROVIDER_SITE_OTHER): Payer: Medicaid Other | Admitting: Family Medicine

## 2023-04-24 VITALS — BP 114/72 | HR 62 | Ht 62.0 in | Wt 178.2 lb

## 2023-04-24 DIAGNOSIS — G4733 Obstructive sleep apnea (adult) (pediatric): Secondary | ICD-10-CM

## 2023-04-24 DIAGNOSIS — J418 Mixed simple and mucopurulent chronic bronchitis: Secondary | ICD-10-CM

## 2023-04-24 DIAGNOSIS — I700 Atherosclerosis of aorta: Secondary | ICD-10-CM

## 2023-04-24 DIAGNOSIS — E1169 Type 2 diabetes mellitus with other specified complication: Secondary | ICD-10-CM

## 2023-04-24 DIAGNOSIS — F17200 Nicotine dependence, unspecified, uncomplicated: Secondary | ICD-10-CM

## 2023-04-24 DIAGNOSIS — I10 Essential (primary) hypertension: Secondary | ICD-10-CM

## 2023-04-24 DIAGNOSIS — E782 Mixed hyperlipidemia: Secondary | ICD-10-CM

## 2023-04-24 LAB — HM DIABETES EYE EXAM

## 2023-04-24 LAB — BAYER DCA HB A1C WAIVED: HB A1C (BAYER DCA - WAIVED): 7 % — ABNORMAL HIGH (ref 4.8–5.6)

## 2023-04-24 MED ORDER — BREZTRI AEROSPHERE 160-9-4.8 MCG/ACT IN AERO: 2.0000 | INHALATION_SPRAY | Freq: Two times a day (BID) | RESPIRATORY_TRACT | 1 refills | Status: DC

## 2023-04-24 MED ORDER — FLUTICASONE PROPIONATE 50 MCG/ACT NA SUSP: 2.0000 | Freq: Every day | NASAL | 12 refills | Status: AC | PRN

## 2023-04-24 MED ORDER — ALBUTEROL SULFATE (2.5 MG/3ML) 0.083% IN NEBU: 2.5000 mg | INHALATION_SOLUTION | Freq: Four times a day (QID) | RESPIRATORY_TRACT | 1 refills | Status: DC | PRN

## 2023-04-24 MED ORDER — METFORMIN HCL ER 500 MG PO TB24: 1000.0000 mg | ORAL_TABLET | Freq: Every day | ORAL | 1 refills | Status: DC

## 2023-04-24 MED ORDER — VARENICLINE TARTRATE (STARTER) 0.5 MG X 11 & 1 MG X 42 PO TBPK: ORAL_TABLET | ORAL | 0 refills | Status: DC

## 2023-04-24 MED ORDER — OZEMPIC (0.25 OR 0.5 MG/DOSE) 2 MG/3ML ~~LOC~~ SOPN: 0.5000 mg | PEN_INJECTOR | SUBCUTANEOUS | 1 refills | Status: DC

## 2023-04-24 MED ORDER — ALBUTEROL SULFATE HFA 108 (90 BASE) MCG/ACT IN AERS: 2.0000 | INHALATION_SPRAY | Freq: Four times a day (QID) | RESPIRATORY_TRACT | 6 refills | Status: DC | PRN

## 2023-04-24 MED ORDER — PANTOPRAZOLE SODIUM 40 MG PO TBEC: 40.0000 mg | DELAYED_RELEASE_TABLET | Freq: Every morning | ORAL | 1 refills | Status: DC

## 2023-04-24 MED ORDER — ATORVASTATIN CALCIUM 80 MG PO TABS: 80.0000 mg | ORAL_TABLET | Freq: Every day | ORAL | 1 refills | Status: DC

## 2023-04-24 MED ORDER — LISINOPRIL 40 MG PO TABS: 40.0000 mg | ORAL_TABLET | Freq: Every day | ORAL | 1 refills | Status: DC

## 2023-04-24 NOTE — Assessment & Plan Note (Signed)
Will keep BP and cholesterol under good control. Continue to monitor. Call with any concerns.  

## 2023-04-24 NOTE — Patient Instructions (Signed)
[facility]- [phone] ?

## 2023-04-24 NOTE — Assessment & Plan Note (Signed)
Husband is working on quitting- she'd like to try at the same time. Will start chantix. Recheck 1 month. Call with any concerns.

## 2023-04-24 NOTE — Assessment & Plan Note (Signed)
Under good control on current regimen. Continue current regimen. Continue to monitor. Call with any concerns. Refills given. Labs drawn today.   

## 2023-04-24 NOTE — Assessment & Plan Note (Signed)
Under good control on current regimen. Continue current regimen. Continue to monitor. Call with any concerns. Refills given.   

## 2023-04-24 NOTE — Assessment & Plan Note (Signed)
Unable to tolerate her CPAP. Would like to discuss inspire. Referral to sleep medicine placed- last sleep study in 2023, may need to wait until 3 years to see them.

## 2023-04-24 NOTE — Assessment & Plan Note (Addendum)
Doing well with A1c of 7, up slightly from last A1c of 6.8. Will continue current regimen and recheck in 3 months. Call with any concerns. Patient receiving retinal screening today.

## 2023-04-24 NOTE — Progress Notes (Signed)
Monica Hull arrived 04/24/2023 and has given verbal consent to obtain images and complete their overdue diabetic retinal screening.  The images have been sent to an ophthalmologist or optometrist for review and interpretation.  Results will be sent back to Dorcas Carrow, DO for review.  Patient has been informed they will be contacted when we receive the results via telephone or MyChart

## 2023-04-24 NOTE — Progress Notes (Signed)
BP 114/72   Pulse 62   Ht 5\' 2"  (1.575 m)   Wt 178 lb 3.2 oz (80.8 kg)   SpO2 97%   BMI 32.59 kg/m    Subjective:    Patient ID: Monica Hull, female    DOB: Sep 13, 1960, 61 y.o.   MRN: [id number]  HPI: Monica Hull is a 62 y.o. female  Chief Complaint  Patient presents with   Diabetes   DIABETES Hypoglycemic episodes:no Polydipsia/polyuria: no Visual disturbance: no Chest pain: no Paresthesias: no Glucose Monitoring: no  Accucheck frequency: Not Checking Taking Insulin?: no  Long acting insulin:  Short acting insulin: Blood Pressure Monitoring: not checking Retinal Examination: Not up to Date Foot Exam: Up to Date Diabetic Education: Completed Pneumovax: Up to Date Influenza: Up to Date Aspirin: yes  HYPERTENSION / HYPERLIPIDEMIA Satisfied with current treatment? yes Duration of hypertension: chronic BP monitoring frequency: not checking BP medication side effects: no Past BP meds: lisinopril Duration of hyperlipidemia: chronic Cholesterol medication side effects: no Cholesterol supplements: none Past cholesterol medications: atorvastatin Medication compliance: excellent compliance Aspirin: yes Recent stressors: no Recurrent headaches: no Visual changes: no Palpitations: no Dyspnea: no Chest pain: no Lower extremity edema: no Dizzy/lightheaded: no  SLEEP APNEA Sleep apnea status: uncontrolled Duration: chronic Satisfied with current treatment?:  no CPAP use:  no Sleep quality with CPAP use:  Can't tolerate it Treament compliance:poor compliance Last sleep study: 2023 Treatments attempted: CPAP Wakes feeling refreshed:  no Daytime hypersomnolence:  yes Fatigue:  yes Insomnia:  no Good sleep hygiene:  yes Difficulty falling asleep:  yes Difficulty staying asleep:  yes Snoring bothers bed partner:  no Observed apnea by bed partner: yes Obesity:  yes Hypertension: yes  Pulmonary hypertension:  no Coronary artery  disease:  no  SMOKING CESSATION Smoking Status: current every day smoker Smoking Amount: >1ppd Smoking Onset: 62yo Smoking Quit Date: not set Smoking triggers: stress Type of tobacco use: cigarettes Children in the house: yes Other household members who smoke: yes Treatments attempted: wellbutrin, nicoderm Pneumovax: UTD   Relevant past medical, surgical, family and social history reviewed and updated as indicated. Interim medical history since our last visit reviewed. Allergies and medications reviewed and updated.  Review of Systems  Constitutional: Negative.   HENT: Negative.    Eyes: Negative.   Respiratory:  Positive for cough, chest tightness, shortness of breath and wheezing. Negative for apnea, choking and stridor.   Cardiovascular: Negative.   Gastrointestinal: Negative.   Psychiatric/Behavioral: Negative.      Per HPI unless specifically indicated above     Objective:    BP 114/72   Pulse 62   Ht 5\' 2"  (1.575 m)   Wt 178 lb 3.2 oz (80.8 kg)   SpO2 97%   BMI 32.59 kg/m   Wt Readings from Last 3 Encounters:  04/24/23 178 lb 3.2 oz (80.8 kg)  03/27/23 176 lb 3.2 oz (79.9 kg)  01/30/23 172 lb 12.8 oz (78.4 kg)    Physical Exam Vitals and nursing note reviewed.  Constitutional:      General: She is not in acute distress.    Appearance: Normal appearance. She is obese. She is not ill-appearing, toxic-appearing or diaphoretic.  HENT:     Head: Normocephalic and atraumatic.     Right Ear: External ear normal.     Left Ear: External ear normal.     Nose: Nose normal.     Mouth/Throat:     Mouth: Mucous membranes are moist.  Pharynx: Oropharynx is clear.  Eyes:     General: No scleral icterus.       Right eye: No discharge.        Left eye: No discharge.     Extraocular Movements: Extraocular movements intact.     Conjunctiva/sclera: Conjunctivae normal.     Pupils: Pupils are equal, round, and reactive to light.  Cardiovascular:     Rate and  Rhythm: Normal rate and regular rhythm.     Pulses: Normal pulses.     Heart sounds: Normal heart sounds. No murmur heard.    No friction rub. No gallop.  Pulmonary:     Effort: Pulmonary effort is normal. No respiratory distress.     Breath sounds: Normal breath sounds. No stridor. No wheezing, rhonchi or rales.  Chest:     Chest wall: No tenderness.  Musculoskeletal:        General: Normal range of motion.     Cervical back: Normal range of motion and neck supple.  Skin:    General: Skin is warm and dry.     Capillary Refill: Capillary refill takes less than 2 seconds.     Coloration: Skin is not jaundiced or pale.     Findings: No bruising, erythema, lesion or rash.  Neurological:     General: No focal deficit present.     Mental Status: She is alert and oriented to person, place, and time. Mental status is at baseline.  Psychiatric:        Mood and Affect: Mood normal.        Behavior: Behavior normal.        Thought Content: Thought content normal.        Judgment: Judgment normal.     Results for orders placed or performed in visit on 04/24/23  Bayer DCA Hb A1c Waived  Result Value Ref Range   HB A1C (BAYER DCA - WAIVED) 7.0 (H) 4.8 - 5.6 %      Assessment & Plan:   Problem List Items Addressed This Visit       Cardiovascular and Mediastinum   Hypertension    Under good control on current regimen. Continue current regimen. Continue to monitor. Call with any concerns. Refills given. Labs drawn today.       Relevant Medications   atorvastatin (LIPITOR) 80 MG tablet   lisinopril (ZESTRIL) 40 MG tablet   Aortic atherosclerosis (HCC)    Will keep BP and cholesterol under good control. Continue to monitor. Call with any concerns.       Relevant Medications   atorvastatin (LIPITOR) 80 MG tablet   lisinopril (ZESTRIL) 40 MG tablet     Respiratory   Sleep apnea    Unable to tolerate her CPAP. Would like to discuss inspire. Referral to sleep medicine placed- last  sleep study in 2023, may need to wait until 3 years to see them.       Relevant Orders   Ambulatory referral to Sleep Studies   COPD (chronic obstructive pulmonary disease) (HCC)    Under good control on current regimen. Continue current regimen. Continue to monitor. Call with any concerns. Refills given.        Relevant Medications   albuterol (PROVENTIL) (2.5 MG/3ML) 0.083% nebulizer solution   albuterol (VENTOLIN HFA) 108 (90 Base) MCG/ACT inhaler   Budeson-Glycopyrrol-Formoterol (BREZTRI AEROSPHERE) 160-9-4.8 MCG/ACT AERO   fluticasone (FLONASE) 50 MCG/ACT nasal spray   Varenicline Tartrate, Starter, (CHANTIX STARTING MONTH PAK) 0.5 MG X 11 &  1 MG X 42 TBPK     Endocrine   Type 2 diabetes mellitus with other specified complication (HCC) - Primary    Doing well with A1c of 7, up slightly from last A1c of 6.8. Will continue current regimen and recheck in 3 months. Call with any concerns.       Relevant Medications   atorvastatin (LIPITOR) 80 MG tablet   lisinopril (ZESTRIL) 40 MG tablet   metFORMIN (GLUCOPHAGE-XR) 500 MG 24 hr tablet   Semaglutide,0.25 or 0.5MG /DOS, (OZEMPIC, 0.25 OR 0.5 MG/DOSE,) 2 MG/3ML SOPN   Other Relevant Orders   Bayer DCA Hb A1c Waived (Completed)   CBC with Differential/Platelet   Comprehensive metabolic panel   Lipid Panel w/o Chol/HDL Ratio   Color Fundus Photography - OU - Both Eyes     Other   Hyperlipidemia    Under good control on current regimen. Continue current regimen. Continue to monitor. Call with any concerns. Refills given. Labs drawn today.        Relevant Medications   atorvastatin (LIPITOR) 80 MG tablet   lisinopril (ZESTRIL) 40 MG tablet   Current smoker    Husband is working on quitting- she'd like to try at the same time. Will start chantix. Recheck 1 month. Call with any concerns.         Follow up plan: Return in about 4 weeks (around 05/22/2023).

## 2023-04-25 ENCOUNTER — Telehealth: Payer: Self-pay

## 2023-04-25 DIAGNOSIS — J209 Acute bronchitis, unspecified: Secondary | ICD-10-CM

## 2023-04-25 LAB — COMPREHENSIVE METABOLIC PANEL
ALT: 28 [IU]/L (ref 0–32)
AST: 25 [IU]/L (ref 0–40)
Albumin: 3.9 g/dL (ref 3.9–4.9)
Alkaline Phosphatase: 135 [IU]/L — ABNORMAL HIGH (ref 44–121)
BUN/Creatinine Ratio: 12 (ref 12–28)
BUN: 8 mg/dL (ref 8–27)
Bilirubin Total: 0.5 mg/dL (ref 0.0–1.2)
CO2: 21 mmol/L (ref 20–29)
Calcium: 9.1 mg/dL (ref 8.7–10.3)
Chloride: 107 mmol/L — ABNORMAL HIGH (ref 96–106)
Creatinine, Ser: 0.65 mg/dL (ref 0.57–1.00)
Globulin, Total: 2.6 g/dL (ref 1.5–4.5)
Glucose: 160 mg/dL — ABNORMAL HIGH (ref 70–99)
Potassium: 4.2 mmol/L (ref 3.5–5.2)
Sodium: 141 mmol/L (ref 134–144)
Total Protein: 6.5 g/dL (ref 6.0–8.5)
eGFR: 99 mL/min/{1.73_m2} (ref >59)

## 2023-04-25 LAB — CBC WITH DIFFERENTIAL/PLATELET
Basophils Absolute: 0.1 10*3/uL (ref 0.0–0.2)
Basos: 1 %
EOS (ABSOLUTE): 0.3 10*3/uL (ref 0.0–0.4)
Eos: 5 %
Hematocrit: 44.6 % (ref 34.0–46.6)
Hemoglobin: 14.7 g/dL (ref 11.1–15.9)
Immature Grans (Abs): 0 10*3/uL (ref 0.0–0.1)
Immature Granulocytes: 0 %
Lymphocytes Absolute: 2.9 10*3/uL (ref 0.7–3.1)
Lymphs: 41 %
MCH: 30.4 pg (ref 26.6–33.0)
MCHC: 33 g/dL (ref 31.5–35.7)
MCV: 92 fL (ref 79–97)
Monocytes Absolute: 0.4 10*3/uL (ref 0.1–0.9)
Monocytes: 6 %
Neutrophils Absolute: 3.2 10*3/uL (ref 1.4–7.0)
Neutrophils: 47 %
Platelets: 217 10*3/uL (ref 150–450)
RBC: 4.83 x10E6/uL (ref 3.77–5.28)
RDW: 12.2 % (ref 11.7–15.4)
WBC: 6.9 10*3/uL (ref 3.4–10.8)

## 2023-04-25 LAB — LIPID PANEL W/O CHOL/HDL RATIO
Cholesterol, Total: 180 mg/dL (ref 100–199)
HDL: 42 mg/dL (ref >39)
LDL Chol Calc (NIH): 109 mg/dL — ABNORMAL HIGH (ref 0–99)
Triglycerides: 162 mg/dL — ABNORMAL HIGH (ref 0–149)
VLDL Cholesterol Cal: 29 mg/dL (ref 5–40)

## 2023-04-25 NOTE — Telephone Encounter (Signed)
-----   Message from Olevia Perches sent at 04/24/2023 11:11 AM EDT ----- Nebulizer machine please dx COPD

## 2023-04-26 DIAGNOSIS — Z419 Encounter for procedure for purposes other than remedying health state, unspecified: Secondary | ICD-10-CM | POA: Diagnosis not present

## 2023-05-01 ENCOUNTER — Ambulatory Visit (INDEPENDENT_AMBULATORY_CARE_PROVIDER_SITE_OTHER): Payer: Medicaid Other | Admitting: Pulmonary Disease

## 2023-05-01 ENCOUNTER — Encounter: Payer: Self-pay | Admitting: Pulmonary Disease

## 2023-05-01 VITALS — BP 130/86 | HR 66 | Temp 97.8°F | Ht 62.0 in | Wt 177.6 lb

## 2023-05-01 DIAGNOSIS — G4733 Obstructive sleep apnea (adult) (pediatric): Secondary | ICD-10-CM | POA: Diagnosis not present

## 2023-05-01 DIAGNOSIS — J4489 Other specified chronic obstructive pulmonary disease: Secondary | ICD-10-CM | POA: Diagnosis not present

## 2023-05-01 DIAGNOSIS — J439 Emphysema, unspecified: Secondary | ICD-10-CM

## 2023-05-01 DIAGNOSIS — F17210 Nicotine dependence, cigarettes, uncomplicated: Secondary | ICD-10-CM | POA: Diagnosis not present

## 2023-05-01 NOTE — Progress Notes (Signed)
Subjective:    Patient ID: Monica Hull, female    DOB: Mar 17, 1961, 62 y.o.   MRN: [id number]  Patient Care Team: Dorcas Carrow, DO as PCP - General (Family Medicine) Antonieta Iba, MD as PCP - Cardiology (Cardiology) Salena Saner, MD as Consulting Physician (Pulmonary Disease)  Chief Complaint  Patient presents with   Follow-up    DOE. Occasional wheezing. Dry cough.     BACKGROUND/INTERVAL:This is a 62 year old female, current every day smoker currently smoking a pack a day, with 39-pack-year history of smoking. PMH significant for COPD, sleep apnea, allergic rhinitis, GERD, hypertension, carotid stenosis, diet controlled diabetes mellitus, hyperlipidemia.  I last saw the patient on 08 October 2022. She is maintained on Breztri 2 puffs twice a day.  Since her last visit she has not had any major exacerbations nor admissions to the hospital.  History of mild obstructive sleep apnea.  She is intolerant of CPAP.  HPI Discussed the use of AI scribe software for clinical note transcription with the patient, who gave verbal consent to proceed.  History of Present Illness   The patient, diagnosed with COPD, has been on Breztri and uses an albuterol nebulizer twice daily. She reports no issues with the Breztri. She continues to smoke about a pack a day and is to start Wellbutrin to aid in smoking cessation.  She has a history of sleep apnea but has not been able to tolerate CPAP. She reports frequent awakenings during the night, leading to daytime fatigue. The cause of these awakenings is unclear to the patient.  She has received her flu shot and COVID vaccination. She reports occasional fevers, chills, and sweats. She also experiences a persistent dry cough, which she attributes to her smoking habit. The cough occurs throughout the day and at night.  She has no complaints of chest pain or leg swelling. Her last scan showed evidence of COPD/emphysema.      DATA 09/26/2021 PFTs: FEV1 1.70 L or 71% predicted, FVC 2.13 L or 68% predicted, FEV1/FVC 80%, no bronchodilator response.  Lung volumes normal, diffusion capacity normal.  Patient had difficulty during the test.  No significant obstruction. 05/15/2022 sleep study, in lab: Mild obstructive sleep apnea AHI of 9.7 events per hour O2 sats 85%. 05/23/2022 LDCT chest: Mild centrilobular emphysema. Stable small bilateral pulmonary nodules largest 5.5 mm. Lung RADS 2, benign appearance or behavior. 08/21/2022 chest x-ray PA and lateral: No evidence of acute disease.  Review of Systems A 10 point review of systems was performed and it is as noted above otherwise negative.   Patient Active Problem List   Diagnosis Date Noted   Cyst on ear 03/27/2023   Bilateral hearing loss 03/27/2023   Aortic atherosclerosis (HCC) 04/08/2019   Syncope 03/24/2018   Allergic rhinitis 02/27/2017   Current smoker 02/20/2016   Carotid stenosis 02/16/2016   Thyroid nodule 11/01/2015   Type 2 diabetes mellitus with other specified complication (HCC) 08/18/2015   PMB (postmenopausal bleeding) 06/30/2015   Obesity (BMI 30.0-34.9) 06/30/2015   Paresthesias 02/01/2015   DJD (degenerative joint disease), cervical 02/01/2015   Stress incontinence 12/21/2014   Asthma    GERD (gastroesophageal reflux disease)    Sleep apnea    Hyperlipidemia    Hypertension    COPD (chronic obstructive pulmonary disease) (HCC)    Nicotine dependence, cigarettes, w unsp disorders     Social History   Tobacco Use   Smoking status: Every Day    Current packs/day:  1.00    Average packs/day: 1 pack/day for 39.0 years (39.0 ttl pk-yrs)    Types: Cigarettes   Smokeless tobacco: Never   Tobacco comments:    1 PPD- 05/01/2023 khj  Substance Use Topics   Alcohol use: Never    Allergies  Allergen Reactions   Oxycodone    Advair Hfa [Fluticasone-Salmeterol] Rash    Rash and thrush   Anoro Ellipta [Umeclidinium-Vilanterol] Rash     Causes patients tongue to break out   Biaxin [Clarithromycin] Nausea And Vomiting and Rash    Current Meds  Medication Sig   albuterol (PROVENTIL) (2.5 MG/3ML) 0.083% nebulizer solution Take 3 mLs (2.5 mg total) by nebulization every 6 (six) hours as needed for wheezing or shortness of breath.   albuterol (VENTOLIN HFA) 108 (90 Base) MCG/ACT inhaler Inhale 2 puffs into the lungs every 6 (six) hours as needed for wheezing or shortness of breath.   aspirin EC 81 MG tablet Take 81 mg by mouth daily. Swallow whole.   atorvastatin (LIPITOR) 80 MG tablet Take 1 tablet (80 mg total) by mouth daily.   benzonatate (TESSALON) 200 MG capsule Take 1 capsule (200 mg total) by mouth 2 (two) times daily as needed for cough.   Budeson-Glycopyrrol-Formoterol (BREZTRI AEROSPHERE) 160-9-4.8 MCG/ACT AERO Inhale 2 puffs into the lungs in the morning and at bedtime.   celecoxib (CELEBREX) 200 MG capsule Take 200 mg by mouth 2 (two) times daily.   cetirizine (ZYRTEC) 10 MG tablet Take 1 tablet (10 mg total) by mouth daily as needed for allergies.   diclofenac (VOLTAREN) 75 MG EC tablet Take 1 tablet (75 mg total) by mouth 2 (two) times daily.   fesoterodine (TOVIAZ) 8 MG TB24 tablet Take 1 tablet (8 mg total) by mouth daily.   fluticasone (FLONASE) 50 MCG/ACT nasal spray Place 2 sprays into both nostrils daily as needed for allergies.   LINZESS 290 MCG CAPS capsule Take 290 mcg by mouth daily.   lisinopril (ZESTRIL) 40 MG tablet Take 1 tablet (40 mg total) by mouth daily.   metFORMIN (GLUCOPHAGE-XR) 500 MG 24 hr tablet Take 2 tablets (1,000 mg total) by mouth daily with breakfast.   ofloxacin (OCUFLOX) 0.3 % ophthalmic solution Place into the right ear.   pantoprazole (PROTONIX) 40 MG tablet Take 1 tablet (40 mg total) by mouth every morning.   Semaglutide,0.25 or 0.5MG /DOS, (OZEMPIC, 0.25 OR 0.5 MG/DOSE,) 2 MG/3ML SOPN Inject 0.5 mg into the skin once a week.   Spacer/Aero-Holding Chambers (AEROCHAMBER MV) inhaler  Use as instructed   trolamine salicylate (ASPERCREME) 10 % cream Apply 1 application  topically as needed for muscle pain.    Immunization History  Administered Date(s) Administered   Influenza,inj,Quad PF,6+ Mos 03/07/2015, 02/29/2016, 05/09/2017, 03/24/2018, 03/05/2019, 04/14/2020, 04/25/2021   Influenza-Unspecified 03/25/2014, 04/07/2022, 02/24/2023   PFIZER(Purple Top)SARS-COV-2 Vaccination 09/10/2019, 10/06/2019, 04/14/2020   Pfizer(Comirnaty)Fall Seasonal Vaccine 12 years and older 06/14/2022   Pneumococcal Polysaccharide-23 09/17/2014   Respiratory Syncytial Virus Vaccine,Recomb Aduvanted(Arexvy) 04/07/2022   Tdap 12/21/2014   Unspecified SARS-COV-2 Vaccination 02/24/2023   Zoster Recombinant(Shingrix) 02/07/2021, 09/10/2022        Objective:     BP 130/86 (BP Location: Right Arm, Cuff Size: Normal)   Pulse 66   Temp 97.8 F (36.6 C)   Ht 5\' 2"  (1.575 m)   Wt 177 lb 9.6 oz (80.6 kg)   SpO2 98%   BMI 32.48 kg/m   SpO2: 98 % O2 Device: None (Room air)  GENERAL: Somewhat disheveled, overweight woman,  no acute distress, fully ambulatory, smells heavily of tobacco.  No conversational dyspnea.  Looks much older than stated age, plethoric. HEAD: Normocephalic, atraumatic.  EYES: Pupils equal, round, reactive to light.  No scleral icterus.  MOUTH: Very poor dentition, missing teeth.  Oral mucosa moist.  No thrush.  Edentulous. NECK: Supple. No thyromegaly. Trachea midline. No JVD.  No adenopathy. PULMONARY: Good air entry bilaterally.  Coarse breath sounds otherwise, no adventitious sounds. CARDIOVASCULAR: S1 and S2. Regular rate and rhythm.  No rubs, murmurs or gallops heard. ABDOMEN: Obese otherwise benign. MUSCULOSKELETAL: No joint deformity, no clubbing, no edema.  NEUROLOGIC: Neuro grossly normal. SKIN: Intact,warm,dry. PSYCH: Mood and behavior normal.     Assessment & Plan:     ICD-10-CM   1. COPD with chronic bronchitis and emphysema (HCC)  J44.89    J43.9      2. OSA (obstructive sleep apnea)  G47.33     3. Tobacco dependence due to cigarettes  F17.210      COPD Stable on Breztri. Using Albuterol nebulizer twice daily. Reports dry cough throughout the day and night. -Continue Breztri and Albuterol nebulizer as needed.  Tobacco Use Smoking 1 pack per day. Plans to start Wellbutrin for smoking cessation. -Start Wellbutrin as planned. -Consider nicotine patches in conjunction with Wellbutrin. Apply patch in the morning and remove before bed.  Sleep Apnea Unable to tolerate CPAP. Sleep disruption noted. -Consultation with [facility] is pending to discuss alternative treatments.  Arranged by primary care.   General Health Maintenance Up to date on flu and COVID vaccinations. Lung cancer screening scheduled for December 3rd. -Continue current vaccination status. -Complete scheduled lung cancer screening.  Follow-up in 3-4 months.  Sooner should any new problems arise.     Gailen Shelter, MD Advanced Bronchoscopy PCCM [facility]    *This note was generated using voice recognition software/Dragon and/or AI transcription program.  Despite best efforts to proofread, errors can occur which can change the meaning. Any transcriptional errors that result from this process are unintentional and may not be fully corrected at the time of dictation.

## 2023-05-01 NOTE — Patient Instructions (Signed)
VISIT SUMMARY:  During today's visit, we discussed your ongoing management of COPD, your smoking habits, and your sleep apnea. You are currently stable on your COPD medications, and we have a plan to help you quit smoking. We also addressed your sleep issues and recommended a consultation for further evaluation. Your vaccinations are up to date, and you have a lung cancer screening scheduled.  YOUR PLAN:  -COPD: Chronic Obstructive Pulmonary Disease (COPD) is a long-term lung condition that makes it hard to breathe. You are stable on your current medications, Breztri and Albuterol nebulizer. Please continue using them as prescribed.  -TOBACCO USE: Smoking is harmful to your lungs and overall health. You plan to start Wellbutrin to help you quit smoking. Additionally, consider using nicotine patches; apply one in the morning and remove it before bed. Since you smoke 1 pack a day you can start with a 21 mg patch.  -SLEEP APNEA: Sleep apnea is a condition where your breathing stops and starts during sleep. Since you are unable to tolerate CPAP, keep the consultation appointment with [facility] to explore other treatment options.  -GENERAL HEALTH MAINTENANCE: You are up to date on your flu and COVID vaccinations. You have a lung cancer screening scheduled for December 3rd. Please continue with your current vaccination status and complete the scheduled screening.  INSTRUCTIONS:  Please follow up in 3-4 months for a re-evaluation of your COPD, smoking cessation progress, and sleep apnea management. Complete your lung cancer screening on December 3rd.

## 2023-05-02 ENCOUNTER — Encounter: Payer: Self-pay | Admitting: Family Medicine

## 2023-05-13 DIAGNOSIS — H6981 Other specified disorders of Eustachian tube, right ear: Secondary | ICD-10-CM | POA: Diagnosis not present

## 2023-05-13 DIAGNOSIS — H6983 Other specified disorders of Eustachian tube, bilateral: Secondary | ICD-10-CM | POA: Diagnosis not present

## 2023-05-13 DIAGNOSIS — J310 Chronic rhinitis: Secondary | ICD-10-CM | POA: Diagnosis not present

## 2023-05-26 DIAGNOSIS — Z419 Encounter for procedure for purposes other than remedying health state, unspecified: Secondary | ICD-10-CM | POA: Diagnosis not present

## 2023-05-27 ENCOUNTER — Encounter: Payer: Self-pay | Admitting: Family Medicine

## 2023-05-27 ENCOUNTER — Telehealth (INDEPENDENT_AMBULATORY_CARE_PROVIDER_SITE_OTHER): Payer: Medicaid Other | Admitting: Family Medicine

## 2023-05-27 DIAGNOSIS — F17200 Nicotine dependence, unspecified, uncomplicated: Secondary | ICD-10-CM

## 2023-05-27 DIAGNOSIS — Z716 Tobacco abuse counseling: Secondary | ICD-10-CM

## 2023-05-27 DIAGNOSIS — F17210 Nicotine dependence, cigarettes, uncomplicated: Secondary | ICD-10-CM

## 2023-05-27 DIAGNOSIS — F17209 Nicotine dependence, unspecified, with unspecified nicotine-induced disorders: Secondary | ICD-10-CM

## 2023-05-27 DIAGNOSIS — Z1231 Encounter for screening mammogram for malignant neoplasm of breast: Secondary | ICD-10-CM

## 2023-05-27 NOTE — Assessment & Plan Note (Signed)
Doing well on the chantix. Has cut down about 1/2ppd. Will continue chantix and recheck in about 6 weeks. Call with any concerns.

## 2023-05-27 NOTE — Progress Notes (Signed)
There were no vitals taken for this visit.   Subjective:    Patient ID: Monica Hull, female    DOB: 1960-10-23, 62 y.o.   MRN: [id number]  HPI: Monica Hull is a 62 y.o. female  Chief Complaint  Patient presents with   Nicotine Dependence        SMOKING CESSATION Smoking Status: current every day smoker Smoking Amount: 1ppd Smoking Onset: 62yo Smoking Quit Date: not set Smoking triggers: stress Type of tobacco use: cigarettes Children in the house: yes Other household members who smoke: yes Treatments attempted: wellbutrin, nicoderm, chantix Pneumovax: UTD   Relevant past medical, surgical, family and social history reviewed and updated as indicated. Interim medical history since our last visit reviewed. Allergies and medications reviewed and updated.  Review of Systems  Constitutional: Negative.   Respiratory: Negative.    Cardiovascular: Negative.   Gastrointestinal: Negative.   Musculoskeletal: Negative.   Psychiatric/Behavioral: Negative.      Per HPI unless specifically indicated above     Objective:    There were no vitals taken for this visit.  Wt Readings from Last 3 Encounters:  05/01/23 177 lb 9.6 oz (80.6 kg)  04/24/23 178 lb 3.2 oz (80.8 kg)  03/27/23 176 lb 3.2 oz (79.9 kg)    Physical Exam Vitals and nursing note reviewed.  Constitutional:      General: She is not in acute distress.    Appearance: Normal appearance. She is obese. She is not ill-appearing, toxic-appearing or diaphoretic.  HENT:     Head: Normocephalic and atraumatic.     Right Ear: External ear normal.     Left Ear: External ear normal.     Nose: Nose normal.     Mouth/Throat:     Mouth: Mucous membranes are moist.     Pharynx: Oropharynx is clear.  Eyes:     General: No scleral icterus.       Right eye: No discharge.        Left eye: No discharge.     Conjunctiva/sclera: Conjunctivae normal.     Pupils: Pupils are equal, round, and reactive to  light.  Pulmonary:     Effort: Pulmonary effort is normal. No respiratory distress.     Comments: Speaking in full sentences Musculoskeletal:        General: Normal range of motion.     Cervical back: Normal range of motion.  Skin:    Coloration: Skin is not jaundiced or pale.     Findings: No bruising, erythema, lesion or rash.  Neurological:     Mental Status: She is alert and oriented to person, place, and time. Mental status is at baseline.  Psychiatric:        Mood and Affect: Mood normal.        Behavior: Behavior normal.        Thought Content: Thought content normal.        Judgment: Judgment normal.     Results for orders placed or performed in visit on 05/02/23  HM DIABETES EYE EXAM  Result Value Ref Range   HM Diabetic Eye Exam No Retinopathy No Retinopathy      Assessment & Plan:   Problem List Items Addressed This Visit       Other   Current smoker    Doing well on the chantix. Has cut down about 1/2ppd. Will continue chantix and recheck in about 6 weeks. Call with any concerns.  Other Visit Diagnoses     Encounter for screening mammogram for malignant neoplasm of breast    -  Primary   Mammogram ordered.   Relevant Orders   MM 3D SCREENING MAMMOGRAM BILATERAL BREAST   Tobacco use disorder, continuous       Tobacco abuse counseling       Encounter for smoking cessation counseling            Follow up plan: Return in about 6 weeks (around 07/08/2023).   This visit was completed via video visit through MyChart due to the restrictions of the COVID-19 pandemic. All issues as above were discussed and addressed. Physical exam was done as above through visual confirmation on video through MyChart. If it was felt that the patient should be evaluated in the office, they were directed there. The patient verbally consented to this visit. Location of the patient: home Location of the provider: work Those involved with this call:  Provider: Olevia Perches,  DO CMA: Malen Gauze, CMA Front Desk/Registration:  Servando Snare   Time spent on call:  15 minutes with patient face to face via video conference. More than 50% of this time was spent in counseling and coordination of care. 23 minutes total spent in review of patient's record and preparation of their chart.

## 2023-05-28 ENCOUNTER — Ambulatory Visit
Admission: RE | Admit: 2023-05-28 | Discharge: 2023-05-28 | Disposition: A | Discharge status: Home / Self Care | Payer: Medicaid Other | Source: Ambulatory Visit | Attending: Acute Care | Admitting: Acute Care

## 2023-05-28 ENCOUNTER — Telehealth: Payer: Self-pay

## 2023-05-28 DIAGNOSIS — K760 Fatty (change of) liver, not elsewhere classified: Secondary | ICD-10-CM | POA: Insufficient documentation

## 2023-05-28 DIAGNOSIS — J439 Emphysema, unspecified: Secondary | ICD-10-CM | POA: Insufficient documentation

## 2023-05-28 DIAGNOSIS — F17210 Nicotine dependence, cigarettes, uncomplicated: Secondary | ICD-10-CM | POA: Insufficient documentation

## 2023-05-28 DIAGNOSIS — Z87891 Personal history of nicotine dependence: Secondary | ICD-10-CM | POA: Insufficient documentation

## 2023-05-28 DIAGNOSIS — I700 Atherosclerosis of aorta: Secondary | ICD-10-CM | POA: Diagnosis not present

## 2023-05-28 DIAGNOSIS — I2510 Atherosclerotic heart disease of native coronary artery without angina pectoris: Secondary | ICD-10-CM | POA: Diagnosis not present

## 2023-05-28 DIAGNOSIS — Z122 Encounter for screening for malignant neoplasm of respiratory organs: Secondary | ICD-10-CM | POA: Diagnosis not present

## 2023-05-28 NOTE — Telephone Encounter (Signed)
-----   Message from Olevia Perches sent at 05/27/2023  4:26 PM EST ----- Please schedule mammogram any day or time

## 2023-05-28 NOTE — Telephone Encounter (Signed)
-----   Message from Olevia Perches sent at 05/27/2023  4:26 PM EST ----- Needs PA for ozempic

## 2023-05-28 NOTE — Telephone Encounter (Signed)
Patient is scheduled for Screening Mammogram on 07/23/23 at 1:00 pm at [facility] at [place] location. Patient notified via MyChart.

## 2023-05-28 NOTE — Telephone Encounter (Signed)
Prior authorization was initiated via CoverMyMeds for Ozempic 0.25 MG. Awaiting determination   KEY: [id number]

## 2023-06-06 ENCOUNTER — Telehealth: Payer: Self-pay | Admitting: *Deleted

## 2023-06-06 NOTE — Telephone Encounter (Signed)
Patient husband on DPR calling to review CT results from 05/28/23. No result note documented at this time. Please advise . Patient husband requesting call been when PCP has reviewed.

## 2023-06-07 NOTE — Telephone Encounter (Signed)
Called patient husband Dorinda Hill and was unable to leave a message due to voicemail box being full. Reached out to patient via MyChart to make them aware of CT imaging results update.

## 2023-06-13 ENCOUNTER — Other Ambulatory Visit: Payer: Self-pay | Admitting: Acute Care

## 2023-06-13 DIAGNOSIS — Z122 Encounter for screening for malignant neoplasm of respiratory organs: Secondary | ICD-10-CM

## 2023-06-13 DIAGNOSIS — Z87891 Personal history of nicotine dependence: Secondary | ICD-10-CM

## 2023-06-13 DIAGNOSIS — F17210 Nicotine dependence, cigarettes, uncomplicated: Secondary | ICD-10-CM

## 2023-06-25 ENCOUNTER — Ambulatory Visit: Payer: Medicaid Other | Admitting: Family Medicine

## 2023-06-26 DIAGNOSIS — Z419 Encounter for procedure for purposes other than remedying health state, unspecified: Secondary | ICD-10-CM | POA: Diagnosis not present

## 2023-06-29 ENCOUNTER — Other Ambulatory Visit: Payer: Self-pay | Admitting: Medical Genetics

## 2023-07-02 ENCOUNTER — Other Ambulatory Visit
Admission: RE | Admit: 2023-07-02 | Discharge: 2023-07-02 | Disposition: A | Discharge status: Home / Self Care | Payer: Self-pay | Source: Ambulatory Visit | Attending: Medical Genetics | Admitting: Medical Genetics

## 2023-07-06 NOTE — Progress Notes (Signed)
 Cardiology Office Note  Date:  07/08/2023   ID:  Monica Hull, DOB 13-Jan-1961, MRN [id number]  PCP:  Vicci Duwaine SQUIBB, DO   Chief Complaint  Patient presents with   12 month follow up     Patient c/o shortness of breath but feels due to COPD.     HPI:  Monica Hull is a 63 year-old woman with history of smoking,  GERD,  COPD, smokes 1- 1 1/2 ppd hypertension,  heavy carotid calcification on the right chest pain and syncope dating back to 2000  Stress test 2011, no ischemia COVID February 2023 Nonobstructive coronary disease on cardiac CTA December 2021 who presents for symptoms of chest pain and syncope.  Last seen by myself March 2023 Continues to smoke Smoking 1 up to 1.5 ppd Has a vapor cig, has not been using it  No regular exercise program Activity limited by shortness of breath  Has chronic arthritis shoulders b/l, knees  Having pain in her legs at nighttime, lots of muscle cramping  Denies chest pain concerning for angina  Work reviewed Total chol 180 LDL 109 A1C 7.0  CT chest 1. Coronary calcium  score of 249. This was 95th percentile for age and sex matched control. 2. Normal coronary origin with right dominance. 3. Calcified and non calcified plaque in the mid LAD causing moderate stenosis (50%). 4. Minimal calcifications (<25%) in the mid RCA and LCx arteries   EKG personally reviewed by myself on todays visit EKG Interpretation Date/Time:  Monday July 08 2023 08:49:58 EST Ventricular Rate:  66 PR Interval:  106 QRS Duration:  92 QT Interval:  434 QTC Calculation: 454 R Axis:   -7  Text Interpretation: Sinus rhythm with short PR When compared with ECG of 29-Dec-2021 13:24, No significant change was found Confirmed by Perla Lye [phone]) on 07/08/2023 9:01:01 AM   Past medical history reviewed  CT scan of the head and neck  Report details thyroid  nodule, sinus disease, mastoid disease  heavy carotid calcification on the  right No significant atherosclerosis in the aortic arch  On discussion concerning her smoking  tried Wellbutrin, uncertain if she tried Chantix , tried nicotine  patches She may try vapors   heavy snorer No previous sleep study  rare episodes of syncope dating back to 2000 Symptoms typically present with nausea of uncertain etiology, followed by lightheadedness and syncope. Suspected to be vasovagal   typically passes out for 5-10 minutes at a time. Lips go blue, she is limp. Eventually she comes to.   PMH:   has a past medical history of Arthritis, Asthma, Carpal tunnel syndrome, Chronic kidney disease, COPD (chronic obstructive pulmonary disease) (HCC), Diabetes mellitus without complication (HCC), Elevated liver function tests, GERD (gastroesophageal reflux disease), Headache, History of kidney stones, Hyperlipidemia, Hypertension, Impaired fasting glucose, Pneumonia, Sleep apnea, and Tobacco abuse.  PSH:    Past Surgical History:  Procedure Laterality Date   CARPAL TUNNEL RELEASE     CESAREAN SECTION     X2   DILATION AND CURETTAGE OF UTERUS     HYSTEROSCOPY WITH D & C N/A 07/11/2015   Procedure: DILATATION AND CURETTAGE /HYSTEROSCOPY;  Surgeon: Archie Savers, MD;  Location: [facility] ORS;  Service: Gynecology;  Laterality: N/A;   PALATE / UVULA BIOPSY / EXCISION     REVERSE SHOULDER ARTHROPLASTY Right 10/12/2021   Procedure: REVERSE TOTAL SHOULDER ARTHROPLASTY WITH BICEPS TENODESIS.;  Surgeon: Edie Norleen PARAS, MD;  Location: [facility] ORS;  Service: Orthopedics;  Laterality: Right;    Current  Outpatient Medications  Medication Sig Dispense Refill   albuterol  (PROVENTIL ) (2.5 MG/3ML) 0.083% nebulizer solution Take 3 mLs (2.5 mg total) by nebulization every 6 (six) hours as needed for wheezing or shortness of breath. 150 mL 1   albuterol  (VENTOLIN  HFA) 108 (90 Base) MCG/ACT inhaler Inhale 2 puffs into the lungs every 6 (six) hours as needed for wheezing or shortness of breath. 18 g 6    aspirin EC 81 MG tablet Take 81 mg by mouth daily. Swallow whole.     atorvastatin  (LIPITOR) 80 MG tablet Take 1 tablet (80 mg total) by mouth daily. 90 tablet 1   benzonatate  (TESSALON ) 200 MG capsule Take 1 capsule (200 mg total) by mouth 2 (two) times daily as needed for cough. 20 capsule 0   Budeson-Glycopyrrol-Formoterol  (BREZTRI  AEROSPHERE) 160-9-4.8 MCG/ACT AERO Inhale 2 puffs into the lungs in the morning and at bedtime. 32.1 g 1   celecoxib (CELEBREX) 200 MG capsule Take 200 mg by mouth 2 (two) times daily.     cetirizine  (ZYRTEC ) 10 MG tablet Take 1 tablet (10 mg total) by mouth daily as needed for allergies. 90 tablet 3   diclofenac  (VOLTAREN ) 75 MG EC tablet Take 1 tablet (75 mg total) by mouth 2 (two) times daily. 60 tablet 3   fesoterodine  (TOVIAZ ) 8 MG TB24 tablet Take 1 tablet (8 mg total) by mouth daily. 90 tablet 3   fluticasone  (FLONASE ) 50 MCG/ACT nasal spray Place 2 sprays into both nostrils daily as needed for allergies. 16 g 12   LINZESS  290 MCG CAPS capsule Take 290 mcg by mouth daily.     lisinopril  (ZESTRIL ) 40 MG tablet Take 1 tablet (40 mg total) by mouth daily. 90 tablet 1   metFORMIN  (GLUCOPHAGE -XR) 500 MG 24 hr tablet Take 2 tablets (1,000 mg total) by mouth daily with breakfast. 180 tablet 1   ofloxacin (OCUFLOX) 0.3 % ophthalmic solution Place into the right ear.     pantoprazole  (PROTONIX ) 40 MG tablet Take 1 tablet (40 mg total) by mouth every morning. 90 tablet 1   Semaglutide ,0.25 or 0.5MG /DOS, (OZEMPIC , 0.25 OR 0.5 MG/DOSE,) 2 MG/3ML SOPN Inject 0.5 mg into the skin once a week. 9 mL 1   Spacer/Aero-Holding Chambers (AEROCHAMBER MV) inhaler Use as instructed 1 each 0   trolamine salicylate (ASPERCREME) 10 % cream Apply 1 application  topically as needed for muscle pain.     Varenicline  Tartrate, Starter, (CHANTIX  STARTING MONTH PAK) 0.5 MG X 11 & 1 MG X 42 TBPK As directed (Patient not taking: Reported on 07/08/2023) 53 each 0   No current facility-administered  medications for this visit.     Allergies:   Oxycodone , Advair hfa [fluticasone -salmeterol], Anoro ellipta  [umeclidinium-vilanterol], and Biaxin [clarithromycin]   Social History:  The patient  reports that she has been smoking cigarettes. She has a 39 pack-year smoking history. She has never used smokeless tobacco. She reports that she does not drink alcohol and does not use drugs.   Family History:   family history includes Alcohol abuse in her father; Arthritis in her mother; Asthma in her mother; Breast cancer (age of onset: 41) in her maternal aunt; Breast cancer (age of onset: 54) in her maternal aunt; Diabetes in her brother and mother; Heart disease in her mother; Hyperlipidemia in her father and mother; Hypertension in her father and mother; Kidney disease in her mother; Lung disease in her mother; Pancreatic cancer in her mother.    Review of Systems: Review of Systems  Constitutional: Negative.   Respiratory:  Positive for shortness of breath.   Cardiovascular: Negative.   Gastrointestinal: Negative.   Musculoskeletal: Negative.   Neurological: Negative.   Psychiatric/Behavioral: Negative.    All other systems reviewed and are negative.   PHYSICAL EXAM: VS:  BP 120/80 (BP Location: Left Arm, Patient Position: Sitting, Cuff Size: Normal)   Pulse 66   Ht 5' 2 (1.575 m)   Wt 180 lb 6 oz (81.8 kg)   SpO2 96%   BMI 32.99 kg/m  , BMI Body mass index is 32.99 kg/m. Constitutional:  oriented to person, place, and time. No distress.  HENT:  Head: Grossly normal Eyes:  no discharge. No scleral icterus.  Neck: No JVD, no carotid bruits  Cardiovascular: Regular rate and rhythm, no murmurs appreciated Pulmonary/Chest: Clear to auscultation bilaterally, no wheezes or rails Abdominal: Soft.  no distension.  no tenderness.  Musculoskeletal: Normal range of motion Neurological:  normal muscle tone. Coordination normal. No atrophy Skin: Skin warm and dry Psychiatric: normal  affect, pleasant  Recent Labs: 09/10/2022: TSH 1.480 04/24/2023: ALT 28; BUN 8; Creatinine, Ser 0.65; Hemoglobin 14.7; Platelets 217; Potassium 4.2; Sodium 141    Lipid Panel Lab Results  Component Value Date   CHOL 180 04/24/2023   HDL 42 04/24/2023   LDLCALC 109 (H) 04/24/2023   TRIG 162 (H) 04/24/2023      Wt Readings from Last 3 Encounters:  07/08/23 180 lb 6 oz (81.8 kg)  05/01/23 177 lb 9.6 oz (80.6 kg)  04/24/23 178 lb 3.2 oz (80.8 kg)     ASSESSMENT AND PLAN:  Essential hypertension  Blood pressure is well controlled on today's visit. No changes made to the medications.  Carotid stenosis, right Repeat carotid ultrasound ordered, last was 2019 Risk factors include smoking, hyperlipidemia  emphysema (HCC) Smoking cessation recommended Discussed various strategies Followed by pulmonary  Coronary disease with stable angina Cardiac CTA performed December 2021, no significant stenosis  calcium  score 249, moderate stenosis mid LAD Denies chest pain concerning for angina Stressed importance of smoking cessation, aggressive cholesterol management  Diabetes type 2 with complications Calorie restriction recommended, goal A1c 6  Smoker We have encouraged her to continue to work on weaning her cigarettes and smoking cessation. She will continue to work on this and does not want any assistance with chantix .    Obesity (BMI 30.0-34.9) Recommend weight loss, low-carb  Hyperlipidemia Reports she is taking Lipitor 80 daily, reports having lots of leg cramping at nighttime Recommend she start Zetia  10 mg daily 2-week holiday from Lipitor, then restart Lipitor 40  Leg pain Unable to exclude claudication, lower extremity arterial Doppler ordered Cessation Possible statin myalgias plan as above    Orders Placed This Encounter  Procedures   EKG 12-Lead     Signed, Velinda Lunger, M.D., Ph.D. 07/08/2023  [facility] [place],  [place] [phone]  he

## 2023-07-08 ENCOUNTER — Ambulatory Visit: Payer: Medicaid Other | Attending: Cardiovascular Disease | Admitting: Cardiovascular Disease

## 2023-07-08 ENCOUNTER — Encounter: Payer: Self-pay | Admitting: Cardiovascular Disease

## 2023-07-08 VITALS — BP 120/80 | HR 66 | Ht 62.0 in | Wt 180.4 lb

## 2023-07-08 DIAGNOSIS — I739 Peripheral vascular disease, unspecified: Secondary | ICD-10-CM

## 2023-07-08 DIAGNOSIS — E1165 Type 2 diabetes mellitus with hyperglycemia: Secondary | ICD-10-CM | POA: Diagnosis not present

## 2023-07-08 DIAGNOSIS — I10 Essential (primary) hypertension: Secondary | ICD-10-CM | POA: Diagnosis not present

## 2023-07-08 DIAGNOSIS — I2510 Atherosclerotic heart disease of native coronary artery without angina pectoris: Secondary | ICD-10-CM | POA: Diagnosis not present

## 2023-07-08 DIAGNOSIS — I700 Atherosclerosis of aorta: Secondary | ICD-10-CM

## 2023-07-08 DIAGNOSIS — E782 Mixed hyperlipidemia: Secondary | ICD-10-CM | POA: Diagnosis not present

## 2023-07-08 DIAGNOSIS — R0609 Other forms of dyspnea: Secondary | ICD-10-CM

## 2023-07-08 DIAGNOSIS — F17200 Nicotine dependence, unspecified, uncomplicated: Secondary | ICD-10-CM

## 2023-07-08 DIAGNOSIS — I6521 Occlusion and stenosis of right carotid artery: Secondary | ICD-10-CM | POA: Diagnosis not present

## 2023-07-08 MED ORDER — EZETIMIBE 10 MG PO TABS: 10.0000 mg | ORAL_TABLET | Freq: Every day | ORAL | 3 refills | Status: DC

## 2023-07-08 MED ORDER — ATORVASTATIN CALCIUM 40 MG PO TABS: 40.0000 mg | ORAL_TABLET | Freq: Every day | ORAL | 3 refills | Status: DC

## 2023-07-08 NOTE — Patient Instructions (Addendum)
 Medication Instructions:  Please start zetia  10 mg daily for cholesterol  Please take a week or two off the atorvastatin  Then restart 1/2 pill (40 mg daily)  If you need a refill on your cardiac medications before your next appointment, please call your pharmacy.   Lab work: No new labs needed  Testing/Procedures:  Your physician has requested that you have a carotid duplex. This test is an ultrasound of the carotid arteries in your neck. It looks at blood flow through these arteries that supply the brain with blood.   Allow one hour for this exam.  There are no restrictions or special instructions.  This will take place at [address] [facility] [address]) [address], [place] [address]  Please note: We ask at that you not bring children with you during ultrasound (echo/ vascular) testing. Due to room size and safety concerns, children are not allowed in the ultrasound rooms during exams. Our front office staff cannot provide observation of children in our lobby area while testing is being conducted. An adult accompanying a patient to their appointment will only be allowed in the ultrasound room at the discretion of the ultrasound technician under special circumstances. We apologize for any inconvenience.   Your physician has requested that you have an ankle brachial index (ABI). During this test an ultrasound and blood pressure cuff are used to evaluate the arteries that supply the arms and legs with blood.  Allow thirty minutes for this exam.  There are no restrictions or special instructions.  This will take place at [address] [facility] [address]) [address], [place] [address]  Please note: We ask at that you not bring children with you during ultrasound (echo/ vascular) testing. Due to room size and safety concerns, children are not allowed in the ultrasound rooms during exams. Our front office staff cannot provide observation of children in our lobby area while testing is  being conducted. An adult accompanying a patient to their appointment will only be allowed in the ultrasound room at the discretion of the ultrasound technician under special circumstances. We apologize for any inconvenience.   Your physician has requested that you have a lower extremity arterial duplex. During this test, ultrasound is used to evaluate arterial blood flow in the legs. Allow one hour for this exam. There are no restrictions or special instructions. This will take place at [address] [facility] [address]) [address], [place] [address]  Please note: We ask at that you not bring children with you during ultrasound (echo/ vascular) testing. Due to room size and safety concerns, children are not allowed in the ultrasound rooms during exams. Our front office staff cannot provide observation of children in our lobby area while testing is being conducted. An adult accompanying a patient to their appointment will only be allowed in the ultrasound room at the discretion of the ultrasound technician under special circumstances. We apologize for any inconvenience.    Follow-Up: At [facility], you and your health needs are our priority.  As part of our continuing mission to provide you with exceptional heart care, we have created designated Provider Care Teams.  These Care Teams include your primary Cardiologist (physician) and Advanced Practice Providers (APPs -  Physician Assistants and Nurse Practitioners) who all work together to provide you with the care you need, when you need it.  You will need a follow up appointment in 12 months  Providers on your designated Care Team:   Lonni Meager, NP Bernardino Bring,  PA-C Cadence Furth, PA-C  COVID-19 Vaccine Information can be found at: [email or web address] For questions related to vaccine distribution or appointments, please email [email or web address] or call [phone].

## 2023-07-15 LAB — GENECONNECT MOLECULAR SCREEN: Genetic Analysis Overall Interpretation: NEGATIVE

## 2023-07-23 ENCOUNTER — Ambulatory Visit
Admission: RE | Admit: 2023-07-23 | Discharge: 2023-07-23 | Disposition: A | Discharge status: Home / Self Care | Payer: Medicaid Other | Source: Ambulatory Visit | Attending: Family Medicine | Admitting: Family Medicine

## 2023-07-23 DIAGNOSIS — Z1231 Encounter for screening mammogram for malignant neoplasm of breast: Secondary | ICD-10-CM | POA: Insufficient documentation

## 2023-07-24 ENCOUNTER — Ambulatory Visit (INDEPENDENT_AMBULATORY_CARE_PROVIDER_SITE_OTHER): Payer: Medicaid Other

## 2023-07-24 ENCOUNTER — Ambulatory Visit: Payer: Medicaid Other | Attending: Cardiovascular Disease

## 2023-07-24 DIAGNOSIS — I739 Peripheral vascular disease, unspecified: Secondary | ICD-10-CM

## 2023-07-24 DIAGNOSIS — I6521 Occlusion and stenosis of right carotid artery: Secondary | ICD-10-CM

## 2023-07-24 LAB — VAS US ABI WITH/WO TBI
Left ABI: 0.98
Right ABI: 1.02

## 2023-07-26 ENCOUNTER — Encounter: Payer: Self-pay | Admitting: Cardiovascular Disease

## 2023-07-27 DIAGNOSIS — Z419 Encounter for procedure for purposes other than remedying health state, unspecified: Secondary | ICD-10-CM | POA: Diagnosis not present

## 2023-07-31 ENCOUNTER — Encounter: Payer: Self-pay | Admitting: Family Medicine

## 2023-08-01 ENCOUNTER — Encounter: Payer: Self-pay | Admitting: Pulmonary Disease

## 2023-08-01 ENCOUNTER — Ambulatory Visit: Payer: Medicaid Other | Admitting: Pulmonary Disease

## 2023-08-01 ENCOUNTER — Ambulatory Visit: Payer: BC Managed Care – PPO | Admitting: Family Medicine

## 2023-08-01 VITALS — BP 124/82 | HR 76 | Temp 97.3°F | Ht 62.0 in | Wt 180.6 lb

## 2023-08-01 DIAGNOSIS — G4733 Obstructive sleep apnea (adult) (pediatric): Secondary | ICD-10-CM | POA: Diagnosis not present

## 2023-08-01 DIAGNOSIS — J439 Emphysema, unspecified: Secondary | ICD-10-CM | POA: Diagnosis not present

## 2023-08-01 DIAGNOSIS — J4489 Other specified chronic obstructive pulmonary disease: Secondary | ICD-10-CM

## 2023-08-01 DIAGNOSIS — F17210 Nicotine dependence, cigarettes, uncomplicated: Secondary | ICD-10-CM

## 2023-08-01 MED ORDER — LEVALBUTEROL TARTRATE 45 MCG/ACT IN AERO: 2.0000 | INHALATION_SPRAY | Freq: Four times a day (QID) | RESPIRATORY_TRACT | 3 refills | Status: AC | PRN

## 2023-08-01 MED ORDER — BREZTRI AEROSPHERE 160-9-4.8 MCG/ACT IN AERO: 2.0000 | INHALATION_SPRAY | Freq: Two times a day (BID) | RESPIRATORY_TRACT | 11 refills | Status: AC

## 2023-08-01 NOTE — Patient Instructions (Signed)
 VISIT SUMMARY:  Today, we discussed your COPD, ongoing tobacco use, and sleep apnea. We reviewed your current symptoms, medications, and lifestyle habits, and made some adjustments to your treatment plan to help manage your conditions more effectively.  YOUR PLAN:  -CHRONIC OBSTRUCTIVE PULMONARY DISEASE (COPD): COPD is a chronic lung condition that makes it hard to breathe. Your breathing is variable and influenced by weather changes. You are currently using the Breztri  inhaler, which you find helpful, and you use your rescue inhaler minimally. We discussed the importance of using the rescue inhaler before activities requiring exertion. We will refill your Breztri  inhaler and prescribe Xopenex  as your new rescue inhaler. Please use the rescue inhaler before any activities that require exertion. We will schedule a follow-up in 4-6 months.  -ONGOING TOBACCO USE: Continuing to smoke a pack of cigarettes per day is significantly impacting your COPD and overall health. Smoking cessation is crucial for managing your COPD and improving your overall health. We discussed the importance of quitting smoking and will continue to encourage you to stop.  -SLEEP APNEA: Sleep apnea is a condition where your breathing stops and starts during sleep. You have mild sleep apnea, which worsens when you sleep on your back. You are unable to tolerate CPAP therapy. We recommend positional therapy and weight loss as alternative treatments. Avoid sleeping on your back and use a supportive pillow for side sleeping. We also encourage weight loss to help manage your symptoms. We will check your nocturnal oxygen levels to ensure they are not dropping due to COPD.  INSTRUCTIONS:  Please schedule a follow-up appointment in 4-6 months. Additionally, we will check your nocturnal oxygen levels to ensure they are not dropping due to COPD.

## 2023-08-01 NOTE — Progress Notes (Signed)
 Subjective:    Patient ID: Monica Hull, female    DOB: June 23, 1961, 63 y.o.   MRN: [id number]  Patient Care Team: Vicci Duwaine SQUIBB, DO as PCP - General (Family Medicine) Perla Evalene PARAS, MD as PCP - Cardiology (Cardiology) Tamea Dedra CROME, MD as Consulting Physician (Pulmonary Disease)  Chief Complaint  Patient presents with   Follow-up    DOE. Occasional wheezing.Dry cough.    BACKGROUND/INTERVAL:This is a 63 year old female, current every day smoker currently smoking a pack a day, with 39-pack-year history of smoking. PMH significant for COPD, sleep apnea, allergic rhinitis, GERD, hypertension, carotid stenosis, diet controlled diabetes mellitus, hyperlipidemia.  I last saw the patient on 01 May 2023. She is maintained on Breztri  2 puffs twice a day.  Since her last visit she has not had any major exacerbations nor admissions to the hospital.  History of mild obstructive sleep apnea.  She is intolerant of CPAP.   HPI Discussed the use of AI scribe software for clinical note transcription with the patient, who gave verbal consent to proceed.  History of Present Illness   Monica Hull is a 63 year old female with COPD and sleep apnea who presents for follow-up on COPD and ongoing tobacco use.  Her breathing is variable, with some days better than others, and cold weather exacerbates her symptoms. She smokes a pack of cigarettes daily, despite efforts to reduce usage. She uses Breztri  for COPD management and finds it helpful, using her rescue inhaler and nebulizer infrequently. She experiences a cough without sputum production and occasional wheezing, but no fever or chills.  Regarding her sleep apnea, she is unable to tolerate CPAP due to a sensation of being smothered by the mask. She has not received a follow-up call regarding her sleep study. She sleeps on her side, alternating between the right and left, and avoids sleeping on her back, which  worsens her apnea. Her sleep apnea was described as mild.    DATA 09/26/2021 PFTs: FEV1 1.70 L or 71% predicted, FVC 2.13 L or 68% predicted, FEV1/FVC 80%, no bronchodilator response.  Lung volumes normal, diffusion capacity normal.  Patient had difficulty during the test.  No significant obstruction. 05/15/2022 sleep study, in lab: Mild obstructive sleep apnea AHI of 9.7 events per hour O2 sats 85%. 05/23/2022 LDCT chest: Mild centrilobular emphysema. Stable small bilateral pulmonary nodules largest 5.5 mm. Lung RADS 2, benign appearance or behavior. 08/21/2022 chest x-ray PA and lateral: No evidence of acute disease. 05/28/2023 LDCT chest: No pleural fluid, mild centrilobular emphysema.  No change in pulmonary nodules.  Lung RADS 2.  Review of Systems A 10 point review of systems was performed and it is as noted above otherwise negative.   Patient Active Problem List   Diagnosis Date Noted   Cyst on ear 03/27/2023   Bilateral hearing loss 03/27/2023   Aortic atherosclerosis (HCC) 04/08/2019   Syncope 03/24/2018   Allergic rhinitis 02/27/2017   Current smoker 02/20/2016   Carotid stenosis 02/16/2016   Thyroid  nodule 11/01/2015   Type 2 diabetes mellitus with other specified complication (HCC) 08/18/2015   PMB (postmenopausal bleeding) 06/30/2015   Obesity (BMI 30.0-34.9) 06/30/2015   Paresthesias 02/01/2015   DJD (degenerative joint disease), cervical 02/01/2015   Stress incontinence 12/21/2014   Asthma    GERD (gastroesophageal reflux disease)    Sleep apnea    Hyperlipidemia    Hypertension    COPD (chronic obstructive pulmonary disease) (HCC)    Nicotine   dependence, cigarettes, w unsp disorders     Social History   Tobacco Use   Smoking status: Every Day    Current packs/day: 1.00    Average packs/day: 1 pack/day for 39.0 years (39.0 ttl pk-yrs)    Types: Cigarettes   Smokeless tobacco: Never   Tobacco comments:    1 PPD- 07/31/2023 khj  Substance Use Topics    Alcohol use: Never    Allergies  Allergen Reactions   Oxycodone     Advair Hfa [Fluticasone -Salmeterol] Rash    Rash and thrush   Anoro Ellipta  [Umeclidinium-Vilanterol] Rash    Causes patients tongue to break out   Biaxin [Clarithromycin] Nausea And Vomiting and Rash    Current Meds  Medication Sig   albuterol  (PROVENTIL ) (2.5 MG/3ML) 0.083% nebulizer solution Take 3 mLs (2.5 mg total) by nebulization every 6 (six) hours as needed for wheezing or shortness of breath.   aspirin EC 81 MG tablet Take 81 mg by mouth daily. Swallow whole.   atorvastatin  (LIPITOR) 40 MG tablet Take 1 tablet (40 mg total) by mouth daily.   benzonatate  (TESSALON ) 200 MG capsule Take 1 capsule (200 mg total) by mouth 2 (two) times daily as needed for cough.   celecoxib (CELEBREX) 200 MG capsule Take 200 mg by mouth 2 (two) times daily.   cetirizine  (ZYRTEC ) 10 MG tablet Take 1 tablet (10 mg total) by mouth daily as needed for allergies.   diclofenac  (VOLTAREN ) 75 MG EC tablet Take 1 tablet (75 mg total) by mouth 2 (two) times daily.   ezetimibe  (ZETIA ) 10 MG tablet Take 1 tablet (10 mg total) by mouth daily.   fesoterodine  (TOVIAZ ) 8 MG TB24 tablet Take 1 tablet (8 mg total) by mouth daily.   fluticasone  (FLONASE ) 50 MCG/ACT nasal spray Place 2 sprays into both nostrils daily as needed for allergies.   levalbuterol  (XOPENEX  HFA) 45 MCG/ACT inhaler Inhale 2 puffs into the lungs every 6 (six) hours as needed for wheezing or shortness of breath.   LINZESS  290 MCG CAPS capsule Take 290 mcg by mouth daily.   lisinopril  (ZESTRIL ) 40 MG tablet Take 1 tablet (40 mg total) by mouth daily.   metFORMIN  (GLUCOPHAGE -XR) 500 MG 24 hr tablet Take 2 tablets (1,000 mg total) by mouth daily with breakfast.   ofloxacin (OCUFLOX) 0.3 % ophthalmic solution Place into the right ear.   pantoprazole  (PROTONIX ) 40 MG tablet Take 1 tablet (40 mg total) by mouth every morning.   Semaglutide ,0.25 or 0.5MG /DOS, (OZEMPIC , 0.25 OR 0.5  MG/DOSE,) 2 MG/3ML SOPN Inject 0.5 mg into the skin once a week.   Spacer/Aero-Holding Chambers (AEROCHAMBER MV) inhaler Use as instructed   trolamine salicylate (ASPERCREME) 10 % cream Apply 1 application  topically as needed for muscle pain.   [DISCONTINUED] albuterol  (VENTOLIN  HFA) 108 (90 Base) MCG/ACT inhaler Inhale 2 puffs into the lungs every 6 (six) hours as needed for wheezing or shortness of breath.   [DISCONTINUED] Budeson-Glycopyrrol-Formoterol  (BREZTRI  AEROSPHERE) 160-9-4.8 MCG/ACT AERO Inhale 2 puffs into the lungs in the morning and at bedtime.    Immunization History  Administered Date(s) Administered   Influenza,inj,Quad PF,6+ Mos 03/07/2015, 02/29/2016, 05/09/2017, 03/24/2018, 03/05/2019, 04/14/2020, 04/25/2021   Influenza-Unspecified 03/25/2014, 04/07/2022, 02/24/2023   PFIZER(Purple Top)SARS-COV-2 Vaccination 09/10/2019, 10/06/2019, 04/14/2020   Pfizer(Comirnaty)Fall Seasonal Vaccine 12 years and older 06/14/2022   Pneumococcal Polysaccharide-23 09/17/2014   Respiratory Syncytial Virus Vaccine,Recomb Aduvanted(Arexvy) 04/07/2022   Tdap 12/21/2014   Unspecified SARS-COV-2 Vaccination 02/24/2023   Zoster Recombinant(Shingrix ) 02/07/2021, 09/10/2022  Objective:     BP 124/82 (BP Location: Right Arm, Cuff Size: Normal)   Pulse 76   Temp (!) 97.3 F (36.3 C)   Ht 5' 2 (1.575 m)   Wt 180 lb 9.6 oz (81.9 kg)   SpO2 96%   BMI 33.03 kg/m   SpO2: 96 % O2 Device: None (Room air)  GENERAL: Somewhat disheveled, overweight woman, no acute distress, fully ambulatory, smells heavily of tobacco.  No conversational dyspnea.  Looks much older than stated age, plethoric. HEAD: Normocephalic, atraumatic.  EYES: Pupils equal, round, reactive to light.  No scleral icterus.  MOUTH: Edentulous.  Oral mucosa moist.  No thrush.  Edentulous. NECK: Supple. No thyromegaly. Trachea midline. No JVD.  No adenopathy. PULMONARY: Good air entry bilaterally.  Coarse breath sounds  otherwise, no adventitious sounds. CARDIOVASCULAR: S1 and S2. Regular rate and rhythm.  No rubs, murmurs or gallops heard. ABDOMEN: Obese otherwise benign. MUSCULOSKELETAL: No joint deformity, no clubbing, no edema.  NEUROLOGIC: Neuro grossly normal. SKIN: Intact,warm,dry. PSYCH: Mood and behavior normal. :     Assessment & Plan:     ICD-10-CM   1. OSA (obstructive sleep apnea)  G47.33 Overnight Pulse Oximetry Study    2. COPD with chronic bronchitis and emphysema (HCC)  J44.89 Overnight Pulse Oximetry Study   J43.9     3. Tobacco dependence due to cigarettes  F17.210       Orders Placed This Encounter  Procedures   Overnight Pulse Oximetry Study    On room air IFZ:Jijeu    Standing Status:   Future    Expiration Date:   07/31/2024    Meds ordered this encounter  Medications   Budeson-Glycopyrrol-Formoterol  (BREZTRI  AEROSPHERE) 160-9-4.8 MCG/ACT AERO    Sig: Inhale 2 puffs into the lungs in the morning and at bedtime.    Dispense:  10.7 g    Refill:  11    This replaces Symbicort  plus Spiriva    levalbuterol  (XOPENEX  HFA) 45 MCG/ACT inhaler    Sig: Inhale 2 puffs into the lungs every 6 (six) hours as needed for wheezing or shortness of breath.    Dispense:  15 each    Refill:  3    Discussion:    Chronic Obstructive Pulmonary Disease (COPD)   Presents for follow-up on COPD. Reports variable breathing influenced by weather changes. Continues to smoke a pack of cigarettes per day. Currently using Breztri  inhaler, which she finds helpful. Uses the rescue inhaler minimally and does not use the nebulizer. Physical exam reveals wheezing without productive cough, fever, or chills. Recent lung cancer screening in December was without any new findings. Discussed the importance of using the rescue inhaler before activities requiring exertion.   - Refill Breztri  inhaler   - Prescribe Xopenex  as rescue inhaler   - Advise use of rescue inhaler before activities requiring exertion    - Schedule follow-up in 4-6 months    Ongoing Tobacco Use   Continues to smoke a pack of cigarettes per day despite efforts to quit. Smoking cessation is crucial for managing COPD and overall health. Discussed the significant impact of smoking on COPD and overall health, emphasizing the importance of cessation.   - Continue to encourage smoking cessation    Sleep Apnea   Has mild sleep apnea, primarily exacerbated when sleeping on her back. Unable to tolerate CPAP due to feelings of smothering. Positional therapy and weight loss recommended as alternative treatments. Discussed the benefits of positional therapy and weight loss in managing sleep  apnea. Explained that avoiding sleeping on her back and using a supportive pillow for side sleeping can help manage symptoms.   - Advise to sleep on her side   - Recommend supportive pillow for side sleeping   - Encourage weight loss   - Check nocturnal oxygen levels to ensure they are not dropping due to COPD    Follow-up   - Schedule follow-up appointment in 4-6 months.     Advised if symptoms do not improve or worsen, to please contact office for sooner follow up or seek emergency care.    I spent 30 minutes of dedicated to the care of this patient on the date of this encounter to include pre-visit review of records, face-to-face time with the patient discussing conditions above, post visit ordering of testing, clinical documentation with the electronic health record, making appropriate referrals as documented, and communicating necessary findings to members of the patients care team.     C. Leita Sanders, MD Advanced [facility]ch[facility]-    *This note was generated using voice recognition software/Dragon and/or AI transcription program.  Despite best efforts to proofread, errors can occur which can change the meaning. Any transcriptional errors that result from this process are unintentional and may not be fully  corrected at the time of dictation.

## 2023-08-11 IMAGING — MR MR SHOULDER*R* W/O CM
4 of 5 series · 30 of 40 positions shown · non-contrast
Comparison: Radiographs dated November 15, 2011; CT chest dated [place]

CLINICAL DATA: Right shoulder pain

EXAM:
MRI OF THE RIGHT SHOULDER WITHOUT CONTRAST
TECHNIQUE: Multiplanar, multisequence MR imaging of the shoulder was performed.
No intravenous contrast was administered.

[Series 8: PD · oblique · right · 4.0mm · 0.55mm/px · 8 of 25 slices shown]
[im 1/25]
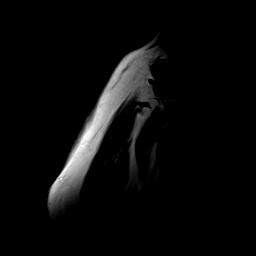
[im 3/25]
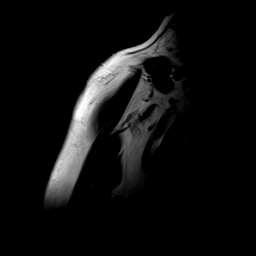
[im 9/25]
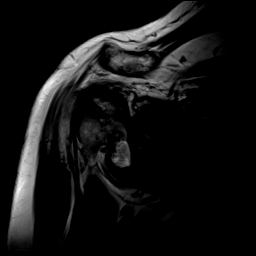
[im 11/25]
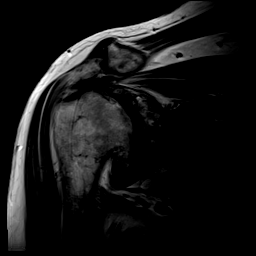
[im 14/25]
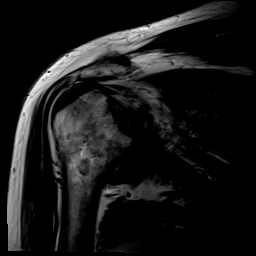
[im 17/25]
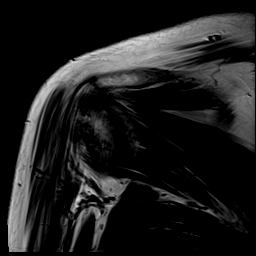
[im 22/25]
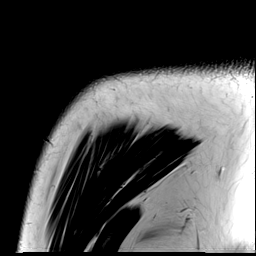
[im 25/25]
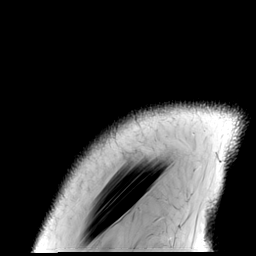

[Series 9: T2 fat-sat · oblique · right · 4.0mm · 0.27mm/px · 7 of 19 slices shown (1 of 3)]
[im 1/19]
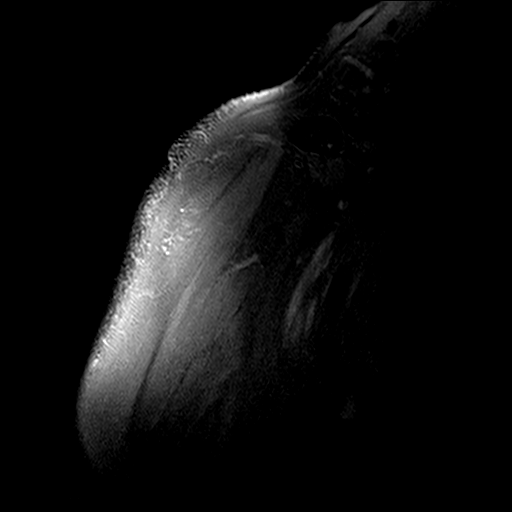
[im 4/19]
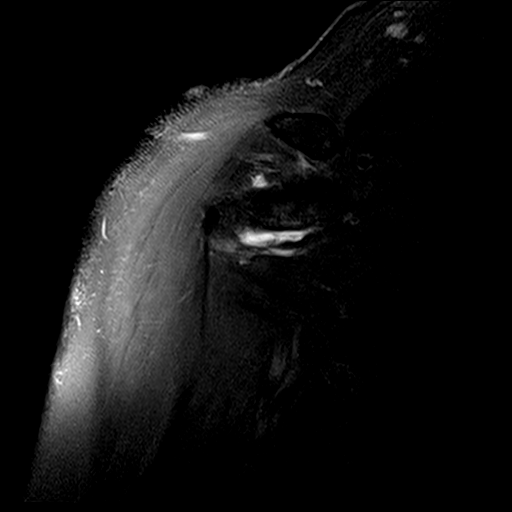
[im 7/19]
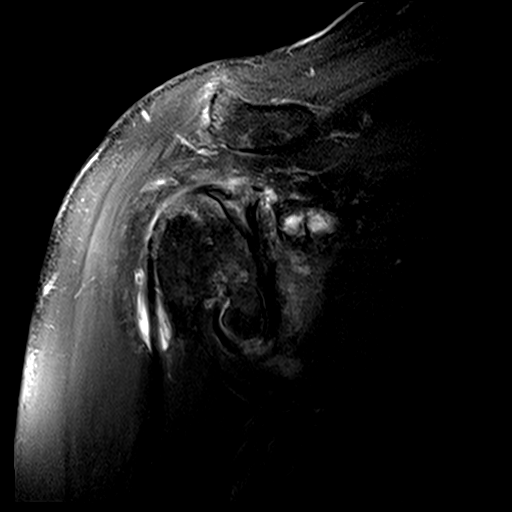
[im 10/19]
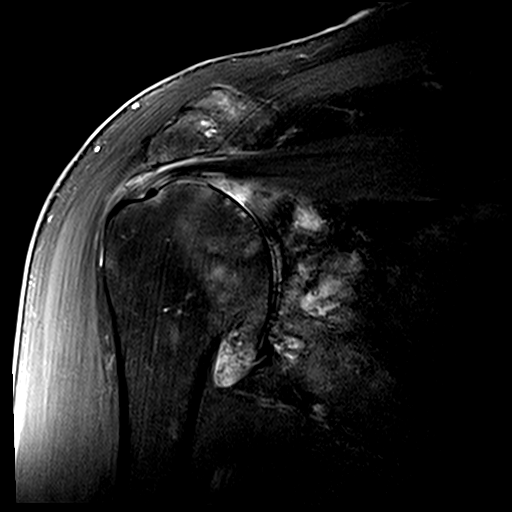
[im 13/19]
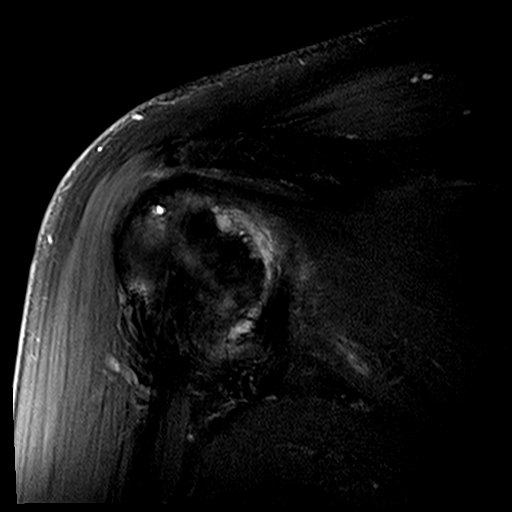
[im 16/19]
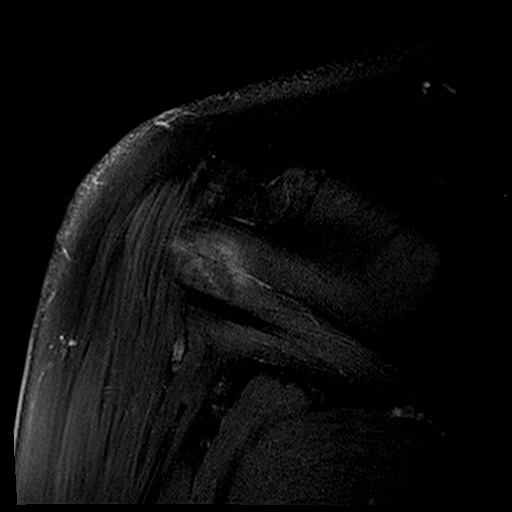
[im 19/19]
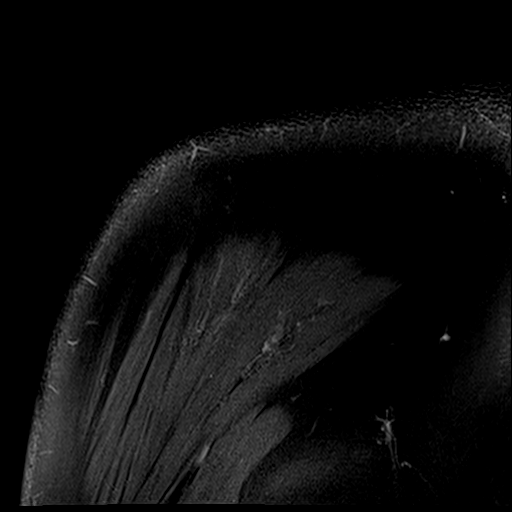

[Series 10: T2 fat-sat · oblique · right · 4.0mm · 0.55mm/px · 8 of 20 slices shown (2 of 3)]
[im 1/20]
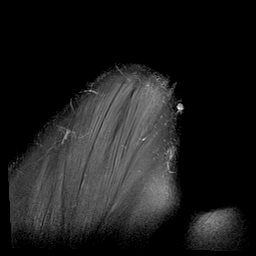
[im 3/20]
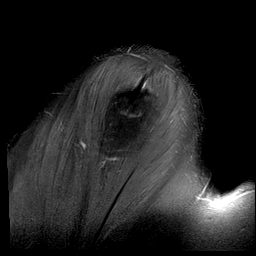
[im 6/20]
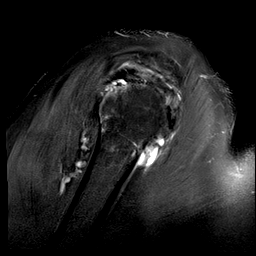
[im 9/20]
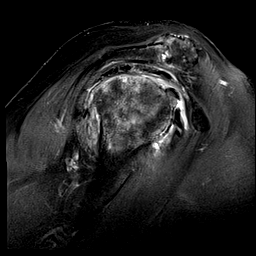
[im 11/20]
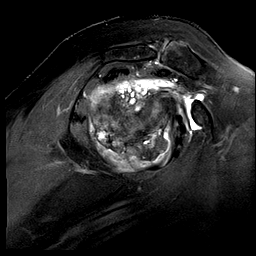
[im 14/20]
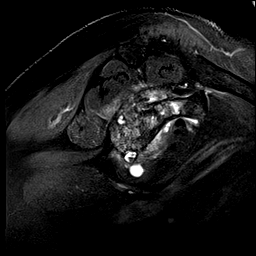
[im 17/20]
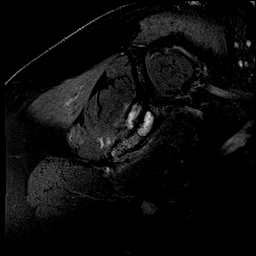
[im 20/20]
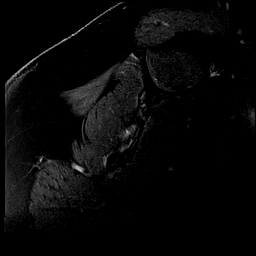

[Series 12: T2 fat-sat · axial · right · 4.0mm · 0.27mm/px · z∈[-65,+22]mm · 7 of 22 slices shown (3 of 3)]
[im 1/22]
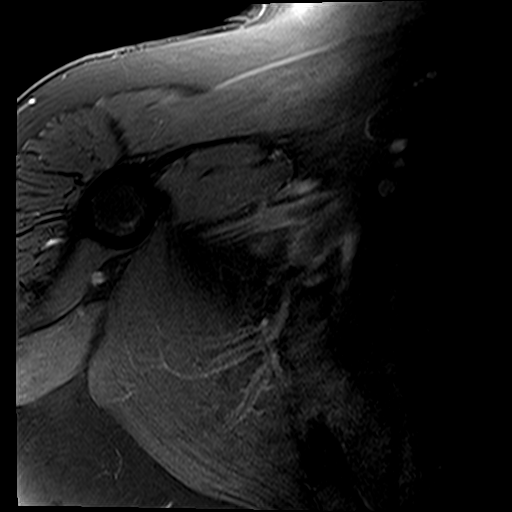
[im 4/22]
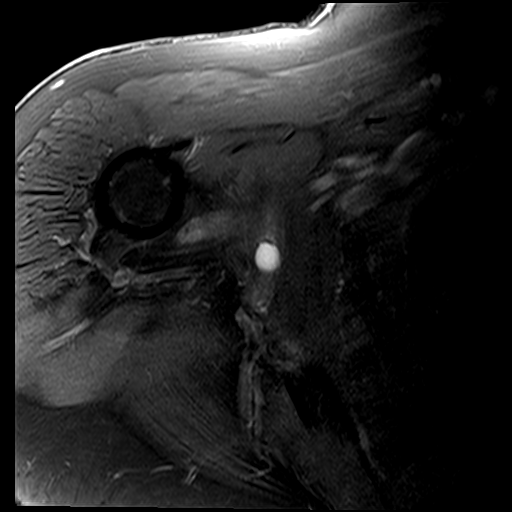
[im 7/22]
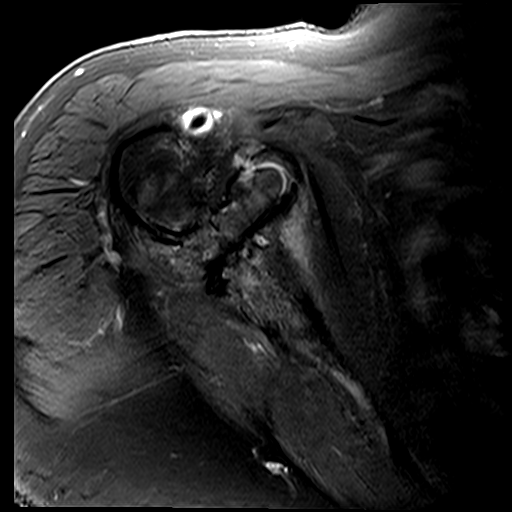
[im 10/22]
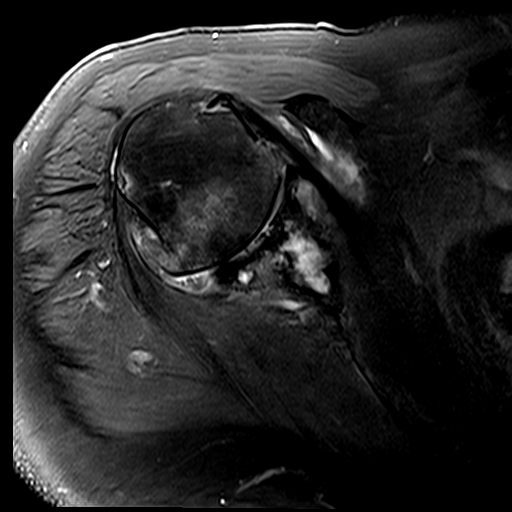
[im 13/22]
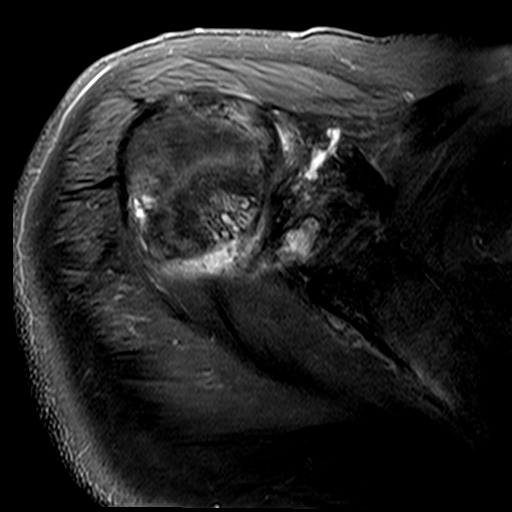
[im 16/22]
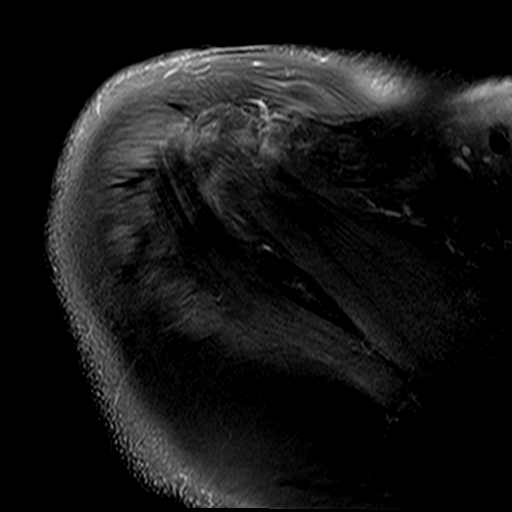
[im 19/22]
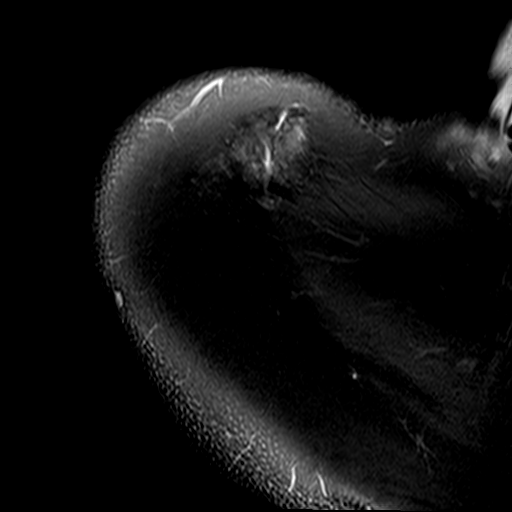

[30 of 40 positions shown; findings below may reference images not displayed]

FINDINGS: Rotator cuff: Tendinopathy of the supraspinatus with
partial-thickness bursal surface tear. Tendinopathy of the
infraspinatus tendon without discrete tear. Teres minor tendon is
intact. Subscapularis tendon is intact.

Muscles: No muscle atrophy or edema. No intramuscular fluid
collection or hematoma.

Biceps Long Head: Intraarticular and extraarticular portions of the
biceps tendon are intact.

Acromioclavicular Joint: Moderate arthropathy of the
acromioclavicular joint. No subacromial/subdeltoid bursal fluid.

Glenohumeral Joint: Complete loss of the articular cartilage with
advanced degenerative changes of the labrum. There is bone-on-bone
articulation and prominent marginal osteophytes. There are
subchondral cystic changes of the humeral head and glenoid.

Labrum: Advanced degenerative changes of the labrum, chronic
process.

Bones: No fracture or dislocation. No aggressive osseous lesion.
Prominent subchondral cystic changes of the glenoid and humeral
head. Large humeral head and neck junction osteophytes.

Other: No fluid collection or hematoma.
IMPRESSION: 1. Severe glenohumeral osteoarthritis with loss of articular
cartilage, advanced degenerative changes of the labrum, bone-on-bone
articulation with subchondral cystic changes and large humeral head
neck junction osteophytes.
2. Moderate acromioclavicular osteoarthritis.
3. Mild tendinopathy of the supraspinatus and infraspinatus tendon
without discrete tear.

## 2023-08-12 DIAGNOSIS — M19012 Primary osteoarthritis, left shoulder: Secondary | ICD-10-CM | POA: Diagnosis not present

## 2023-08-12 DIAGNOSIS — G8929 Other chronic pain: Secondary | ICD-10-CM | POA: Diagnosis not present

## 2023-08-19 DIAGNOSIS — R1319 Other dysphagia: Secondary | ICD-10-CM | POA: Diagnosis not present

## 2023-08-19 DIAGNOSIS — K219 Gastro-esophageal reflux disease without esophagitis: Secondary | ICD-10-CM | POA: Diagnosis not present

## 2023-08-19 DIAGNOSIS — K5909 Other constipation: Secondary | ICD-10-CM | POA: Diagnosis not present

## 2023-08-24 DIAGNOSIS — Z419 Encounter for procedure for purposes other than remedying health state, unspecified: Secondary | ICD-10-CM | POA: Diagnosis not present

## 2023-09-02 DIAGNOSIS — J310 Chronic rhinitis: Secondary | ICD-10-CM | POA: Diagnosis not present

## 2023-09-02 DIAGNOSIS — H6983 Other specified disorders of Eustachian tube, bilateral: Secondary | ICD-10-CM | POA: Diagnosis not present

## 2023-09-09 DIAGNOSIS — R200 Anesthesia of skin: Secondary | ICD-10-CM | POA: Diagnosis not present

## 2023-09-11 ENCOUNTER — Telehealth: Payer: Self-pay

## 2023-09-11 NOTE — Telephone Encounter (Signed)
 ONO reviewed by Dr. Jayme Cloud-  Low SpO2 85%. Only of desat <88%. Will continue to monitor. No O2 for now.   I called the patient. She was not able to come to the phone. I will try her back later.

## 2023-09-11 NOTE — Telephone Encounter (Signed)
 I have notified the patient. Nothing further needed.

## 2023-09-27 ENCOUNTER — Encounter: Payer: Self-pay | Admitting: Gastroenterology

## 2023-09-30 ENCOUNTER — Encounter: Payer: Self-pay | Admitting: Gastroenterology

## 2023-10-05 DIAGNOSIS — Z419 Encounter for procedure for purposes other than remedying health state, unspecified: Secondary | ICD-10-CM | POA: Diagnosis not present

## 2023-10-07 ENCOUNTER — Other Ambulatory Visit: Payer: Self-pay | Admitting: Surgery

## 2023-10-07 DIAGNOSIS — M7582 Other shoulder lesions, left shoulder: Secondary | ICD-10-CM

## 2023-10-07 DIAGNOSIS — E1169 Type 2 diabetes mellitus with other specified complication: Secondary | ICD-10-CM | POA: Diagnosis not present

## 2023-10-07 DIAGNOSIS — M19012 Primary osteoarthritis, left shoulder: Secondary | ICD-10-CM | POA: Diagnosis not present

## 2023-10-07 DIAGNOSIS — G5601 Carpal tunnel syndrome, right upper limb: Secondary | ICD-10-CM | POA: Diagnosis not present

## 2023-10-10 ENCOUNTER — Encounter
Admission: RE | Disposition: A | Discharge status: Home / Self Care | Payer: Self-pay | Source: Home / Self Care | Attending: Gastroenterology

## 2023-10-10 ENCOUNTER — Encounter: Payer: Self-pay | Admitting: Anesthesiology

## 2023-10-10 ENCOUNTER — Ambulatory Visit
Admission: RE | Admit: 2023-10-10 | Discharge: 2023-10-10 | Disposition: A | Discharge status: Home / Self Care | Payer: Medicaid Other | Attending: Gastroenterology | Admitting: Gastroenterology

## 2023-10-10 ENCOUNTER — Encounter: Payer: Self-pay | Admitting: Gastroenterology

## 2023-10-10 DIAGNOSIS — K219 Gastro-esophageal reflux disease without esophagitis: Secondary | ICD-10-CM | POA: Diagnosis not present

## 2023-10-10 DIAGNOSIS — K5909 Other constipation: Secondary | ICD-10-CM | POA: Diagnosis not present

## 2023-10-10 DIAGNOSIS — Z538 Procedure and treatment not carried out for other reasons: Secondary | ICD-10-CM | POA: Insufficient documentation

## 2023-10-10 DIAGNOSIS — R1314 Dysphagia, pharyngoesophageal phase: Secondary | ICD-10-CM | POA: Insufficient documentation

## 2023-10-10 HISTORY — PX: ESOPHAGOGASTRODUODENOSCOPY (EGD) WITH PROPOFOL: SHX5813

## 2023-10-10 HISTORY — PX: COLONOSCOPY WITH PROPOFOL: SHX5780

## 2023-10-10 SURGERY — COLONOSCOPY WITH PROPOFOL
Anesthesia: General

## 2023-10-10 MED ORDER — LIDOCAINE HCL (PF) 2 % IJ SOLN
INTRAMUSCULAR | Status: AC
  Filled 2023-10-10: qty 5

## 2023-10-10 MED ORDER — GLYCOPYRROLATE 0.2 MG/ML IJ SOLN
INTRAMUSCULAR | Status: AC
  Filled 2023-10-10: qty 1

## 2023-10-10 MED ORDER — PROPOFOL 1000 MG/100ML IV EMUL
INTRAVENOUS | Status: AC
  Filled 2023-10-10: qty 100

## 2023-10-10 MED ORDER — SODIUM CHLORIDE 0.9 % IV SOLN
INTRAVENOUS | Status: DC
Start: 2023-10-10 — End: 2023-10-10

## 2023-10-10 MED ORDER — DEXMEDETOMIDINE HCL IN NACL 80 MCG/20ML IV SOLN
INTRAVENOUS | Status: AC
  Filled 2023-10-10: qty 20

## 2023-10-10 NOTE — H&P (Signed)
 Patient to be rescheduled 2/2 inability to finish prep and continued brown stools.  Monica Newton, DO [facility]

## 2023-10-10 NOTE — Progress Notes (Signed)
 Patient here for EGD and colonoscopy. Didn't finish 2nd part of colon prep d/t nausea. Brown discharge still. Dr. Mamie Searles notified. Gave patient choice to do EGD and not colonoscopy. She chose to come back for both and will ask for Zofran and a different kind of colon prep.

## 2023-10-14 ENCOUNTER — Other Ambulatory Visit: Payer: Self-pay | Admitting: Surgery

## 2023-10-15 ENCOUNTER — Encounter
Admission: RE | Admit: 2023-10-15 | Discharge: 2023-10-15 | Disposition: A | Discharge status: Home / Self Care | Source: Ambulatory Visit | Attending: Surgery | Admitting: Surgery

## 2023-10-15 ENCOUNTER — Other Ambulatory Visit: Payer: Self-pay

## 2023-10-15 ENCOUNTER — Encounter: Payer: Self-pay | Admitting: Urgent Care

## 2023-10-15 ENCOUNTER — Ambulatory Visit
Admission: RE | Admit: 2023-10-15 | Discharge: 2023-10-15 | Disposition: A | Discharge status: Home / Self Care | Source: Ambulatory Visit | Attending: Surgery | Admitting: Surgery

## 2023-10-15 VITALS — Ht 62.0 in | Wt 180.0 lb

## 2023-10-15 DIAGNOSIS — M19012 Primary osteoarthritis, left shoulder: Secondary | ICD-10-CM | POA: Diagnosis not present

## 2023-10-15 DIAGNOSIS — J418 Mixed simple and mucopurulent chronic bronchitis: Secondary | ICD-10-CM | POA: Insufficient documentation

## 2023-10-15 DIAGNOSIS — E1169 Type 2 diabetes mellitus with other specified complication: Secondary | ICD-10-CM | POA: Insufficient documentation

## 2023-10-15 DIAGNOSIS — Z01812 Encounter for preprocedural laboratory examination: Secondary | ICD-10-CM | POA: Insufficient documentation

## 2023-10-15 DIAGNOSIS — M2540 Effusion, unspecified joint: Secondary | ICD-10-CM | POA: Diagnosis not present

## 2023-10-15 DIAGNOSIS — M7582 Other shoulder lesions, left shoulder: Secondary | ICD-10-CM | POA: Insufficient documentation

## 2023-10-15 DIAGNOSIS — I700 Atherosclerosis of aorta: Secondary | ICD-10-CM | POA: Diagnosis not present

## 2023-10-15 DIAGNOSIS — Z01818 Encounter for other preprocedural examination: Secondary | ICD-10-CM

## 2023-10-15 HISTORY — DX: Type 2 diabetes mellitus with unspecified complications: E11.8

## 2023-10-15 LAB — BASIC METABOLIC PANEL WITH GFR
Anion gap: 8 (ref 5–15)
BUN: 8 mg/dL (ref 8–23)
CO2: 28 mmol/L (ref 22–32)
Calcium: 9.4 mg/dL (ref 8.9–10.3)
Chloride: 104 mmol/L (ref 98–111)
Creatinine, Ser: 0.72 mg/dL (ref 0.44–1.00)
GFR, Estimated: >60 mL/min (ref >60)
Glucose, Bld: 126 mg/dL — ABNORMAL HIGH (ref 70–99)
Potassium: 3.6 mmol/L (ref 3.5–5.1)
Sodium: 140 mmol/L (ref 135–145)

## 2023-10-15 LAB — CBC
HCT: 46.2 % — ABNORMAL HIGH (ref 36.0–46.0)
Hemoglobin: 15.6 g/dL — ABNORMAL HIGH (ref 12.0–15.0)
MCH: 30.1 pg (ref 26.0–34.0)
MCHC: 33.8 g/dL (ref 30.0–36.0)
MCV: 89 fL (ref 80.0–100.0)
Platelets: 251 10*3/uL (ref 150–400)
RBC: 5.19 MIL/uL — ABNORMAL HIGH (ref 3.87–5.11)
RDW: 13 % (ref 11.5–15.5)
WBC: 7.6 10*3/uL (ref 4.0–10.5)
nRBC: 0 % (ref 0.0–0.2)

## 2023-10-15 NOTE — Pre-Procedure Instructions (Signed)
 Surgery scheduled Thurs 4/24 patient took Ozempic  Friday 4/18. Notified surgeon and surgeon office. Surgery may be postponed.

## 2023-10-15 NOTE — Pre-Procedure Instructions (Signed)
 Patient surgery rescheduled to 10/22/23. New instructions given. Patient aware.

## 2023-10-15 NOTE — Patient Instructions (Addendum)
 Your procedure is scheduled on:  Tuesday 10/22/23  Report to the Registration Desk on the 1st floor of the [facility]. To find out your arrival time, please call [phone] between 1PM - 3PM on: Monday 10/21/23  If your arrival time is 6:00 am, do not arrive before that time as the [facility] entrance doors do not open until 6:00 am.  REMEMBER: Instructions that are not followed completely may result in serious medical risk, up to and including death; or upon the discretion of your surgeon and anesthesiologist your surgery may need to be rescheduled.  Do not eat food after midnight the night before surgery.  No gum chewing or hard candies.  You may however, drink Water up to 2 hours before you are scheduled to arrive for your surgery. Do not drink anything within 2 hours of your scheduled arrival time.   In addition, your doctor has ordered for you to drink the provided:  Gatorade G2 Drinking this carbohydrate drink up to two hours before surgery helps to reduce insulin resistance and improve patient outcomes. Please complete drinking 2 hours before scheduled arrival time.  One week prior to surgery: Stop Anti-inflammatories (NSAIDS) such as Advil, Aleve, Ibuprofen, Motrin, Naproxen, Naprosyn and Aspirin based products such as Excedrin, Goody's Powder, BC Powder. Stop ANY OVER THE COUNTER supplements until after surgery.  You may however, continue to take Tylenol  if needed for pain up until the day of surgery.  Semaglutide ,0.25 or 0.5MG /DOS, (OZEMPIC ) Hold 7 days before surgery.  Continue taking all of your other prescription medications up until the day of surgery.  ON THE DAY OF SURGERY ONLY TAKE THESE MEDICATIONS WITH SIPS OF WATER:  albuterol  (PROVENTIL ) nebulizer  atorvastatin  (LIPITOR)  Budeson-Glycopyrrol-Formoterol  (BREZTRI  AEROSPHERE)  pantoprazole  (PROTONIX )   Use inhalers on the day of surgery and bring to the hospital.  No Alcohol for 24 hours before or after  surgery.  No Smoking including e-cigarettes for 24 hours before surgery.  No chewable tobacco products for at least 6 hours before surgery.  No nicotine  patches on the day of surgery.  Do not use any "recreational" drugs for at least a week (preferably 2 weeks) before your surgery.  Please be advised that the combination of cocaine and anesthesia may have negative outcomes, up to and including death. If you test positive for cocaine, your surgery will be cancelled.  On the morning of surgery brush your teeth with toothpaste and water, you may rinse your mouth with mouthwash if you wish. Do not swallow any toothpaste or mouthwash.  Use CHG Soap as directed on instruction sheet.  Do not wear jewelry, make-up, hairpins, clips or nail polish.  For welded (permanent) jewelry: bracelets, anklets, waist bands, etc.  Please have this removed prior to surgery.  If it is not removed, there is a chance that hospital personnel will need to cut it off on the day of surgery.  Do not wear lotions, powders, or perfumes.   Do not shave body hair from the neck down 48 hours before surgery.  Contact lenses, hearing aids and dentures may not be worn into surgery.  Do not bring valuables to the hospital. [facility] is not responsible for any missing/lost belongings or valuables.   Notify your doctor if there is any change in your medical condition (cold, fever, infection).  Wear comfortable clothing (specific to your surgery type) to the hospital.  After surgery, you can help prevent lung complications by doing breathing exercises.  Take deep breaths  and cough every 1-2 hours. Your doctor may order a device called an Incentive Spirometer to help you take deep breaths.  If you are being discharged the day of surgery, you will not be allowed to drive home. You will need a responsible individual to drive you home and stay with you for 24 hours after surgery.   If you are taking public transportation,  you will need to have a responsible individual with you.  Please call the Pre-admissions Testing Dept. at [phone] if you have any questions about these instructions.  Surgery Visitation Policy:  Patients having surgery or a procedure may have two visitors.  Children under the age of 25 must have an adult with them who is not the patient.     Preparing for Surgery with CHLORHEXIDINE  GLUCONATE (CHG) Soap  Chlorhexidine  Gluconate (CHG) Soap  o An antiseptic cleaner that kills germs and bonds with the skin to continue killing germs even after washing  o Used for showering the night before surgery and morning of surgery  Before surgery, you can play an important role by reducing the number of germs on your skin.  CHG (Chlorhexidine  gluconate) soap is an antiseptic cleanser which kills germs and bonds with the skin to continue killing germs even after washing.  Please do not use if you have an allergy to CHG or antibacterial soaps. If your skin becomes reddened/irritated stop using the CHG.  1. Shower the NIGHT BEFORE SURGERY and the MORNING OF SURGERY with CHG soap.  2. If you choose to wash your hair, wash your hair first as usual with your normal shampoo.  3. After shampooing, rinse your hair and body thoroughly to remove the shampoo.  4. Use CHG as you would any other liquid soap. You can apply CHG directly to the skin and wash gently with a scrungie or a clean washcloth.  5. Apply the CHG soap to your body only from the neck down. Do not use on open wounds or open sores. Avoid contact with your eyes, ears, mouth, and genitals (private parts). Wash face and genitals (private parts) with your normal soap.  6. Wash thoroughly, paying special attention to the area where your surgery will be performed.  7. Thoroughly rinse your body with warm water.  8. Do not shower/wash with your normal soap after using and rinsing off the CHG soap.  9. Pat yourself dry with a clean  towel.  10. Wear clean pajamas to bed the night before surgery.  12. Place clean sheets on your bed the night of your first shower and do not sleep with pets.  13. Shower again with the CHG soap on the day of surgery prior to arriving at the hospital.  14. Do not apply any deodorants/lotions/powders.  15. Please wear clean clothes to the hospital.

## 2023-10-16 ENCOUNTER — Ambulatory Visit: Admission: RE | Admit: 2023-10-16 | Source: Ambulatory Visit

## 2023-10-21 ENCOUNTER — Encounter: Payer: Self-pay | Admitting: Urgent Care

## 2023-10-21 ENCOUNTER — Telehealth: Payer: Self-pay

## 2023-10-21 NOTE — Telephone Encounter (Signed)
     Primary Cardiologist: Timothy Gollan, MD  Chart reviewed as part of pre-operative protocol coverage. Given past medical history and time since last visit, based on ACC/AHA guidelines, Monica Hull would be at acceptable risk for the planned procedure without further cardiovascular testing.   Her RCRI is low risk, 0.9% risk of major cardiac event.  She is able to complete greater than 4 METS of physical activity.  Patient was advised that if she develops new symptoms prior to surgery to contact our office to arrange a follow-up appointment.  He verbalized understanding.  I will route this recommendation to the requesting party via Epic fax function and remove from pre-op pool.  Please call with questions.  Chet Cota. Judas Mohammad NP-C     10/21/2023, 3:34 PM [facility] [address] Office [phone] Fax [phone]

## 2023-10-21 NOTE — Telephone Encounter (Addendum)
   Pre-operative Risk Assessment    Patient Name: Monica Hull  DOB: 04-27-1961 MRN: [id number]   Date of last office visit: 07/07/22 WITH DR. Jerelene Monday  Date of next office visit: TBD   Request for Surgical Clearance    Procedure:   RIGHT ENDOSCOPIC CARPAL TUNNEL RELEASE  Date of Surgery:  Clearance 10/22/22                                Surgeon:  DR. Delayne Feather Surgeon's Group or Practice Name:  [facility] Phone number:  [phone] Fax number:  [phone]   Type of Clearance Requested:   - Medical  - PATIENT TO CONTINUE DAILY ON LOW DOSE ASA THROUGHOUT THE PERIOPERATIVE COURSE   Type of Anesthesia:  General    Additional requests/questions:    SignedVira Grieves   10/21/2023, 3:18 PM  3

## 2023-10-21 NOTE — Progress Notes (Signed)
 Perioperative / Anesthesia Services  Pre-Admission Testing Clinical Review / Pre-Operative Anesthesia Consult  Date: 10/21/23  Patient Demographics:  Name: Monica Hull DOB: [birth year] MRN:   [id number]  Planned Surgical Procedure(s):    Case: [id number] Date/Time: 10/22/23 1300   Procedure: RELEASE, CARPAL TUNNEL, ENDOSCOPIC (Right: Wrist)   Anesthesia type: Choice   Diagnosis: Carpal tunnel syndrome, right [G56.01]   Pre-op diagnosis: CARPAL TUNNEL SYNDROME, RIGHT   Location: [facility] OR ROOM 02 / [facility]   Surgeons: Elner Hahn, MD      NOTE: Available PAT nursing documentation and vital signs have been reviewed. Clinical nursing staff has updated patient's PMH/PSHx, current medication list, and drug allergies/intolerances to ensure comprehensive history available to assist in medical decision making as it pertains to the aforementioned surgical procedure and anticipated anesthetic course. Extensive review of available clinical information personally performed. Monica Hull PMH and PSHx updated with any diagnoses/procedures that  may have been inadvertently omitted during his intake with the pre-admission testing department's nursing staff.  Clinical Discussion:  Monica Hull is a 63 y.o. female who is submitted for pre-surgical anesthesia review and clearance prior to her undergoing the above procedure. Patient is a Current Smoker (39 pack years). Pertinent PMH includes: CAD, BILATERAL carotid artery stenosis, aortic atherosclerosis, HTN, HLD, T2DM, CKD, asthma, COPD, OSAH (no nocturnal PAP therapy), GERD (on daily PPI), hepatic cirrhosis, BILATERAL carpal tunnel syndrome, OA, cervical DDD.  Patient is followed by cardiology (Gollan, MD). She was last seen in the cardiology clinic on 07/08/2023; notes reviewed. At the time of her clinic visit, patient doing well overall from a cardiovascular perspective.  Patient with complaints of ongoing  shortness of breath that she attributed to her underlying COPD and ongoing smoking history. Patient denied any chest pain, PND, orthopnea, palpitations, significant peripheral edema, weakness, fatigue, vertiginous symptoms, or presyncope/syncope. Patient with a past medical history significant for cardiovascular diagnoses. Documented physical exam was grossly benign, providing no evidence of acute exacerbation and/or decompensation of the patient's known cardiovascular conditions.  Coronary CTA was performed on 06/16/2020 that demonstrated an Agatston coronary artery calcium  score of 249. This placed patient in the 95th percentile for age, sex, and race matched controls. Calcium  depositions noted to be isolated mainly in the mid RCA (<25%), mid LCx (less than 25%), and mid LAD (50%) distributions.  Study demonstrates normal coronary origin with RIGHT dominance.  Study was sent for CT FFR analysis, which demonstrated no significant stenosis.  Patient with known history of carotid artery disease.  Most recent Doppler study was performed on 07/24/2023 revealing a 1-39% stenosis of the LICA.  Contralaterally, there was near normal carotid diameter with only minimal wall thickening or plaque.  Vertebrals demonstrated antegrade flow.  There was normal flow hemodynamics noted in the patient's BILATERAL subclavians.  Blood pressure well controlled at 120/80 mmHg on currently prescribed ACEi (lisinopril ) monotherapy.  Patient is on atorvastatin  for her HLD diagnosis and ASCVD prevention. T2DM well controlled on currently prescribed regimen; last HgbA1c was 7.0% when checked on 04/24/2023.  Patient does have an OSAH diagnosis, however does not currently utilize nocturnal PAP therapy.  She has been referred to a sleep center by her PCP.  Functional capacity limited by patient's shortness of breath and lower extremity pain.  With that said, patient is able to complete all of her ADLs/IADLs without cardiovascular  limitation.  Per the DASI, patient is able to achieve at least 4 METS of physical activity without experiencing any  significant degree of angina/anginal equivalent symptoms. No changes were made to her medication regimen during her visit with cardiology.  Patient scheduled to follow-up with outpatient cardiology in 1 year or sooner if needed.  Monica Hull is scheduled for an elective RELEASE, CARPAL TUNNEL, ENDOSCOPIC (Right: Wrist) on 10/22/2023 with Dr. Delayne Feather, MD.  Given patient's past medical history significant for cardiovascular diagnoses, presurgical cardiac clearance was sought by the PAT team. Per cardiology, "RCRI is low risk, 0.9% risk of major cardiac event.  She is able to complete greater than 4 METS of physical activity. Based ACC/AHA guidelines, the patient's past medical history, and the amount of time since her last clinic visit, this patient would be at an overall ACCEPTABLE risk for the planned procedure without further cardiovascular testing or intervention at this time".   In review of the patient's chart, it is noted that she is on daily oral antithrombotic therapy. Given that patient's past medical history is significant for cardiovascular diagnoses, including but not limited to CAD, orthopedics has cleared patient to continue her daily low dose ASA throughout her perioperative course.  Patient has been updated on these directives from her specialty care providers by the PAT team.  Patient denies previous perioperative complications with anesthesia in the past. In review her EMR, it is noted that patient underwent a general anesthetic course here at [facility] ([facility]) in 09/2021 without documented complications.      10/15/2023   11:13 AM 08/01/2023    8:57 AM 07/08/2023    8:45 AM  Vitals with BMI  Height 5\' 2"  5\' 2"  5\' 2"   Weight 180 lbs 180 lbs 10 oz 180 lbs 6 oz  BMI 32.91 33.02 32.98  Systolic  124 120  Diastolic  82 80   Pulse  76 66   Providers/Specialists:  NOTE: Primary physician provider listed below. Patient may have been seen by APP or partner within same practice.   PROVIDER ROLE / SPECIALTY LAST OV  Poggi, Kaylene Pascal, MD Orthopedics (Surgeon) 10/07/2023  Solomon Dupre, DO Primary Care Provider 05/27/2023  Belva Boyden, MD Cardiology 07/08/2023; preop APP call 10/21/2023  Coralie Derrick, MD Pulmonary Medicine 08/01/2023  Cullen Dose, MD Neurology 09/09/2023   Allergies:   Allergies  Allergen Reactions   Advair Hfa [Fluticasone -Salmeterol] Rash    Rash and thrush   Anoro Ellipta  [Umeclidinium-Vilanterol] Rash    Causes patients tongue to break out   Biaxin [Clarithromycin] Nausea And Vomiting and Rash   Oxycodone  Rash   Current Home Medications:   No current facility-administered medications for this encounter.    albuterol  (PROVENTIL ) (2.5 MG/3ML) 0.083% nebulizer solution   aspirin EC 81 MG tablet   atorvastatin  (LIPITOR) 80 MG tablet   Budeson-Glycopyrrol-Formoterol  (BREZTRI  AEROSPHERE) 160-9-4.8 MCG/ACT AERO   celecoxib (CELEBREX) 200 MG capsule   cetirizine  (ZYRTEC ) 10 MG tablet   diclofenac  Sodium (VOLTAREN ) 1 % GEL   fluticasone  (FLONASE ) 50 MCG/ACT nasal spray   levalbuterol  (XOPENEX  HFA) 45 MCG/ACT inhaler   LINZESS 290 MCG CAPS capsule   lisinopril  (ZESTRIL ) 40 MG tablet   pantoprazole  (PROTONIX ) 40 MG tablet   Semaglutide ,0.25 or 0.5MG /DOS, (OZEMPIC , 0.25 OR 0.5 MG/DOSE,) 2 MG/3ML SOPN   trolamine salicylate (ASPERCREME) 10 % cream   Spacer/Aero-Holding Chambers (AEROCHAMBER MV) inhaler   History:   Past Medical History:  Diagnosis Date   Aortic atherosclerosis (HCC)    Arthritis    Asthma    Bilateral carotid artery disease (HCC)  CAD (coronary artery disease)    Carpal tunnel syndrome    Chronic kidney disease    Cirrhosis (HCC)    COPD (chronic obstructive pulmonary disease) (HCC)    DDD (degenerative disc disease), cervical    Elevated liver  function tests    GERD (gastroesophageal reflux disease)    Hepatic steatosis    History of kidney stones    Hyperlipidemia    Hypertension    Impaired fasting glucose    Long-term use of aspirin therapy    Migraines    OSA (obstructive sleep apnea)    a.) no nocturnal PAP therapy   Pneumonia    Tobacco abuse    Type 2 diabetes with complication [facility])    Past Surgical History:  Procedure Laterality Date   CARPAL TUNNEL RELEASE Left    CESAREAN SECTION     X2   COLONOSCOPY WITH PROPOFOL  N/A 10/10/2023   Procedure: COLONOSCOPY WITH PROPOFOL ;  Surgeon: Quintin Buckle, DO;  Location: [facility] ENDOSCOPY;  Service: Gastroenterology;  Laterality: N/A;  DM   DILATION AND CURETTAGE OF UTERUS     ESOPHAGOGASTRODUODENOSCOPY (EGD) WITH PROPOFOL  N/A 10/10/2023   Procedure: ESOPHAGOGASTRODUODENOSCOPY (EGD) WITH PROPOFOL ;  Surgeon: Quintin Buckle, DO;  Location: [facility] ENDOSCOPY;  Service: Gastroenterology;  Laterality: N/A;   HYSTEROSCOPY WITH D & C N/A 07/11/2015   Procedure: DILATATION AND CURETTAGE /HYSTEROSCOPY;  Surgeon: Teresa Fender, MD;  Location: [facility] ORS;  Service: Gynecology;  Laterality: N/A;   PALATE / UVULA BIOPSY / EXCISION     REVERSE SHOULDER ARTHROPLASTY Right 10/12/2021   Procedure: REVERSE TOTAL SHOULDER ARTHROPLASTY WITH BICEPS TENODESIS.;  Surgeon: Elner Hahn, MD;  Location: [facility] ORS;  Service: Orthopedics;  Laterality: Right;   TONSILLECTOMY     childhood   Family History  Problem Relation Age of Onset   Arthritis Mother    Asthma Mother    Diabetes Mother    Heart disease Mother    Hyperlipidemia Mother    Hypertension Mother    Kidney disease Mother    Lung disease Mother    Pancreatic cancer Mother    Alcohol abuse Father    Hypertension Father    Hyperlipidemia Father    Diabetes Brother    Breast cancer Maternal Aunt 45   Breast cancer Maternal Aunt 26   Social History   Tobacco Use   Smoking status: Every Day    Current packs/day: 1.00     Average packs/day: 1 pack/day for 39.0 years (39.0 ttl pk-yrs)    Types: Cigarettes   Smokeless tobacco: Never   Tobacco comments:    1 PPD- 07/31/2023 khj  Substance Use Topics   Alcohol use: Not Currently    Comment: rarely   Pertinent Clinical Results:  LABS:  Hospital Outpatient Visit on 10/15/2023  Component Date Value Ref Range Status   Sodium 10/15/2023 140  135 - 145 mmol/L Final   Potassium 10/15/2023 3.6  3.5 - 5.1 mmol/L Final   Chloride 10/15/2023 104  98 - 111 mmol/L Final   CO2 10/15/2023 28  22 - 32 mmol/L Final   Glucose, Bld 10/15/2023 126 (H)  70 - 99 mg/dL Final   Glucose reference range applies only to samples taken after fasting for at least 8 hours.   BUN 10/15/2023 8  8 - 23 mg/dL Final   Creatinine, Ser 10/15/2023 0.72  0.44 - 1.00 mg/dL Final   Calcium  10/15/2023 9.4  8.9 - 10.3 mg/dL Final   GFR, Estimated 10/15/2023 >60  >  60 mL/min Final   Comment: (NOTE) Calculated using the CKD-EPI Creatinine Equation (2021)    Anion gap 10/15/2023 8  5 - 15 Final   Performed at [facility], [address] [place], [place] [address]   WBC 10/15/2023 7.6  4.0 - 10.5 K/uL Final   RBC 10/15/2023 5.19 (H)  3.87 - 5.11 MIL/uL Final   Hemoglobin 10/15/2023 15.6 (H)  12.0 - 15.0 g/dL Final   HCT 60/45/4098 46.2 (H)  36.0 - 46.0 % Final   MCV 10/15/2023 89.0  80.0 - 100.0 fL Final   MCH 10/15/2023 30.1  26.0 - 34.0 pg Final   MCHC 10/15/2023 33.8  30.0 - 36.0 g/dL Final   RDW 11/91/4782 13.0  11.5 - 15.5 % Final   Platelets 10/15/2023 251  150 - 400 K/uL Final   nRBC 10/15/2023 0.0  0.0 - 0.2 % Final   Performed at [facility], [address] [place], [place] [address]    ECG: Date: 07/08/2023  Time ECG obtained: 0849 AM Rate: 66 bpm Rhythm:  Sinus rhythm with short PR Axis (leads I and aVF): normal Intervals: PR 106 ms. QRS 92 ms. QTc 454 ms. ST segment and T wave changes: No evidence of acute T wave abnormalities or significant ST  segment elevation or depression.  Evidence of a possible, age undetermined, prior infarct:  No Comparison: Similar to previous tracing obtained on 12/29/2021   IMAGING / PROCEDURES: CT SHOULDER LEFT WO CONTRAST performed on 10/15/2023 Severe osteoarthritis of the left glenohumeral joint. Moderate arthropathy of the left acromioclavicular joint. Left foraminal narrowing at C3-4, C4-5, C5-6, C6-7 and C7-T1. Aortic atherosclerosis  VAS US  CAROTID performed on 07/24/2023 Velocities in the right ICA are consistent with a 1-39% stenosis.  The extracranial vessels were near-normal with only minimal wall thickening or plaque in the left ICA. Bilateral vertebral arteries demonstrate antegrade flow.  Normal flow hemodynamics were seen in bilateral subclavian arteries.   VAS US  ABI WITH/WO TBI performed on 07/24/2023 Resting right ankle-brachial index is within normal range. The right toe-brachial index is normal.  Resting left ankle-brachial index is within normal range. The left toe-brachial index is normal.    CT CHEST LUNG CA SCREEN LOW DOSE W/O CM performed on 05/28/2023 Lung-RADS 2, benign appearance or behavior. Continue annual screening with low-dose chest CT without contrast in 12 months. Age advanced coronary artery atherosclerosis. Recommend assessment of coronary risk factors. Hepatic steatosis.   Mild cirrhosis. Aortic atherosclerosis  Emphysema  PULMONARY FUNCTION TESTING performed on 09/26/2021    Latest Ref Rng & Units 02/16/2016    3:07 PM  PFT Results  FVC-Pre L 2.59    FVC-Predicted Pre % 80    FVC-Post L 2.54    FVC-Predicted Post % 78    Pre FEV1/FVC % % 54    Post FEV1/FCV % % 62    FEV1-Pre L 1.39   FEV1-Predicted Pre % 55    FEV1-Post L 1.58   DLCO uncorrected ml/min/mmHg 21.14  DLCO UNC% % 95    DLVA Predicted % 106    CT CORONARY MORPH W/CTA COR W/SCORE W/CA W/CM &/OR WO/CM W/ FRACTIONAL FLOW RESERVE DATA PREP performed on 06/24/2020 Coronary calcium   score of 249. This was 95th percentile for age and sex matched control. Normal coronary origin with right dominance. Calcified and non calcified plaque in the mid LAD causing moderate stenosis (50%). Minimal calcifications (<25%) in the mid RCA and LCx arteries CAD-RADS 3. Moderate stenosis. Consider  symptom-guided anti-ischemic pharmacotherapy as well as risk factor modification per guideline directed care.  CT FFR analysis (ranges: < 0.75 high likelihood of hemodynamically significant stenosis, 0.76-0.80 borderline, > 0.80 normal): Left Main:  No significant stenosis. LAD: No significant stenosis LCX: No significant stenosis RCA: No significant stenosis   Impression and Plan:  Monica Hull has been referred for pre-anesthesia review and clearance prior to her undergoing the planned anesthetic and procedural courses. Available labs, pertinent testing, and imaging results were personally reviewed by me in preparation for upcoming operative/procedural course. [facility] medical record has been updated following extensive record review and patient interview with PAT staff.   This patient has been appropriately cleared by cardiology with an overall LOW risk of patient experiencing significant perioperative cardiovascular complications. Based on clinical review performed today (10/21/23), barring any significant acute changes in the patient's overall condition, it is anticipated that she will be able to proceed with the planned surgical intervention. Any acute changes in clinical condition may necessitate her procedure being postponed and/or cancelled. Patient will meet with anesthesia team (MD and/or CRNA) on the day of her procedure for preoperative evaluation/assessment. Questions regarding anesthetic course will be fielded at that time.   Pre-surgical instructions were reviewed with the patient during his PAT appointment, and questions were fielded to satisfaction by PAT clinical staff.  She has been instructed on which medications that she will need to hold prior to surgery, as well as the ones that have been deemed safe/appropriate to take on the day of her procedure. As part of the general education provided by PAT, patient made aware both verbally and in writing, that she would need to abstain from the use of any illegal substances during her perioperative course. She was advised that failure to follow the provided instructions could necessitate case cancellation or result in serious perioperative complications up to and including death. Patient encouraged to contact PAT and/or her surgeon's office to discuss any questions or concerns that may arise prior to surgery; verbalized understanding.   Renate Caroline, MSN, APRN, FNP-C, CEN [facility]  Perioperative Services Nurse Practitioner Phone: [phone] Fax: [phone] 10/21/23 3:51 PM  NOTE: This note has been prepared using Dragon dictation software. Despite my best ability to proofread, there is always the potential that unintentional transcriptional errors may still occur from this process.

## 2023-10-22 ENCOUNTER — Encounter: Payer: Self-pay | Admitting: Surgery

## 2023-10-22 ENCOUNTER — Ambulatory Visit: Payer: Self-pay | Admitting: Urgent Care

## 2023-10-22 ENCOUNTER — Ambulatory Visit
Admission: RE | Admit: 2023-10-22 | Discharge: 2023-10-22 | Disposition: A | Discharge status: Home / Self Care | Attending: Surgery | Admitting: Surgery

## 2023-10-22 ENCOUNTER — Encounter
Admission: RE | Disposition: A | Discharge status: Home / Self Care | Payer: Self-pay | Source: Home / Self Care | Attending: Surgery

## 2023-10-22 ENCOUNTER — Other Ambulatory Visit: Payer: Self-pay

## 2023-10-22 DIAGNOSIS — Z7982 Long term (current) use of aspirin: Secondary | ICD-10-CM | POA: Insufficient documentation

## 2023-10-22 DIAGNOSIS — G4733 Obstructive sleep apnea (adult) (pediatric): Secondary | ICD-10-CM | POA: Insufficient documentation

## 2023-10-22 DIAGNOSIS — E785 Hyperlipidemia, unspecified: Secondary | ICD-10-CM | POA: Diagnosis not present

## 2023-10-22 DIAGNOSIS — J4489 Other specified chronic obstructive pulmonary disease: Secondary | ICD-10-CM | POA: Insufficient documentation

## 2023-10-22 DIAGNOSIS — I700 Atherosclerosis of aorta: Secondary | ICD-10-CM | POA: Diagnosis not present

## 2023-10-22 DIAGNOSIS — Z79899 Other long term (current) drug therapy: Secondary | ICD-10-CM | POA: Diagnosis not present

## 2023-10-22 DIAGNOSIS — K7460 Unspecified cirrhosis of liver: Secondary | ICD-10-CM | POA: Insufficient documentation

## 2023-10-22 DIAGNOSIS — N189 Chronic kidney disease, unspecified: Secondary | ICD-10-CM | POA: Insufficient documentation

## 2023-10-22 DIAGNOSIS — I129 Hypertensive chronic kidney disease with stage 1 through stage 4 chronic kidney disease, or unspecified chronic kidney disease: Secondary | ICD-10-CM | POA: Diagnosis not present

## 2023-10-22 DIAGNOSIS — K219 Gastro-esophageal reflux disease without esophagitis: Secondary | ICD-10-CM | POA: Diagnosis not present

## 2023-10-22 DIAGNOSIS — F17210 Nicotine dependence, cigarettes, uncomplicated: Secondary | ICD-10-CM | POA: Insufficient documentation

## 2023-10-22 DIAGNOSIS — Z7985 Long-term (current) use of injectable non-insulin antidiabetic drugs: Secondary | ICD-10-CM | POA: Diagnosis not present

## 2023-10-22 DIAGNOSIS — G5601 Carpal tunnel syndrome, right upper limb: Secondary | ICD-10-CM | POA: Diagnosis not present

## 2023-10-22 DIAGNOSIS — E1122 Type 2 diabetes mellitus with diabetic chronic kidney disease: Secondary | ICD-10-CM | POA: Insufficient documentation

## 2023-10-22 DIAGNOSIS — I6523 Occlusion and stenosis of bilateral carotid arteries: Secondary | ICD-10-CM | POA: Diagnosis not present

## 2023-10-22 DIAGNOSIS — Z01818 Encounter for other preprocedural examination: Secondary | ICD-10-CM

## 2023-10-22 DIAGNOSIS — I2510 Atherosclerotic heart disease of native coronary artery without angina pectoris: Secondary | ICD-10-CM | POA: Insufficient documentation

## 2023-10-22 DIAGNOSIS — E1169 Type 2 diabetes mellitus with other specified complication: Secondary | ICD-10-CM

## 2023-10-22 HISTORY — PX: CARPAL TUNNEL RELEASE: SHX101

## 2023-10-22 HISTORY — DX: Unspecified cirrhosis of liver: K74.60

## 2023-10-22 HISTORY — DX: Obstructive sleep apnea (adult) (pediatric): G47.33

## 2023-10-22 HISTORY — DX: Atherosclerotic heart disease of native coronary artery without angina pectoris: I25.10

## 2023-10-22 HISTORY — DX: Migraine, unspecified, not intractable, without status migrainosus: G43.909

## 2023-10-22 HISTORY — DX: Disorder of arteries and arterioles, unspecified: I77.9

## 2023-10-22 HISTORY — DX: Atherosclerosis of aorta: I70.0

## 2023-10-22 HISTORY — DX: Fatty (change of) liver, not elsewhere classified: K76.0

## 2023-10-22 HISTORY — DX: Long term (current) use of aspirin: Z79.82

## 2023-10-22 HISTORY — DX: Other cervical disc degeneration, unspecified cervical region: M50.30

## 2023-10-22 LAB — GLUCOSE, CAPILLARY
Glucose-Capillary: 136 mg/dL — ABNORMAL HIGH (ref 70–99)
Glucose-Capillary: 138 mg/dL — ABNORMAL HIGH (ref 70–99)

## 2023-10-22 SURGERY — RELEASE, CARPAL TUNNEL, ENDOSCOPIC
Anesthesia: Choice | Site: Wrist | Laterality: Right

## 2023-10-22 MED ORDER — KETOROLAC TROMETHAMINE 30 MG/ML IJ SOLN
INTRAMUSCULAR | Status: DC | PRN
  Administered 2023-10-22: 30 mg via INTRAVENOUS

## 2023-10-22 MED ORDER — SODIUM CHLORIDE 0.9 % IV SOLN: INTRAVENOUS | Status: DC

## 2023-10-22 MED ORDER — METOCLOPRAMIDE HCL 5 MG/ML IJ SOLN: 5.0000 mg | Freq: Three times a day (TID) | INTRAMUSCULAR | Status: DC | PRN

## 2023-10-22 MED ORDER — CHLORHEXIDINE GLUCONATE 0.12 % MT SOLN
15.0000 mL | Freq: Once | OROMUCOSAL | Status: AC
  Administered 2023-10-22: 15 mL via OROMUCOSAL

## 2023-10-22 MED ORDER — MIDAZOLAM HCL 2 MG/2ML IJ SOLN
INTRAMUSCULAR | Status: DC | PRN
  Administered 2023-10-22: 2 mg via INTRAVENOUS

## 2023-10-22 MED ORDER — CHLORHEXIDINE GLUCONATE 0.12 % MT SOLN
OROMUCOSAL | Status: AC
  Filled 2023-10-22: qty 15

## 2023-10-22 MED ORDER — KETOROLAC TROMETHAMINE 30 MG/ML IJ SOLN
INTRAMUSCULAR | Status: AC
  Filled 2023-10-22: qty 1

## 2023-10-22 MED ORDER — BUPIVACAINE HCL (PF) 0.5 % IJ SOLN
INTRAMUSCULAR | Status: DC | PRN
  Administered 2023-10-22: 10 mL

## 2023-10-22 MED ORDER — CEFAZOLIN SODIUM-DEXTROSE 2-4 GM/100ML-% IV SOLN
2.0000 g | INTRAVENOUS | Status: AC
  Administered 2023-10-22: 2 g via INTRAVENOUS

## 2023-10-22 MED ORDER — 0.9 % SODIUM CHLORIDE (POUR BTL) OPTIME
TOPICAL | Status: DC | PRN
  Administered 2023-10-22: 500 mL

## 2023-10-22 MED ORDER — MIDAZOLAM HCL 2 MG/2ML IJ SOLN
INTRAMUSCULAR | Status: AC
  Filled 2023-10-22: qty 2

## 2023-10-22 MED ORDER — ORAL CARE MOUTH RINSE: 15.0000 mL | Freq: Once | OROMUCOSAL | Status: AC

## 2023-10-22 MED ORDER — LIDOCAINE HCL (CARDIAC) PF 100 MG/5ML IV SOSY
PREFILLED_SYRINGE | INTRAVENOUS | Status: DC | PRN
  Administered 2023-10-22: 100 mg via INTRAVENOUS

## 2023-10-22 MED ORDER — CEFAZOLIN SODIUM-DEXTROSE 2-4 GM/100ML-% IV SOLN
INTRAVENOUS | Status: AC
  Filled 2023-10-22: qty 100

## 2023-10-22 MED ORDER — DEXAMETHASONE SODIUM PHOSPHATE 10 MG/ML IJ SOLN
INTRAMUSCULAR | Status: DC | PRN
  Administered 2023-10-22: 10 mg via INTRAVENOUS

## 2023-10-22 MED ORDER — DEXAMETHASONE SODIUM PHOSPHATE 10 MG/ML IJ SOLN
INTRAMUSCULAR | Status: AC
  Filled 2023-10-22: qty 1

## 2023-10-22 MED ORDER — PROPOFOL 10 MG/ML IV BOLUS
INTRAVENOUS | Status: AC
  Filled 2023-10-22: qty 20

## 2023-10-22 MED ORDER — PROPOFOL 10 MG/ML IV BOLUS
INTRAVENOUS | Status: DC | PRN
  Administered 2023-10-22: 200 ug/kg/min via INTRAVENOUS
  Administered 2023-10-22: 125 ug/kg/min via INTRAVENOUS
  Administered 2023-10-22: 200 mg via INTRAVENOUS

## 2023-10-22 MED ORDER — METOCLOPRAMIDE HCL 10 MG PO TABS: 5.0000 mg | ORAL_TABLET | Freq: Three times a day (TID) | ORAL | Status: DC | PRN

## 2023-10-22 MED ORDER — FENTANYL CITRATE (PF) 100 MCG/2ML IJ SOLN: 25.0000 ug | INTRAMUSCULAR | Status: DC | PRN

## 2023-10-22 MED ORDER — ONDANSETRON HCL 4 MG/2ML IJ SOLN
4.0000 mg | Freq: Four times a day (QID) | INTRAMUSCULAR | Status: DC | PRN
Start: 2023-10-22 — End: 2023-10-22

## 2023-10-22 MED ORDER — BUPIVACAINE HCL (PF) 0.5 % IJ SOLN
INTRAMUSCULAR | Status: AC
  Filled 2023-10-22: qty 30

## 2023-10-22 MED ORDER — CETIRIZINE HCL 10 MG PO TABS: 10.0000 mg | ORAL_TABLET | Freq: Every day | ORAL | Status: AC

## 2023-10-22 MED ORDER — DROPERIDOL 2.5 MG/ML IJ SOLN: 0.6250 mg | Freq: Once | INTRAMUSCULAR | Status: DC | PRN

## 2023-10-22 MED ORDER — OZEMPIC (0.25 OR 0.5 MG/DOSE) 2 MG/3ML ~~LOC~~ SOPN: 0.5000 mg | PEN_INJECTOR | SUBCUTANEOUS | Status: DC

## 2023-10-22 MED ORDER — ACETAMINOPHEN 325 MG PO TABS: 325.0000 mg | ORAL_TABLET | Freq: Four times a day (QID) | ORAL | Status: DC | PRN

## 2023-10-22 MED ORDER — ONDANSETRON HCL 4 MG PO TABS
4.0000 mg | ORAL_TABLET | Freq: Four times a day (QID) | ORAL | Status: DC | PRN
Start: 2023-10-22 — End: 2023-10-22

## 2023-10-22 SURGICAL SUPPLY — 30 items
BNDG COHESIVE 4X5 TAN STRL LF (GAUZE/BANDAGES/DRESSINGS) ×1 IMPLANT
BNDG ELASTIC 2INX 5YD STR LF (GAUZE/BANDAGES/DRESSINGS) ×1 IMPLANT
BNDG ESMARCH 4X12 STRL LF (GAUZE/BANDAGES/DRESSINGS) ×1 IMPLANT
CHLORAPREP W/TINT 26 (MISCELLANEOUS) ×1 IMPLANT
CORD BIP STRL DISP 12FT (MISCELLANEOUS) ×1 IMPLANT
CUFF TOURN SGL QUICK 18X4 (TOURNIQUET CUFF) ×1 IMPLANT
DRAPE SURG 17X11 SM STRL (DRAPES) ×1 IMPLANT
FORCEPS JEWEL BIP 4-3/4 STR (INSTRUMENTS) ×1 IMPLANT
GAUZE SPONGE 4X4 12PLY STRL (GAUZE/BANDAGES/DRESSINGS) ×1 IMPLANT
GAUZE XEROFORM 1X8 LF (GAUZE/BANDAGES/DRESSINGS) ×1 IMPLANT
GLOVE BIO SURGEON STRL SZ8 (GLOVE) ×1 IMPLANT
GLOVE INDICATOR 8.0 STRL GRN (GLOVE) ×1 IMPLANT
GOWN STRL REUS W/ TWL LRG LVL3 (GOWN DISPOSABLE) ×1 IMPLANT
GOWN STRL REUS W/ TWL XL LVL3 (GOWN DISPOSABLE) ×1 IMPLANT
HOLSTER ELECTROSUGICAL PENCIL (MISCELLANEOUS) IMPLANT
KIT ESCP INSRT D SLOT CANN KN (MISCELLANEOUS) ×1 IMPLANT
KIT TURNOVER KIT A (KITS) ×1 IMPLANT
MANIFOLD NEPTUNE II (INSTRUMENTS) ×1 IMPLANT
NS IRRIG 500ML POUR BTL (IV SOLUTION) ×1 IMPLANT
PACK EXTREMITY ARMC (MISCELLANEOUS) ×1 IMPLANT
SPLINT WRIST LG LT TX990309 (SOFTGOODS) IMPLANT
SPLINT WRIST LG RT TX900304 (SOFTGOODS) IMPLANT
SPLINT WRIST M LT TX990308 (SOFTGOODS) IMPLANT
SPLINT WRIST M RT TX990303 (SOFTGOODS) IMPLANT
SPLINT WRIST XL LT TX990310 (SOFTGOODS) IMPLANT
SPLINT WRIST XL RT TX990305 (SOFTGOODS) IMPLANT
STOCKINETTE IMPERVIOUS 9X36 MD (GAUZE/BANDAGES/DRESSINGS) ×1 IMPLANT
SUT PROLENE 4 0 PS 2 18 (SUTURE) ×1 IMPLANT
TRAP FLUID SMOKE EVACUATOR (MISCELLANEOUS) ×1 IMPLANT
WATER STERILE IRR 500ML POUR (IV SOLUTION) IMPLANT

## 2023-10-22 NOTE — Discharge Instructions (Addendum)
 Orthopedic discharge instructions: Keep dressing dry and intact. Keep hand elevated above heart level. May shower after dressing removed on postop day 4 (Saturday). Cover sutures with Band-Aid after drying off, then reapply Velcro splint. Apply ice to affected area frequently. Resume Celebrex daily OR take ibuprofen 600-800 mg TID with meals for 3-5 days, then as necessary. Take ES Tylenol  or pain medication as prescribed when needed.  May resume daily baby aspirin tomorrow morning as prescribed. Return for follow-up in 10-14 days or as scheduled.

## 2023-10-22 NOTE — Transfer of Care (Signed)
 Immediate Anesthesia Transfer of Care Note  Patient: Cathe Clore Duddy  Procedure(s) Performed: RELEASE, CARPAL TUNNEL, ENDOSCOPIC (Right: Wrist)  Patient Location: PACU  Anesthesia Type:General  Level of Consciousness: awake  Airway & Oxygen Therapy: Patient Spontanous Breathing and Patient connected to nasal cannula oxygen  Post-op Assessment: Report given to RN and Post -op Vital signs reviewed and stable  Post vital signs: Reviewed and stable  Last Vitals:  Vitals Value Taken Time  BP 152/80 10/22/23 1409  Temp    Pulse 83 10/22/23 1412  Resp 27 10/22/23 1412  SpO2 100 % 10/22/23 1412  Vitals shown include unfiled device data.  Last Pain:  Vitals:   10/22/23 1151  TempSrc: Temporal  PainSc: 9          Complications: There were no known notable events for this encounter.

## 2023-10-22 NOTE — Op Note (Signed)
 10/22/2023  2:21 PM  Patient:   Monica Hull  Pre-Op Diagnosis:   Right carpal tunnel syndrome.  Post-Op Diagnosis:   Same.  Procedure:   Endoscopic right carpal tunnel release.  Surgeon:   Lonnie Roberts, MD  Assistant:   Russell Court, PA-S  Anesthesia:   General LMA  Findings:   As above.  Complications:   None  EBL:   0 cc  Fluids:   300 cc crystalloid  TT:   14 minutes at 250 mmHg  Drains:   None  Closure:   4-0 Prolene interrupted sutures  Brief Clinical Note:   The patient is a 63 year old female with a long history of progressively worsening pain and paresthesias to her right hand. Her symptoms have progressed despite medications, activity modification, splinting, and several carpal tunnel injections. Her history and examination are consistent with carpal tunnel syndrome. The patient presents at this time for an endoscopic right carpal tunnel release.   Procedure:   The patient was brought into the operating room and lain in the supine position. After adequate general laryngeal mask anesthesia was obtained, the right hand and upper extremity were prepped with ChloraPrep solution before being draped sterilely. Preoperative antibiotics were administered. A timeout was performed to verify the appropriate surgical site before the limb was exsanguinated with an Esmarch and the tourniquet inflated to 250 mmHg.   An approximately 1.5-2 cm incision was made over the volar wrist flexion crease, centered over the palmaris longus tendon. The incision was carried down through the subcutaneous tissues with care taken to identify and protect any neurovascular structures. The distal forearm fascia was penetrated just proximal to the transverse carpal ligament. The soft tissues were released off the superficial and deep surfaces of the distal forearm fascia and this was released proximally for 3-4 cm under direct visualization.  Attention was directed distally. The Market researcher was passed beneath the transverse carpal ligament along the ulnar aspect of the carpal tunnel and used to release any adhesions as well as to remove any adherent synovial tissue before first the smaller then the larger of the two dilators were passed beneath the transverse carpal ligament along the ulnar margin of the carpal tunnel. The slotted cannula was introduced and the endoscope was placed into the slotted cannula and the undersurface of the transverse carpal ligament visualized. The distal margin of the transverse carpal ligament was marked by placing a 25-gauge needle percutaneously at Kaplan's cardinal point so that it entered the distal portion of the slotted cannula. Under endoscopic visualization, the transverse carpal ligament was released from proximal to distal using the end-cutting blade. A second pass was performed to ensure complete release of the ligament. The adequacy of release was verified both endoscopically and by palpation using the freer elevator.  The wound was irrigated thoroughly with sterile saline solution before being closed using 4-0 Prolene interrupted sutures. A total of 10 cc of 0.5% plain Sensorcaine  was injected in and around the incision before a sterile bulky dressing was applied to the wound. The patient was placed into a volar wrist splint before being awakened, extubated, and returned to the recovery room in satisfactory condition after tolerating the procedure well.

## 2023-10-22 NOTE — H&P (Signed)
 History of Present Illness:  Monica Hull is a 63 y.o. female who presents complaining of worsening right hand and wrist pain and paresthesias. She notes that her symptoms will awaken her from sleep at night and that she has difficulty with any repetitive or sustained activities using her right hand. Her symptoms have progressed despite medications, activity modification, etc. She has been wearing a Velcro right wrist splint at night with limited benefit. She has seen Dr. Kubinski for these symptoms in the past and he has tried several ultrasound-guided steroid injections into the right carpal tunnel region which provided temporary partial relief of her symptoms. However, her symptoms have worsened despite these injections, prompting him to refer her to me to discuss further treatment options. The patient is status post a prior mini-open left carpal tunnel release about 30 years ago from which she has done quite well.  Current Outpatient Medications:  albuterol  (PROVENTIL ) 2.5 mg /3 mL (0.083 %) nebulizer solution Inhale 2.5 mg into the lungs every 6 (six) hours as needed  albuterol  90 mcg/actuation inhaler Inhale into the lungs  aspirin 81 MG EC tablet Take 81 mg by mouth once daily  atorvastatin  (LIPITOR) 40 MG tablet Take 40 mg by mouth once daily  benzonatate  (TESSALON ) 200 MG capsule Take 200 mg by mouth once as needed  BREZTRI  AEROSPHERE 160-9-4.8 mcg/actuation inhaler Inhale 2 inhalations into the lungs 2 (two) times daily  budesonide -glycopyrrolate -formoterol  (BREZTRI  AEROSPHERE) 160-9-4.8 mcg/actuation inhaler Inhale 2 Inhalations into the lungs 2 (two) times daily  cetirizine  (ZYRTEC ) 10 MG tablet Take 1 tablet by mouth once daily  dulaglutide  (TRULICITY ) 0.75 mg/0.5 mL subcutaneous pen injector Inject 0.75 mg subcutaneously every 7 (seven) days  fesoterodine  (TOVIAZ ) 8 mg ER tablet Take 1 tablet by mouth once daily  fluticasone  propionate (FLONASE ) 50 mcg/actuation nasal spray Place into  one nostril Place 2 sprays into both nostrils daily as needed for allergies.  linaCLOtide (LINZESS) 290 mcg capsule Take 1 capsule (290 mcg total) by mouth every morning before breakfast 30 capsule 6  lisinopriL  (ZESTRIL ) 30 MG tablet Take 1 tablet by mouth once daily  lisinopriL  (ZESTRIL ) 40 MG tablet Take 40 mg by mouth once daily  nystatin  (MYCOSTATIN ) 100,000 unit/mL suspension Take by mouth  ondansetron  (ZOFRAN -ODT) 4 MG disintegrating tablet Take 1 tablet (4 mg total) by mouth every 8 (eight) hours as needed for Nausea for up to 12 doses 12 tablet 0  OZEMPIC  0.25 mg or 0.5 mg (2 mg/3 mL) pen injector Inject 0.5 mg subcutaneously once a week  pantoprazole  (PROTONIX ) 40 MG DR tablet Take 1 tablet (40 mg total) by mouth 2 (two) times daily Take 30 min before meals 60 tablet 3  tiZANidine (ZANAFLEX) 4 MG capsule Take 1 capsule by mouth 3 (three) times a day  varenicline  tartrate (CHANTIX  STARTING MONTH PAK) tablet Take 1 tablet by mouth as directed  XOPENEX  HFA inhaler INHALE 2 PUFFS BY MOUTH INTO THE LUNGS EVERY 6 HOURS AS NEEDED FOR WHEEZING OR SHORTNESS OF BREATH  ezetimibe  (ZETIA ) 10 mg tablet Take 1 tablet by mouth once daily   Allergies:  Clarithromycin Nausea, Rash and Vomiting  Fluticasone  Rash  Umeclidinium-Vilanterol Rash   Past Medical History:  Asthma, unspecified asthma severity, unspecified whether complicated, unspecified whether persistent (HHS-HCC)  Chronic kidney disease  COPD (chronic obstructive pulmonary disease) (CMS/HHS-HCC)  Diabetes mellitus without complication (CMS/HHS-HCC)  GERD (gastroesophageal reflux disease)  Hyperlipidemia  Hypertension  Impaired fasting glucose  Migraines  Sleep apnea  Tobacco abuse   Past  Surgical History:  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 Daun Epstein)  CESAREAN SECTION  ENDOSCOPIC CARPAL TUNNEL RELEASE Left  HYSTEROSCOPY  PALATE / UVULA BIOPSY / EXCISION   Family History:  Arthritis  Mother  Asthma Mother  Diabetes type II Mother  Heart disease Mother  Kidney disease Mother  Lung disease Mother  Pancreatic cancer Mother  Alcohol abuse Father  High blood pressure (Hypertension) Father  Diabetes type II Brother  Breast cancer Maternal Aunt   Social History:   Socioeconomic History:  Marital status: Married  Occupational History  Occupation: Retired  Tobacco Use  Smoking status: Every Day  Current packs/day: 1.50  Average packs/day: 1.5 packs/day for 45.0 years (67.5 ttl pk-yrs)  Types: Cigarettes  Smokeless tobacco: Never  Vaping Use  Vaping status: Never Used  Substance and Sexual Activity  Alcohol use: Never  Comment: rarely  Drug use: Never  Sexual activity: Defer  Partners: Male   Review of Systems:  A comprehensive 14 point ROS was performed, reviewed, and the pertinent orthopaedic findings are documented in the HPI.  Physical Exam:  Vitals:  10/07/23 0935  BP: (!) 140/82  Weight: 82.3 kg (181 lb 6.4 oz)  Height: 154.9 cm (5\' 1" )  PainSc: 0-No pain  PainLoc: Shoulder   General/Constitutional: The patient appears to be well-nourished, well-developed, and in no acute distress. Neuro/Psych: Normal mood and affect, oriented to person, place and time. Eyes: Non-icteric. Pupils are equal, round, and reactive to light, and exhibit synchronous movement. ENT: Unremarkable. Lymphatic: No palpable adenopathy. Respiratory: Lungs clear to auscultation, Normal chest excursion, No wheezes, and Non-labored breathing Cardiovascular: Regular rate and rhythm. No murmurs. and No edema, swelling or tenderness, except as noted in detailed exam. Integumentary: No impressive skin lesions present, except as noted in detailed exam. Musculoskeletal: Unremarkable, except as noted in detailed exam.  Right wrist/hand exam:  Skin inspection around the right wrist and hand is unremarkable. No swelling, erythema, ecchymosis, abrasions, or other skin abnormalities are  identified. She exhibits full active and passive range of motion of the wrist without any pain or catching. She is able to actively flex and extend all digits fully without any pain or triggering. She is neurovascularly intact to all digits of the right hand, other than some slightly decreased sensation to light touch to the tips of the thumb, index, and long fingers. She exhibits a positive Phalen's test on the right, as well as a positive Tinel's over the carpal tunnel on the right.  EMG results: A recent EMG of the right hand and wrist is available for review and has been reviewed by myself. By report, the study demonstrates evidence of moderate to severe carpal tunnel syndrome. This report was reviewed by myself and discussed with the patient and her husband.  Assessment:  1. Carpal tunnel syndrome, right. 2. Type 2 diabetes mellitus with other specified complication.   Plan:  The treatment options were discussed with the patient and her husband. In addition, patient educational materials were provided regarding the diagnosis and treatment options. The patient is quite frustrated both by her left shoulder symptoms and her right wrist/hand symptoms, and is ready to consider more aggressive treatment options for each. Therefore, I have recommended surgical procedures for each issue, specifically an endoscopic right carpal tunnel release and a reverse left total shoulder arthroplasty. The patient and her husband understand that these procedures cannot be done at the same time. Therefore, we will proceed with her doing the endoscopic right  carpal tunnel release first as this will provide the most expedient recovery of the 2 procedures. The procedure was discussed with the patient, as were the potential risks (including bleeding, infection, nerve and/or blood vessel injury, persistent or recurrent pain/paresthesias, weakness of grip, need for further surgery, blood clots, strokes, heart attacks and/or  arhythmias, pneumonia, etc.) and benefits. The patient states her understanding and wishes to proceed. All of the patient's questions and concerns were answered. She can call any time with further concerns. She will follow up post-surgery, routine.    H&P reviewed and patient re-examined. No changes.

## 2023-10-22 NOTE — Anesthesia Procedure Notes (Signed)
 Procedure Name: LMA Insertion Date/Time: 10/22/2023 1:23 PM  Performed by: Zenith Kercheval R, CRNAPre-anesthesia Checklist: Patient identified, Emergency Drugs available, Suction available, Patient being monitored and Timeout performed Patient Re-evaluated:Patient Re-evaluated prior to induction Oxygen Delivery Method: Circle system utilized Preoxygenation: Pre-oxygenation with 100% oxygen Induction Type: IV induction LMA: LMA inserted LMA Size: 3.0 Number of attempts: 1 Tube secured with: Tape Dental Injury: Teeth and Oropharynx as per pre-operative assessment

## 2023-10-23 ENCOUNTER — Encounter: Payer: Self-pay | Admitting: Surgery

## 2023-10-31 ENCOUNTER — Encounter: Payer: Self-pay | Admitting: Pulmonary Disease

## 2023-11-02 NOTE — Anesthesia Postprocedure Evaluation (Signed)
 Anesthesia Post Note  Patient: Monica Hull  Procedure(s) Performed: RELEASE, CARPAL TUNNEL, ENDOSCOPIC (Right: Wrist)  Patient location during evaluation: PACU Anesthesia Type: General Level of consciousness: awake and alert Pain management: pain level controlled Vital Signs Assessment: post-procedure vital signs reviewed and stable Respiratory status: spontaneous breathing, nonlabored ventilation, respiratory function stable and patient connected to nasal cannula oxygen Cardiovascular status: blood pressure returned to baseline and stable Postop Assessment: no apparent nausea or vomiting Anesthetic complications: no   There were no known notable events for this encounter.   Last Vitals:  Vitals:   10/22/23 1500 10/22/23 1517  BP: 133/88 (!) 151/86  Pulse: 63 63  Resp: 19 14  Temp: 36.5 C 36.6 C  SpO2: 97% 99%    Last Pain:  Vitals:   10/22/23 1517  TempSrc: Temporal  PainSc: 0-No pain                 Vanice Genre

## 2023-11-04 DIAGNOSIS — Z419 Encounter for procedure for purposes other than remedying health state, unspecified: Secondary | ICD-10-CM | POA: Diagnosis not present

## 2023-11-04 NOTE — Anesthesia Preprocedure Evaluation (Addendum)
 Anesthesia Evaluation  Patient identified by MRN, date of birth, ID band Patient awake    Reviewed: Allergy & Precautions, NPO status , Patient's Chart, lab work & pertinent test results  History of Anesthesia Complications Negative for: history of anesthetic complications  Airway Mallampati: III  TM Distance: >3 FB Neck ROM: full    Dental  (+) Chipped, Poor Dentition, Missing   Pulmonary neg shortness of breath, asthma , sleep apnea , COPD, Current Smoker and Patient abstained from smoking.    + decreased breath sounds      Cardiovascular Exercise Tolerance: Good hypertension, (-) angina (-) Past MI Normal cardiovascular exam     Neuro/Psych  Headaches PSYCHIATRIC DISORDERS       Neuromuscular disease    GI/Hepatic Neg liver ROS,GERD  Controlled,,  Endo/Other  diabetes    Renal/GU Renal disease     Musculoskeletal  (+) Arthritis ,    Abdominal   Peds  Hematology negative hematology ROS (+)   Anesthesia Other Findings Past Medical History: No date: Arthritis No date: Asthma No date: Carpal tunnel syndrome No date: Chronic kidney disease     Comment:  H/O KIDNEY STONES No date: COPD (chronic obstructive pulmonary disease) (HCC) No date: Diabetes mellitus without complication (HCC)     Comment:  DIET CONTROLLED No date: Elevated liver function tests No date: GERD (gastroesophageal reflux disease) No date: Headache     Comment:  MIGRAINES No date: History of kidney stones No date: Hyperlipidemia No date: Hypertension No date: Impaired fasting glucose No date: Pneumonia No date: Sleep apnea     Comment:  Not using CPAP- referred to sleep center No date: Tobacco abuse  Past Surgical History: No date: CARPAL TUNNEL RELEASE No date: CESAREAN SECTION     Comment:  X2 07/11/2015: HYSTEROSCOPY WITH D & C; N/A     Comment:  Procedure: DILATATION AND CURETTAGE /HYSTEROSCOPY;                Surgeon: Teresa Fender, MD;  Location: [facility] ORS;  Service:              Gynecology;  Laterality: N/A; No date: PALATE / UVULA BIOPSY / EXCISION  BMI    Body Mass Index: 31.86 kg/m      Reproductive/Obstetrics negative OB ROS                              Anesthesia Physical Anesthesia Plan  ASA: 3  Anesthesia Plan: General ETT   Post-op Pain Management: Regional block*   Induction: Intravenous  PONV Risk Score and Plan: Ondansetron , Dexamethasone , Midazolam  and Treatment may vary due to age or medical condition  Airway Management Planned: Oral ETT  Additional Equipment:   Intra-op Plan:   Post-operative Plan: Extubation in OR  Informed Consent: I have reviewed the patients History and Physical, chart, labs and discussed the procedure including the risks, benefits and alternatives for the proposed anesthesia with the patient or authorized representative who has indicated his/her understanding and acceptance.     Dental Advisory Given  Plan Discussed with: Anesthesiologist, CRNA and Surgeon  Anesthesia Plan Comments: (Patient has COPD, 97% on room air today.  Consented for risk of having to spend the night in observation if her saturation is too low after her interscalene nerve block.  Patient voiced assent and would like us  to place the block.   Patient consented for risks of anesthesia including but not limited  to:  - adverse reactions to medications - damage to eyes, teeth, lips or other oral mucosa - nerve damage due to positioning  - sore throat or hoarseness - Damage to heart, brain, nerves, lungs, other parts of body or loss of life  Patient voiced understanding.)         Anesthesia Quick Evaluation

## 2023-11-10 ENCOUNTER — Other Ambulatory Visit: Payer: Self-pay | Admitting: Family Medicine

## 2023-11-12 NOTE — Telephone Encounter (Signed)
Pt needs an OV  

## 2023-11-13 ENCOUNTER — Encounter: Payer: Self-pay | Admitting: *Deleted

## 2023-11-13 ENCOUNTER — Other Ambulatory Visit: Payer: Self-pay | Admitting: Family Medicine

## 2023-11-13 NOTE — Telephone Encounter (Signed)
 Requested medication (s) are due for refill today:   Yes for lisinopril ,  atorvastatin  is historical, Metformin  was D/C'd  Requested medication (s) are on the active medication list:   Yes except atorvastatin  and metformin   Future visit scheduled:   No.  She cancelled her last 2 appts.   MyChart message has been sent that an appt is needed for refills.   Last ordered: Lisinopril  04/24/2023 #90, 1 refill;  Atorvastatin  is historical 08/05/2023;   Metformin  500 mg was discontinued 10/15/2023.  Unable to refill because an OV is due per protocol.    Requested Prescriptions  Pending Prescriptions Disp Refills   lisinopril  (ZESTRIL ) 40 MG tablet [Pharmacy Med Name: LISINOPRIL  40MG  TABLETS] 90 tablet 1    Sig: TAKE 1 TABLET(40 MG) BY MOUTH DAILY     Cardiovascular:  ACE Inhibitors Failed - 11/13/2023  7:51 AM      Failed - Last BP in normal range    BP Readings from Last 1 Encounters:  10/22/23 (!) 151/86         Failed - Valid encounter within last 6 months    Recent Outpatient Visits   None            Passed - Cr in normal range and within 180 days    Creatinine, Ser  Date Value Ref Range Status  10/15/2023 0.72 0.44 - 1.00 mg/dL Final         Passed - K in normal range and within 180 days    Potassium  Date Value Ref Range Status  10/15/2023 3.6 3.5 - 5.1 mmol/L Final         Passed - Patient is not pregnant       atorvastatin  (LIPITOR) 80 MG tablet [Pharmacy Med Name: ATORVASTATIN  80MG  TABLETS] 90 tablet     Sig: TAKE 1 TABLET(80 MG) BY MOUTH DAILY     Cardiovascular:  Antilipid - Statins Failed - 11/13/2023  7:51 AM      Failed - Valid encounter within last 12 months    Recent Outpatient Visits   None            Failed - Lipid Panel in normal range within the last 12 months    Cholesterol, Total  Date Value Ref Range Status  04/24/2023 180 100 - 199 mg/dL Final   Cholesterol Piccolo, Waived  Date Value Ref Range Status  05/23/2018 195 <200 mg/dL Final     Comment:                            Desirable                <200                         Borderline High      200- 239                         High                     >239    LDL Chol Calc (NIH)  Date Value Ref Range Status  04/24/2023 109 (H) 0 - 99 mg/dL Final   HDL  Date Value Ref Range Status  04/24/2023 42 >39 mg/dL Final   Triglycerides  Date Value Ref Range Status  04/24/2023 162 (H) 0 - 149 mg/dL Final  Triglycerides Piccolo,Waived  Date Value Ref Range Status  05/23/2018 183 (H) <150 mg/dL Final    Comment:                            Normal                   <150                         Borderline High     150 - 199                         High                200 - 499                         Very High                >499          Passed - Patient is not pregnant       metFORMIN  (GLUCOPHAGE -XR) 500 MG 24 hr tablet [Pharmacy Med Name: METFORMIN  ER 500MG  24HR TABS] 180 tablet 1    Sig: TAKE 2 TABLETS(1000 MG) BY MOUTH DAILY WITH BREAKFAST     Endocrinology:  Diabetes - Biguanides Failed - 11/13/2023  7:51 AM      Failed - HBA1C is between 0 and 7.9 and within 180 days    Hemoglobin A1C  Date Value Ref Range Status  02/29/2016 6.7  Final   HB A1C (BAYER DCA - WAIVED)  Date Value Ref Range Status  04/24/2023 7.0 (H) 4.8 - 5.6 % Final    Comment:             Prediabetes: 5.7 - 6.4          Diabetes: >6.4          Glycemic control for adults with diabetes: <7.0          Failed - B12 Level in normal range and within 720 days    No results found for: "VITAMINB12"       Failed - Valid encounter within last 6 months    Recent Outpatient Visits   None            Passed - Cr in normal range and within 360 days    Creatinine, Ser  Date Value Ref Range Status  10/15/2023 0.72 0.44 - 1.00 mg/dL Final         Passed - eGFR in normal range and within 360 days    GFR calc Af Amer  Date Value Ref Range Status  08/05/2020 109 >59 mL/min/1.73 Final     Comment:    **In accordance with recommendations from the NKF-ASN Task force,**   [facility] is in the process of updating its eGFR calculation to the   2021 CKD-EPI creatinine equation that estimates kidney function   without a race variable.    GFR, Estimated  Date Value Ref Range Status  10/15/2023 >60 >60 mL/min Final    Comment:    (NOTE) Calculated using the CKD-EPI Creatinine Equation (2021)    eGFR  Date Value Ref Range Status  04/24/2023 99 >59 mL/min/1.73 Final         Passed - CBC within normal limits and completed in the last 12 months  WBC  Date Value Ref Range Status  10/15/2023 7.6 4.0 - 10.5 K/uL Final   RBC  Date Value Ref Range Status  10/15/2023 5.19 (H) 3.87 - 5.11 MIL/uL Final   Hemoglobin  Date Value Ref Range Status  10/15/2023 15.6 (H) 12.0 - 15.0 g/dL Final  86/57/8469 62.9 11.1 - 15.9 g/dL Final   HCT  Date Value Ref Range Status  10/15/2023 46.2 (H) 36.0 - 46.0 % Final   Hematocrit  Date Value Ref Range Status  04/24/2023 44.6 34.0 - 46.6 % Final   MCHC  Date Value Ref Range Status  10/15/2023 33.8 30.0 - 36.0 g/dL Final   [facility]  Date Value Ref Range Status  10/15/2023 30.1 26.0 - 34.0 pg Final   MCV  Date Value Ref Range Status  10/15/2023 89.0 80.0 - 100.0 fL Final  04/24/2023 92 79 - 97 fL Final   No results found for: "PLTCOUNTKUC", "LABPLAT", "POCPLA" RDW  Date Value Ref Range Status  10/15/2023 13.0 11.5 - 15.5 % Final  04/24/2023 12.2 11.7 - 15.4 % Final

## 2023-11-15 NOTE — Telephone Encounter (Signed)
 Requested Prescriptions  Pending Prescriptions Disp Refills   Semaglutide ,0.25 or 0.5MG /DOS, (OZEMPIC , 0.25 OR 0.5 MG/DOSE,) 2 MG/3ML SOPN [Pharmacy Med Name: OZEMPIC  0.25 OR 0.5MG  DOS(2MG /3ML)] 9 mL 0    Sig: INJECT 0.5 MG INTO THE SKIN ONCE A WEEK     Endocrinology:  Diabetes - GLP-1 Receptor Agonists - semaglutide  Failed - 11/15/2023  7:36 AM      Failed - HBA1C in normal range and within 180 days    Hemoglobin A1C  Date Value Ref Range Status  02/29/2016 6.7  Final   HB A1C (BAYER DCA - WAIVED)  Date Value Ref Range Status  04/24/2023 7.0 (H) 4.8 - 5.6 % Final    Comment:             Prediabetes: 5.7 - 6.4          Diabetes: >6.4          Glycemic control for adults with diabetes: <7.0          Failed - Valid encounter within last 6 months    Recent Outpatient Visits   None            Passed - Cr in normal range and within 360 days    Creatinine, Ser  Date Value Ref Range Status  10/15/2023 0.72 0.44 - 1.00 mg/dL Final

## 2023-11-19 ENCOUNTER — Encounter: Payer: Self-pay | Admitting: Gastroenterology

## 2023-11-22 ENCOUNTER — Telehealth: Payer: Self-pay

## 2023-11-22 ENCOUNTER — Encounter: Payer: Self-pay | Admitting: Family Medicine

## 2023-11-22 ENCOUNTER — Ambulatory Visit: Admitting: Family Medicine

## 2023-11-22 ENCOUNTER — Other Ambulatory Visit (HOSPITAL_COMMUNITY): Payer: Self-pay

## 2023-11-22 ENCOUNTER — Other Ambulatory Visit: Payer: Self-pay

## 2023-11-22 VITALS — BP 137/86 | HR 65 | Ht 62.0 in | Wt 181.6 lb

## 2023-11-22 DIAGNOSIS — I10 Essential (primary) hypertension: Secondary | ICD-10-CM

## 2023-11-22 DIAGNOSIS — I700 Atherosclerosis of aorta: Secondary | ICD-10-CM | POA: Diagnosis not present

## 2023-11-22 DIAGNOSIS — E1169 Type 2 diabetes mellitus with other specified complication: Secondary | ICD-10-CM | POA: Diagnosis not present

## 2023-11-22 DIAGNOSIS — E782 Mixed hyperlipidemia: Secondary | ICD-10-CM | POA: Diagnosis not present

## 2023-11-22 DIAGNOSIS — B070 Plantar wart: Secondary | ICD-10-CM | POA: Diagnosis not present

## 2023-11-22 LAB — MICROALBUMIN, URINE WAIVED
Creatinine, Urine Waived: 50 mg/dL (ref 10–300)
Microalb, Ur Waived: 10 mg/L (ref 0–19)

## 2023-11-22 LAB — BAYER DCA HB A1C WAIVED: HB A1C (BAYER DCA - WAIVED): 7.3 % — ABNORMAL HIGH (ref 4.8–5.6)

## 2023-11-22 MED ORDER — METFORMIN HCL ER 500 MG PO TB24
1000.0000 mg | ORAL_TABLET | Freq: Every day | ORAL | 1 refills | Status: DC
  Filled 2023-11-22: qty 180, 90d supply, fill #0

## 2023-11-22 MED ORDER — LISINOPRIL 40 MG PO TABS
40.0000 mg | ORAL_TABLET | Freq: Every day | ORAL | 1 refills | Status: DC
  Filled 2023-11-22: qty 90, 90d supply, fill #0

## 2023-11-22 MED ORDER — PANTOPRAZOLE SODIUM 40 MG PO TBEC
40.0000 mg | DELAYED_RELEASE_TABLET | Freq: Every morning | ORAL | 1 refills | Status: DC
  Filled 2023-11-22: qty 90, 90d supply, fill #0

## 2023-11-22 MED ORDER — SEMAGLUTIDE (1 MG/DOSE) 4 MG/3ML ~~LOC~~ SOPN
1.0000 mg | PEN_INJECTOR | SUBCUTANEOUS | 0 refills | Status: DC
  Filled 2023-11-22: qty 9, 84d supply, fill #0
  Filled 2023-11-26: qty 3, 28d supply, fill #0

## 2023-11-22 MED ORDER — ATORVASTATIN CALCIUM 80 MG PO TABS
80.0000 mg | ORAL_TABLET | Freq: Every day | ORAL | 1 refills | Status: DC
  Filled 2023-11-22 – 2024-01-23 (×2): qty 90, 90d supply, fill #0

## 2023-11-22 NOTE — Assessment & Plan Note (Signed)
 A1c up to 7.3 from 7. Will increase her ozempic  to 1mg  and recheck in 3 months. Call with any concerns. Continue to monitor.

## 2023-11-22 NOTE — Assessment & Plan Note (Signed)
 Will keep BP and cholesterol under good control. Continue to monitor. Call with any concerns.

## 2023-11-22 NOTE — Progress Notes (Signed)
 BP 137/86 (BP Location: Left Arm, Patient Position: Sitting, Cuff Size: Normal)   Pulse 65   Ht 5\' 2"  (1.575 m)   Wt 181 lb 9.6 oz (82.4 kg)   SpO2 96%   BMI 33.22 kg/m    Subjective:    Patient ID: Monica Hull, female    DOB: January 12, 1961, 63 y.o.   MRN: [id number]  HPI: Monica Hull is a 63 y.o. female  Chief Complaint  Patient presents with   Diabetes   Hypertension   Hyperlipidemia   DIABETES Hypoglycemic episodes:no Polydipsia/polyuria: no Visual disturbance: no Chest pain: no Paresthesias: no Glucose Monitoring: no Taking Insulin?: no Blood Pressure Monitoring: not checking Retinal Examination: Up to Date Foot Exam: Up to Date Diabetic Education: Completed Pneumovax: Up to Date Influenza: Up to Date Aspirin: yes  HYPERTENSION / HYPERLIPIDEMIA Satisfied with current treatment? yes Duration of hypertension: chronic BP monitoring frequency: rarely BP medication side effects: no Past BP meds: lisinopril  Duration of hyperlipidemia: chronic Cholesterol medication side effects: no Cholesterol supplements: none Past cholesterol medications: atorvastatin  Medication compliance: excellent compliance Aspirin: yes Recent stressors: no Recurrent headaches: no Visual changes: no Palpitations: no Dyspnea: no Chest pain: no Lower extremity edema: no Dizzy/lightheaded: no  Relevant past medical, surgical, family and social history reviewed and updated as indicated. Interim medical history since our last visit reviewed. Allergies and medications reviewed and updated.  Review of Systems  Constitutional: Negative.   Respiratory:  Positive for cough, shortness of breath and wheezing. Negative for apnea, choking, chest tightness and stridor.   Cardiovascular: Negative.   Gastrointestinal: Negative.   Musculoskeletal: Negative.   Neurological: Negative.   Psychiatric/Behavioral: Negative.      Per HPI unless specifically indicated above      Objective:     BP 137/86 (BP Location: Left Arm, Patient Position: Sitting, Cuff Size: Normal)   Pulse 65   Ht 5\' 2"  (1.575 m)   Wt 181 lb 9.6 oz (82.4 kg)   SpO2 96%   BMI 33.22 kg/m   Wt Readings from Last 3 Encounters:  11/22/23 181 lb 9.6 oz (82.4 kg)  10/15/23 180 lb (81.6 kg)  08/01/23 180 lb 9.6 oz (81.9 kg)    Physical Exam Vitals and nursing note reviewed.  Constitutional:      General: She is not in acute distress.    Appearance: Normal appearance. She is not ill-appearing, toxic-appearing or diaphoretic.  HENT:     Head: Normocephalic and atraumatic.     Right Ear: External ear normal.     Left Ear: External ear normal.     Nose: Nose normal.     Mouth/Throat:     Mouth: Mucous membranes are moist.     Pharynx: Oropharynx is clear.  Eyes:     General: No scleral icterus.       Right Hull: No discharge.        Left Hull: No discharge.     Extraocular Movements: Extraocular movements intact.     Conjunctiva/sclera: Conjunctivae normal.     Pupils: Pupils are equal, round, and reactive to light.  Cardiovascular:     Rate and Rhythm: Normal rate and regular rhythm.     Pulses: Normal pulses.     Heart sounds: Normal heart sounds. No murmur heard.    No friction rub. No gallop.  Pulmonary:     Effort: Pulmonary effort is normal. No respiratory distress.     Breath sounds: Normal breath sounds. No stridor. No wheezing,  rhonchi or rales.  Chest:     Chest wall: No tenderness.  Musculoskeletal:        General: Normal range of motion.     Cervical back: Normal range of motion and neck supple.  Skin:    General: Skin is warm and dry.     Capillary Refill: Capillary refill takes less than 2 seconds.     Coloration: Skin is not jaundiced or pale.     Findings: No bruising, erythema, lesion or rash.  Neurological:     General: No focal deficit present.     Mental Status: She is alert and oriented to person, place, and time. Mental status is at baseline.   Psychiatric:        Mood and Affect: Mood normal.        Behavior: Behavior normal.        Thought Content: Thought content normal.        Judgment: Judgment normal.     Results for orders placed or performed during the hospital encounter of 10/22/23  Glucose, capillary   Collection Time: 10/22/23 11:54 AM  Result Value Ref Range   Glucose-Capillary 136 (H) 70 - 99 mg/dL  Glucose, capillary   Collection Time: 10/22/23  2:12 PM  Result Value Ref Range   Glucose-Capillary 138 (H) 70 - 99 mg/dL      Assessment & Plan:   Problem List Items Addressed This Visit       Cardiovascular and Mediastinum   Hypertension - Primary   Under good control on current regimen. Continue current regimen. Continue to monitor. Call with any concerns. Refills given. Labs drawn today.       Relevant Medications   atorvastatin  (LIPITOR) 80 MG tablet   lisinopril  (ZESTRIL ) 40 MG tablet   Other Relevant Orders   CBC with Differential/Platelet   Comprehensive metabolic panel with GFR   Lipid Panel w/o Chol/HDL Ratio   TSH   Microalbumin, Urine Waived   Aortic atherosclerosis (HCC)   Will keep BP and cholesterol under good control. Continue to monitor. Call with any concerns.       Relevant Medications   atorvastatin  (LIPITOR) 80 MG tablet   lisinopril  (ZESTRIL ) 40 MG tablet   Other Relevant Orders   CBC with Differential/Platelet   Comprehensive metabolic panel with GFR   Lipid Panel w/o Chol/HDL Ratio     Endocrine   Type 2 diabetes mellitus with other specified complication (HCC)   A1c up to 7.3 from 7. Will increase her ozempic  to 1mg  and recheck in 3 months. Call with any concerns. Continue to monitor.       Relevant Medications   atorvastatin  (LIPITOR) 80 MG tablet   lisinopril  (ZESTRIL ) 40 MG tablet   metFORMIN  (GLUCOPHAGE -XR) 500 MG 24 hr tablet   Semaglutide , 1 MG/DOSE, 4 MG/3ML SOPN   Other Relevant Orders   CBC with Differential/Platelet   Comprehensive metabolic panel  with GFR   Lipid Panel w/o Chol/HDL Ratio   Bayer DCA Hb A1c Waived   Microalbumin, Urine Waived     Other   Hyperlipidemia   Under good control on current regimen. Continue current regimen. Continue to monitor. Call with any concerns. Refills given. Labs drawn today.        Relevant Medications   atorvastatin  (LIPITOR) 80 MG tablet   lisinopril  (ZESTRIL ) 40 MG tablet   Other Relevant Orders   CBC with Differential/Platelet   Comprehensive metabolic panel with GFR   Lipid Panel w/o Chol/HDL  Ratio   Other Visit Diagnoses       Plantar warts       Relevant Orders   Ambulatory referral to Podiatry        Follow up plan: Return in about 3 months (around 02/22/2024).

## 2023-11-22 NOTE — Telephone Encounter (Signed)
 Pharmacy Patient Advocate Encounter   Received notification from CoverMyMeds that prior authorization for Ozempic  (1 MG/DOSE) 4MG /3ML pen-injectors is required/requested.   Insurance verification completed.   The patient is insured through [facility] [place] .   Per test claim: PA required; PA submitted to above mentioned insurance via CoverMyMeds Key/confirmation #/EOC (Key: [id number])   Status is pending

## 2023-11-22 NOTE — Assessment & Plan Note (Signed)
 Under good control on current regimen. Continue current regimen. Continue to monitor. Call with any concerns. Refills given. Labs drawn today.

## 2023-11-23 LAB — CBC WITH DIFFERENTIAL/PLATELET
Basophils Absolute: 0.1 10*3/uL (ref 0.0–0.2)
Basos: 1 %
EOS (ABSOLUTE): 0.3 10*3/uL (ref 0.0–0.4)
Eos: 4 %
Hematocrit: 47.8 % — ABNORMAL HIGH (ref 34.0–46.6)
Hemoglobin: 15.7 g/dL (ref 11.1–15.9)
Immature Grans (Abs): 0 10*3/uL (ref 0.0–0.1)
Immature Granulocytes: 0 %
Lymphocytes Absolute: 2.6 10*3/uL (ref 0.7–3.1)
Lymphs: 35 %
MCH: 30.5 pg (ref 26.6–33.0)
MCHC: 32.8 g/dL (ref 31.5–35.7)
MCV: 93 fL (ref 79–97)
Monocytes Absolute: 0.4 10*3/uL (ref 0.1–0.9)
Monocytes: 6 %
Neutrophils Absolute: 4 10*3/uL (ref 1.4–7.0)
Neutrophils: 54 %
Platelets: 231 10*3/uL (ref 150–450)
RBC: 5.15 x10E6/uL (ref 3.77–5.28)
RDW: 13 % (ref 11.7–15.4)
WBC: 7.3 10*3/uL (ref 3.4–10.8)

## 2023-11-23 LAB — COMPREHENSIVE METABOLIC PANEL WITH GFR
ALT: 43 IU/L — ABNORMAL HIGH (ref 0–32)
AST: 35 IU/L (ref 0–40)
Albumin: 4 g/dL (ref 3.9–4.9)
Alkaline Phosphatase: 171 IU/L — ABNORMAL HIGH (ref 44–121)
BUN/Creatinine Ratio: 12 (ref 12–28)
BUN: 9 mg/dL (ref 8–27)
Bilirubin Total: 0.5 mg/dL (ref 0.0–1.2)
CO2: 21 mmol/L (ref 20–29)
Calcium: 9.6 mg/dL (ref 8.7–10.3)
Chloride: 103 mmol/L (ref 96–106)
Creatinine, Ser: 0.74 mg/dL (ref 0.57–1.00)
Globulin, Total: 2.9 g/dL (ref 1.5–4.5)
Glucose: 155 mg/dL — ABNORMAL HIGH (ref 70–99)
Potassium: 4.1 mmol/L (ref 3.5–5.2)
Sodium: 141 mmol/L (ref 134–144)
Total Protein: 6.9 g/dL (ref 6.0–8.5)
eGFR: 91 mL/min/{1.73_m2} (ref >59)

## 2023-11-23 LAB — LIPID PANEL W/O CHOL/HDL RATIO
Cholesterol, Total: 204 mg/dL — ABNORMAL HIGH (ref 100–199)
HDL: 46 mg/dL (ref >39)
LDL Chol Calc (NIH): 132 mg/dL — ABNORMAL HIGH (ref 0–99)
Triglycerides: 145 mg/dL (ref 0–149)
VLDL Cholesterol Cal: 26 mg/dL (ref 5–40)

## 2023-11-23 LAB — TSH: TSH: 1.28 u[IU]/mL (ref 0.450–4.500)

## 2023-11-26 ENCOUNTER — Other Ambulatory Visit: Payer: Self-pay

## 2023-11-26 ENCOUNTER — Other Ambulatory Visit (HOSPITAL_COMMUNITY): Payer: Self-pay

## 2023-11-26 NOTE — Telephone Encounter (Signed)
 Pharmacy Patient Advocate Encounter  Received notification from [facility] Medicaid that Prior Authorization for Ozempic  (1 MG/DOSE) 4MG /3ML pen-injectors has been APPROVED from 5.16.25 to 5.30.26. Ran test claim, Copay is $4.00. This test claim was processed through [facility]- copay amounts may vary at other pharmacies due to pharmacy/plan contracts, or as the patient moves through the different stages of their insurance plan.   PA #/Case ID/Reference #: (Key: [id number])

## 2023-12-02 ENCOUNTER — Ambulatory Visit: Payer: Self-pay | Admitting: Family Medicine

## 2023-12-03 ENCOUNTER — Telehealth: Payer: Self-pay | Admitting: Family Medicine

## 2023-12-03 ENCOUNTER — Other Ambulatory Visit: Payer: Self-pay | Admitting: Family Medicine

## 2023-12-03 DIAGNOSIS — B070 Plantar wart: Secondary | ICD-10-CM

## 2023-12-03 NOTE — Telephone Encounter (Unsigned)
 Copied from CRM [id number]. Topic: Clinical - Medication Refill >> Dec 03, 2023 11:53 AM Zipporah Him wrote: Medication: Semaglutide , 1 MG/DOSE, 4 MG/3ML SOPN  Has the patient contacted their pharmacy? Yes   This is the patient's preferred pharmacy:   [facility] [address] [place] [address] Phone: [phone] Fax: [phone]  Is this the correct pharmacy for this prescription? Yes If no, delete pharmacy and type the correct one.   Has the prescription been filled recently? Yes  Is the patient out of the medication? She needs a refill, she needs 1mg .   Has the patient been seen for an appointment in the last year OR does the patient have an upcoming appointment? Yes  Can we respond through MyChart? Yes  Agent: Please be advised that Rx refills may take up to 3 business days. We ask that you follow-up with your pharmacy.

## 2023-12-03 NOTE — Telephone Encounter (Signed)
 Copied from CRM [id number]. Topic: Referral - Status >> Dec 03, 2023 11:12 AM Crispin Dolphin wrote: Reason for CRM: Patient husband called. States she was supposed to be referred to [facility] but they have not received it. Per chart look likes referral is in system but not sent. They would like it faxed to [phone]. Thank You

## 2023-12-04 MED ORDER — SEMAGLUTIDE (1 MG/DOSE) 4 MG/3ML ~~LOC~~ SOPN: 1.0000 mg | PEN_INJECTOR | SUBCUTANEOUS | 0 refills | Status: DC

## 2023-12-04 NOTE — Telephone Encounter (Signed)
 Different pharmacy. Requested Prescriptions  Pending Prescriptions Disp Refills   Semaglutide , 1 MG/DOSE, 4 MG/3ML SOPN 9 mL 0    Sig: Inject 1 mg as directed once a week.     Endocrinology:  Diabetes - GLP-1 Receptor Agonists - semaglutide  Failed - 12/04/2023  2:22 PM      Failed - HBA1C in normal range and within 180 days    Hemoglobin A1C  Date Value Ref Range Status  02/29/2016 6.7  Final   HB A1C (BAYER DCA - WAIVED)  Date Value Ref Range Status  11/22/2023 7.3 (H) 4.8 - 5.6 % Final    Comment:             Prediabetes: 5.7 - 6.4          Diabetes: >6.4          Glycemic control for adults with diabetes: <7.0          Failed - Valid encounter within last 6 months    Recent Outpatient Visits           1 week ago Primary hypertension   [facility] [place], Megan P, DO              Passed - Cr in normal range and within 360 days    Creatinine, Ser  Date Value Ref Range Status  11/22/2023 0.74 0.57 - 1.00 mg/dL Final

## 2023-12-04 NOTE — Telephone Encounter (Signed)
 Patient aware referral has been sent and she will call them tomorrow to follow up.

## 2023-12-05 ENCOUNTER — Other Ambulatory Visit: Payer: Self-pay

## 2023-12-05 DIAGNOSIS — Z419 Encounter for procedure for purposes other than remedying health state, unspecified: Secondary | ICD-10-CM | POA: Diagnosis not present

## 2023-12-05 MED ORDER — OZEMPIC (0.25 OR 0.5 MG/DOSE) 2 MG/3ML ~~LOC~~ SOPN
0.5000 mg | PEN_INJECTOR | SUBCUTANEOUS | 0 refills | Status: DC
  Filled 2023-12-05: qty 9, 28d supply, fill #0

## 2023-12-05 MED ORDER — ATORVASTATIN CALCIUM 80 MG PO TABS
80.0000 mg | ORAL_TABLET | Freq: Every day | ORAL | 0 refills | Status: DC
  Filled 2023-12-05: qty 90, 90d supply, fill #0

## 2023-12-05 MED ORDER — METFORMIN HCL ER 500 MG PO TB24
1000.0000 mg | ORAL_TABLET | Freq: Every day | ORAL | 0 refills | Status: DC
  Filled 2023-12-05: qty 180, 90d supply, fill #0

## 2023-12-05 MED ORDER — DICLOFENAC SODIUM 75 MG PO TBEC: 75.0000 mg | DELAYED_RELEASE_TABLET | Freq: Two times a day (BID) | ORAL | 2 refills | Status: AC

## 2023-12-05 MED ORDER — PANTOPRAZOLE SODIUM 40 MG PO TBEC: 40.0000 mg | DELAYED_RELEASE_TABLET | Freq: Two times a day (BID) | ORAL | 3 refills | Status: DC

## 2023-12-05 MED ORDER — LINACLOTIDE 290 MCG PO CAPS
290.0000 ug | ORAL_CAPSULE | Freq: Every day | ORAL | 6 refills | Status: AC
  Filled 2024-01-23: qty 30, 30d supply, fill #0
  Filled 2024-05-11: qty 30, 30d supply, fill #1
  Filled 2024-07-01: qty 30, 30d supply, fill #2

## 2023-12-05 MED ORDER — EZETIMIBE 10 MG PO TABS: 10.0000 mg | ORAL_TABLET | Freq: Every day | ORAL | 2 refills | Status: DC

## 2023-12-05 MED ORDER — ALBUTEROL SULFATE HFA 108 (90 BASE) MCG/ACT IN AERS
2.0000 | INHALATION_SPRAY | Freq: Four times a day (QID) | RESPIRATORY_TRACT | 6 refills | Status: DC | PRN
Start: 2023-04-24 — End: 2024-05-12

## 2023-12-05 MED ORDER — LISINOPRIL 40 MG PO TABS: 40.0000 mg | ORAL_TABLET | Freq: Every day | ORAL | 0 refills | Status: DC

## 2023-12-06 NOTE — Addendum Note (Signed)
 Addended by: Devereaux Grayson M on: 12/06/2023 04:44 PM   Modules accepted: Orders

## 2023-12-08 NOTE — Addendum Note (Signed)
 Addended by: Solomon Dupre on: 12/08/2023 08:04 PM   Modules accepted: Orders

## 2023-12-09 ENCOUNTER — Other Ambulatory Visit: Payer: Self-pay | Admitting: Surgery

## 2023-12-09 DIAGNOSIS — M19012 Primary osteoarthritis, left shoulder: Secondary | ICD-10-CM

## 2023-12-12 ENCOUNTER — Other Ambulatory Visit: Payer: Self-pay

## 2023-12-12 ENCOUNTER — Encounter: Payer: Self-pay | Admitting: Gastroenterology

## 2023-12-12 ENCOUNTER — Encounter
Admission: RE | Disposition: A | Discharge status: Home / Self Care | Payer: Self-pay | Source: Home / Self Care | Attending: Gastroenterology

## 2023-12-12 ENCOUNTER — Ambulatory Visit: Admitting: Anesthesiology

## 2023-12-12 ENCOUNTER — Ambulatory Visit
Admission: RE | Admit: 2023-12-12 | Discharge: 2023-12-12 | Disposition: A | Discharge status: Home / Self Care | Attending: Gastroenterology | Admitting: Gastroenterology

## 2023-12-12 DIAGNOSIS — K635 Polyp of colon: Secondary | ICD-10-CM | POA: Diagnosis not present

## 2023-12-12 DIAGNOSIS — Z79899 Other long term (current) drug therapy: Secondary | ICD-10-CM | POA: Diagnosis not present

## 2023-12-12 DIAGNOSIS — K649 Unspecified hemorrhoids: Secondary | ICD-10-CM | POA: Diagnosis not present

## 2023-12-12 DIAGNOSIS — G4733 Obstructive sleep apnea (adult) (pediatric): Secondary | ICD-10-CM | POA: Insufficient documentation

## 2023-12-12 DIAGNOSIS — K3189 Other diseases of stomach and duodenum: Secondary | ICD-10-CM | POA: Diagnosis not present

## 2023-12-12 DIAGNOSIS — K319 Disease of stomach and duodenum, unspecified: Secondary | ICD-10-CM | POA: Diagnosis not present

## 2023-12-12 DIAGNOSIS — K219 Gastro-esophageal reflux disease without esophagitis: Secondary | ICD-10-CM | POA: Insufficient documentation

## 2023-12-12 DIAGNOSIS — K2970 Gastritis, unspecified, without bleeding: Secondary | ICD-10-CM | POA: Diagnosis not present

## 2023-12-12 DIAGNOSIS — I10 Essential (primary) hypertension: Secondary | ICD-10-CM | POA: Diagnosis not present

## 2023-12-12 DIAGNOSIS — D120 Benign neoplasm of cecum: Secondary | ICD-10-CM | POA: Diagnosis not present

## 2023-12-12 DIAGNOSIS — R1310 Dysphagia, unspecified: Secondary | ICD-10-CM | POA: Diagnosis not present

## 2023-12-12 DIAGNOSIS — K2100 Gastro-esophageal reflux disease with esophagitis, without bleeding: Secondary | ICD-10-CM | POA: Diagnosis not present

## 2023-12-12 DIAGNOSIS — Z7985 Long-term (current) use of injectable non-insulin antidiabetic drugs: Secondary | ICD-10-CM | POA: Diagnosis not present

## 2023-12-12 DIAGNOSIS — I129 Hypertensive chronic kidney disease with stage 1 through stage 4 chronic kidney disease, or unspecified chronic kidney disease: Secondary | ICD-10-CM | POA: Diagnosis not present

## 2023-12-12 DIAGNOSIS — K529 Noninfective gastroenteritis and colitis, unspecified: Secondary | ICD-10-CM | POA: Diagnosis not present

## 2023-12-12 DIAGNOSIS — K644 Residual hemorrhoidal skin tags: Secondary | ICD-10-CM | POA: Insufficient documentation

## 2023-12-12 DIAGNOSIS — D122 Benign neoplasm of ascending colon: Secondary | ICD-10-CM | POA: Insufficient documentation

## 2023-12-12 DIAGNOSIS — F17210 Nicotine dependence, cigarettes, uncomplicated: Secondary | ICD-10-CM | POA: Diagnosis not present

## 2023-12-12 DIAGNOSIS — Z1211 Encounter for screening for malignant neoplasm of colon: Secondary | ICD-10-CM | POA: Insufficient documentation

## 2023-12-12 DIAGNOSIS — K2289 Other specified disease of esophagus: Secondary | ICD-10-CM | POA: Diagnosis not present

## 2023-12-12 DIAGNOSIS — E1122 Type 2 diabetes mellitus with diabetic chronic kidney disease: Secondary | ICD-10-CM | POA: Diagnosis not present

## 2023-12-12 DIAGNOSIS — N189 Chronic kidney disease, unspecified: Secondary | ICD-10-CM | POA: Insufficient documentation

## 2023-12-12 HISTORY — PX: POLYPECTOMY: SHX149

## 2023-12-12 HISTORY — PX: COLONOSCOPY: SHX5424

## 2023-12-12 HISTORY — PX: ESOPHAGOGASTRODUODENOSCOPY: SHX5428

## 2023-12-12 LAB — GLUCOSE, CAPILLARY: Glucose-Capillary: 157 mg/dL — ABNORMAL HIGH (ref 70–99)

## 2023-12-12 SURGERY — COLONOSCOPY
Anesthesia: General

## 2023-12-12 MED ORDER — SODIUM CHLORIDE 0.9 % IV SOLN: INTRAVENOUS | Status: DC

## 2023-12-12 MED ORDER — PROPOFOL 1000 MG/100ML IV EMUL
INTRAVENOUS | Status: AC
  Filled 2023-12-12: qty 100

## 2023-12-12 MED ORDER — DEXMEDETOMIDINE HCL IN NACL 80 MCG/20ML IV SOLN
INTRAVENOUS | Status: DC | PRN
  Administered 2023-12-12: 20 ug via INTRAVENOUS

## 2023-12-12 MED ORDER — PROPOFOL 10 MG/ML IV BOLUS
INTRAVENOUS | Status: DC | PRN
  Administered 2023-12-12: 30 mg via INTRAVENOUS
  Administered 2023-12-12: 50 mg via INTRAVENOUS

## 2023-12-12 MED ORDER — LIDOCAINE HCL (CARDIAC) PF 100 MG/5ML IV SOSY
PREFILLED_SYRINGE | INTRAVENOUS | Status: DC | PRN
  Administered 2023-12-12: 60 mg via INTRAVENOUS

## 2023-12-12 MED ORDER — GLYCOPYRROLATE 0.2 MG/ML IJ SOLN
INTRAMUSCULAR | Status: AC
  Filled 2023-12-12: qty 9

## 2023-12-12 MED ORDER — LIDOCAINE HCL (PF) 2 % IJ SOLN
INTRAMUSCULAR | Status: AC
  Filled 2023-12-12: qty 5

## 2023-12-12 MED ORDER — PROPOFOL 500 MG/50ML IV EMUL
INTRAVENOUS | Status: DC | PRN
  Administered 2023-12-12: 75 ug/kg/min via INTRAVENOUS

## 2023-12-12 MED ORDER — DEXMEDETOMIDINE HCL IN NACL 80 MCG/20ML IV SOLN
INTRAVENOUS | Status: AC
  Filled 2023-12-12: qty 20

## 2023-12-12 MED ORDER — GLYCOPYRROLATE 0.2 MG/ML IJ SOLN
INTRAMUSCULAR | Status: DC | PRN
  Administered 2023-12-12: .2 mg via INTRAVENOUS

## 2023-12-12 NOTE — Anesthesia Postprocedure Evaluation (Signed)
 Anesthesia Post Note  Patient: Monica Hull  Procedure(s) Performed: COLONOSCOPY EGD (ESOPHAGOGASTRODUODENOSCOPY) POLYPECTOMY, INTESTINE  Patient location during evaluation: PACU Anesthesia Type: General Level of consciousness: awake and alert Pain management: pain level controlled Vital Signs Assessment: post-procedure vital signs reviewed and stable Respiratory status: spontaneous breathing, nonlabored ventilation and respiratory function stable Cardiovascular status: blood pressure returned to baseline and stable Postop Assessment: no apparent nausea or vomiting Anesthetic complications: no   No notable events documented.   Last Vitals:  Vitals:   12/12/23 0831 12/12/23 0838  BP: (!) 96/56 105/68  Pulse: 78 75  Resp: (!) 22 20  Temp:    SpO2: 98% 97%    Last Pain:  Vitals:   12/12/23 0838  TempSrc:   PainSc: 0-No pain                 Baltazar Bonier

## 2023-12-12 NOTE — Anesthesia Preprocedure Evaluation (Addendum)
 Anesthesia Evaluation  Patient identified by MRN, date of birth, ID band Patient awake    Reviewed: Allergy & Precautions, NPO status , Patient's Chart, lab work & pertinent test results  History of Anesthesia Complications Negative for: history of anesthetic complications  Airway Mallampati: III  TM Distance: >3 FB Neck ROM: full    Dental  (+) Edentulous Lower, Edentulous Upper   Pulmonary neg shortness of breath, asthma , sleep apnea , COPD, Current Smoker and Patient abstained from smoking.    + decreased breath sounds      Cardiovascular Exercise Tolerance: Good hypertension, (-) angina (-) Past MI Normal cardiovascular exam     Neuro/Psych  Headaches PSYCHIATRIC DISORDERS       Neuromuscular disease    GI/Hepatic Neg liver ROS,GERD  Controlled,,  Endo/Other  diabetes, Type 2    Renal/GU Renal disease     Musculoskeletal  (+) Arthritis ,    Abdominal  (+) + obese  Peds  Hematology negative hematology ROS (+)   Anesthesia Other Findings Past Medical History: No date: Arthritis No date: Asthma No date: Carpal tunnel syndrome No date: Chronic kidney disease     Comment:  H/O KIDNEY STONES No date: COPD (chronic obstructive pulmonary disease) (HCC) No date: Diabetes mellitus without complication (HCC)     Comment:  DIET CONTROLLED No date: Elevated liver function tests No date: GERD (gastroesophageal reflux disease) No date: Headache     Comment:  MIGRAINES No date: History of kidney stones No date: Hyperlipidemia No date: Hypertension No date: Impaired fasting glucose No date: Pneumonia No date: Sleep apnea     Comment:  Not using CPAP- referred to sleep center No date: Tobacco abuse  Past Surgical History: No date: CARPAL TUNNEL RELEASE No date: CESAREAN SECTION     Comment:  X2 07/11/2015: HYSTEROSCOPY WITH D & C; N/A     Comment:  Procedure: DILATATION AND CURETTAGE /HYSTEROSCOPY;                 Surgeon: Teresa Fender, MD;  Location: [facility] ORS;  Service:              Gynecology;  Laterality: N/A; No date: PALATE / UVULA BIOPSY / EXCISION  BMI    Body Mass Index: 31.86 kg/m      Reproductive/Obstetrics negative OB ROS                             Anesthesia Physical Anesthesia Plan  ASA: 3  Anesthesia Plan: General   Post-op Pain Management: Minimal or no pain anticipated   Induction: Intravenous  PONV Risk Score and Plan: TIVA  Airway Management Planned: Natural Airway  Additional Equipment:   Intra-op Plan:   Post-operative Plan:   Informed Consent: I have reviewed the patients History and Physical, chart, labs and discussed the procedure including the risks, benefits and alternatives for the proposed anesthesia with the patient or authorized representative who has indicated his/her understanding and acceptance.     Dental Advisory Given  Plan Discussed with: Anesthesiologist, CRNA and Surgeon  Anesthesia Plan Comments:         Anesthesia Quick Evaluation

## 2023-12-12 NOTE — Op Note (Signed)
 [facility] Patient Name: Monica Hull Procedure Date: 12/12/2023 7:13 AM MRN: [id number] Account #: [id number] Date of Birth: 1961/05/26 Admit Type: Outpatient Age: 63 Room: [facility] ENDO ROOM 1 Gender: Female Note Status: Finalized Instrument Name: Colonoscope [id number] Procedure:             Colonoscopy Indications:           High risk colon cancer surveillance: Personal history                         of colonic polyps Providers:             Quintin Buckle DO, DO Referring MD:          Solomon Dupre (Referring MD) Medicines:             Monitored Anesthesia Care Complications:         No immediate complications. Estimated blood loss:                         Minimal. Procedure:             Pre-Anesthesia Assessment:                        - Prior to the procedure, a History and Physical was                         performed, and patient medications and allergies were                         reviewed. The patient is competent. The risks and                         benefits of the procedure and the sedation options and                         risks were discussed with the patient. All questions                         were answered and informed consent was obtained.                         Patient identification and proposed procedure were                         verified by the physician, the nurse, the anesthetist                         and the technician in the endoscopy suite. Mental                         Status Examination: alert and oriented. Airway                         Examination: normal oropharyngeal airway and neck                         mobility. Respiratory Examination: clear to  auscultation. CV Examination: RRR, no murmurs, no S3                         or S4. Prophylactic Antibiotics: The patient does not                         require prophylactic antibiotics. Prior                          Anticoagulants: The patient has taken no anticoagulant                         or antiplatelet agents. ASA Grade Assessment: III - A                         patient with severe systemic disease. After reviewing                         the risks and benefits, the patient was deemed in                         satisfactory condition to undergo the procedure. The                         anesthesia plan was to use monitored anesthesia care                         (MAC). Immediately prior to administration of                         medications, the patient was re-assessed for adequacy                         to receive sedatives. The heart rate, respiratory                         rate, oxygen saturations, blood pressure, adequacy of                         pulmonary ventilation, and response to care were                         monitored throughout the procedure. The physical                         status of the patient was re-assessed after the                         procedure.                        After obtaining informed consent, the colonoscope was                         passed under direct vision. Throughout the procedure,                         the patient's blood pressure, pulse, and oxygen  saturations were monitored continuously. The                         Colonoscope was introduced through the anus and                         advanced to the the cecum, identified by appendiceal                         orifice and ileocecal valve. The colonoscopy was                         performed without difficulty. The patient tolerated                         the procedure well. The quality of the bowel                         preparation was evaluated using the BBPS [facility] Bowel                         Preparation Scale) with scores of: Right Colon = 3,                         Transverse Colon = 3 and Left Colon = 3 (entire mucosa                         seen well  with no residual staining, small fragments                         of stool or opaque liquid). The total BBPS score                         equals 9. The ileocecal valve, appendiceal orifice,                         and rectum were photographed. Findings:      Skin tags were found on perianal exam.      The digital rectal exam was normal. Pertinent negatives include normal       sphincter tone.      Four sessile polyps were found in the descending colon (2) and cecum       (2). The polyps were 1 to 2 mm in size. These polyps were removed with a       jumbo cold forceps. Resection and retrieval were complete. Estimated       blood loss was minimal.      A 3 to 4 mm polyp was found in the ascending colon. The polyp was       sessile. The polyp was removed with a cold snare. Resection and       retrieval were complete. Estimated blood loss was minimal.      A localized area of mildly granular mucosa was found in the cecum.       Biopsies were taken with a cold forceps for histology. Estimated blood       loss was minimal.      The exam was otherwise without abnormality on direct and retroflexion  views. Impression:            - Perianal skin tags found on perianal exam.                        - Four 1 to 2 mm polyps in the descending colon and in                         the cecum, removed with a jumbo cold forceps. Resected                         and retrieved.                        - One 3 to 4 mm polyp in the ascending colon, removed                         with a cold snare. Resected and retrieved.                        - Granular mucosa in the cecum. Biopsied.                        - The examination was otherwise normal on direct and                         retroflexion views. Recommendation:        - Patient has a contact number available for                         emergencies. The signs and symptoms of potential                         delayed complications were discussed  with the patient.                         Return to normal activities tomorrow. Written                         discharge instructions were provided to the patient.                        - Discharge patient to home.                        - Resume previous diet.                        - Continue present medications.                        - No ibuprofen, naproxen, or other non-steroidal                         anti-inflammatory drugs for 5 days after polyp removal.                        - Await pathology results.                        -  Repeat colonoscopy for surveillance based on                         pathology results.                        - Return to referring physician as previously                         scheduled.                        - The findings and recommendations were discussed with                         the patient. Procedure Code(s):     --- Professional ---                        [phone], Colonoscopy, flexible; with removal of                         tumor(s), polyp(s), or other lesion(s) by snare                         technique                        45380, 59, Colonoscopy, flexible; with biopsy, single                         or multiple Diagnosis Code(s):     --- Professional ---                        Z86.010, Personal history of colonic polyps                        D12.4, Benign neoplasm of descending colon                        D12.0, Benign neoplasm of cecum                        D12.2, Benign neoplasm of ascending colon                        K63.89, Other specified diseases of intestine                        K64.4, Residual hemorrhoidal skin tags CPT copyright 2022 American Medical Association. All rights reserved. The codes documented in this report are preliminary and upon coder review may  be revised to meet current compliance requirements. Attending Participation:      I personally performed the entire procedure. Polo Brisk, DO Quintin Buckle DO, DO 12/12/2023 8:25:55 AM This report has been signed electronically. Number of Addenda: 0 Note Initiated On: 12/12/2023 7:13 AM Scope Withdrawal Time: 0 hours 16 minutes 50 seconds  Total Procedure Duration: 0 hours 20 minutes 50 seconds  Estimated Blood Loss:  Estimated blood loss was minimal.      [facility]

## 2023-12-12 NOTE — H&P (Signed)
 Pre-Procedure H&P   Patient ID: Monica Hull is a 63 y.o. female.  Gastroenterology Provider: Quintin Buckle, DO  Referring Provider: Laquetta Plank, PA PCP: Solomon Dupre, DO  Date: 12/12/2023  HPI [name] is a 63 y.o. female who presents today for Esophagogastroduodenoscopy and Colonoscopy for GERD, dysphagia, personal history of colon polyps, constipation .  Patient reports bowel movement every 3 days without melena or hematochezia.  Occasional abdominal discomfort that is relieved by bowel movement.  Last colonoscopy in 2016 with polyps with a 5-year follow-up recommended  Dysphagia to some solid foods.  No issues with pills liquids or odynophagia.  Longstanding reflux that is controlled by PPI  Patient last took her Ozempic  2 weeks ago  Hemoglobin 15.7 MCV 93 platelets 231,000 creatinine 0.7   Past Medical History:  Diagnosis Date   Aortic atherosclerosis (HCC)    Arthritis    Asthma    Bilateral carotid artery disease (HCC)    CAD (coronary artery disease)    Carpal tunnel syndrome    Chronic kidney disease    Cirrhosis (HCC)    COPD (chronic obstructive pulmonary disease) (HCC)    DDD (degenerative disc disease), cervical    Elevated liver function tests    GERD (gastroesophageal reflux disease)    Hepatic steatosis    History of kidney stones    Hyperlipidemia    Hypertension    Impaired fasting glucose    Long-term use of aspirin therapy    Migraines    OSA (obstructive sleep apnea)    a.) no nocturnal PAP therapy   Pneumonia    Tobacco abuse    Type 2 diabetes with complication (HCC)     Past Surgical History:  Procedure Laterality Date   CARPAL TUNNEL RELEASE Left    CARPAL TUNNEL RELEASE Right 10/22/2023   Procedure: RELEASE, CARPAL TUNNEL, ENDOSCOPIC;  Surgeon: Elner Hahn, MD;  Location: [facility] ORS;  Service: Orthopedics;  Laterality: Right;   CESAREAN SECTION     X2   COLONOSCOPY WITH PROPOFOL  N/A  10/10/2023   Procedure: COLONOSCOPY WITH PROPOFOL ;  Surgeon: Quintin Buckle, DO;  Location: [facility] ENDOSCOPY;  Service: Gastroenterology;  Laterality: N/A;  DM   DILATION AND CURETTAGE OF UTERUS     ESOPHAGOGASTRODUODENOSCOPY (EGD) WITH PROPOFOL  N/A 10/10/2023   Procedure: ESOPHAGOGASTRODUODENOSCOPY (EGD) WITH PROPOFOL ;  Surgeon: Quintin Buckle, DO;  Location: [facility] ENDOSCOPY;  Service: Gastroenterology;  Laterality: N/A;   HYSTEROSCOPY WITH D & C N/A 07/11/2015   Procedure: DILATATION AND CURETTAGE /HYSTEROSCOPY;  Surgeon: Teresa Fender, MD;  Location: [facility] ORS;  Service: Gynecology;  Laterality: N/A;   PALATE / UVULA BIOPSY / EXCISION     REVERSE SHOULDER ARTHROPLASTY Right 10/12/2021   Procedure: REVERSE TOTAL SHOULDER ARTHROPLASTY WITH BICEPS TENODESIS.;  Surgeon: Elner Hahn, MD;  Location: [facility] ORS;  Service: Orthopedics;  Laterality: Right;   TONSILLECTOMY     childhood    Family History Mother- pancreatic cancer No other h/o GI disease or malignancy  Review of Systems  Constitutional:  Negative for activity change, appetite change, chills, diaphoresis, fatigue, fever and unexpected weight change.  HENT:  Positive for trouble swallowing. Negative for voice change.   Respiratory:  Negative for shortness of breath and wheezing.   Cardiovascular:  Negative for chest pain, palpitations and leg swelling.  Gastrointestinal:  Positive for constipation. Negative for abdominal distention, abdominal pain, anal bleeding, blood in stool, diarrhea, nausea, rectal pain and vomiting.  Musculoskeletal:  Negative  for arthralgias and myalgias.  Skin:  Negative for color change and pallor.  Neurological:  Negative for dizziness, syncope and weakness.  Psychiatric/Behavioral:  Negative for confusion.   All other systems reviewed and are negative.    Medications No current facility-administered medications on file prior to encounter.   Current Outpatient Medications on File Prior to  Encounter  Medication Sig Dispense Refill   aspirin EC 81 MG tablet Take 81 mg by mouth daily. Swallow whole.     celecoxib (CELEBREX) 200 MG capsule Take 200 mg by mouth 2 (two) times daily as needed for moderate pain (pain score 4-6).     cetirizine  (ZYRTEC ) 10 MG tablet Take 1 tablet (10 mg total) by mouth daily.     albuterol  (PROVENTIL ) (2.5 MG/3ML) 0.083% nebulizer solution Take 3 mLs (2.5 mg total) by nebulization every 6 (six) hours as needed for wheezing or shortness of breath. 150 mL 1   budesonide -glycopyrrolate -formoterol  (BREZTRI  AEROSPHERE) 160-9-4.8 MCG/ACT AERO inhaler Inhale 2 puffs into the lungs in the morning and at bedtime. 10.7 g 11   diclofenac  Sodium (VOLTAREN ) 1 % GEL Apply 2 g topically daily as needed (knee pain).     fluticasone  (FLONASE ) 50 MCG/ACT nasal spray Place 2 sprays into both nostrils daily as needed for allergies. 16 g 12   levalbuterol  (XOPENEX  HFA) 45 MCG/ACT inhaler Inhale 2 puffs into the lungs every 6 (six) hours as needed for wheezing or shortness of breath. 15 g 3   LINZESS  290 MCG CAPS capsule Take 290 mcg by mouth daily.     Spacer/Aero-Holding Chambers (AEROCHAMBER MV) inhaler Use as instructed 1 each 0   trolamine salicylate (ASPERCREME) 10 % cream Apply 1 application  topically as needed for muscle pain.      Pertinent medications related to GI and procedure were reviewed by me with the patient prior to the procedure   Current Facility-Administered Medications:    0.9 %  sodium chloride  infusion, , Intravenous, Continuous, Quintin Buckle, DO  sodium chloride          Allergies  Allergen Reactions   Advair Hfa [Fluticasone -Salmeterol] Rash    Rash and thrush   Anoro Ellipta  [Umeclidinium-Vilanterol] Rash    Causes patients tongue to break out   Biaxin [Clarithromycin] Nausea And Vomiting and Rash   Oxycodone  Rash   Allergies were reviewed by me prior to the procedure  Objective   Body mass index is 32.19 kg/m. Vitals:    12/12/23 0716  BP: 138/86  Pulse: (!) 59  Resp: 16  Temp: (!) 97.1 F (36.2 C)  TempSrc: Temporal  SpO2: 97%  Weight: 79.8 kg     Physical Exam Vitals and nursing note reviewed.  Constitutional:      General: She is not in acute distress.    Appearance: Normal appearance. She is not ill-appearing, toxic-appearing or diaphoretic.  HENT:     Head: Normocephalic and atraumatic.     Nose: Nose normal.     Mouth/Throat:     Mouth: Mucous membranes are moist.     Pharynx: Oropharynx is clear.     Comments: Edentulous  Eyes:     General: No scleral icterus.    Extraocular Movements: Extraocular movements intact.    Cardiovascular:     Rate and Rhythm: Regular rhythm. Bradycardia present.     Heart sounds: Normal heart sounds. No murmur heard.    No friction rub. No gallop.  Pulmonary:     Effort: Pulmonary effort is normal. No  respiratory distress.     Breath sounds: Normal breath sounds. No wheezing, rhonchi or rales.  Abdominal:     General: Abdomen is flat. Bowel sounds are normal. There is no distension.     Palpations: Abdomen is soft.     Tenderness: There is no abdominal tenderness. There is no guarding or rebound.   Musculoskeletal:     Cervical back: Neck supple.     Right lower leg: No edema.     Left lower leg: No edema.   Skin:    General: Skin is warm and dry.     Coloration: Skin is not jaundiced or pale.   Neurological:     General: No focal deficit present.     Mental Status: She is alert and oriented to person, place, and time. Mental status is at baseline.   Psychiatric:        Mood and Affect: Mood normal.        Behavior: Behavior normal.        Thought Content: Thought content normal.        Judgment: Judgment normal.      Assessment:  Ms. Lanore Renderos is a 63 y.o. female  who presents today for Esophagogastroduodenoscopy and Colonoscopy for GERD, dysphagia, personal history of colon polyps, constipation .  Plan:   Esophagogastroduodenoscopy and Colonoscopy with possible intervention today  Esophagogastroduodenoscopy and Colonoscopy with possible biopsy, control of bleeding, polypectomy, and interventions as necessary has been discussed with the patient/patient representative. Informed consent was obtained from the patient/patient representative after explaining the indication, nature, and risks of the procedure including but not limited to death, bleeding, perforation, missed neoplasm/lesions, cardiorespiratory compromise, and reaction to medications. Opportunity for questions was given and appropriate answers were provided. Patient/patient representative has verbalized understanding is amenable to undergoing the procedure.   Quintin Buckle, DO  [facility] Gastroenterology  Portions of the record may have been created with voice recognition software. Occasional wrong-word or 'sound-a-like' substitutions may have occurred due to the inherent limitations of voice recognition software.  Read the chart carefully and recognize, using context, where substitutions may have occurred.

## 2023-12-12 NOTE — Transfer of Care (Signed)
 Immediate Anesthesia Transfer of Care Note  Patient: Monica Hull  Procedure(s) Performed: COLONOSCOPY EGD (ESOPHAGOGASTRODUODENOSCOPY) POLYPECTOMY, INTESTINE  Patient Location: PACU  Anesthesia Type:General  Level of Consciousness: sedated  Airway & Oxygen Therapy: Patient Spontanous Breathing  Post-op Assessment: Report given to RN and Post -op Vital signs reviewed and stable  Post vital signs: Reviewed and stable  Last Vitals:  Vitals Value Taken Time  BP 109/63 12/12/23 08:19  Temp 35.7 C 12/12/23 08:18  Pulse 80 12/12/23 08:19  Resp 19 12/12/23 08:19  SpO2 96 % 12/12/23 08:19  Vitals shown include unfiled device data.  Last Pain:  Vitals:   12/12/23 0818  TempSrc: Temporal  PainSc: 0-No pain         Complications: No notable events documented.

## 2023-12-12 NOTE — Interval H&P Note (Signed)
 History and Physical Interval Note: Preprocedure H&P from 12/12/23  was reviewed and there was no interval change after seeing and examining the patient.  Written consent was obtained from the patient after discussion of risks, benefits, and alternatives. Patient has consented to proceed with Esophagogastroduodenoscopy and Colonoscopy with possible intervention   12/12/2023 7:33 AM  Monica Hull  has presented today for surgery, with the diagnosis of Chronic constipation (K59.09) Gastroesophageal reflux disease, unspecified whether esophagitis present (K21.9) Esophageal dysphagia (R13.19).  The various methods of treatment have been discussed with the patient and family. After consideration of risks, benefits and other options for treatment, the patient has consented to  Procedure(s): COLONOSCOPY (N/A) EGD (ESOPHAGOGASTRODUODENOSCOPY) (N/A) as a surgical intervention.  The patient's history has been reviewed, patient examined, no change in status, stable for surgery.  I have reviewed the patient's chart and labs.  Questions were answered to the patient's satisfaction.     Quintin Buckle

## 2023-12-12 NOTE — Op Note (Signed)
 [facility] Gastroenterology Patient Name: Monica Hull Procedure Date: 12/12/2023 7:13 AM MRN: [id number] Account #: [id number] Date of Birth: 1961/01/30 Admit Type: Outpatient Age: 63 Room: [facility] ENDO ROOM 1 Gender: Female Note Status: Finalized Instrument Name: Upper Endoscope [id number] Procedure:             Upper GI endoscopy Indications:           Dysphagia, Suspected gastro-esophageal reflux disease Providers:             Quintin Buckle DO, DO Referring MD:          Solomon Dupre (Referring MD) Medicines:             Monitored Anesthesia Care Complications:         No immediate complications. Estimated blood loss:                         Minimal. Procedure:             Pre-Anesthesia Assessment:                        - Prior to the procedure, a History and Physical was                         performed, and patient medications and allergies were                         reviewed. The patient is competent. The risks and                         benefits of the procedure and the sedation options and                         risks were discussed with the patient. All questions                         were answered and informed consent was obtained.                         Patient identification and proposed procedure were                         verified by the physician, the nurse, the anesthetist                         and the technician in the endoscopy suite. Mental                         Status Examination: alert and oriented. Airway                         Examination: normal oropharyngeal airway and neck                         mobility. Respiratory Examination: clear to                         auscultation. CV Examination: RRR, no murmurs, no S3  or S4. Prophylactic Antibiotics: The patient does not                         require prophylactic antibiotics. Prior                         Anticoagulants: The patient has  taken no anticoagulant                         or antiplatelet agents. ASA Grade Assessment: III - A                         patient with severe systemic disease. After reviewing                         the risks and benefits, the patient was deemed in                         satisfactory condition to undergo the procedure. The                         anesthesia plan was to use monitored anesthesia care                         (MAC). Immediately prior to administration of                         medications, the patient was re-assessed for adequacy                         to receive sedatives. The heart rate, respiratory                         rate, oxygen saturations, blood pressure, adequacy of                         pulmonary ventilation, and response to care were                         monitored throughout the procedure. The physical                         status of the patient was re-assessed after the                         procedure.                        After obtaining informed consent, the endoscope was                         passed under direct vision. Throughout the procedure,                         the patient's blood pressure, pulse, and oxygen                         saturations were monitored continuously. The  Endosonoscope was introduced through the mouth, and                         advanced to the third part of duodenum. The upper GI                         endoscopy was accomplished without difficulty. The                         patient tolerated the procedure well. Findings:      The duodenal bulb, first portion of the duodenum, second portion of the       duodenum and third portion of the duodenum were normal. Estimated blood       loss: none.      Localized moderate inflammation characterized by adherent blood,       erosions, erythema, friability and shallow ulcerations was found in the       gastric antrum. Biopsies were taken with  a cold forceps for Helicobacter       pylori testing. Estimated blood loss was minimal.      The exam of the stomach was otherwise normal.      The Z-line was regular. Estimated blood loss: none.      Esophagogastric landmarks were identified: the gastroesophageal junction       was found at 36 cm from the incisors.      Normal mucosa was found in the entire esophagus. Biopsies were obtained       from the proximal and distal esophagus with cold forceps for histology       of suspected eosinophilic esophagitis. Estimated blood loss was minimal.      No endoscopic abnormality was evident in the esophagus to explain the       patient's complaint of dysphagia. Estimated blood loss: none.      The exam of the esophagus was otherwise normal. Impression:            - Normal duodenal bulb, first portion of the duodenum,                         second portion of the duodenum and third portion of                         the duodenum.                        - Gastritis. Biopsied.                        - Z-line regular.                        - Esophagogastric landmarks identified.                        - Normal mucosa was found in the entire esophagus.                        - No endoscopic esophageal abnormality to explain                         patient's dysphagia.                        -  Biopsies were taken with a cold forceps for                         evaluation of eosinophilic esophagitis. Recommendation:        - Patient has a contact number available for                         emergencies. The signs and symptoms of potential                         delayed complications were discussed with the patient.                         Return to normal activities tomorrow. Written                         discharge instructions were provided to the patient.                        - Discharge patient to home.                        - Resume previous diet.                        - moisten and  chew food well.                        - Continue present medications.                        - No ibuprofen, naproxen, or other non-steroidal                         anti-inflammatory drugs.                        - Await pathology results.                        - Return to GI clinic as previously scheduled.                        - The findings and recommendations were discussed with                         the patient. Procedure Code(s):     --- Professional ---                        [id number], Esophagogastroduodenoscopy, flexible,                         transoral; with biopsy, single or multiple Diagnosis Code(s):     --- Professional ---                        K29.70, Gastritis, unspecified, without bleeding                        R13.10, Dysphagia, unspecified CPT copyright 2022 American Medical Association. All rights reserved. The codes documented in this  report are preliminary and upon coder review may  be revised to meet current compliance requirements. Attending Participation:      I personally performed the entire procedure. Polo Brisk, DO Quintin Buckle DO, DO 12/12/2023 7:51:48 AM This report has been signed electronically. Number of Addenda: 0 Note Initiated On: 12/12/2023 7:13 AM Estimated Blood Loss:  Estimated blood loss was minimal.      [facility]

## 2023-12-13 ENCOUNTER — Other Ambulatory Visit: Payer: Self-pay | Admitting: Family Medicine

## 2023-12-13 ENCOUNTER — Encounter: Payer: Self-pay | Admitting: Gastroenterology

## 2023-12-13 LAB — SURGICAL PATHOLOGY

## 2023-12-16 ENCOUNTER — Ambulatory Visit: Admitting: Dermatology

## 2023-12-16 NOTE — Telephone Encounter (Signed)
 Requested Prescriptions  Refused Prescriptions Disp Refills   OZEMPIC , 1 MG/DOSE, 4 MG/3ML SOPN [Pharmacy Med Name: OZEMPIC  1MG  PER DOSE (4MG /3ML) PFP] 9 mL 0    Sig: INJECT 1 MG UNDER THE SKIN ONCE A WEEK AS DIRECTED     Endocrinology:  Diabetes - GLP-1 Receptor Agonists - semaglutide  Failed - 12/16/2023  4:47 PM      Failed - HBA1C in normal range and within 180 days    Hemoglobin A1C  Date Value Ref Range Status  02/29/2016 6.7  Final   HB A1C (BAYER DCA - WAIVED)  Date Value Ref Range Status  11/22/2023 7.3 (H) 4.8 - 5.6 % Final    Comment:             Prediabetes: 5.7 - 6.4          Diabetes: >6.4          Glycemic control for adults with diabetes: <7.0          Failed - Valid encounter within last 6 months    Recent Outpatient Visits           3 weeks ago Primary hypertension   [facility] [place], Duwaine SQUIBB, DO       Future Appointments             In 1 week Claudene Lehmann, MD [facility]            Passed - Cr in normal range and within 360 days    Creatinine, Ser  Date Value Ref Range Status  11/22/2023 0.74 0.57 - 1.00 mg/dL Final

## 2023-12-19 ENCOUNTER — Other Ambulatory Visit: Payer: Self-pay

## 2023-12-19 ENCOUNTER — Ambulatory Visit (INDEPENDENT_AMBULATORY_CARE_PROVIDER_SITE_OTHER): Admitting: Pulmonary Disease

## 2023-12-19 ENCOUNTER — Ambulatory Visit
Admission: RE | Admit: 2023-12-19 | Discharge: 2023-12-19 | Disposition: A | Discharge status: Home / Self Care | Source: Ambulatory Visit | Attending: Surgery | Admitting: Surgery

## 2023-12-19 ENCOUNTER — Encounter: Payer: Self-pay | Admitting: Pulmonary Disease

## 2023-12-19 VITALS — BP 128/80 | HR 68 | Temp 98.1°F | Ht 62.0 in | Wt 178.6 lb

## 2023-12-19 DIAGNOSIS — J441 Chronic obstructive pulmonary disease with (acute) exacerbation: Secondary | ICD-10-CM | POA: Diagnosis not present

## 2023-12-19 DIAGNOSIS — F17210 Nicotine dependence, cigarettes, uncomplicated: Secondary | ICD-10-CM

## 2023-12-19 DIAGNOSIS — R062 Wheezing: Secondary | ICD-10-CM | POA: Diagnosis not present

## 2023-12-19 DIAGNOSIS — R0602 Shortness of breath: Secondary | ICD-10-CM

## 2023-12-19 DIAGNOSIS — M25512 Pain in left shoulder: Secondary | ICD-10-CM | POA: Diagnosis not present

## 2023-12-19 DIAGNOSIS — J9801 Acute bronchospasm: Secondary | ICD-10-CM | POA: Diagnosis not present

## 2023-12-19 DIAGNOSIS — I700 Atherosclerosis of aorta: Secondary | ICD-10-CM | POA: Diagnosis not present

## 2023-12-19 DIAGNOSIS — M19012 Primary osteoarthritis, left shoulder: Secondary | ICD-10-CM | POA: Insufficient documentation

## 2023-12-19 DIAGNOSIS — J439 Emphysema, unspecified: Secondary | ICD-10-CM

## 2023-12-19 MED ORDER — IPRATROPIUM-ALBUTEROL 0.5-2.5 (3) MG/3ML IN SOLN
3.0000 mL | Freq: Once | RESPIRATORY_TRACT | Status: AC
  Administered 2023-12-19: 3 mL via RESPIRATORY_TRACT

## 2023-12-19 NOTE — Progress Notes (Signed)
 Subjective:    Patient ID: Monica Hull, female    DOB: 09-26-1960, 63 y.o.   MRN: [id number]  Patient Care Team: Vicci Duwaine SQUIBB, DO as PCP - General (Family Medicine) Perla Evalene PARAS, MD as PCP - Cardiology (Cardiology) Tamea Dedra CROME, MD as Consulting Physician (Pulmonary Disease)  Chief Complaint  Patient presents with   Follow-up    Dry cough with occasional wheezing. Shortness of breath on exertion.     BACKGROUND/INTERVAL:This is a 63 year old female, current every day smoker currently smoking a pack a day, with 39-pack-year history of smoking. PMH significant for COPD, sleep apnea, allergic rhinitis, GERD, hypertension, carotid stenosis, diet controlled diabetes mellitus, hyperlipidemia.  I last saw the patient on 01 May 2023. She is maintained on Breztri  2 puffs twice a day.  Since her last visit she has not had any major exacerbations nor admissions to the hospital.  History of mild obstructive sleep apnea.  She is intolerant of CPAP.   HPI Discussed the use of AI scribe software for clinical note transcription with the patient, who gave verbal consent to proceed.  History of Present Illness   Monica Hull is a 63 year old female with COPD and nocturnal hypoxemia who presents for follow-up on her respiratory issues.  Her breathing has been variable, with some days being better than others. She experiences fatigue and frequently feels tired due to poor sleep hygiene. She has a history of sleep apnea but was unable to tolerate CPAP therapy.  She continues to smoke less than a pack a day, although she mentions that some days she smokes less, especially when not outdoors.  She is prescribed Breztri  inhaler but does not use it daily, often forgetting to take it due to being busy. Despite reminders from others, she sometimes forgets to use it.  No chest pain or coughing up sputum. She is scheduled for another shoulder replacement surgery in  September and recalls a previous incident where she received too much anesthesia during her last shoulder surgery.      DATA 09/26/2021 PFTs: FEV1 1.70 L or 71% predicted, FVC 2.13 L or 68% predicted, FEV1/FVC 80%, no bronchodilator response.  Lung volumes normal, diffusion capacity normal.  Patient had difficulty during the test.  No significant obstruction. 05/15/2022 sleep study, in lab: Mild obstructive sleep apnea AHI of 9.7 events per hour O2 sats 85%. 05/23/2022 LDCT chest: Mild centrilobular emphysema. Stable small bilateral pulmonary nodules largest 5.5 mm. Lung RADS 2, benign appearance or behavior. 08/21/2022 chest x-ray PA and lateral: No evidence of acute disease. 05/28/2023 LDCT chest: No pleural fluid, mild centrilobular emphysema.  No change in pulmonary nodules.  Lung RADS 2.  Review of Systems A 10 point review of systems was performed and it is as noted above otherwise negative.   Patient Active Problem List   Diagnosis Date Noted   Cyst on ear 03/27/2023   Bilateral hearing loss 03/27/2023   Aortic atherosclerosis (HCC) 04/08/2019   Syncope 03/24/2018   Allergic rhinitis 02/27/2017   Current smoker 02/20/2016   Carotid stenosis 02/16/2016   Thyroid  nodule 11/01/2015   Type 2 diabetes mellitus with other specified complication (HCC) 08/18/2015   PMB (postmenopausal bleeding) 06/30/2015   Obesity (BMI 30.0-34.9) 06/30/2015   Paresthesias 02/01/2015   DJD (degenerative joint disease), cervical 02/01/2015   Stress incontinence 12/21/2014   Asthma    GERD (gastroesophageal reflux disease)    Sleep apnea    Hyperlipidemia  Hypertension    COPD (chronic obstructive pulmonary disease) (HCC)    Nicotine  dependence, cigarettes, w unsp disorders     Social History   Tobacco Use   Smoking status: Every Day    Current packs/day: 1.00    Average packs/day: 2.0 packs/day for 50.5 years (99.5 ttl pk-yrs)    Types: Cigarettes    Start date: 10   Smokeless tobacco:  Never  Substance Use Topics   Alcohol use: Not Currently    Comment: rarely    Allergies  Allergen Reactions   Advair Hfa [Fluticasone -Salmeterol] Rash    Rash and thrush   Anoro Ellipta  [Umeclidinium-Vilanterol] Rash    Causes patients tongue to break out   Biaxin [Clarithromycin] Nausea And Vomiting and Rash   Oxycodone  Rash    Current Meds  Medication Sig   albuterol  (PROVENTIL ) (2.5 MG/3ML) 0.083% nebulizer solution Take 3 mLs (2.5 mg total) by nebulization every 6 (six) hours as needed for wheezing or shortness of breath.   albuterol  (VENTOLIN  HFA) 108 (90 Base) MCG/ACT inhaler Inhale 2 puffs into the lungs every 6 (six) hours as needed for wheezing or shortness of breath.   aspirin EC 81 MG tablet Take 81 mg by mouth daily. Swallow whole.   atorvastatin  (LIPITOR) 80 MG tablet Take 1 tablet (80 mg total) by mouth daily.   budesonide -glycopyrrolate -formoterol  (BREZTRI  AEROSPHERE) 160-9-4.8 MCG/ACT AERO inhaler Inhale 2 puffs into the lungs in the morning and at bedtime.   celecoxib (CELEBREX) 200 MG capsule Take 200 mg by mouth 2 (two) times daily as needed for moderate pain (pain score 4-6).   cetirizine  (ZYRTEC ) 10 MG tablet Take 1 tablet (10 mg total) by mouth daily.   diclofenac  (VOLTAREN ) 75 MG EC tablet Take 1 tablet (75 mg total) by mouth 2 (two) times daily.   diclofenac  Sodium (VOLTAREN ) 1 % GEL Apply 2 g topically daily as needed (knee pain).   ezetimibe  (ZETIA ) 10 MG tablet Take 1 tablet (10 mg total) by mouth daily.   fluticasone  (FLONASE ) 50 MCG/ACT nasal spray Place 2 sprays into both nostrils daily as needed for allergies.   levalbuterol  (XOPENEX  HFA) 45 MCG/ACT inhaler Inhale 2 puffs into the lungs every 6 (six) hours as needed for wheezing or shortness of breath.   linaclotide  (LINZESS ) 290 MCG CAPS capsule Take 1 capsule (290 mcg total) by mouth daily before breakfast.   LINZESS  290 MCG CAPS capsule Take 290 mcg by mouth daily.   lisinopril  (ZESTRIL ) 40 MG tablet  Take 1 tablet (40 mg total) by mouth daily.   lisinopril  (ZESTRIL ) 40 MG tablet Take 1 tablet (40 mg total) by mouth daily.   metFORMIN  (GLUCOPHAGE -XR) 500 MG 24 hr tablet Take 2 tablets (1,000 mg total) by mouth daily with breakfast.   pantoprazole  (PROTONIX ) 40 MG tablet Take 1 tablet (40 mg total) by mouth 2 (two) times daily 30 minutes before a meal.   Semaglutide , 1 MG/DOSE, 4 MG/3ML SOPN Inject 1 mg as directed once a week.   Spacer/Aero-Holding Chambers (AEROCHAMBER MV) inhaler Use as instructed   trolamine salicylate (ASPERCREME) 10 % cream Apply 1 application  topically as needed for muscle pain.    Immunization History  Administered Date(s) Administered   Influenza,inj,Quad PF,6+ Mos 03/07/2015, 02/29/2016, 05/09/2017, 03/24/2018, 03/05/2019, 04/14/2020, 04/25/2021   Influenza-Unspecified 03/25/2014, 04/07/2022, 02/24/2023   PFIZER(Purple Top)SARS-COV-2 Vaccination 09/10/2019, 10/06/2019, 04/14/2020   Pfizer(Comirnaty)Fall Seasonal Vaccine 12 years and older 06/14/2022   Pneumococcal Polysaccharide-23 09/17/2014   Respiratory Syncytial Virus Vaccine,Recomb Aduvanted(Arexvy) 04/07/2022  Tdap 12/21/2014   Unspecified SARS-COV-2 Vaccination 02/24/2023   Zoster Recombinant(Shingrix ) 02/07/2021, 09/10/2022        Objective:     BP 128/80 (BP Location: Right Arm, Patient Position: Sitting, Cuff Size: Normal)   Pulse 68   Temp 98.1 F (36.7 C) (Oral)   Ht 5' 2 (1.575 m)   Wt 178 lb 9.6 oz (81 kg)   SpO2 96%   BMI 32.67 kg/m   SpO2: 96 %  GENERAL: Overweight woman, no acute distress, fully ambulatory, smells heavily of tobacco.  No conversational dyspnea.  Looks much older than stated age, plethoric. HEAD: Normocephalic, atraumatic.  EYES: Pupils equal, round, reactive to light.  No scleral icterus.  MOUTH: Edentulous.  Oral mucosa moist.  No thrush.  NECK: Supple. No thyromegaly. Trachea midline. No JVD.  No adenopathy. PULMONARY: Diminished air sounds bilaterally.   Diffuse end expiratory wheezes and rhonchi noted. CARDIOVASCULAR: S1 and S2. Regular rate and rhythm.  No rubs, murmurs or gallops heard. ABDOMEN: Obese otherwise benign. MUSCULOSKELETAL: No joint deformity, no clubbing, no edema.  NEUROLOGIC: Neuro grossly normal. SKIN: Intact,warm,dry. PSYCH: Mood and behavior normal. :   Patient received nebulizer therapy DuoNeb x 1: Marked improvement on auscultation noted in air entry and decrease in bronchospasm noted.      Assessment & Plan:     ICD-10-CM   1. Bronchospasm  J98.01 ipratropium-albuterol  (DUONEB) 0.5-2.5 (3) MG/3ML nebulizer solution 3 mL    Pulmonary function test    2. Decompensated COPD (chronic obstructive pulmonary disease) (HCC)  J44.1 Pulmonary function test    Overnight Pulse Oximetry Study    3. Shortness of breath  R06.02     4. Tobacco dependence due to cigarettes  F17.210      Orders Placed This Encounter  Procedures   Overnight Pulse Oximetry Study    Standing Status:   Future    Expiration Date:   12/18/2024    Scheduling Instructions:     On room air.   Pulmonary function test    Standing Status:   Future    Expected Date:   01/18/2024    Expiration Date:   12/18/2024    Where should this test be performed?:   Outpatient Pulmonary    What type of PFT is being ordered?:   Full PFT    Meds ordered this encounter  Medications   ipratropium-albuterol  (DUONEB) 0.5-2.5 (3) MG/3ML nebulizer solution 3 mL   Discussion:    Decompensated chronic Obstructive Pulmonary Disease (COPD) Decompensated COPD with intermittent wheezing and dyspnea, exacerbated by smoking and non-compliance with Breztri  inhaler. - Counseled with regards to smoking cessation to improve respiratory function and reduce surgical risks. - Instruct to USE BREZTRI  inhaler daily to manage COPD symptoms. - Use as needed albuterol  - Administered nebulizer treatment to alleviate wheezing. - Order pulmonary function tests prior to upcoming  shoulder surgery.  Nocturnal Hypoxemia Nocturnal hypoxemia likely related to COPD and smoking, with previous CPAP compliance issues. - Reassess oxygen levels to evaluate current status of nocturnal hypoxemia.  Sleep Apnea Sleep apnea with CPAP intolerance and persistent fatigue, possibly related to untreated sleep apnea and smoking.  Apnea was mild with AHI of 9.7 events per hour.  She also exhibited fragmentation of sleep. - Reassess sleep apnea management options considering CPAP intolerance.      Advised if symptoms do not improve or worsen, to please contact office for sooner follow up or seek emergency care.    I spent 40 minutes of dedicated  to the care of this patient on the date of this encounter to include pre-visit review of records, face-to-face time with the patient discussing conditions above, post visit ordering of testing, clinical documentation with the electronic health record, making appropriate referrals as documented, and communicating necessary findings to members of the patients care team.     C. Leita Sanders, MD Advanced Bronchoscopy PCCM [facility]    *This note was generated using voice recognition software/Dragon and/or AI transcription program.  Despite best efforts to proofread, errors can occur which can change the meaning. Any transcriptional errors that result from this process are unintentional and may not be fully corrected at the time of dictation.

## 2023-12-19 NOTE — Patient Instructions (Signed)
 VISIT SUMMARY:  Today, you came in for a follow-up on your respiratory issues, including COPD and nocturnal hypoxemia. We discussed your breathing difficulties, fatigue, and your history of sleep apnea. You mentioned that you continue to smoke less than a pack a day and sometimes forget to use your Breztri  inhaler. We also talked about your upcoming shoulder replacement surgery in September.  YOUR PLAN:  -CHRONIC OBSTRUCTIVE PULMONARY DISEASE (COPD): COPD is a chronic lung disease that causes breathing difficulties. It is important to stop smoking to improve your breathing and reduce surgical risks. Please use your Breztri  inhaler daily to help manage your symptoms. We administered a nebulizer treatment today to help with your wheezing. We will also order pulmonary function tests before your shoulder surgery.  -NOCTURNAL HYPOXEMIA: Nocturnal hypoxemia means low oxygen levels during sleep, which can be related to COPD and smoking. We will reassess your oxygen levels to understand your current status.  -SLEEP APNEA: Sleep apnea is a condition where your breathing stops and starts during sleep. Since you have trouble using CPAP, we will look into other management options to help with your fatigue and improve your sleep quality.  INSTRUCTIONS:  Please remember to use your Breztri  inhaler daily and try to reduce or quit smoking. We will schedule pulmonary function tests before your shoulder surgery. Additionally, we will reassess your oxygen levels and explore other options for managing your sleep apnea. Follow up with us  as needed.

## 2023-12-20 ENCOUNTER — Encounter: Payer: Self-pay | Admitting: Pulmonary Disease

## 2023-12-23 ENCOUNTER — Ambulatory Visit: Admitting: Family Medicine

## 2023-12-23 ENCOUNTER — Ambulatory Visit: Payer: Medicaid Other | Admitting: Pulmonary Disease

## 2023-12-26 ENCOUNTER — Other Ambulatory Visit: Payer: Self-pay

## 2023-12-26 ENCOUNTER — Telehealth: Payer: Self-pay

## 2023-12-26 ENCOUNTER — Ambulatory Visit: Admitting: Dermatology

## 2023-12-26 ENCOUNTER — Encounter: Payer: Self-pay | Admitting: Dermatology

## 2023-12-26 DIAGNOSIS — M720 Palmar fascial fibromatosis [Dupuytren]: Secondary | ICD-10-CM

## 2023-12-26 DIAGNOSIS — L853 Xerosis cutis: Secondary | ICD-10-CM

## 2023-12-26 DIAGNOSIS — L84 Corns and callosities: Secondary | ICD-10-CM | POA: Diagnosis not present

## 2023-12-26 NOTE — Patient Instructions (Addendum)
 Gentle Skin Care Guide  1. Bathe no more than once a day.  2. Avoid bathing in hot water  3. Use a mild soap like Dove, Vanicream, Cetaphil, CeraVe. Can use Lever 2000 or Cetaphil antibacterial soap  4. Use soap only where you need it. On most days, use it under your arms, between your legs, and on your feet. Let the water rinse other areas unless visibly dirty.  5. When you get out of the bath/shower, use a towel to gently blot your skin dry, don't rub it.  6. While your skin is still a little damp, apply a moisturizing cream such as Vanicream, CeraVe, Cetaphil, Eucerin, Sarna lotion or plain Vaseline Jelly. For hands apply Neutrogena [place] Hand Cream or Excipial Hand Cream.  7. Reapply moisturizer any time you start to itch or feel dry.  8. Sometimes using free and clear laundry detergents can be helpful. Fabric softener sheets should be avoided. Downy Free & Gentle liquid, or any liquid fabric softener that is free of dyes and perfumes, it acceptable to use  9. If your doctor has given you prescription creams you may apply moisturizers over them      Due to recent changes in healthcare laws, you may see results of your pathology and/or laboratory studies on MyChart before the doctors have had a chance to review them. We understand that in some cases there may be results that are confusing or concerning to you. Please understand that not all results are received at the same time and often the doctors may need to interpret multiple results in order to provide you with the best plan of care or course of treatment. Therefore, we ask that you please give [place] 2 business days to thoroughly review all your results before contacting the office for clarification. Should we see a critical lab result, you will be contacted sooner.   If You Need Anything After Your Visit  If you have any questions or concerns for your doctor, please call our main line at [phone] and press option 4 to reach  your doctor's medical assistant. If no one answers, please leave a voicemail as directed and we will return your call as soon as possible. Messages left after 4 pm will be answered the following business day.   You may also send [place] a message via MyChart. We typically respond to MyChart messages within 1-2 business days.  For prescription refills, please ask your pharmacy to contact our office. Our fax number is [phone].  If you have an urgent issue when the clinic is closed that cannot wait until the next business day, you can page your doctor at the number below.    Please note that while we do our best to be available for urgent issues outside of office hours, we are not available 24/7.   If you have an urgent issue and are unable to reach [place], you may choose to seek medical care at your doctor's office, retail clinic, urgent care center, or emergency room.  If you have a medical emergency, please immediately call 911 or go to the emergency department.  Pager Numbers  - Dr. Gwen Pounds: [phone]  - Dr. Roseanne Reno: [phone]  - Dr. Katrinka Blazing: [phone]   In the event of inclement weather, please call our main line at [phone] for an update on the status of any delays or closures.  Dermatology Medication Tips: Please keep the boxes that topical medications come in in order to help keep track of the instructions  about where and how to use these. Pharmacies typically print the medication instructions only on the boxes and not directly on the medication tubes.   If your medication is too expensive, please contact our office at [phone] option 4 or send [place] a message through MyChart.   We are unable to tell what your co-pay for medications will be in advance as this is different depending on your insurance coverage. However, we may be able to find a substitute medication at lower cost or fill out paperwork to get insurance to cover a needed medication.   If a prior authorization  is required to get your medication covered by your insurance company, please allow [place] 1-2 business days to complete this process.  Drug prices often vary depending on where the prescription is filled and some pharmacies may offer cheaper prices.  The website www.goodrx.com contains coupons for medications through different pharmacies. The prices here do not account for what the cost may be with help from insurance (it may be cheaper with your insurance), but the website can give you the price if you did not use any insurance.  - You can print the associated coupon and take it with your prescription to the pharmacy.  - You may also stop by our office during regular business hours and pick up a GoodRx coupon card.  - If you need your prescription sent electronically to a different pharmacy, notify our office through [facility] or by phone at [phone] option 4.     Si Usted Necesita Algo Despus de Su Visita  Tambin puede enviarnos un mensaje a travs de Clinical cytogeneticist. Por lo general respondemos a los mensajes de MyChart en el transcurso de 1 a 2 das hbiles.  Para renovar recetas, por favor pida a su farmacia que se ponga en contacto con nuestra oficina. Annie Sable de fax es [place] [phone].  Si tiene un asunto urgente cuando la clnica est cerrada y que no puede esperar hasta el siguiente da hbil, puede llamar/localizar a su doctor(a) al nmero que aparece a continuacin.   Por favor, tenga en cuenta que aunque hacemos todo lo posible para estar disponibles para asuntos urgentes fuera del horario de [place], no estamos disponibles las 24 horas del da, los 7 [address]  [address] de la [place].   Si tiene un problema urgente y no puede comunicarse con nosotros, puede optar por buscar atencin mdica  en el consultorio de su doctor(a), en una clnica privada, en un centro de atencin urgente o en una sala de emergencias.  Si tiene Engineer, drilling, por favor llame inmediatamente al 911 o  vaya a la sala de emergencias.  Nmeros de bper  - Dr. Gwen Pounds: [phone]  - Dra. Roseanne Reno: [phone]  - Dr. Katrinka Blazing: [phone]   En caso de inclemencias del tiempo, por favor llame a Lacy Duverney principal al [phone] para una actualizacin sobre el [place] de cualquier retraso o cierre.  Consejos para la medicacin en dermatologa: Por favor, guarde las cajas en las que vienen los medicamentos de uso tpico para ayudarle a seguir las instrucciones sobre dnde y cmo usarlos. Las farmacias generalmente imprimen las instrucciones del medicamento slo en las cajas y no directamente en los tubos del [place].   Si su medicamento es muy caro, por favor, pngase en contacto con Rolm Gala llamando al [phone] y presione la opcin 4 o envenos un mensaje a travs de Clinical cytogeneticist.   No podemos decirle cul ser su copago por los medicamentos por adelantado ya  que esto es diferente dependiendo de la cobertura de su seguro. Sin embargo, es posible que podamos encontrar un medicamento sustituto a Audiological scientist un formulario para que el seguro cubra el medicamento que se considera necesario.   Si se requiere una autorizacin previa para que su compaa de seguros [place] su medicamento, por favor permtanos de 1 a 2 das hbiles para completar [address]  Los precios de los medicamentos varan con frecuencia dependiendo del Environmental consultant de dnde se surte la receta y alguna farmacias pueden ofrecer precios ms baratos.  El sitio web www.goodrx.com tiene cupones para medicamentos de Health and safety inspector. Los precios aqu no tienen en cuenta lo que podra costar con la ayuda del seguro (puede ser ms barato con su seguro), pero el sitio web puede darle el precio si no utiliz Tourist information centre manager.  - Puede imprimir el cupn correspondiente y llevarlo con su receta a la farmacia.  - Tambin puede pasar por nuestra oficina durante el horario de atencin regular y Education officer, museum una tarjeta de cupones  de GoodRx.  - Si necesita que su receta se enve electrnicamente a una farmacia diferente, informe a nuestra oficina a travs de MyChart de [place] o por telfono llamando al [phone] y presione la opcin 4.

## 2023-12-26 NOTE — Telephone Encounter (Signed)
 Referral faxed to [facility] for multiple corns on the feet.

## 2023-12-26 NOTE — Progress Notes (Signed)
   New Patient Visit   Subjective  Monica Hull is a 64 y.o. female who presents for the following: Multiple warts on the B/L plantar foot for years, pt had treated with LN2 many years ago, and tried OTC wart remover and callus pads, but nothing helped. Irregular thickness of skin on the R lat ankle, started as a blister a couple of years ago, but once healed area stayed thick.    The following portions of the chart were reviewed this encounter and updated as appropriate: medications, allergies, medical history  Review of Systems:  No other skin or systemic complaints except as noted in HPI or Assessment and Plan.  Objective  Well appearing patient in no apparent distress; mood and affect are within normal limits.  A focused examination was performed of the following areas: the feet and hands   Relevant exam findings are noted in the Assessment and Plan.  B/L plantar surface and R lat ankle Glassy hyperkeratotic papules and plaques on pressure points of bilateral plantar feet and R lateral malleolus  Assessment & Plan     CORNS AND CALLUS B/L plantar surface and R lat ankle Not consistent with verruca, more consistent with corns. Recommend referral to podiatrists.   Patient allergic to fluticasone , defer topical steroid at this time. Recommend OTC Amlactin moisturizers or CeraVe SA cream. Samples given today. DUPUYTREN CONTRACTURE R Palms Recommend referral to hand surgeon, patient already sees a hand surgeon and she will follow up with their office.  XEROSIS CUTIS    Xerosis - diffuse xerotic patches - recommend gentle, hydrating skin care - gentle skin care handout given. Gave moisturizer samples  Return if symptoms worsen or fail to improve, for wart follow up.  Monica Hull, CMA, am acting as scribe for Boneta Sharps, MD .   Documentation: I have reviewed the above documentation for accuracy and completeness, and I agree with the  above.  Boneta Sharps, MD

## 2024-01-04 DIAGNOSIS — Z419 Encounter for procedure for purposes other than remedying health state, unspecified: Secondary | ICD-10-CM | POA: Diagnosis not present

## 2024-01-20 DIAGNOSIS — M25462 Effusion, left knee: Secondary | ICD-10-CM | POA: Diagnosis not present

## 2024-01-20 DIAGNOSIS — M25461 Effusion, right knee: Secondary | ICD-10-CM | POA: Diagnosis not present

## 2024-01-20 DIAGNOSIS — M170 Bilateral primary osteoarthritis of knee: Secondary | ICD-10-CM | POA: Diagnosis not present

## 2024-01-20 DIAGNOSIS — G8929 Other chronic pain: Secondary | ICD-10-CM | POA: Diagnosis not present

## 2024-01-22 ENCOUNTER — Other Ambulatory Visit: Payer: Self-pay

## 2024-01-22 DIAGNOSIS — D2372 Other benign neoplasm of skin of left lower limb, including hip: Secondary | ICD-10-CM | POA: Diagnosis not present

## 2024-01-22 DIAGNOSIS — M79672 Pain in left foot: Secondary | ICD-10-CM | POA: Diagnosis not present

## 2024-01-22 DIAGNOSIS — D2371 Other benign neoplasm of skin of right lower limb, including hip: Secondary | ICD-10-CM | POA: Diagnosis not present

## 2024-01-22 DIAGNOSIS — M79671 Pain in right foot: Secondary | ICD-10-CM | POA: Diagnosis not present

## 2024-01-22 DIAGNOSIS — E119 Type 2 diabetes mellitus without complications: Secondary | ICD-10-CM | POA: Diagnosis not present

## 2024-01-22 DIAGNOSIS — B070 Plantar wart: Secondary | ICD-10-CM | POA: Diagnosis not present

## 2024-01-22 MED ORDER — CIMETIDINE 400 MG PO TABS
400.0000 mg | ORAL_TABLET | Freq: Two times a day (BID) | ORAL | 0 refills | Status: DC
  Filled 2024-01-22 – 2024-01-24 (×3): qty 60, 30d supply, fill #0

## 2024-01-23 ENCOUNTER — Other Ambulatory Visit: Payer: Self-pay

## 2024-01-24 ENCOUNTER — Other Ambulatory Visit: Payer: Self-pay

## 2024-01-28 ENCOUNTER — Other Ambulatory Visit: Payer: Self-pay

## 2024-02-04 DIAGNOSIS — Z419 Encounter for procedure for purposes other than remedying health state, unspecified: Secondary | ICD-10-CM | POA: Diagnosis not present

## 2024-02-05 DIAGNOSIS — D2372 Other benign neoplasm of skin of left lower limb, including hip: Secondary | ICD-10-CM | POA: Diagnosis not present

## 2024-02-05 DIAGNOSIS — E119 Type 2 diabetes mellitus without complications: Secondary | ICD-10-CM | POA: Diagnosis not present

## 2024-02-05 DIAGNOSIS — L84 Corns and callosities: Secondary | ICD-10-CM | POA: Diagnosis not present

## 2024-02-05 DIAGNOSIS — B070 Plantar wart: Secondary | ICD-10-CM | POA: Diagnosis not present

## 2024-02-05 DIAGNOSIS — M79672 Pain in left foot: Secondary | ICD-10-CM | POA: Diagnosis not present

## 2024-02-05 DIAGNOSIS — M79671 Pain in right foot: Secondary | ICD-10-CM | POA: Diagnosis not present

## 2024-02-05 DIAGNOSIS — D2371 Other benign neoplasm of skin of right lower limb, including hip: Secondary | ICD-10-CM | POA: Diagnosis not present

## 2024-02-10 ENCOUNTER — Other Ambulatory Visit: Payer: Self-pay

## 2024-02-10 ENCOUNTER — Ambulatory Visit: Admitting: Family Medicine

## 2024-02-10 ENCOUNTER — Encounter: Payer: Self-pay | Admitting: Family Medicine

## 2024-02-10 ENCOUNTER — Ambulatory Visit: Payer: Self-pay | Admitting: *Deleted

## 2024-02-10 VITALS — BP 137/85 | HR 64 | Temp 97.8°F | Ht 62.0 in | Wt 176.2 lb

## 2024-02-10 DIAGNOSIS — E1169 Type 2 diabetes mellitus with other specified complication: Secondary | ICD-10-CM | POA: Diagnosis not present

## 2024-02-10 DIAGNOSIS — J0100 Acute maxillary sinusitis, unspecified: Secondary | ICD-10-CM

## 2024-02-10 DIAGNOSIS — J418 Mixed simple and mucopurulent chronic bronchitis: Secondary | ICD-10-CM

## 2024-02-10 LAB — BAYER DCA HB A1C WAIVED: HB A1C (BAYER DCA - WAIVED): 6.2 % — ABNORMAL HIGH (ref 4.8–5.6)

## 2024-02-10 MED ORDER — SEMAGLUTIDE (1 MG/DOSE) 4 MG/3ML ~~LOC~~ SOPN
1.0000 mg | PEN_INJECTOR | SUBCUTANEOUS | 0 refills | Status: DC
  Filled 2024-02-10 – 2024-02-11 (×2): qty 9, 84d supply, fill #0

## 2024-02-10 MED ORDER — DOXYCYCLINE HYCLATE 100 MG PO TABS: 100.0000 mg | ORAL_TABLET | Freq: Two times a day (BID) | ORAL | 0 refills | Status: DC

## 2024-02-10 MED ORDER — PREDNISONE 10 MG PO TABS
ORAL_TABLET | ORAL | 0 refills | Status: DC
Start: 2024-02-10 — End: 2024-05-12

## 2024-02-10 NOTE — Telephone Encounter (Signed)
 Visit completed with provider this morning.

## 2024-02-10 NOTE — Assessment & Plan Note (Signed)
Stable with A1c of 6.2. Continue current regimen. Continue to monitor. Call with any concerns.

## 2024-02-10 NOTE — Telephone Encounter (Signed)
 FYI Only or Action Required?: FYI only for provider.  Patient was last seen in primary care on 11/22/2023 by Vicci Bouchard P, DO.  Called Nurse Triage reporting Cough.  Symptoms began yesterday.  Interventions attempted: Nothing.  Symptoms are: gradually worsening.  Triage Disposition: See Physician Within 24 Hours  Patient/caregiver understands and will follow disposition?: Yes   Reason for Disposition  [1] Continuous (nonstop) coughing interferes with work or school AND [2] no improvement using cough treatment per Care Advice  Answer Assessment - Initial Assessment Questions 1. ONSET: When did the cough begin?      Yesterday- got worse 2. SEVERITY: How bad is the cough today?      Wakes patient at night, increased coughing 3. SPUTUM: Describe the color of your sputum (e.g., none, dry cough; clear, white, yellow, green)     no 4. HEMOPTYSIS: Are you coughing up any blood? If Yes, ask: How much? (e.g., flecks, streaks, tablespoons, etc.)     no 5. DIFFICULTY BREATHING: Are you having difficulty breathing? If Yes, ask: How bad is it? (e.g., mild, moderate, severe)      no 6. FEVER: Do you have a fever? If Yes, ask: What is your temperature, how was it measured, and when did it start?     no 7. CARDIAC HISTORY: Do you have any history of heart disease? (e.g., heart attack, congestive heart failure)      Heart disease 8. LUNG HISTORY: Do you have any history of lung disease?  (e.g., pulmonary embolus, asthma, emphysema)     COPD 9. PE RISK FACTORS: Do you have a history of blood clots? (or: recent major surgery, recent prolonged travel, bedridden)     Not asked 10. OTHER SYMPTOMS: Do you have any other symptoms? (e.g., runny nose, wheezing, chest pain)       Nasal congestion- running  12. TRAVEL: Have you traveled out of the country in the last month? (e.g., travel history, exposures)       Grandchildren sick  Protocols used: Cough - Acute  Non-Productive-A-AH   Copied from CRM #[id number]. Topic: Clinical - Red Word Triage >> Feb 10, 2024  8:02 AM Montie POUR wrote: Red Word that prompted transfer to Nurse Triage:  [name] is calling to make appointment for [facility]. Her cough has gotten worse; sinus pressure, congestion.

## 2024-02-10 NOTE — Progress Notes (Signed)
 BP 137/85   Pulse 64   Temp 97.8 F (36.6 C) (Oral)   Ht 5' 2 (1.575 m)   Wt 176 lb 3.2 oz (79.9 kg)   SpO2 96%   BMI 32.23 kg/m    Subjective:    Patient ID: Monica Hull, female    DOB: 1961-02-10, 63 y.o.   MRN: [id number]  HPI: Monica Hull is a 63 y.o. female  Chief Complaint  Patient presents with   Cough    Onset few days ago    Nasal Congestion   UPPER RESPIRATORY TRACT INFECTION Duration: about a week Worst symptom: facial congestion Fever: no Cough: yes Shortness of breath: no Wheezing: no Chest pain: no Chest tightness: no Chest congestion: no Nasal congestion: yes Runny nose: yes Post nasal drip: yes Sneezing: yes Sore throat: no Swollen glands: no Sinus pressure: yes Headache: yes Face pain: yes Toothache: no Ear pain: no  Ear pressure: no  Eyes red/itching:no Eye drainage/crusting: no  Vomiting: no Rash: no Fatigue: yes Sick contacts: no Strep contacts: no  Context: worse Recurrent sinusitis: no Relief with OTC cold/cough medications: no  Treatments attempted: none   DIABETES Hypoglycemic episodes:no Polydipsia/polyuria: no Visual disturbance: no Chest pain: no Paresthesias: no Glucose Monitoring: no Taking Insulin?: no Blood Pressure Monitoring: not checking Retinal Examination: Up to Date Foot Exam: Up to Date Diabetic Education: Completed Pneumovax: Not up to Date Influenza: Not up to Date Aspirin: yes  Relevant past medical, surgical, family and social history reviewed and updated as indicated. Interim medical history since our last visit reviewed. Allergies and medications reviewed and updated.  Review of Systems  Constitutional: Negative.   HENT:  Positive for congestion, postnasal drip, rhinorrhea, sinus pressure and sinus pain. Negative for dental problem, drooling, ear discharge, ear pain, facial swelling, hearing loss, mouth sores, nosebleeds, sneezing, sore throat, tinnitus, trouble  swallowing and voice change.   Respiratory:  Positive for cough and shortness of breath. Negative for apnea, choking, chest tightness, wheezing and stridor.   Cardiovascular: Negative.   Musculoskeletal: Negative.   Skin: Negative.   Psychiatric/Behavioral: Negative.      Per HPI unless specifically indicated above     Objective:    BP 137/85   Pulse 64   Temp 97.8 F (36.6 C) (Oral)   Ht 5' 2 (1.575 m)   Wt 176 lb 3.2 oz (79.9 kg)   SpO2 96%   BMI 32.23 kg/m   Wt Readings from Last 3 Encounters:  02/10/24 176 lb 3.2 oz (79.9 kg)  12/19/23 178 lb 9.6 oz (81 kg)  12/12/23 176 lb (79.8 kg)    Physical Exam Vitals and nursing note reviewed.  Constitutional:      General: She is not in acute distress.    Appearance: Normal appearance. She is not ill-appearing, toxic-appearing or diaphoretic.  HENT:     Head: Normocephalic and atraumatic.     Right Ear: Tympanic membrane, ear canal and external ear normal.     Left Ear: Tympanic membrane, ear canal and external ear normal.     Nose: Congestion present. No rhinorrhea.     Mouth/Throat:     Mouth: Mucous membranes are moist.     Pharynx: Oropharynx is clear.  Eyes:     General: No scleral icterus.       Right eye: No discharge.        Left eye: No discharge.     Extraocular Movements: Extraocular movements intact.  Conjunctiva/sclera: Conjunctivae normal.     Pupils: Pupils are equal, round, and reactive to light.  Cardiovascular:     Rate and Rhythm: Normal rate and regular rhythm.     Pulses: Normal pulses.     Heart sounds: Normal heart sounds. No murmur heard.    No friction rub. No gallop.  Pulmonary:     Effort: Pulmonary effort is normal. No respiratory distress.     Breath sounds: Normal breath sounds. No stridor. No wheezing, rhonchi or rales.  Chest:     Chest wall: No tenderness.  Musculoskeletal:        General: Normal range of motion.     Cervical back: Normal range of motion and neck supple.   Skin:    General: Skin is warm and dry.     Capillary Refill: Capillary refill takes less than 2 seconds.     Coloration: Skin is not jaundiced or pale.     Findings: No bruising, erythema, lesion or rash.  Neurological:     General: No focal deficit present.     Mental Status: She is alert and oriented to person, place, and time. Mental status is at baseline.  Psychiatric:        Mood and Affect: Mood normal.        Behavior: Behavior normal.        Thought Content: Thought content normal.        Judgment: Judgment normal.     Results for orders placed or performed in visit on 02/10/24  Bayer DCA Hb A1c Waived   Collection Time: 02/10/24 10:37 AM  Result Value Ref Range   HB A1C (BAYER DCA - WAIVED) 6.2 (H) 4.8 - 5.6 %      Assessment & Plan:   Problem List Items Addressed This Visit       Endocrine   Type 2 diabetes mellitus with other specified complication (HCC)   Stable with A1c of 6.2. Continue current regimen. Continue to monitor. Call with any concerns.       Relevant Medications   Semaglutide , 1 MG/DOSE, 4 MG/3ML SOPN   Other Relevant Orders   Bayer DCA Hb A1c Waived (Completed)   Other Visit Diagnoses       Acute non-recurrent maxillary sinusitis    -  Primary   Will treat with prednisone  taper and doxycycline . Call with any concerns or if not getting better.   Relevant Medications   predniSONE  (DELTASONE ) 10 MG tablet   doxycycline  (VIBRA -TABS) 100 MG tablet        Follow up plan: Return in about 3 months (around 05/12/2024) for OK to cancel appt on 9/4.

## 2024-02-11 ENCOUNTER — Other Ambulatory Visit: Payer: Self-pay

## 2024-02-14 ENCOUNTER — Other Ambulatory Visit: Payer: Self-pay

## 2024-02-17 DIAGNOSIS — J449 Chronic obstructive pulmonary disease, unspecified: Secondary | ICD-10-CM | POA: Diagnosis not present

## 2024-02-21 ENCOUNTER — Encounter

## 2024-02-21 ENCOUNTER — Ambulatory Visit: Admitting: Pulmonary Disease

## 2024-02-27 ENCOUNTER — Ambulatory Visit: Admitting: Family Medicine

## 2024-03-02 DIAGNOSIS — H6983 Other specified disorders of Eustachian tube, bilateral: Secondary | ICD-10-CM | POA: Diagnosis not present

## 2024-03-02 DIAGNOSIS — H903 Sensorineural hearing loss, bilateral: Secondary | ICD-10-CM | POA: Diagnosis not present

## 2024-03-06 DIAGNOSIS — Z419 Encounter for procedure for purposes other than remedying health state, unspecified: Secondary | ICD-10-CM | POA: Diagnosis not present

## 2024-04-09 ENCOUNTER — Other Ambulatory Visit: Payer: Self-pay

## 2024-04-27 ENCOUNTER — Other Ambulatory Visit: Payer: Self-pay

## 2024-04-27 ENCOUNTER — Ambulatory Visit (INDEPENDENT_AMBULATORY_CARE_PROVIDER_SITE_OTHER)

## 2024-04-27 DIAGNOSIS — Z23 Encounter for immunization: Secondary | ICD-10-CM

## 2024-04-27 NOTE — Progress Notes (Signed)
 Patient is in office today for a nurse visit for both a Flu Immunization and Covid Immunization. Flu immunization given to the left deltoid and Covid immunization given in the right deltoid. Patient tolerated well with no concerns.

## 2024-05-06 DIAGNOSIS — Z419 Encounter for procedure for purposes other than remedying health state, unspecified: Secondary | ICD-10-CM | POA: Diagnosis not present

## 2024-05-11 ENCOUNTER — Other Ambulatory Visit: Payer: Self-pay

## 2024-05-12 ENCOUNTER — Encounter: Payer: Self-pay | Admitting: Family Medicine

## 2024-05-12 ENCOUNTER — Other Ambulatory Visit: Payer: Self-pay

## 2024-05-12 ENCOUNTER — Ambulatory Visit: Admitting: Family Medicine

## 2024-05-12 VITALS — BP 136/78 | HR 64 | Temp 97.4°F | Ht 62.0 in | Wt 172.8 lb

## 2024-05-12 DIAGNOSIS — Z23 Encounter for immunization: Secondary | ICD-10-CM

## 2024-05-12 DIAGNOSIS — E66811 Obesity, class 1: Secondary | ICD-10-CM

## 2024-05-12 DIAGNOSIS — E782 Mixed hyperlipidemia: Secondary | ICD-10-CM

## 2024-05-12 DIAGNOSIS — E1169 Type 2 diabetes mellitus with other specified complication: Secondary | ICD-10-CM | POA: Diagnosis not present

## 2024-05-12 DIAGNOSIS — I10 Essential (primary) hypertension: Secondary | ICD-10-CM

## 2024-05-12 DIAGNOSIS — Z1231 Encounter for screening mammogram for malignant neoplasm of breast: Secondary | ICD-10-CM

## 2024-05-12 DIAGNOSIS — J418 Mixed simple and mucopurulent chronic bronchitis: Secondary | ICD-10-CM

## 2024-05-12 DIAGNOSIS — D692 Other nonthrombocytopenic purpura: Secondary | ICD-10-CM | POA: Insufficient documentation

## 2024-05-12 DIAGNOSIS — Z0000 Encounter for general adult medical examination without abnormal findings: Secondary | ICD-10-CM | POA: Diagnosis not present

## 2024-05-12 DIAGNOSIS — I700 Atherosclerosis of aorta: Secondary | ICD-10-CM

## 2024-05-12 LAB — MICROALBUMIN, URINE WAIVED
Creatinine, Urine Waived: 200 mg/dL (ref 10–300)
Microalb, Ur Waived: 80 mg/L — ABNORMAL HIGH (ref 0–19)

## 2024-05-12 LAB — BAYER DCA HB A1C WAIVED: HB A1C (BAYER DCA - WAIVED): 5.8 % — ABNORMAL HIGH (ref 4.8–5.6)

## 2024-05-12 MED ORDER — EZETIMIBE 10 MG PO TABS
10.0000 mg | ORAL_TABLET | Freq: Every day | ORAL | 2 refills | Status: AC
  Filled 2024-05-12: qty 90, 90d supply, fill #0

## 2024-05-12 MED ORDER — ALBUTEROL SULFATE (2.5 MG/3ML) 0.083% IN NEBU
2.5000 mg | INHALATION_SOLUTION | Freq: Four times a day (QID) | RESPIRATORY_TRACT | 1 refills | Status: AC | PRN
  Filled 2024-05-12: qty 150, 13d supply, fill #0

## 2024-05-12 MED ORDER — PANTOPRAZOLE SODIUM 40 MG PO TBEC
40.0000 mg | DELAYED_RELEASE_TABLET | Freq: Every morning | ORAL | 1 refills | Status: AC
  Filled 2024-05-12: qty 90, 90d supply, fill #0

## 2024-05-12 MED ORDER — SEMAGLUTIDE (1 MG/DOSE) 4 MG/3ML ~~LOC~~ SOPN
1.0000 mg | PEN_INJECTOR | SUBCUTANEOUS | 1 refills | Status: AC
  Filled 2024-05-12: qty 9, 84d supply, fill #0

## 2024-05-12 MED ORDER — ALBUTEROL SULFATE HFA 108 (90 BASE) MCG/ACT IN AERS
2.0000 | INHALATION_SPRAY | Freq: Four times a day (QID) | RESPIRATORY_TRACT | 6 refills | Status: AC | PRN
  Filled 2024-05-12: qty 18, 30d supply, fill #0

## 2024-05-12 MED ORDER — ATORVASTATIN CALCIUM 80 MG PO TABS
80.0000 mg | ORAL_TABLET | Freq: Every day | ORAL | 1 refills | Status: AC
Start: 2024-05-12 — End: ?
  Filled 2024-05-12: qty 90, 90d supply, fill #0

## 2024-05-12 MED ORDER — LISINOPRIL 40 MG PO TABS
40.0000 mg | ORAL_TABLET | Freq: Every day | ORAL | 1 refills | Status: AC
  Filled 2024-05-12: qty 90, 90d supply, fill #0

## 2024-05-12 NOTE — Assessment & Plan Note (Signed)
 Under good control on current regimen. Continue current regimen. Continue to monitor. Call with any concerns. Refills given. Labs drawn today.

## 2024-05-12 NOTE — Progress Notes (Signed)
 BP 136/78   Pulse 64   Temp (!) 97.4 F (36.3 C) (Oral)   Ht 5' 2 (1.575 m)   Wt 172 lb 12.8 oz (78.4 kg)   SpO2 96%   BMI 31.61 kg/m    Subjective:    Patient ID: Monica Hull, female    DOB: [birth year], 63 y.o.   MRN: [id number]  HPI: Monica Hull is a 63 y.o. female presenting on 05/12/2024 for comprehensive medical examination. Current medical complaints include:  DIABETES Hypoglycemic episodes:no Polydipsia/polyuria: no Visual disturbance: no Chest pain: no Paresthesias: no Glucose Monitoring: no  Accucheck frequency: Not Checking Taking Insulin?: no Blood Pressure Monitoring: not checking Retinal Examination: Not up to Date Foot Exam: Up to Date Diabetic Education: Completed Pneumovax: Up to Date Influenza: Up to Date Aspirin: yes  HYPERTENSION / HYPERLIPIDEMIA Satisfied with current treatment? yes Duration of hypertension: chronic BP monitoring frequency: not checking BP medication side effects: no Past BP meds: lisinopril  Duration of hyperlipidemia: chronic Cholesterol medication side effects: no Cholesterol supplements: none Past cholesterol medications: zetia , atorvastatin  Medication compliance: excellent compliance Aspirin: yes Recent stressors: no Recurrent headaches: no Visual changes: no Palpitations: no Dyspnea: no Chest pain: no Lower extremity edema: no Dizzy/lightheaded: no  COPD COPD status: controlled Satisfied with current treatment?: yes Oxygen use: no Dyspnea frequency: daily Cough frequency: daily Rescue inhaler frequency: daily   Limitation of activity: yes Productive cough: yes Pneumovax: Up to Date Influenza: Up to Date   She currently lives with: husband and kids and grandkids Menopausal Symptoms: no  Depression Screen done today and results listed below:     05/12/2024    9:15 AM 02/10/2024   10:21 AM 03/27/2023    1:31 PM 01/29/2023    9:03 AM 11/06/2022   11:53 AM  Depression screen PHQ  2/9  Decreased Interest 0 0 0 1 0  Down, Depressed, Hopeless 0 0 0 0 0  PHQ - 2 Score 0 0 0 1 0  Altered sleeping 2 2 0 3 1  Tired, decreased energy 3 2 0 3 1  Change in appetite 0 0 0 0 0  Feeling bad or failure about yourself  0 0 0 0 0  Trouble concentrating 0 0 0 0 0  Moving slowly or fidgety/restless 0 0 0 0 0  Suicidal thoughts 0 0 0 0 0  PHQ-9 Score 5 4  0  7  2   Difficult doing work/chores Not difficult at all   Not difficult at all Not difficult at all     Data saved with a previous flowsheet row definition     Past Medical History:  Past Medical History:  Diagnosis Date   Aortic atherosclerosis    Arthritis    Asthma    Bilateral carotid artery disease    CAD (coronary artery disease)    Carpal tunnel syndrome    Chronic kidney disease    Cirrhosis (HCC)    COPD (chronic obstructive pulmonary disease) (HCC)    DDD (degenerative disc disease), cervical    Elevated liver function tests    GERD (gastroesophageal reflux disease)    Hepatic steatosis    History of kidney stones    Hyperlipidemia    Hypertension    Impaired fasting glucose    Long-term use of aspirin therapy    Migraines    OSA (obstructive sleep apnea)    a.) no nocturnal PAP therapy   Pneumonia    Tobacco abuse    Type  2 diabetes with complication [facility])     Surgical History:  Past Surgical History:  Procedure Laterality Date   CARPAL TUNNEL RELEASE Left    CARPAL TUNNEL RELEASE Right 10/22/2023   Procedure: RELEASE, CARPAL TUNNEL, ENDOSCOPIC;  Surgeon: Edie Norleen PARAS, MD;  Location: [facility] ORS;  Service: Orthopedics;  Laterality: Right;   CESAREAN SECTION     X2   COLONOSCOPY N/A 12/12/2023   Procedure: COLONOSCOPY;  Surgeon: Onita Elspeth Sharper, DO;  Location: [facility] ENDOSCOPY;  Service: Gastroenterology;  Laterality: N/A;   COLONOSCOPY WITH PROPOFOL  N/A 10/10/2023   Procedure: COLONOSCOPY WITH PROPOFOL ;  Surgeon: Onita Elspeth Sharper, DO;  Location: [facility] ENDOSCOPY;  Service:  Gastroenterology;  Laterality: N/A;  DM   DILATION AND CURETTAGE OF UTERUS     ESOPHAGOGASTRODUODENOSCOPY N/A 12/12/2023   Procedure: EGD (ESOPHAGOGASTRODUODENOSCOPY);  Surgeon: Onita Elspeth Sharper, DO;  Location: [facility] ENDOSCOPY;  Service: Gastroenterology;  Laterality: N/A;   ESOPHAGOGASTRODUODENOSCOPY (EGD) WITH PROPOFOL  N/A 10/10/2023   Procedure: ESOPHAGOGASTRODUODENOSCOPY (EGD) WITH PROPOFOL ;  Surgeon: Onita Elspeth Sharper, DO;  Location: [facility] ENDOSCOPY;  Service: Gastroenterology;  Laterality: N/A;   HYSTEROSCOPY WITH D & C N/A 07/11/2015   Procedure: DILATATION AND CURETTAGE /HYSTEROSCOPY;  Surgeon: Archie Savers, MD;  Location: [facility] ORS;  Service: Gynecology;  Laterality: N/A;   PALATE / UVULA BIOPSY / EXCISION     POLYPECTOMY  12/12/2023   Procedure: POLYPECTOMY, INTESTINE;  Surgeon: Onita Elspeth Sharper, DO;  Location: [facility] ENDOSCOPY;  Service: Gastroenterology;;   REVERSE SHOULDER ARTHROPLASTY Right 10/12/2021   Procedure: REVERSE TOTAL SHOULDER ARTHROPLASTY WITH BICEPS TENODESIS.;  Surgeon: Edie Norleen PARAS, MD;  Location: [facility] ORS;  Service: Orthopedics;  Laterality: Right;   TONSILLECTOMY     childhood    Medications:  Current Outpatient Medications on File Prior to Visit  Medication Sig   aspirin EC 81 MG tablet Take 81 mg by mouth daily. Swallow whole.   budesonide -glycopyrrolate -formoterol  (BREZTRI  AEROSPHERE) 160-9-4.8 MCG/ACT AERO inhaler Inhale 2 puffs into the lungs in the morning and at bedtime.   celecoxib (CELEBREX) 200 MG capsule Take 200 mg by mouth 2 (two) times daily as needed for moderate pain (pain score 4-6).   cetirizine  (ZYRTEC ) 10 MG tablet Take 1 tablet (10 mg total) by mouth daily.   diclofenac  (VOLTAREN ) 75 MG EC tablet Take 1 tablet (75 mg total) by mouth 2 (two) times daily.   diclofenac  Sodium (VOLTAREN ) 1 % GEL Apply 2 g topically daily as needed (knee pain).   fluticasone  (FLONASE ) 50 MCG/ACT nasal spray Place 2 sprays into both nostrils daily as  needed for allergies.   levalbuterol  (XOPENEX  HFA) 45 MCG/ACT inhaler Inhale 2 puffs into the lungs every 6 (six) hours as needed for wheezing or shortness of breath.   linaclotide  (LINZESS ) 290 MCG CAPS capsule Take 1 capsule (290 mcg total) by mouth daily before breakfast.   ondansetron  (ZOFRAN ) 4 MG tablet Take 4 mg by mouth every 8 (eight) hours as needed.   Spacer/Aero-Holding Chambers (AEROCHAMBER MV) inhaler Use as instructed   trolamine salicylate (ASPERCREME) 10 % cream Apply 1 application  topically as needed for muscle pain.   No current facility-administered medications on file prior to visit.    Allergies:  Allergies  Allergen Reactions   Advair Hfa [Fluticasone -Salmeterol] Rash    Rash and thrush   Anoro Ellipta  [Umeclidinium-Vilanterol] Rash    Causes patients tongue to break out   Biaxin [Clarithromycin] Nausea And Vomiting and Rash   Oxycodone  Rash    Social History:  Social  History   Socioeconomic History   Marital status: Married    Spouse name: [name]   Number of children: 3   Years of education: Not on file   Highest education level: Not on file  Occupational History   Not on file  Tobacco Use   Smoking status: Every Day    Current packs/day: 1.00    Average packs/day: 2.0 packs/day for 50.9 years (99.9 ttl pk-yrs)    Types: Cigarettes    Start date: 47   Smokeless tobacco: Never  Vaping Use   Vaping status: Never Used  Substance and Sexual Activity   Alcohol use: Not Currently    Comment: rarely   Drug use: No   Sexual activity: Not Currently    Partners: Male  Other Topics Concern   Not on file  Social History Narrative   Not on file   Social Drivers of Health   Financial Resource Strain: Not on file  Food Insecurity: Not on file  Transportation Needs: Not on file  Physical Activity: Not on file  Stress: Not on file  Social Connections: Not on file  Intimate Partner Violence: Not on file   Social History   Tobacco Use  Smoking  Status Every Day   Current packs/day: 1.00   Average packs/day: 2.0 packs/day for 50.9 years (99.9 ttl pk-yrs)   Types: Cigarettes   Start date: 18  Smokeless Tobacco Never   Social History   Substance and Sexual Activity  Alcohol Use Not Currently   Comment: rarely    Family History:  Family History  Problem Relation Age of Onset   Arthritis Mother    Asthma Mother    Diabetes Mother    Heart disease Mother    Hyperlipidemia Mother    Hypertension Mother    Kidney disease Mother    Lung disease Mother    Pancreatic cancer Mother    Alcohol abuse Father    Hypertension Father    Hyperlipidemia Father    Diabetes Brother    Breast cancer Maternal Aunt 39   Breast cancer Maternal Aunt 48    Past medical history, surgical history, medications, allergies, family history and social history reviewed with patient today and changes made to appropriate areas of the chart.   Review of Systems  Constitutional: Negative.   HENT: Negative.         + runny nose  Eyes: Negative.   Respiratory:  Positive for cough, shortness of breath and wheezing. Negative for hemoptysis and sputum production.   Cardiovascular: Negative.   Gastrointestinal:  Positive for constipation, diarrhea and heartburn. Negative for abdominal pain, blood in stool, melena, nausea and vomiting.  Genitourinary: Negative.   Musculoskeletal:  Positive for myalgias. Negative for back pain, falls, joint pain and neck pain.  Skin: Negative.   Neurological: Negative.   Endo/Heme/Allergies:  Negative for environmental allergies and polydipsia. Bruises/bleeds easily.  Psychiatric/Behavioral: Negative.     All other ROS negative except what is listed above and in the HPI.      Objective:    BP 136/78   Pulse 64   Temp (!) 97.4 F (36.3 C) (Oral)   Ht 5' 2 (1.575 m)   Wt 172 lb 12.8 oz (78.4 kg)   SpO2 96%   BMI 31.61 kg/m   Wt Readings from Last 3 Encounters:  05/12/24 172 lb 12.8 oz (78.4 kg)   02/10/24 176 lb 3.2 oz (79.9 kg)  12/19/23 178 lb 9.6 oz (81 kg)  Physical Exam Vitals and nursing note reviewed.  Constitutional:      General: She is not in acute distress.    Appearance: Normal appearance. She is obese. She is not ill-appearing, toxic-appearing or diaphoretic.  HENT:     Head: Normocephalic and atraumatic.     Right Ear: Tympanic membrane, ear canal and external ear normal. There is no impacted cerumen.     Left Ear: Tympanic membrane, ear canal and external ear normal. There is no impacted cerumen.     Nose: Nose normal. No congestion or rhinorrhea.     Mouth/Throat:     Mouth: Mucous membranes are moist.     Pharynx: Oropharynx is clear. No oropharyngeal exudate or posterior oropharyngeal erythema.  Eyes:     General: No scleral icterus.       Right eye: No discharge.        Left eye: No discharge.     Extraocular Movements: Extraocular movements intact.     Conjunctiva/sclera: Conjunctivae normal.     Pupils: Pupils are equal, round, and reactive to light.  Neck:     Vascular: No carotid bruit.  Cardiovascular:     Rate and Rhythm: Normal rate and regular rhythm.     Pulses: Normal pulses.     Heart sounds: No murmur heard.    No friction rub. No gallop.  Pulmonary:     Effort: Pulmonary effort is normal. No respiratory distress.     Breath sounds: Normal breath sounds. No stridor. No wheezing, rhonchi or rales.  Chest:     Chest wall: No tenderness.  Abdominal:     General: Abdomen is flat. Bowel sounds are normal. There is no distension.     Palpations: Abdomen is soft. There is no mass.     Tenderness: There is no abdominal tenderness. There is no right CVA tenderness, left CVA tenderness, guarding or rebound.     Hernia: No hernia is present.  Genitourinary:    Comments: Breast and pelvic exams deferred with shared decision making Musculoskeletal:        General: No swelling, tenderness, deformity or signs of injury.     Cervical back: Normal  range of motion and neck supple. No rigidity. No muscular tenderness.     Right lower leg: No edema.     Left lower leg: No edema.  Lymphadenopathy:     Cervical: No cervical adenopathy.  Skin:    General: Skin is warm and dry.     Capillary Refill: Capillary refill takes less than 2 seconds.     Coloration: Skin is not jaundiced or pale.     Findings: No bruising, erythema, lesion or rash.  Neurological:     General: No focal deficit present.     Mental Status: She is alert and oriented to person, place, and time. Mental status is at baseline.     Cranial Nerves: No cranial nerve deficit.     Sensory: No sensory deficit.     Motor: No weakness.     Coordination: Coordination normal.     Gait: Gait normal.     Deep Tendon Reflexes: Reflexes normal.  Psychiatric:        Mood and Affect: Mood normal.        Behavior: Behavior normal.        Thought Content: Thought content normal.        Judgment: Judgment normal.     Results for orders placed or performed in visit on 05/12/24  Bayer DCA Hb A1c Waived   Collection Time: 05/12/24  9:31 AM  Result Value Ref Range   HB A1C (BAYER DCA - WAIVED) 5.8 (H) 4.8 - 5.6 %  Microalbumin, Urine Waived   Collection Time: 05/12/24  9:31 AM  Result Value Ref Range   Microalb, Ur Waived 80 (H) 0 - 19 mg/L   Creatinine, Urine Waived 200 10 - 300 mg/dL   Microalb/Creat Ratio 30-300 (H) <30 mg/g      Assessment & Plan:   Problem List Items Addressed This Visit       Cardiovascular and Mediastinum   Hypertension   Under good control on current regimen. Continue current regimen. Continue to monitor. Call with any concerns. Refills given. Labs drawn today.       Relevant Medications   atorvastatin  (LIPITOR) 80 MG tablet   ezetimibe  (ZETIA ) 10 MG tablet   lisinopril  (ZESTRIL ) 40 MG tablet   Other Relevant Orders   CBC with Differential/Platelet   Comprehensive metabolic panel with GFR   TSH   Aortic atherosclerosis   Will keep sugars,  BP and cholesterol under good control. Continue to monitor. Call with any concerns.       Relevant Medications   atorvastatin  (LIPITOR) 80 MG tablet   ezetimibe  (ZETIA ) 10 MG tablet   lisinopril  (ZESTRIL ) 40 MG tablet   Senile purpura   Reassured patient. Continue to monitor.      Relevant Medications   atorvastatin  (LIPITOR) 80 MG tablet   ezetimibe  (ZETIA ) 10 MG tablet   lisinopril  (ZESTRIL ) 40 MG tablet     Respiratory   COPD (chronic obstructive pulmonary disease) (HCC)   Under good control on current regimen. Continue current regimen. Continue to monitor. Call with any concerns. Refills given. Labs drawn today.       Relevant Medications   albuterol  (PROVENTIL ) (2.5 MG/3ML) 0.083% nebulizer solution   albuterol  (VENTOLIN  HFA) 108 (90 Base) MCG/ACT inhaler     Endocrine   Type 2 diabetes mellitus with other specified complication (HCC)   Stable with A1c of 5.8. Continue current regimen. Continue to monitor. Recheck 3 months.       Relevant Medications   atorvastatin  (LIPITOR) 80 MG tablet   lisinopril  (ZESTRIL ) 40 MG tablet   Semaglutide , 1 MG/DOSE, 4 MG/3ML SOPN   Other Relevant Orders   Bayer DCA Hb A1c Waived (Completed)   Microalbumin, Urine Waived (Completed)   CBC with Differential/Platelet   Comprehensive metabolic panel with GFR     Other   Hyperlipidemia   Under good control on current regimen. Continue current regimen. Continue to monitor. Call with any concerns. Refills given. Labs drawn today.        Relevant Medications   atorvastatin  (LIPITOR) 80 MG tablet   ezetimibe  (ZETIA ) 10 MG tablet   lisinopril  (ZESTRIL ) 40 MG tablet   Other Relevant Orders   CBC with Differential/Platelet   Comprehensive metabolic panel with GFR   Lipid Panel w/o Chol/HDL Ratio   Obesity (BMI 30.0-34.9)   Encouraged diet and exercise with goal of losing 1-2 lbs per week. Call with any concerns.       Other Visit Diagnoses       Routine general medical examination  at a health care facility    -  Primary   Vaccines updated. Screening labs checked today. Pap up to date. Mammo and colonoscopy up to date. Continue diet and exercise. Call with any concerns.   Relevant Orders   Bayer DCA Hb A1c  Waived (Completed)   Microalbumin, Urine Waived (Completed)   CBC with Differential/Platelet   Comprehensive metabolic panel with GFR   Lipid Panel w/o Chol/HDL Ratio   TSH     Need for pneumococcal 20-valent conjugate vaccination       Prevnar given today.   Relevant Orders   Pneumococcal conjugate vaccine 20-valent (Prevnar 20) (Completed)     Encounter for screening mammogram for malignant neoplasm of breast       Mammogram ordered today.   Relevant Orders   MM 3D SCREENING MAMMOGRAM BILATERAL BREAST        Follow up plan: Return in about 3 months (around 08/12/2024).   LABORATORY TESTING:  - Pap smear: up to date  IMMUNIZATIONS:   - Tdap: Tetanus vaccination status reviewed: last tetanus booster within 10 years. - Influenza: Up to date - Pneumovax: Up to date - Prevnar: Administered today - COVID: Up to date - HPV: Not applicable - Shingrix  vaccine: Up to date  SCREENING: -Mammogram: Ordered today  - Colonoscopy: Up to date   PATIENT COUNSELING:   Advised to take 1 mg of folate supplement per day if capable of pregnancy.   Sexuality: Discussed sexually transmitted diseases, partner selection, use of condoms, avoidance of unintended pregnancy  and contraceptive alternatives.   Advised to avoid cigarette smoking.  I discussed with the patient that most people either abstain from alcohol or drink within safe limits (<=14/week and <=4 drinks/occasion for males, <=7/weeks and <= 3 drinks/occasion for females) and that the risk for alcohol disorders and other health effects rises proportionally with the number of drinks per week and how often a drinker exceeds daily limits.  Discussed cessation/primary prevention of drug use and availability of  treatment for abuse.   Diet: Encouraged to adjust caloric intake to maintain  or achieve ideal body weight, to reduce intake of dietary saturated fat and total fat, to limit sodium intake by avoiding high sodium foods and not adding table salt, and to maintain adequate dietary potassium and calcium  preferably from fresh fruits, vegetables, and low-fat dairy products.    stressed the importance of regular exercise  Injury prevention: Discussed safety belts, safety helmets, smoke detector, smoking near bedding or upholstery.   Dental health: Discussed importance of regular tooth brushing, flossing, and dental visits.    NEXT PREVENTATIVE PHYSICAL DUE IN 1 YEAR. Return in about 3 months (around 08/12/2024).

## 2024-05-12 NOTE — Assessment & Plan Note (Signed)
 Will keep sugars, BP and cholesterol under good control. Continue to monitor. Call with any concerns.

## 2024-05-12 NOTE — Assessment & Plan Note (Signed)
 Encouraged diet and exercise with goal of losing 1-2lbs per week. Call with any concerns.

## 2024-05-12 NOTE — Assessment & Plan Note (Signed)
 Stable with A1c of 5.8. Continue current regimen. Continue to monitor. Recheck 3 months.

## 2024-05-12 NOTE — Assessment & Plan Note (Signed)
 Reassured patient. Continue to monitor.

## 2024-05-13 LAB — LIPID PANEL W/O CHOL/HDL RATIO
Cholesterol, Total: 180 mg/dL (ref 100–199)
HDL: 45 mg/dL (ref >39)
LDL Chol Calc (NIH): 111 mg/dL — ABNORMAL HIGH (ref 0–99)
Triglycerides: 134 mg/dL (ref 0–149)
VLDL Cholesterol Cal: 24 mg/dL (ref 5–40)

## 2024-05-13 LAB — COMPREHENSIVE METABOLIC PANEL WITH GFR
ALT: 28 IU/L (ref 0–32)
AST: 32 IU/L (ref 0–40)
Albumin: 3.9 g/dL (ref 3.9–4.9)
Alkaline Phosphatase: 140 IU/L — ABNORMAL HIGH (ref 49–135)
BUN/Creatinine Ratio: 9 — ABNORMAL LOW (ref 12–28)
BUN: 6 mg/dL — ABNORMAL LOW (ref 8–27)
Bilirubin Total: 0.5 mg/dL (ref 0.0–1.2)
CO2: 23 mmol/L (ref 20–29)
Calcium: 9.2 mg/dL (ref 8.7–10.3)
Chloride: 103 mmol/L (ref 96–106)
Creatinine, Ser: 0.66 mg/dL (ref 0.57–1.00)
Globulin, Total: 3 g/dL (ref 1.5–4.5)
Glucose: 85 mg/dL (ref 70–99)
Potassium: 4.1 mmol/L (ref 3.5–5.2)
Sodium: 142 mmol/L (ref 134–144)
Total Protein: 6.9 g/dL (ref 6.0–8.5)
eGFR: 98 mL/min/1.73 (ref >59)

## 2024-05-13 LAB — CBC WITH DIFFERENTIAL/PLATELET
Basophils Absolute: 0.1 x10E3/uL (ref 0.0–0.2)
Basos: 1 %
EOS (ABSOLUTE): 0.3 x10E3/uL (ref 0.0–0.4)
Eos: 4 %
Hematocrit: 49.8 % — ABNORMAL HIGH (ref 34.0–46.6)
Hemoglobin: 16.4 g/dL — ABNORMAL HIGH (ref 11.1–15.9)
Immature Grans (Abs): 0 x10E3/uL (ref 0.0–0.1)
Immature Granulocytes: 0 %
Lymphocytes Absolute: 2.8 x10E3/uL (ref 0.7–3.1)
Lymphs: 36 %
MCH: 30.6 pg (ref 26.6–33.0)
MCHC: 32.9 g/dL (ref 31.5–35.7)
MCV: 93 fL (ref 79–97)
Monocytes Absolute: 0.5 x10E3/uL (ref 0.1–0.9)
Monocytes: 6 %
Neutrophils Absolute: 4.2 x10E3/uL (ref 1.4–7.0)
Neutrophils: 53 %
Platelets: 241 x10E3/uL (ref 150–450)
RBC: 5.36 x10E6/uL — ABNORMAL HIGH (ref 3.77–5.28)
RDW: 12.8 % (ref 11.7–15.4)
WBC: 7.8 x10E3/uL (ref 3.4–10.8)

## 2024-05-13 LAB — TSH: TSH: 1.32 u[IU]/mL (ref 0.450–4.500)

## 2024-05-15 ENCOUNTER — Ambulatory Visit: Admitting: Family Medicine

## 2024-05-15 ENCOUNTER — Ambulatory Visit: Admitting: Internal Medicine

## 2024-05-15 ENCOUNTER — Encounter: Payer: Self-pay | Admitting: Family Medicine

## 2024-05-15 ENCOUNTER — Other Ambulatory Visit: Payer: Self-pay

## 2024-05-15 ENCOUNTER — Ambulatory Visit: Payer: Self-pay

## 2024-05-15 ENCOUNTER — Ambulatory Visit: Payer: Self-pay | Admitting: Family Medicine

## 2024-05-15 VITALS — BP 126/81 | HR 69 | Temp 97.9°F | Ht 62.0 in | Wt 173.0 lb

## 2024-05-15 DIAGNOSIS — T881XXA Other complications following immunization, not elsewhere classified, initial encounter: Secondary | ICD-10-CM | POA: Diagnosis not present

## 2024-05-15 MED ORDER — DOXYCYCLINE HYCLATE 100 MG PO TABS
100.0000 mg | ORAL_TABLET | Freq: Two times a day (BID) | ORAL | 0 refills | Status: AC
  Filled 2024-05-15: qty 20, 10d supply, fill #0

## 2024-05-15 NOTE — Telephone Encounter (Signed)
 No availability at PCP office today so patient is scheduled at [facility] [place]  FYI Only or Action Required?: FYI only for provider: appointment scheduled on 05/15/2024 at 11am with Dr Leita Adie at [facility].  Patient was last seen in primary care on 05/12/2024 by Vicci Duwaine SQUIBB, DO.  Called Nurse Triage reporting Medication Reaction.  Symptoms began several days ago.  Interventions attempted: Nothing.  Symptoms are: gradually worsening.  Triage Disposition: See PCP When Office is Open (Within 3 Days)  Patient/caregiver understands and will follow disposition?: Yes                  Copied from CRM #[id number]. Topic: Clinical - Red Word Triage >> May 15, 2024  9:20 AM Treva T wrote: [facility] that prompted transfer to Nurse Triage: Pt spouse, donald calling in regards to pt, states she recently received a pneumonia shot, and thinks she is having an allergic reaction to shot.  Symptoms include redness, swelling and pain at site. Reason for Disposition  [1] Pain, tenderness, or swelling at the injection site AND [2] persists > 3 days  Answer Assessment - Initial Assessment Questions Pneumonia shot two days ago Swollen six inches around the injections site, warmth, redness Patient denies hives, itching, chest pain, nausea, vomiting, fever, or difficulty breathing Patient had a pneumonia shot a few years ago and had a bad reaction but was told this one was different  Patient's husband noticed that there are some hives/small bumps in the area of the injection site and patient states that sometimes it does itch  They are advised the it is recommended that she be seen today for further evaluation of this due to the hives/itching noted in the area and having a previous reaction years ago to another pneumonia shot   Patient is advised to call us  back if anything changes or with any further questions/concerns. Patient is advised that if anything worsens to  go to the Emergency Room. Patient verbalized understanding.  Protocols used: Immunization Reactions-A-AH

## 2024-05-15 NOTE — Telephone Encounter (Signed)
 I can see her at 27

## 2024-05-15 NOTE — Progress Notes (Signed)
 BP 126/81   Pulse 69   Temp 97.9 F (36.6 C) (Oral)   Ht 5' 2 (1.575 m)   Wt 173 lb (78.5 kg)   SpO2 96%   BMI 31.64 kg/m    Subjective:    Patient ID: Monica Hull, female    DOB: 08/06/1960, 63 y.o.   MRN: [id number]  HPI: Monica Hull is a 63 y.o. female  Chief Complaint  Patient presents with   Allergic Reaction   SKIN IRRITATION Duration: 3 days Location: L arm History of trauma in area: no Pain: no Quality: no pain Severity: mild Redness: yes Swelling: no Oozing: no Pus: no Fevers: no Nausea/vomiting: no Status: stable Treatments attempted:none  Tetanus: UTD  Relevant past medical, surgical, family and social history reviewed and updated as indicated. Interim medical history since our last visit reviewed. Allergies and medications reviewed and updated.  Review of Systems  Constitutional: Negative.   Respiratory: Negative.    Cardiovascular: Negative.     Per HPI unless specifically indicated above     Objective:    BP 126/81   Pulse 69   Temp 97.9 F (36.6 C) (Oral)   Ht 5' 2 (1.575 m)   Wt 173 lb (78.5 kg)   SpO2 96%   BMI 31.64 kg/m   Wt Readings from Last 3 Encounters:  05/15/24 173 lb (78.5 kg)  05/12/24 172 lb 12.8 oz (78.4 kg)  02/10/24 176 lb 3.2 oz (79.9 kg)    Physical Exam Vitals and nursing note reviewed.  Constitutional:      General: She is not in acute distress.    Appearance: Normal appearance. She is not ill-appearing, toxic-appearing or diaphoretic.  HENT:     Head: Normocephalic and atraumatic.     Right Ear: External ear normal.     Left Ear: External ear normal.     Nose: Nose normal.     Mouth/Throat:     Mouth: Mucous membranes are moist.     Pharynx: Oropharynx is clear.  Eyes:     General: No scleral icterus.       Right eye: No discharge.        Left eye: No discharge.     Extraocular Movements: Extraocular movements intact.     Conjunctiva/sclera: Conjunctivae normal.      Pupils: Pupils are equal, round, and reactive to light.  Cardiovascular:     Rate and Rhythm: Normal rate and regular rhythm.     Pulses: Normal pulses.     Heart sounds: Normal heart sounds. No murmur heard.    No friction rub. No gallop.  Pulmonary:     Effort: Pulmonary effort is normal. No respiratory distress.     Breath sounds: Normal breath sounds. No stridor. No wheezing, rhonchi or rales.  Chest:     Chest wall: No tenderness.  Musculoskeletal:        General: Normal range of motion.     Cervical back: Normal range of motion and neck supple.  Skin:    General: Skin is warm and dry.     Capillary Refill: Capillary refill takes less than 2 seconds.     Coloration: Skin is not jaundiced or pale.     Findings: Erythema (redness on R arm, no rash, no increased heat) present. No bruising, lesion or rash.  Neurological:     General: No focal deficit present.     Mental Status: She is alert and oriented to person, place, and time.  Mental status is at baseline.  Psychiatric:        Mood and Affect: Mood normal.        Behavior: Behavior normal.        Thought Content: Thought content normal.        Judgment: Judgment normal.     Results for orders placed or performed in visit on 05/12/24  Bayer DCA Hb A1c Waived   Collection Time: 05/12/24  9:31 AM  Result Value Ref Range   HB A1C (BAYER DCA - WAIVED) 5.8 (H) 4.8 - 5.6 %  Microalbumin, Urine Waived   Collection Time: 05/12/24  9:31 AM  Result Value Ref Range   Microalb, Ur Waived 80 (H) 0 - 19 mg/L   Creatinine, Urine Waived 200 10 - 300 mg/dL   Microalb/Creat Ratio 30-300 (H) <30 mg/g  CBC with Differential/Platelet   Collection Time: 05/12/24  9:32 AM  Result Value Ref Range   WBC 7.8 3.4 - 10.8 x10E3/uL   RBC 5.36 (H) 3.77 - 5.28 x10E6/uL   Hemoglobin 16.4 (H) 11.1 - 15.9 g/dL   Hematocrit 50.1 (H) 65.9 - 46.6 %   MCV 93 79 - 97 fL   MCH 30.6 26.6 - 33.0 pg   MCHC 32.9 31.5 - 35.7 g/dL   RDW 87.1 88.2 - 84.5 %    Platelets 241 150 - 450 x10E3/uL   Neutrophils 53 Not Estab. %   Lymphs 36 Not Estab. %   Monocytes 6 Not Estab. %   Eos 4 Not Estab. %   Basos 1 Not Estab. %   Neutrophils Absolute 4.2 1.4 - 7.0 x10E3/uL   Lymphocytes Absolute 2.8 0.7 - 3.1 x10E3/uL   Monocytes Absolute 0.5 0.1 - 0.9 x10E3/uL   EOS (ABSOLUTE) 0.3 0.0 - 0.4 x10E3/uL   Basophils Absolute 0.1 0.0 - 0.2 x10E3/uL   Immature Granulocytes 0 Not Estab. %   Immature Grans (Abs) 0.0 0.0 - 0.1 x10E3/uL  Comprehensive metabolic panel with GFR   Collection Time: 05/12/24  9:32 AM  Result Value Ref Range   Glucose 85 70 - 99 mg/dL   BUN 6 (L) 8 - 27 mg/dL   Creatinine, Ser 9.33 0.57 - 1.00 mg/dL   eGFR 98 >40 fO/fpw/8.26   BUN/Creatinine Ratio 9 (L) 12 - 28   Sodium 142 134 - 144 mmol/L   Potassium 4.1 3.5 - 5.2 mmol/L   Chloride 103 96 - 106 mmol/L   CO2 23 20 - 29 mmol/L   Calcium  9.2 8.7 - 10.3 mg/dL   Total Protein 6.9 6.0 - 8.5 g/dL   Albumin 3.9 3.9 - 4.9 g/dL   Globulin, Total 3.0 1.5 - 4.5 g/dL   Bilirubin Total 0.5 0.0 - 1.2 mg/dL   Alkaline Phosphatase 140 (H) 49 - 135 IU/L   AST 32 0 - 40 IU/L   ALT 28 0 - 32 IU/L  Lipid Panel w/o Chol/HDL Ratio   Collection Time: 05/12/24  9:32 AM  Result Value Ref Range   Cholesterol, Total 180 100 - 199 mg/dL   Triglycerides 865 0 - 149 mg/dL   HDL 45 >60 mg/dL   VLDL Cholesterol Cal 24 5 - 40 mg/dL   LDL Chol Calc (NIH) 888 (H) 0 - 99 mg/dL  TSH   Collection Time: 05/12/24  9:32 AM  Result Value Ref Range   TSH 1.320 0.450 - 4.500 uIU/mL      Assessment & Plan:   Problem List Items Addressed This Visit  None Visit Diagnoses       Post-immunization reaction, initial encounter    -  Primary   Does not appear to be infected or allergic- advised ice packs. Rx for doxycycline  given in case it gets redder or hotter over the weekend. Call with concerns.        Follow up plan: Return for As scheduled.

## 2024-05-15 NOTE — Telephone Encounter (Signed)
 Scheduled

## 2024-05-27 ENCOUNTER — Ambulatory Visit

## 2024-05-27 ENCOUNTER — Ambulatory Visit: Admitting: Pulmonary Disease

## 2024-05-27 DIAGNOSIS — J9801 Acute bronchospasm: Secondary | ICD-10-CM | POA: Diagnosis not present

## 2024-05-27 DIAGNOSIS — J441 Chronic obstructive pulmonary disease with (acute) exacerbation: Secondary | ICD-10-CM

## 2024-05-27 LAB — PULMONARY FUNCTION TEST
DL/VA % pred: 97 %
DL/VA: 4.15 ml/min/mmHg/L
DLCO cor % pred: 91 %
DLCO cor: 16.89 ml/min/mmHg
DLCO unc % pred: 99 %
DLCO unc: 18.27 ml/min/mmHg
FEF 25-75 Post: 1.43 L/s
FEF 25-75 Pre: 1.54 L/s
FEF2575-%Change-Post: -7 %
FEF2575-%Pred-Post: 69 %
FEF2575-%Pred-Pre: 74 %
FEV1-%Change-Post: -2 %
FEV1-%Pred-Post: 83 %
FEV1-%Pred-Pre: 86 %
FEV1-Post: 1.89 L
FEV1-Pre: 1.95 L
FEV1FVC-%Change-Post: -1 %
FEV1FVC-%Pred-Pre: 99 %
FEV6-%Change-Post: 0 %
FEV6-%Pred-Post: 88 %
FEV6-%Pred-Pre: 89 %
FEV6-Post: 2.5 L
FEV6-Pre: 2.53 L
FEV6FVC-%Change-Post: 0 %
FEV6FVC-%Pred-Post: 103 %
FEV6FVC-%Pred-Pre: 103 %
FVC-%Change-Post: 0 %
FVC-%Pred-Post: 85 %
FVC-%Pred-Pre: 86 %
FVC-Post: 2.51 L
FVC-Pre: 2.54 L
Post FEV1/FVC ratio: 75 %
Post FEV6/FVC ratio: 100 %
Pre FEV1/FVC ratio: 77 %
Pre FEV6/FVC Ratio: 100 %
RV % pred: 120 %
RV: 2.36 L
TLC % pred: 112 %
TLC: 5.35 L

## 2024-05-27 NOTE — Patient Instructions (Signed)
 Full PFT completed today ? ?

## 2024-05-27 NOTE — Progress Notes (Signed)
 Full PFT completed today ? ?

## 2024-06-01 ENCOUNTER — Ambulatory Visit
Admission: RE | Admit: 2024-06-01 | Discharge: 2024-06-01 | Disposition: A | Discharge status: Home / Self Care | Source: Ambulatory Visit | Attending: Acute Care | Admitting: Acute Care

## 2024-06-01 DIAGNOSIS — F17210 Nicotine dependence, cigarettes, uncomplicated: Secondary | ICD-10-CM | POA: Diagnosis not present

## 2024-06-01 DIAGNOSIS — I700 Atherosclerosis of aorta: Secondary | ICD-10-CM | POA: Insufficient documentation

## 2024-06-01 DIAGNOSIS — I7121 Aneurysm of the ascending aorta, without rupture: Secondary | ICD-10-CM | POA: Diagnosis not present

## 2024-06-01 DIAGNOSIS — J439 Emphysema, unspecified: Secondary | ICD-10-CM | POA: Diagnosis not present

## 2024-06-01 DIAGNOSIS — Z87891 Personal history of nicotine dependence: Secondary | ICD-10-CM | POA: Diagnosis present

## 2024-06-01 DIAGNOSIS — Z122 Encounter for screening for malignant neoplasm of respiratory organs: Secondary | ICD-10-CM | POA: Insufficient documentation

## 2024-06-01 DIAGNOSIS — I2510 Atherosclerotic heart disease of native coronary artery without angina pectoris: Secondary | ICD-10-CM | POA: Insufficient documentation

## 2024-06-03 ENCOUNTER — Other Ambulatory Visit (HOSPITAL_COMMUNITY): Payer: Self-pay

## 2024-06-03 ENCOUNTER — Telehealth: Payer: Self-pay | Admitting: Pharmacy Technician

## 2024-06-03 NOTE — Telephone Encounter (Signed)
 Pharmacy Patient Advocate Encounter   Received notification from Onbase that prior authorization for Ozempic  (1 MG/DOSE) 4MG /3ML pen-injectors is due for renewal.   Insurance verification completed.   The patient is insured through [facility] MEDICAID.  Action: Medication is now available without a prior authorization.

## 2024-06-05 ENCOUNTER — Other Ambulatory Visit: Payer: Self-pay | Admitting: Acute Care

## 2024-06-05 DIAGNOSIS — Z87891 Personal history of nicotine dependence: Secondary | ICD-10-CM

## 2024-06-05 DIAGNOSIS — Z419 Encounter for procedure for purposes other than remedying health state, unspecified: Secondary | ICD-10-CM | POA: Diagnosis not present

## 2024-06-05 DIAGNOSIS — F17210 Nicotine dependence, cigarettes, uncomplicated: Secondary | ICD-10-CM

## 2024-06-05 DIAGNOSIS — Z122 Encounter for screening for malignant neoplasm of respiratory organs: Secondary | ICD-10-CM

## 2024-06-09 ENCOUNTER — Ambulatory Visit: Payer: Self-pay

## 2024-06-09 NOTE — Telephone Encounter (Signed)
° ° °  Copied from CRM #[id number]. Topic: Clinical - Prescription Issue >> Jun 09, 2024  7:55 AM Darshell M wrote: Reason for CRM: Patient's husband is requesting the provider call in tessalon  perles for patient's lingering cough. Medication is not listed in patient's medications.

## 2024-06-09 NOTE — Telephone Encounter (Signed)
 FYI Only or Action Required?: FYI only for provider: appointment scheduled on 06/10/24.  Patient was last seen in primary care on 05/15/2024 by Vicci Duwaine SQUIBB, DO.  Called Nurse Triage reporting Cough.  Symptoms began yesterday.  Interventions attempted: Nothing.  Symptoms are: stable.  Triage Disposition: See PCP When Office is Open (Within 3 Days)  Patient/caregiver understands and will follow disposition?: Yes  Reason for Disposition  Cough has been present for > 3 weeks  Answer Assessment - Initial Assessment Questions Patient uses inhalers and nebulizer solution. Patient already has virtual appointment scheduled for tomorrow am.   1. ONSET: When did the cough begin?      Yesterday  2. SPUTUM: Describe the color of your sputum (e.g., none, dry cough; clear, white, yellow, green)     Dry cough  3. DIFFICULTY BREATHING: Are you having difficulty breathing? If Yes, ask: How bad is it? (e.g., mild, moderate, severe)      Mild SOB is baseline, feels its a little worse  4. FEVER: Do you have a fever? If Yes, ask: What is your temperature, how was it measured, and when did it start?     Denies  5. CARDIAC HISTORY: Do you have any history of heart disease? (e.g., heart attack, congestive heart failure)      Denies  6. LUNG HISTORY: Do you have any history of lung disease?  (e.g., pulmonary embolus, asthma, emphysema)     COPD  7. OTHER SYMPTOMS: Do you have any other symptoms? (e.g., runny nose, wheezing, chest pain)       Head pressure, nasal congestion.  Protocols used: Cough - Acute Non-Productive-A-AH

## 2024-06-10 ENCOUNTER — Other Ambulatory Visit: Payer: Self-pay

## 2024-06-10 ENCOUNTER — Telehealth

## 2024-06-10 VITALS — Ht 62.01 in | Wt 172.0 lb

## 2024-06-10 DIAGNOSIS — R059 Cough, unspecified: Secondary | ICD-10-CM | POA: Diagnosis not present

## 2024-06-10 DIAGNOSIS — J3489 Other specified disorders of nose and nasal sinuses: Secondary | ICD-10-CM

## 2024-06-10 MED ORDER — BENZONATATE 100 MG PO CAPS
100.0000 mg | ORAL_CAPSULE | Freq: Two times a day (BID) | ORAL | 0 refills | Status: AC | PRN
  Filled 2024-06-10: qty 20, 10d supply, fill #0

## 2024-06-10 NOTE — Progress Notes (Cosign Needed)
 Ht 5' 2.01 (1.575 m)   Wt 172 lb (78 kg)   BMI 31.45 kg/m    Subjective:    Patient ID: Monica Hull, female    DOB: 02/25/1961, 63 y.o.   MRN: [id number]  HPI: Monica Hull is a 63 y.o. female presenting for video visit.  She is reporting a dry cough x days with sinus pressure x5 days, with chills, and mild nausea x1 episdoe.  She feels slightly more SOB when sitting or laying.  She is using nebulizer and inhaler about once per day, but not more than usual. Denies cp/palpitations.  Not coughing up any sputum or nasal congestion.  She is following with her pulmonologist tomorrow regarding recent CT scans/follow up for COPD.   Chief Complaint  Patient presents with   Cough    Patient stated it's a dry cough, it started Saturday of last week. Patient hasn't taken anything for it.     Relevant past medical, surgical, family and social history reviewed and updated as indicated. Interim medical history since our last visit reviewed. Allergies and medications reviewed and updated.  Review of Systems  Constitutional:  Positive for chills. Negative for fever.  HENT:  Positive for congestion, postnasal drip, rhinorrhea, sinus pressure and sinus pain. Negative for sore throat.   Respiratory:  Positive for cough, shortness of breath and wheezing.   Gastrointestinal: Negative.   Musculoskeletal: Negative.     Per HPI unless specifically indicated above     Objective:    Ht 5' 2.01 (1.575 m)   Wt 172 lb (78 kg)   BMI 31.45 kg/m   Wt Readings from Last 3 Encounters:  06/10/24 172 lb (78 kg)  05/27/24 171 lb 12.8 oz (77.9 kg)  05/15/24 173 lb (78.5 kg)    Physical Exam Constitutional:      Appearance: Normal appearance.  Pulmonary:     Effort: Pulmonary effort is normal.  Neurological:     Mental Status: She is alert.   She is generally well appearing in the video visit, does not appear to be in acute respiratory distress, is able to speak in full  sentence, and coughed once during the visit.  Results for orders placed or performed in visit on 05/27/24  Pulmonary function test   Collection Time: 05/27/24  8:35 AM  Result Value Ref Range   FVC-Pre 2.54 L   FVC-%Pred-Pre 86 %   FVC-Post 2.51 L   FVC-%Pred-Post 85 %   FVC-%Change-Post 0 %   FEV1-Pre 1.95 L   FEV1-%Pred-Pre 86 %   FEV1-Post 1.89 L   FEV1-%Pred-Post 83 %   FEV1-%Change-Post -2 %   FEV6-Pre 2.53 L   FEV6-%Pred-Pre 89 %   FEV6-Post 2.50 L   FEV6-%Pred-Post 88 %   FEV6-%Change-Post 0 %   Pre FEV1/FVC ratio 77 %   FEV1FVC-%Pred-Pre 99 %   Post FEV1/FVC ratio 75 %   FEV1FVC-%Change-Post -1 %   Pre FEV6/FVC Ratio 100 %   FEV6FVC-%Pred-Pre 103 %   Post FEV6/FVC ratio 100 %   FEV6FVC-%Pred-Post 103 %   FEV6FVC-%Change-Post 0 %   FEF 25-75 Pre 1.54 L/sec   FEF2575-%Pred-Pre 74 %   FEF 25-75 Post 1.43 L/sec   FEF2575-%Pred-Post 69 %   FEF2575-%Change-Post -7 %   RV 2.36 L   RV % pred 120 %   TLC 5.35 L   TLC % pred 112 %   DLCO unc 18.27 ml/min/mmHg   DLCO unc % pred 99 %  DLCO cor 16.89 ml/min/mmHg   DLCO cor % pred 91 %   DL/VA 5.84 ml/min/mmHg/L   DL/VA % pred 97 %      Assessment & Plan:   Assessment & Plan Cough, unspecified type Pt does not appear to be in acute respiratory distress.  She is requesting tessalon  perles for the cough and will follow with her pulmonologist for regularly scheduled appointment tomorrow.  Follow up in 5-7 days for virtual visit, or in person if feeling worse for lung check. Orders:   benzonatate  (TESSALON ) 100 MG capsule; Take 1 capsule (100 mg total) by mouth 2 (two) times daily as needed for cough.  Sinus pressure Pt has leftover doxycycline  from a previous appointment - asks if she should start it now.  Encouraged pt to wait on taking the antibiotic until after seeing her pulmonologist, will reassess in 5-7 days.  She stopped taking fluticasone  due to feeling like it dried out her nose.     Follow up  plan: Follow up in 5-7 days virtual or in person.

## 2024-06-11 ENCOUNTER — Encounter: Payer: Self-pay | Admitting: Pulmonary Disease

## 2024-06-11 ENCOUNTER — Other Ambulatory Visit: Payer: Self-pay

## 2024-06-11 ENCOUNTER — Ambulatory Visit: Admitting: Pulmonary Disease

## 2024-06-11 VITALS — BP 130/82 | HR 91 | Temp 97.6°F | Ht 62.0 in | Wt 173.6 lb

## 2024-06-11 DIAGNOSIS — J418 Mixed simple and mucopurulent chronic bronchitis: Secondary | ICD-10-CM

## 2024-06-11 DIAGNOSIS — J4489 Other specified chronic obstructive pulmonary disease: Secondary | ICD-10-CM | POA: Diagnosis not present

## 2024-06-11 DIAGNOSIS — F17210 Nicotine dependence, cigarettes, uncomplicated: Secondary | ICD-10-CM

## 2024-06-11 DIAGNOSIS — J449 Chronic obstructive pulmonary disease, unspecified: Secondary | ICD-10-CM

## 2024-06-11 DIAGNOSIS — J0111 Acute recurrent frontal sinusitis: Secondary | ICD-10-CM | POA: Diagnosis not present

## 2024-06-11 MED ORDER — AMOXICILLIN-POT CLAVULANATE 875-125 MG PO TABS
1.0000 | ORAL_TABLET | Freq: Two times a day (BID) | ORAL | 0 refills | Status: AC
  Filled 2024-06-11: qty 14, 7d supply, fill #0

## 2024-06-11 NOTE — Patient Instructions (Signed)
 VISIT SUMMARY:  Monica Hull, a 63 year old female with COPD, visited today due to symptoms of acute sinusitis, including facial pressure, nasal congestion, and nasal discharge. She also has a history of COPD and inconsistent use of her inhaler and nebulizer. Her family is concerned about her smoking habits.  YOUR PLAN:  -ACUTE FRONTAL SINUSITIS: Acute frontal sinusitis is an infection of the sinuses causing symptoms like facial pressure, nasal congestion, and nasal discharge. You have been prescribed clonazepam 875 mg to be taken twice daily for 7 days. Additionally, you should take Mucinex to help with mucus thickening and increase your water intake to stay hydrated.  -CHRONIC OBSTRUCTIVE PULMONARY DISEASE (COPD): COPD is a chronic lung condition that makes it hard to breathe. It is important to use your inhaler and nebulizer consistently to manage your symptoms. You are also advised to quit smoking to improve your lung health.  INSTRUCTIONS:  Please follow the prescribed medication regimen for your sinusitis and ensure consistent use of your inhaler and nebulizer for COPD. Consider smoking cessation to improve your overall health. Drink plenty of water and take Mucinex as recommended. Follow up with your doctor if symptoms persist or worsen.

## 2024-06-11 NOTE — Progress Notes (Signed)
 Subjective:    Patient ID: Monica Hull, female    DOB: [birth year], 63 y.o.   MRN: [id number]  Patient Care Team: Vicci Duwaine SQUIBB, DO as PCP - General (Family Medicine) Perla Evalene PARAS, MD as PCP - Cardiology (Cardiology) Tamea Dedra CROME, MD as Consulting Physician (Pulmonary Disease)  Chief Complaint  Patient presents with   COPD    Shortness of breath on exertion. Occasional wheezing. Using Breztri  most days. Cough, sinus pressure and runny nose. COVID test was negative. Did video visit yesterday. Tessalon  was prescribed.     BACKGROUND/INTERVAL:This is a 63 year old female, current every day smoker currently smoking a pack a day, with 39-pack-year history of smoking. PMH significant for COPD, sleep apnea, allergic rhinitis, GERD, hypertension, carotid stenosis, diet controlled diabetes mellitus, hyperlipidemia.  I last saw the patient on 19 December 2022.  She continues to smoke 1 pack of cigarettes per day.  HPI Discussed the use of AI scribe software for clinical note transcription with the patient, who gave verbal consent to proceed.  History of Present Illness   Monica Hull is a 63 year old female with COPD who presents with symptoms of acute frontal sinusitis.  She presents with her husband, [name].  She has been experiencing facial pressure and nasal congestion since Saturday, with nasal discharge described as both clear at times and green times. She also has a dry throat and frequent sneezing with discharge.  She has some malaise but no fevers or chills.  No allergies to penicillin are noted.  She has a history of COPD and uses her inhaler and nebulizer. Her family member emphasizes the need for regular inhaler use. She smokes, though less than before, and her family member is concerned about her smoking habits. Her grandchildren are involved in reminding her to use her nebulizer.  She recently traveled to close her garden, where she was exposed  to individuals who were coughing, which may have contributed to her current symptoms.      DATA 09/26/2021 PFTs: FEV1 1.70 L or 71% predicted, FVC 2.13 L or 68% predicted, FEV1/FVC 80%, no bronchodilator response.  Lung volumes normal, diffusion capacity normal.  Patient had difficulty during the test.  No significant obstruction. 05/15/2022 sleep study, in lab: Mild obstructive sleep apnea AHI of 9.7 events per hour O2 sats 85%. 05/23/2022 LDCT chest: Mild centrilobular emphysema. Stable small bilateral pulmonary nodules largest 5.5 mm. Lung RADS 2, benign appearance or behavior. 08/21/2022 chest x-ray PA and lateral: No evidence of acute disease. 05/28/2023 LDCT chest: No pleural fluid, mild centrilobular emphysema.  No change in pulmonary nodules.  Lung RADS 2. 05/27/2024 PFTs: FEV1 1.95 L or 86% predicted, FVC 2.54 L or 86% predicted, FEV1/FVC 77%.  No bronchodilator response.  Lung volumes show mild air trapping and hyperinflation.  Diffusion capacity normal.  Consistent with mild COPD. 06/01/2024 LDCT chest: Central airways are patent.  Mild centrilobular and paraseptal emphysema.  Mild diffuse bronchial wall thickening.  No focal consolidation.  Scattered pulmonary nodules measuring up to 5.7 mm in the middle lobe, unchanged.  No pleural effusion.  Lung RADS 2.  Review of Systems A 10 point review of systems was performed and it is as noted above otherwise negative.   Patient Active Problem List   Diagnosis Date Noted   Senile purpura 05/12/2024   Cyst on ear 03/27/2023   Bilateral hearing loss 03/27/2023   Aortic atherosclerosis 04/08/2019   Syncope 03/24/2018   Allergic rhinitis 02/27/2017  Current smoker 02/20/2016   Carotid stenosis 02/16/2016   Thyroid  nodule 11/01/2015   Type 2 diabetes mellitus with other specified complication (HCC) 08/18/2015   PMB (postmenopausal bleeding) 06/30/2015   Obesity (BMI 30.0-34.9) 06/30/2015   Paresthesias 02/01/2015   DJD (degenerative  joint disease), cervical 02/01/2015   Stress incontinence 12/21/2014   Asthma    GERD (gastroesophageal reflux disease)    Sleep apnea    Hyperlipidemia    Hypertension    COPD (chronic obstructive pulmonary disease) (HCC)    Nicotine  dependence, cigarettes, w unsp disorders     Social History   Tobacco Use   Smoking status: Every Day    Current packs/day: 1.00    Average packs/day: 2.0 packs/day for 51.0 years (100.0 ttl pk-yrs)    Types: Cigarettes    Start date: 1975   Smokeless tobacco: Never  Substance Use Topics   Alcohol use: Not Currently    Comment: rarely    Allergies[1]  Active Medications[2]  Immunization History  Administered Date(s) Administered   Influenza, Seasonal, Injecte, Preservative Fre 04/27/2024   Influenza,inj,Quad PF,6+ Mos 03/07/2015, 02/29/2016, 05/09/2017, 03/24/2018, 03/05/2019, 04/14/2020, 04/25/2021   Influenza-Unspecified 03/25/2014, 04/07/2022, 02/24/2023   PFIZER(Purple Top)SARS-COV-2 Vaccination 09/10/2019, 10/06/2019, 04/14/2020   PNEUMOCOCCAL CONJUGATE-20 05/12/2024   Pfizer(Comirnaty)Fall Seasonal Vaccine 12 years and older 06/14/2022, 04/27/2024   Pneumococcal Polysaccharide-23 09/17/2014   Respiratory Syncytial Virus Vaccine,Recomb Aduvanted(Arexvy) 04/07/2022   Tdap 12/21/2014   Unspecified SARS-COV-2 Vaccination 02/24/2023   Zoster Recombinant(Shingrix ) 02/07/2021, 09/10/2022        Objective:     Vitals:   06/11/24 0911  BP: 130/82  Pulse: 91  Temp: 97.6 F (36.4 C)  Height: 5' 2 (1.575 m)  Weight: 173 lb 9.6 oz (78.7 kg)  SpO2: 96%  TempSrc: Temporal  BMI (Calculated): 31.74     SpO2: 96 %  GENERAL: Overweight woman, no acute distress, fully ambulatory, appears acutely ill but nontoxic.  No conversational dyspnea.  Looks much older than stated age, plethoric. HEAD: Normocephalic, atraumatic.  Frontal sinus tenderness. EYES: Pupils equal, round, reactive to light.  No scleral icterus.  MOUTH:  Nose/mouth/throat not examined due to institutional masking requirements.SABRA  NECK: Supple. No thyromegaly. Trachea midline. No JVD.  No adenopathy. PULMONARY: Diminished air sounds bilaterally.  Diffuse end expiratory wheezes and rhonchi noted. CARDIOVASCULAR: S1 and S2. Regular rate and rhythm.  No rubs, murmurs or gallops heard. ABDOMEN: Obese otherwise benign. MUSCULOSKELETAL: No joint deformity, no clubbing, no edema.  NEUROLOGIC: Neuro grossly normal. SKIN: Intact,warm,dry. PSYCH: Mood and behavior normal.   Recent Results (from the past 2160 hours)  Bayer DCA Hb A1c Waived     Status: Abnormal   Collection Time: 05/12/24  9:31 AM  Result Value Ref Range   HB A1C (BAYER DCA - WAIVED) 5.8 (H) 4.8 - 5.6 %    Comment:          Prediabetes: 5.7 - 6.4          Diabetes: >6.4          Glycemic control for adults with diabetes: <7.0   Microalbumin, Urine Waived     Status: Abnormal   Collection Time: 05/12/24  9:31 AM  Result Value Ref Range   Microalb, Ur Waived 80 (H) 0 - 19 mg/L   Creatinine, Urine Waived 200 10 - 300 mg/dL   Microalb/Creat Ratio 30-300 (H) <30 mg/g    Comment:  Abnormal:       30 - 300                         High Abnormal:           >300   CBC with Differential/Platelet     Status: Abnormal   Collection Time: 05/12/24  9:32 AM  Result Value Ref Range   WBC 7.8 3.4 - 10.8 x10E3/uL   RBC 5.36 (H) 3.77 - 5.28 x10E6/uL   Hemoglobin 16.4 (H) 11.1 - 15.9 g/dL   Hematocrit 50.1 (H) 65.9 - 46.6 %   MCV 93 79 - 97 fL   MCH 30.6 26.6 - 33.0 pg   MCHC 32.9 31.5 - 35.7 g/dL   RDW 87.1 88.2 - 84.5 %   Platelets 241 150 - 450 x10E3/uL   Neutrophils 53 Not Estab. %   Lymphs 36 Not Estab. %   Monocytes 6 Not Estab. %   Eos 4 Not Estab. %   Basos 1 Not Estab. %   Neutrophils Absolute 4.2 1.4 - 7.0 x10E3/uL   Lymphocytes Absolute 2.8 0.7 - 3.1 x10E3/uL   Monocytes Absolute 0.5 0.1 - 0.9 x10E3/uL   EOS (ABSOLUTE) 0.3 0.0 - 0.4 x10E3/uL    Basophils Absolute 0.1 0.0 - 0.2 x10E3/uL   Immature Granulocytes 0 Not Estab. %   Immature Grans (Abs) 0.0 0.0 - 0.1 x10E3/uL  Comprehensive metabolic panel with GFR     Status: Abnormal   Collection Time: 05/12/24  9:32 AM  Result Value Ref Range   Glucose 85 70 - 99 mg/dL   BUN 6 (L) 8 - 27 mg/dL   Creatinine, Ser 9.33 0.57 - 1.00 mg/dL   eGFR 98 >40 fO/fpw/8.26   BUN/Creatinine Ratio 9 (L) 12 - 28   Sodium 142 134 - 144 mmol/L   Potassium 4.1 3.5 - 5.2 mmol/L   Chloride 103 96 - 106 mmol/L   CO2 23 20 - 29 mmol/L   Calcium  9.2 8.7 - 10.3 mg/dL   Total Protein 6.9 6.0 - 8.5 g/dL   Albumin 3.9 3.9 - 4.9 g/dL   Globulin, Total 3.0 1.5 - 4.5 g/dL   Bilirubin Total 0.5 0.0 - 1.2 mg/dL   Alkaline Phosphatase 140 (H) 49 - 135 IU/L   AST 32 0 - 40 IU/L   ALT 28 0 - 32 IU/L  Lipid Panel w/o Chol/HDL Ratio     Status: Abnormal   Collection Time: 05/12/24  9:32 AM  Result Value Ref Range   Cholesterol, Total 180 100 - 199 mg/dL   Triglycerides 865 0 - 149 mg/dL   HDL 45 >60 mg/dL   VLDL Cholesterol Cal 24 5 - 40 mg/dL   LDL Chol Calc (NIH) 888 (H) 0 - 99 mg/dL  TSH     Status: None   Collection Time: 05/12/24  9:32 AM  Result Value Ref Range   TSH 1.320 0.450 - 4.500 uIU/mL  Pulmonary function test     Status: None   Collection Time: 05/27/24  8:35 AM  Result Value Ref Range   FVC-Pre 2.54 L   FVC-%Pred-Pre 86 %   FVC-Post 2.51 L   FVC-%Pred-Post 85 %   FVC-%Change-Post 0 %   FEV1-Pre 1.95 L   FEV1-%Pred-Pre 86 %   FEV1-Post 1.89 L   FEV1-%Pred-Post 83 %   FEV1-%Change-Post -2 %   FEV6-Pre 2.53 L   FEV6-%Pred-Pre 89 %   FEV6-Post 2.50 L  FEV6-%Pred-Post 88 %   FEV6-%Change-Post 0 %   Pre FEV1/FVC ratio 77 %   FEV1FVC-%Pred-Pre 99 %   Post FEV1/FVC ratio 75 %   FEV1FVC-%Change-Post -1 %   Pre FEV6/FVC Ratio 100 %   FEV6FVC-%Pred-Pre 103 %   Post FEV6/FVC ratio 100 %   FEV6FVC-%Pred-Post 103 %   FEV6FVC-%Change-Post 0 %   FEF 25-75 Pre 1.54 L/sec    FEF2575-%Pred-Pre 74 %   FEF 25-75 Post 1.43 L/sec   FEF2575-%Pred-Post 69 %   FEF2575-%Change-Post -7 %   RV 2.36 L   RV % pred 120 %   TLC 5.35 L   TLC % pred 112 %   DLCO unc 18.27 ml/min/mmHg   DLCO unc % pred 99 %   DLCO cor 16.89 ml/min/mmHg   DLCO cor % pred 91 %   DL/VA 5.84 ml/min/mmHg/L   DL/VA % pred 97 %  *Discussed PFTs with patient.    Assessment & Plan:     ICD-10-CM   1. Stage 1 mild COPD by GOLD classification (HCC)  J44.9     2. Mixed simple and mucopurulent chronic bronchitis (HCC)  J41.8     3. Acute recurrent frontal sinusitis  J01.11     4. Tobacco dependence due to cigarettes  F17.210      Meds ordered this encounter  Medications   amoxicillin -clavulanate (AUGMENTIN ) 875-125 MG tablet    Sig: Take 1 tablet by mouth 2 (two) times daily for 7 days.    Dispense:  14 tablet    Refill:  0   Discussion:    Acute frontal sinusitis Nasal congestion, rhinorrhea, and green nasal discharge. No penicillin allergy. No need for prednisone . - Prescribed Augmentin  875 mg BID for 7 days - Recommended Mucinex to assist in mucus clearance - Advised increased water intake, stay well-hydrated  Chronic obstructive pulmonary disease (COPD) Mild COPD with no exacerbation. Inconsistent inhaler and nebulizer use. Recent lung function tests indicate COPD stage I stage I COPD. - Emphasized consistent inhaler (Breztri ) and nebulizer use as needed - Advised smoking cessation     Smoking/Tobacco Cessation Counseling Melynda Krzywicki Horlacher is a current user of tobacco or nicotine  products. She is considering quitting at this time. Counseling provided today addressed the risks of continued use and the benefits of cessation. Discussed tobacco/nicotine  use history, readiness to quit, and evidence-based treatment options including behavioral strategies, support resources, and pharmacologic therapies. Provided encouragement and educational materials on steps and resources to  quit smoking. Patient questions were addressed, and follow-up recommended for continued support. Total time spent on counseling: 5 minutes.   Will see the patient in follow-up in 3 months time.   Advised if symptoms do not improve or worsen, to please contact office for sooner follow up or seek emergency care.    I spent 35 minutes of dedicated to the care of this patient on the date of this encounter to include pre-visit review of records, face-to-face time with the patient discussing conditions above, post visit ordering of testing, clinical documentation with the electronic health record, making appropriate referrals as documented, and communicating necessary findings to members of the patients care team.     C. Leita Sanders, MD Advanced Bronchoscopy PCCM [facility]    *This note was generated using voice recognition software/Dragon and/or AI transcription program.  Despite best efforts to proofread, errors can occur which can change the meaning. Any transcriptional errors that result from this process are unintentional and may not be fully corrected  at the time of dictation.     [1]  Allergies Allergen Reactions   Advair Hfa [Fluticasone -Salmeterol] Rash    Rash and thrush   Anoro Ellipta  [Umeclidinium-Vilanterol] Rash    Causes patients tongue to break out   Biaxin [Clarithromycin] Nausea And Vomiting and Rash   Oxycodone  Rash  [2]  Current Meds  Medication Sig   albuterol  (PROVENTIL ) (2.5 MG/3ML) 0.083% nebulizer solution Take 3 mLs (2.5 mg total) by nebulization every 6 (six) hours as needed for wheezing or shortness of breath.   albuterol  (VENTOLIN  HFA) 108 (90 Base) MCG/ACT inhaler Inhale 2 puffs into the lungs every 6 (six) hours as needed for wheezing or shortness of breath.   amoxicillin -clavulanate (AUGMENTIN ) 875-125 MG tablet Take 1 tablet by mouth 2 (two) times daily for 7 days.   aspirin EC 81 MG tablet Take 81 mg by mouth daily. Swallow  whole.   atorvastatin  (LIPITOR) 80 MG tablet Take 1 tablet (80 mg total) by mouth daily.   benzonatate  (TESSALON ) 100 MG capsule Take 1 capsule (100 mg total) by mouth 2 (two) times daily as needed for cough.   budesonide -glycopyrrolate -formoterol  (BREZTRI  AEROSPHERE) 160-9-4.8 MCG/ACT AERO inhaler Inhale 2 puffs into the lungs in the morning and at bedtime.   celecoxib (CELEBREX) 200 MG capsule Take 200 mg by mouth 2 (two) times daily as needed for moderate pain (pain score 4-6).   cetirizine  (ZYRTEC ) 10 MG tablet Take 1 tablet (10 mg total) by mouth daily.   diclofenac  (VOLTAREN ) 75 MG EC tablet Take 1 tablet (75 mg total) by mouth 2 (two) times daily.   diclofenac  Sodium (VOLTAREN ) 1 % GEL Apply 2 g topically daily as needed (knee pain).   ezetimibe  (ZETIA ) 10 MG tablet Take 1 tablet (10 mg total) by mouth daily.   fluticasone  (FLONASE ) 50 MCG/ACT nasal spray Place 2 sprays into both nostrils daily as needed for allergies.   linaclotide  (LINZESS ) 290 MCG CAPS capsule Take 1 capsule (290 mcg total) by mouth daily before breakfast.   lisinopril  (ZESTRIL ) 40 MG tablet Take 1 tablet (40 mg total) by mouth daily.   ondansetron  (ZOFRAN ) 4 MG tablet Take 4 mg by mouth every 8 (eight) hours as needed.   pantoprazole  (PROTONIX ) 40 MG tablet Take 1 tablet (40 mg total) by mouth every morning.   Semaglutide , 1 MG/DOSE, 4 MG/3ML SOPN Inject 1 mg as directed once a week.   Spacer/Aero-Holding Chambers (AEROCHAMBER MV) inhaler Use as instructed   trolamine salicylate (ASPERCREME) 10 % cream Apply 1 application  topically as needed for muscle pain.   vitamin B-12 (CYANOCOBALAMIN) 100 MCG tablet Take 500 mcg by mouth daily.

## 2024-06-13 ENCOUNTER — Other Ambulatory Visit: Payer: Self-pay

## 2024-06-13 DIAGNOSIS — Z03818 Encounter for observation for suspected exposure to other biological agents ruled out: Secondary | ICD-10-CM | POA: Diagnosis not present

## 2024-06-13 DIAGNOSIS — J101 Influenza due to other identified influenza virus with other respiratory manifestations: Secondary | ICD-10-CM | POA: Diagnosis not present

## 2024-06-13 MED ORDER — BENZONATATE 200 MG PO CAPS
200.0000 mg | ORAL_CAPSULE | Freq: Three times a day (TID) | ORAL | 1 refills | Status: AC
  Filled 2024-06-13: qty 30, 10d supply, fill #0

## 2024-06-13 MED ORDER — OSELTAMIVIR PHOSPHATE 75 MG PO CAPS
75.0000 mg | ORAL_CAPSULE | Freq: Two times a day (BID) | ORAL | 0 refills | Status: AC
  Filled 2024-06-13: qty 10, 5d supply, fill #0

## 2024-06-15 ENCOUNTER — Other Ambulatory Visit: Payer: Self-pay

## 2024-06-15 ENCOUNTER — Other Ambulatory Visit: Payer: Self-pay | Admitting: Family Medicine

## 2024-06-16 ENCOUNTER — Other Ambulatory Visit: Payer: Self-pay

## 2024-06-17 ENCOUNTER — Other Ambulatory Visit: Payer: Self-pay

## 2024-06-17 MED ORDER — CETIRIZINE HCL 10 MG PO TABS
10.0000 mg | ORAL_TABLET | Freq: Every day | ORAL | 3 refills | Status: AC | PRN
  Filled 2024-06-17: qty 30, 30d supply, fill #0

## 2024-07-01 ENCOUNTER — Other Ambulatory Visit: Payer: Self-pay

## 2024-08-13 ENCOUNTER — Ambulatory Visit: Admitting: Family Medicine

## 2024-08-31 ENCOUNTER — Ambulatory Visit: Admitting: Cardiovascular Disease

## 2024-09-10 ENCOUNTER — Ambulatory Visit: Admitting: Pulmonary Disease
